# Patient Record
Sex: Male | Born: 1940 | Race: White | Hispanic: No | Marital: Married | Smoking: Former smoker
Health system: Southern US, Community
[De-identification: ages and names within clinical notes are randomized; demographics above are authoritative.]

## PROBLEM LIST (undated history)

## (undated) DIAGNOSIS — E7800 Pure hypercholesterolemia, unspecified: Secondary | ICD-10-CM

## (undated) DIAGNOSIS — E119 Type 2 diabetes mellitus without complications: Secondary | ICD-10-CM

## (undated) DIAGNOSIS — I10 Essential (primary) hypertension: Secondary | ICD-10-CM

## (undated) HISTORY — PX: EYE SURGERY: SHX253

## (undated) HISTORY — PX: CATARACT EXTRACTION: SUR2

---

## 2017-12-11 ENCOUNTER — Encounter: Payer: Self-pay | Admitting: *Deleted

## 2017-12-12 ENCOUNTER — Encounter: Payer: Self-pay | Admitting: *Deleted

## 2017-12-12 ENCOUNTER — Encounter
Admission: RE | Disposition: A | Discharge status: Home / Self Care | Payer: Self-pay | Source: Ambulatory Visit | Attending: Internal Medicine

## 2017-12-12 ENCOUNTER — Ambulatory Visit: Payer: Medicare Other | Admitting: Anesthesiology

## 2017-12-12 ENCOUNTER — Ambulatory Visit
Admission: RE | Admit: 2017-12-12 | Discharge: 2017-12-12 | Disposition: A | Discharge status: Home / Self Care | Payer: Medicare Other | Source: Ambulatory Visit | Attending: Internal Medicine | Admitting: Internal Medicine

## 2017-12-12 DIAGNOSIS — E119 Type 2 diabetes mellitus without complications: Secondary | ICD-10-CM | POA: Insufficient documentation

## 2017-12-12 DIAGNOSIS — K5790 Diverticulosis of intestine, part unspecified, without perforation or abscess without bleeding: Secondary | ICD-10-CM | POA: Insufficient documentation

## 2017-12-12 DIAGNOSIS — Z7982 Long term (current) use of aspirin: Secondary | ICD-10-CM | POA: Insufficient documentation

## 2017-12-12 DIAGNOSIS — D123 Benign neoplasm of transverse colon: Secondary | ICD-10-CM | POA: Insufficient documentation

## 2017-12-12 DIAGNOSIS — Z79899 Other long term (current) drug therapy: Secondary | ICD-10-CM | POA: Insufficient documentation

## 2017-12-12 DIAGNOSIS — Z86010 Personal history of colonic polyps: Secondary | ICD-10-CM | POA: Insufficient documentation

## 2017-12-12 DIAGNOSIS — I10 Essential (primary) hypertension: Secondary | ICD-10-CM | POA: Diagnosis not present

## 2017-12-12 DIAGNOSIS — K640 First degree hemorrhoids: Secondary | ICD-10-CM | POA: Diagnosis not present

## 2017-12-12 DIAGNOSIS — E7800 Pure hypercholesterolemia, unspecified: Secondary | ICD-10-CM | POA: Diagnosis not present

## 2017-12-12 DIAGNOSIS — Z1211 Encounter for screening for malignant neoplasm of colon: Secondary | ICD-10-CM | POA: Insufficient documentation

## 2017-12-12 DIAGNOSIS — Z7984 Long term (current) use of oral hypoglycemic drugs: Secondary | ICD-10-CM | POA: Insufficient documentation

## 2017-12-12 HISTORY — DX: Type 2 diabetes mellitus without complications: E11.9

## 2017-12-12 HISTORY — DX: Pure hypercholesterolemia, unspecified: E78.00

## 2017-12-12 HISTORY — PX: COLONOSCOPY WITH PROPOFOL: SHX5780

## 2017-12-12 HISTORY — DX: Essential (primary) hypertension: I10

## 2017-12-12 LAB — GLUCOSE, CAPILLARY: Glucose-Capillary: 160 mg/dL — ABNORMAL HIGH (ref 65–99)

## 2017-12-12 SURGERY — COLONOSCOPY WITH PROPOFOL
Anesthesia: General

## 2017-12-12 MED ORDER — PROPOFOL 500 MG/50ML IV EMUL
INTRAVENOUS | Status: DC | PRN
Start: 2017-12-12 — End: 2017-12-12
  Administered 2017-12-12: 200 ug/kg/min via INTRAVENOUS

## 2017-12-12 MED ORDER — SODIUM CHLORIDE 0.9 % IV SOLN
INTRAVENOUS | Status: DC
  Administered 2017-12-12: 09:00:00 via INTRAVENOUS

## 2017-12-12 MED ORDER — PHENYLEPHRINE HCL 10 MG/ML IJ SOLN
INTRAMUSCULAR | Status: DC | PRN
  Administered 2017-12-12: 200 ug via INTRAVENOUS

## 2017-12-12 MED ORDER — SODIUM CHLORIDE 0.9 % IV SOLN
INTRAVENOUS | Status: DC | PRN
  Administered 2017-12-12: 09:00:00 via INTRAVENOUS

## 2017-12-12 MED ORDER — PROPOFOL 10 MG/ML IV BOLUS
INTRAVENOUS | Status: DC | PRN
  Administered 2017-12-12: 100 mg via INTRAVENOUS

## 2017-12-12 MED ORDER — PROPOFOL 10 MG/ML IV BOLUS
INTRAVENOUS | Status: AC
  Filled 2017-12-12: qty 40

## 2017-12-12 MED ORDER — LIDOCAINE HCL (CARDIAC) PF 100 MG/5ML IV SOSY
PREFILLED_SYRINGE | INTRAVENOUS | Status: DC | PRN
  Administered 2017-12-12: 8 mg via INTRAVENOUS

## 2017-12-12 MED ORDER — EPHEDRINE SULFATE 50 MG/ML IJ SOLN
INTRAMUSCULAR | Status: DC | PRN
  Administered 2017-12-12: 10 mg via INTRAVENOUS

## 2017-12-12 NOTE — Interval H&P Note (Signed)
History and Physical Interval Note:  12/12/2017 9:01 AM  Michael Cohen  has presented today for surgery, with the diagnosis of [place]  The various methods of treatment have been discussed with the patient and family. After consideration of risks, benefits and other options for treatment, the patient has consented to  Procedure(s): COLONOSCOPY WITH PROPOFOL (N/A) as a surgical intervention .  The patient's history has been reviewed, patient examined, no change in status, stable for surgery.  I have reviewed the patient's chart and labs.  Questions were answered to the patient's satisfaction.     [place], [place]

## 2017-12-12 NOTE — H&P (Signed)
Outpatient short stay form Pre-procedure 12/12/2017 8:58 AM Teodoro K. Alice Reichert, M.D.  Primary Physician: Frazier Richards, M.D.  Reason for visit:  Personal hx of colon polyps.  History of present illness:  Patient presents for colon  polyp surveillance. The patient denies abdominal pain, abnormal weight loss or rectal bleeding.    No current facility-administered medications for this encounter.   Medications Prior to Admission  Medication Sig Dispense Refill Last Dose  . acetaminophen (TYLENOL) 325 MG tablet Take 650 mg by mouth every 6 (six) hours as needed.   Past Week at Unknown time  . aspirin EC 81 MG tablet Take 81 mg by mouth daily.   Past Week at Unknown time  . atorvastatin (LIPITOR) 40 MG tablet Take 40 mg by mouth daily.   Past Week at Unknown time  . cetirizine (ZYRTEC) 10 MG tablet Take 10 mg by mouth daily.   Past Week at Unknown time  . cholecalciferol (VITAMIN D) 400 units TABS tablet Take 2,000 Units by mouth daily.   Past Week at Unknown time  . lisinopril-hydrochlorothiazide (PRINZIDE,ZESTORETIC) 10-12.5 MG tablet Take 1 tablet by mouth daily.   Past Week at Unknown time  . metFORMIN (GLUCOPHAGE-XR) 500 MG 24 hr tablet Take 500 mg by mouth daily with breakfast.   Past Week at Unknown time  . Multiple Vitamin (MULTIVITAMIN) capsule Take 1 capsule by mouth daily.   Past Week at Unknown time  . multivitamin-lutein (OCUVITE-LUTEIN) CAPS capsule Take 6 capsules by mouth daily.   Past Week at Unknown time  . Omega-3 Fatty Acids (FISH OIL) 1000 MG CAPS Take 1,000 mg by mouth 1 day or 1 dose.   Past Week at Unknown time  . omeprazole (PRILOSEC) 20 MG capsule Take 20 mg by mouth daily.   Past Week at Unknown time  . sitaGLIPtin (JANUVIA) 100 MG tablet Take 50 mg by mouth daily.   12/12/2017 at 0630  . UNABLE TO FIND Take by mouth once.        No Known Allergies   Past Medical History:  Diagnosis Date  . Diabetes mellitus without complication ([place])   . Hypercholesteremia    . Hypertension     Review of systems:   Otherwise negative.   Physical Exam  Gen: Alert, oriented. Appears stated age.  HEENT: [place]/AT. PERRLA. Lungs: CTA, no wheezes. CV: RR nl S1, S2. Abd: soft, benign, no masses. BS+ Ext: No edema. Pulses 2+    Planned procedures: Proceed with colonoscopy. The patient understands the nature of the planned procedure, indications, risks, alternatives and potential complications including but not limited to bleeding, infection, perforation, damage to internal organs and possible oversedation/side effects from anesthesia. The patient agrees and gives consent to proceed.  Please refer to procedure notes for findings, recommendations and patient disposition/instructions.    Teodoro K. Alice Reichert, M.D. Gastroenterology 12/12/2017  8:58 AM

## 2017-12-12 NOTE — Anesthesia Post-op Follow-up Note (Signed)
Anesthesia QCDR form completed.        

## 2017-12-12 NOTE — Op Note (Signed)
[facility] Gastroenterology Patient Name: Michael Cohen Procedure Date: 12/12/2017 8:50 AM MRN: [id number] Account #: [id number] Date of Birth: 02/23/1941 Admit Type: Outpatient Age: 77 Room: [facility] ENDO ROOM 2 Gender: Male Note Status: Finalized Procedure:            Colonoscopy Indications:          Surveillance: Personal history of adenomatous polyps on                        last colonoscopy > 5 years ago Providers:            Lorie Apley K. [name] MD, MD Medicines:            Propofol per Anesthesia Complications:        No immediate complications. Procedure:            Pre-Anesthesia Assessment:                       - The risks and benefits of the procedure and the                        sedation options and risks were discussed with the                        patient. All questions were answered and informed                        consent was obtained.                       - Patient identification and proposed procedure were                        verified prior to the procedure by the nurse. The                        procedure was verified in the procedure room.                       - ASA Grade Assessment: III - A patient with severe                        systemic disease.                       - After reviewing the risks and benefits, the patient                        was deemed in satisfactory condition to undergo the                        procedure.                       After obtaining informed consent, the colonoscope was                        passed under direct vision. Throughout the procedure,                        the patient's blood pressure, pulse, and oxygen  saturations were monitored continuously. The                        Colonoscope was introduced through the anus and                        advanced to the the cecum, identified by appendiceal                        orifice and ileocecal valve. The colonoscopy was                         performed without difficulty. The patient tolerated the                        procedure well. The quality of the bowel preparation                        was good. The ileocecal valve, appendiceal orifice, and                        rectum were photographed. Findings:      The perianal and digital rectal examinations were normal. Pertinent       negatives include normal sphincter tone and no palpable rectal lesions.      Multiple small and large-mouthed diverticula were found in the entire       colon. There was no evidence of diverticular bleeding.      A 3 mm polyp was found in the transverse colon. The polyp was sessile.       The polyp was removed with a jumbo cold forceps. Resection and retrieval       were complete.      A 6 mm polyp was found in the transverse colon. The polyp was       semi-pedunculated. The polyp was removed with a hot snare. Resection and       retrieval were complete.      Non-bleeding internal hemorrhoids were found during retroflexion. The       hemorrhoids were Grade I (internal hemorrhoids that do not prolapse).      The exam was otherwise without abnormality. Impression:           - Moderate diverticulosis in the entire examined colon.                        There was no evidence of diverticular bleeding.                       - One 3 mm polyp in the transverse colon, removed with                        a jumbo cold forceps. Resected and retrieved.                       - One 6 mm polyp in the transverse colon, removed with                        a hot snare. Resected and retrieved.                       -  Non-bleeding internal hemorrhoids.                       - The examination was otherwise normal. Recommendation:       - Patient has a contact number available for                        emergencies. The signs and symptoms of potential                        delayed complications were discussed with the patient.                         Return to normal activities tomorrow. Written discharge                        instructions were provided to the patient.                       - Resume previous diet.                       - Continue present medications.                       - No repeat colonoscopy due to age.                       - The findings and recommendations were discussed with                        the patient and their spouse. Procedure Code(s):    --- Professional ---                       [phone], Colonoscopy, flexible; with removal of tumor(s),                        polyp(s), or other lesion(s) by snare technique                       45380, 58, Colonoscopy, flexible; with biopsy, single                        or multiple Diagnosis Code(s):    --- Professional ---                       K57.30, Diverticulosis of large intestine without                        perforation or abscess without bleeding                       D12.3, Benign neoplasm of transverse colon (hepatic                        flexure or splenic flexure)                       K64.0, First degree hemorrhoids                       Z86.010, Personal history of colonic polyps  CPT copyright 2017 American Medical Association. All rights reserved. The codes documented in this report are preliminary and upon coder review may  be revised to meet current compliance requirements. Efrain Sella MD, MD 12/12/2017 9:52:03 AM This report has been signed electronically. Number of Addenda: 0 Note Initiated On: 12/12/2017 8:50 AM Scope Withdrawal Time: 0 hours 6 minutes 26 seconds  Total Procedure Duration: 0 hours 12 minutes 49 seconds       [facility]

## 2017-12-12 NOTE — Transfer of Care (Signed)
Immediate Anesthesia Transfer of Care Note  Patient: Michael Cohen  Procedure(s) Performed: COLONOSCOPY WITH PROPOFOL (N/A )  Patient Location: PACU  Anesthesia Type:General  Level of Consciousness: awake, alert  and oriented  Airway & Oxygen Therapy: Patient Spontanous Breathing and Patient connected to nasal cannula oxygen  Post-op Assessment: Report given to RN and Post -op Vital signs reviewed and stable  Post vital signs: Reviewed and stable  Last Vitals:  Vitals Value Taken Time  BP 104/55 12/12/2017  9:51 AM  Temp    Pulse 65 12/12/2017  9:51 AM  Resp 19 12/12/2017  9:51 AM  SpO2 96 % 12/12/2017  9:51 AM  Vitals shown include unvalidated device data.  Last Pain:  Vitals:   12/12/17 0855  TempSrc: Tympanic  PainSc: 0-No pain         Complications: No apparent anesthesia complications

## 2017-12-12 NOTE — Anesthesia Preprocedure Evaluation (Signed)
  Anesthesia Plan  ASA: III  Anesthesia Plan: General   Post-op Pain Management:    Induction:   PONV Risk Score and Plan:   Airway Management Planned:   Additional Equipment:   Intra-op Plan:   Post-operative Plan:   Informed Consent: I have reviewed the patients History and Physical, chart, labs and discussed the procedure including the risks, benefits and alternatives for the proposed anesthesia with the patient or authorized representative who has indicated his/her understanding and acceptance.     Dental Advisory Given  Plan Discussed with: CRNA  Anesthesia Plan Comments:         Anesthesia Quick Evaluation  

## 2017-12-12 NOTE — Anesthesia Postprocedure Evaluation (Signed)
Anesthesia Post Note  Patient: Michael Cohen  Procedure(s) Performed: COLONOSCOPY WITH PROPOFOL (N/A )  Patient location during evaluation: PACU Anesthesia Type: General Level of consciousness: awake and alert Pain management: pain level controlled Vital Signs Assessment: post-procedure vital signs reviewed and stable Respiratory status: spontaneous breathing, nonlabored ventilation, respiratory function stable and patient connected to nasal cannula oxygen Cardiovascular status: blood pressure returned to baseline and stable Postop Assessment: no apparent nausea or vomiting Anesthetic complications: no     Last Vitals:  Vitals:   12/12/17 1010 12/12/17 1020  BP: 95/63 94/64  Pulse: 68 62  Resp: 15 18  Temp:    SpO2: 93% 96%    Last Pain:  Vitals:   12/12/17 0940  TempSrc: Tympanic  PainSc:                  Molli Barrows

## 2017-12-13 ENCOUNTER — Encounter: Payer: Self-pay | Admitting: Internal Medicine

## 2017-12-13 LAB — SURGICAL PATHOLOGY

## 2020-01-14 ENCOUNTER — Ambulatory Visit: Payer: Medicare Other | Attending: Internal Medicine

## 2020-01-14 ENCOUNTER — Encounter: Payer: Self-pay | Admitting: Physical Therapy

## 2020-01-14 ENCOUNTER — Other Ambulatory Visit: Payer: Self-pay

## 2020-01-14 DIAGNOSIS — M25551 Pain in right hip: Secondary | ICD-10-CM | POA: Insufficient documentation

## 2020-01-14 NOTE — Therapy (Signed)
[place] MAIN [facility] SERVICES [address] [place], [place], [address] Phone: [phone]   Fax:  [phone]  Physical Therapy Evaluation  Patient Details  Name: Michael Cohen MRN: [id number] Date of Birth: 1940-09-18 Referring Provider (PT): Dr. Ouida Sills   Encounter Date: 01/14/2020   PT End of Session - 01/14/20 1452    Visit Number 1    Number of Visits 1    Date for PT Re-Evaluation 01/21/20    Authorization Type One time visit, eval only on 01/14/20    PT Start Time 1350    PT Stop Time 1440    PT Time Calculation (min) 50 min    Activity Tolerance Patient tolerated treatment well    Behavior During Therapy [facility] for tasks assessed/performed           Past Medical History:  Diagnosis Date  . Diabetes mellitus without complication ([place])   . Hypercholesteremia   . Hypertension     Past Surgical History:  Procedure Laterality Date  . CATARACT EXTRACTION Bilateral   . COLONOSCOPY WITH PROPOFOL N/A 12/12/2017   Procedure: COLONOSCOPY WITH PROPOFOL;  Surgeon: [name], [name], MD;  Location: [facility] ENDOSCOPY;  Service: Gastroenterology;  Laterality: N/A;  . EYE SURGERY      There were no vitals filed for this visit.    Subjective Assessment - 01/14/20 1448    Subjective R hip pain    Pertinent History Pt reports that 2-3 months ago he started having posterolateral R hip pain. He saw his PCP who encouraged him to take 2 Tylenol and 2 Ibuprofen twice per day for a couple days. He had plain films of his R hip and pt states that they were normal however they are not available for therapist to review at this time. His hip pain improved significantly a few days after seeing his PCP and has continued to improve. However, he continues to have some mild pain in the morning. He also reports some R calf tightness/pain in the morning on the medial side down to his posterior medial malleolus. He has a history of what sounds like plantar fasica  injury over 20 years ago.    Diagnostic tests Plain films which pt reports were WNL however not available for therapist to review    Patient Stated Goals Decrease R hip pain    Currently in Pain? No/denies   Worst: 6/10, Best: 0/10   Pain Score 0-No pain    Pain Location Hip    Pain Orientation Right;Posterior;Lateral    Pain Descriptors / Indicators --   "intense stiffness"   Pain Type Chronic pain    Pain Radiating Towards None    Pain Onset More than a month ago    Pain Frequency Intermittent    Aggravating Factors  Hurts more in the morning after sleeping    Pain Relieving Factors Improved as day progresses and after walking. Has improved since pt returned to group exercise classes at [place] Chief complaint:  R hip pain Referring Dx: Pt reports that 2-3 months ago he started having posterolateral R hip pain. He saw his PCP who encouraged him to take 2 Tylenol and 2 Ibuprofen twice per day for a couple days. He had plain films of his R hip and pt states that they were normal however they are not available for therapist to review at this time. His hip pain improved  significantly a few days after seeing his PCP and has continued to improve. However, he continues to have some mild pain in the morning. He also reports some R calf tightness/pain in the morning on the medial side down to his posterior medial malleolus. He has a history of what sounds like plantar fasica injury over 20 years ago.  MD: Dr. Ouida Sills Pain: 0/10 Present, 0/10 Best, 6/10 Worst: Aggravating factors: Unknown Easing factors: Improves as day progresses following activity 24 hour pain behavior: Increased pain in the morning Prior history of hip injury or pain: No, he also denies history of back pain; Pain quality: pain quality: "Intense stiffness" Sleeping position: Stomach or L side Radiating pain: No  Numbness/Tingling: No Dominant hand: right Hobbies: Walking the dog, playing on  the computer Goals: Improve pain Red flags (bowel/bladder changes, saddle paresthesia, personal history of cancer, chills/fever, night sweats, unrelenting pain); Negative    OBJECTIVE  Mental Status Patient is oriented to person, place and time.  Recent memory is intact.  Remote memory is intact.  Attention span and concentration are intact.  Expressive speech is intact.  Patient's fund of knowledge is within normal limits for educational level.  SENSATION: Pt denies any numbness tingling in BLE   MUSCULOSKELETAL: Tremor: None Bulk: Normal Tone: Normal  Posture Lumbar lordosis: Flat low back Lumbar lateral shift: negative  Palpation No pain to palpation at lateral and posterior R hip. Pt localizes pain to just superior to greater trochanter of R hip but it has never been painful to palpation ;   Strength (out of 5) R/L 4+/4+ Hip flexion 4+/4+ Hip ER 4+ (stretch in posterior R hip)/4+ Hip IR 4-/4- Hip abduction 4/4 Hip adduction 4-/4- Hip extension 5/5 Knee extension 5/5 Knee flexion 5/5 Ankle dorsiflexion *Indicates pain   AROM (degrees) R/L (all movements include overpressure unless otherwise stated) Lumbar forward flexion (65): mild/mod limitation Lumbar extension (30): mod limitation Lumbar lateral flexion (25): R: mod limitation  L: mod limitation Thoracic and Lumbar rotation (30 degrees): Mild limitation bilaterally Hip IR (0-45): Mild limitation bil Hip ER (0-45): Mild limitation bil Hip Flexion (0-125): WNL and painfree bilaterally *Indicates pain  Repeated Movements No centralization or peripheralization of symptoms with repeated lumbar extension or flexion.    Muscle Length Hamstrings: Grossly 55-60 degrees bilaterally, no reproduction of pain;     SPECIAL TESTS Lumbar Radiculopathy and Discogenic: Centralization and Peripheralization (SN 92, -LR 0.12): Negative Slump (SN 83, -LR 0.32): R: Negative L: Negative SLR (SN 92, -LR 0.29): R:  Negative L:  Negative Crossed SLR (SP 90): R: Negative L: Negative  Facet Joint: Extension-Rotation (SN 100, -LR 0.0): R: Negative L: Negative  Lumbar Spinal Stenosis: Lumbar quadrant (SN 70): R: Negative L: Negative  Hip: FABER (SN 81): R: Positive L: Negative FADIR (SN 94): R: Positive L: Negative Hip scour (SN 50): R: Negative L: Negative  SIJ:  Thigh Thrust (SN 88, -LR 0.18) : R: Not done L: Not done  Piriformis Syndrome: FAIR Test (SN 88, SP 83): R: Negative L: Not examined  Forward Step-Down Test: R: Not examined L: Not examined Lateral Step-Down Test: R: Not examined L: Not examined Deep Squat: Not examined          Objective measurements completed on examination: See above findings.               PT Education - 01/14/20 1452    Education Details Plan of care and HEP    Person(s) Educated Patient  Methods Explanation;Demonstration;Handout    Comprehension Verbalized understanding               PT Long Term Goals - 01/14/20 1502      PT LONG TERM GOAL #1   Title Pt will be independent with HEP in order to improve strength and decrease R hip pain in order to improve pain-free function at home and with leisure activities    Time 1    Period Weeks    Status Achieved    Target Date 01/21/20                  Plan - 01/14/20 1443    Clinical Impression Statement Pt is a pleasant 79 year-old male referred for R hip pain. Pt reports significant resolution of pain following his PCP visit and after 2 days of Tylenol/Ibuprofen combination. He has also started back with his group exercise classes at [facility] which has helped significantly. Examination today is relatively benign with some mild reproduction of pain in R hip in both FABER and FADIR positions. Lumbar screen is clear and no radicular symptoms present. Pt is not painful to palpation of hip. He reports his pain occurs near the superior border of the greater trochanter and has some  weakness of bilateral hips in abduction, adduction, and extension. Pain is likely related to some form of gluteal tendinopathy/trochanteric bursitis. Pt provided with gentle stretches today for hip IR/ER as well as light isometric strengthening. Encouraged pillow between the knees during L sidelying position at night. Encouraged continued conditioning and flexibility training in group settings at [facility]. Pt does not feel it necessary to continue with formal PT and he will be discharged on this date to continue independently. If symptoms worsen or adequate resolution not achieved he will return for additional therapy.    Personal Factors and Comorbidities Time since onset of injury/illness/exacerbation;Comorbidity 1    Comorbidities DM    Examination-Activity Limitations Locomotion Level    Examination-Participation Restrictions Community Activity;Other   Exercise   Stability/Clinical Decision Making Stable/Uncomplicated    Clinical Decision Making Low    Rehab Potential Excellent    PT Frequency One time visit    PT Duration Other (comment)   1 week   PT Treatment/Interventions Aquatic Therapy;Canalith Repostioning;Cryotherapy;Electrical Stimulation;Iontophoresis 4mg /ml Dexamethasone;Moist Heat;Traction;Ultrasound;DME Instruction;Gait training;Stair training;Functional mobility training;Therapeutic activities;Therapeutic exercise;Balance training;Neuromuscular re-education;Patient/family education;Manual techniques;Passive range of motion;Spinal Manipulations;Joint Manipulations    PT Next Visit Plan Discharge    PT Home Exercise Plan Medbridge Access Code: [email or web address]    Consulted and Agree with Plan of Care Patient           Patient will benefit from skilled therapeutic intervention in order to improve the following deficits and impairments:  Pain  Visit Diagnosis: Pain in right hip - Plan: PT plan of care cert/re-cert     Problem List There are no problems to display for this  patient.  Phillips Grout PT, DPT, GCS  Jasneet Schobert 01/14/2020, 3:07 PM  [place] MAIN [facility] SERVICES [address] [place], [place], [address] Phone: [phone]   Fax:  [phone]  Name: Michael Cohen MRN: [id number] Date of Birth: December 21, 1940

## 2020-01-14 NOTE — Patient Instructions (Signed)
Access Code: [email or web address] URL: [email or web address] Date: 01/14/2020 Prepared by: Roxana Hires  Exercises Seated Figure 4 Piriformis Stretch - 2 x daily - 7 x weekly - 3 reps - 30-45s hold Seated Piriformis Stretch - 2 x daily - 7 x weekly - 3 reps - 30-45s hold Hooklying Bilateral Isometric Clamshell - 2 x daily - 7 x weekly - 3 reps - 45s hold

## 2020-07-06 ENCOUNTER — Ambulatory Visit: Payer: Medicare Other | Attending: Podiatry | Admitting: Physical Therapy

## 2020-07-06 ENCOUNTER — Other Ambulatory Visit: Payer: Self-pay

## 2020-07-06 VITALS — BP 121/61 | HR 88

## 2020-07-06 DIAGNOSIS — M25551 Pain in right hip: Secondary | ICD-10-CM | POA: Diagnosis present

## 2020-07-06 DIAGNOSIS — M6281 Muscle weakness (generalized): Secondary | ICD-10-CM

## 2020-07-06 DIAGNOSIS — R2689 Other abnormalities of gait and mobility: Secondary | ICD-10-CM

## 2020-07-06 NOTE — Therapy (Signed)
[place] MAIN [facility] SERVICES [address] [place], [place], [address] Phone: [phone]   Fax:  [phone]  Physical Therapy Evaluation  Patient Details  Name: Travers Goodley MRN: [id number] Date of Birth: [birth year] Referring Provider (PT): [name]   Encounter Date: 07/06/2020   PT End of Session - 07/06/20 1503    Visit Number 1    Number of Visits 17    Date for PT Re-Evaluation 08/31/20    Authorization Type eval: 07/06/20    PT Start Time 1430    PT Stop Time 1530    PT Time Calculation (min) 60 min    Activity Tolerance Patient tolerated treatment well    Behavior During Therapy [facility] for tasks assessed/performed           Past Medical History:  Diagnosis Date  . Diabetes mellitus without complication ([place])   . Hypercholesteremia   . Hypertension     Past Surgical History:  Procedure Laterality Date  . CATARACT EXTRACTION Bilateral   . COLONOSCOPY WITH PROPOFOL N/A 12/12/2017   Procedure: COLONOSCOPY WITH PROPOFOL;  Surgeon: [name], [name], MD;  Location: [facility] ENDOSCOPY;  Service: Gastroenterology;  Laterality: N/A;  . EYE SURGERY      Vitals:   07/06/20 1454  BP: 121/61  Pulse: 88  SpO2: 96%      Subjective Assessment - 07/06/20 1441    Subjective Patient reports of hip feeling worse in the morning. He always takes a hot shower to loosen up the hip before going with his day.    Pertinent History 79 y.o. 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 His pain began around a month ago and is located over the proximal lateral thigh and in the right buttocks. He describes his pain as achy more than sharp. He is not sure of any aggravating activities. He reports of having some pain at rest today. He reports associated pain or "tightness" felt along anterior and lateral portions of the lower leg. He was previously evaluated by [name] in podiatry and  diagnosed with tibialis tendinitis. He denies associated groin pain, denies radicular symptoms. He has tried Baclofen without any relief of his symptoms. Of note, the patient states that he was scheduled to go on a cruise this week but was canceled due to the pain he is experiencing. He states that he would be unable to walk around and participate fully in his vacation given his pain. An anteroposterior view of the pelvis and anteroposterior and lateral views of the right hip were obtained. Images reveal mild loss of femoral acetabular joint space with mild osteophyte formation noted. CAM deformity of the right hip noted. No fractures or dislocations. Anteroposterior, oblique, and lateral views of the lumbar spine were obtained. Images reveal no scoliosis. Mild loss of lumbar lordosis noted. Significant loss of disc space noted at L4-L5 and L5-S1. Intervertebral osteophyte formation of L4 and L5. No fractures or wedging noted. Calcification of the abdominal aorta noted. Degeneration of the lumbar facets noted. Patient was recently diagnosed with lumbar radiculoapthy due to symptoms being more consistent vs hip pathology given the absence of hip symptoms with provocative maneuvers. Prednisone tapers have been considered but is on hold for now due to patient's diabetes.    Limitations Walking;Standing;Lifting;Sitting    How long can you sit comfortably? Few hours    How long can you stand comfortably? 30 mins  How long can you walk comfortably? 1 hour    Currently in Pain? Yes    Pain Score 3     Pain Location Leg    Pain Orientation Right    Pain Descriptors / Indicators Aching    Pain Type Chronic pain    Pain Radiating Towards medial lower leg    Pain Onset More than a month ago    Pain Frequency Constant    Pain Relieving Factors Gabapentin, heat              [facility] PT Assessment - 07/06/20 1448      Assessment   Medical Diagnosis Lumbar radiculopathy    Referring Provider (PT) [name]     Hand Dominance Right      Precautions   Precautions None      Restrictions   Weight Bearing Restrictions No    Other Position/Activity Restrictions lower extremity function      Balance Screen   Has the patient fallen in the past 6 months No    Has the patient had a decrease in activity level because of a fear of falling?  Yes    Is the patient reluctant to leave their home because of a fear of falling?  Yes      [place] Private residence    Living Arrangements Spouse/significant other    Available Help at Discharge Family;Friend(s)    Type of [place] Level entry    [place];Able to live on main level with bedroom/bathroom    Home Equipment Grab bars - tub/shower      Prior Function   Level of Independence Independent      Observation/Other Assessments   Focus on Therapeutic Outcomes (FOTO)  57    Other Surveys  Lower Extremity Functional Scale    Lower Extremity Functional Scale  50/80      Transfers   Five time sit to stand comments  17.65      6 Minute Walk- Baseline   6 Minute Walk- Baseline yes    BP (mmHg) 121/61    HR (bpm) 88    02 Sat (%RA) 96 %      6 Minute walk- Post Test   6 Minute Walk Post Test yes    BP (mmHg) 127/62    HR (bpm) 88    02 Sat (%RA) 95 %    Perceived Rate of Exertion (Borg) 11- Fairly light      6 minute walk test results    Aerobic Endurance Distance Walked 500      Balance   Balance Assessed Yes      Standardized Balance Assessment   Standardized Balance Assessment Timed Up and Go Test;Five Times Sit to Stand      Timed Up and Go Test   Normal TUG (seconds) 16.78            SUBJECTIVE Chief complaint: Right hip stiffness and pain Referring Dx: Lumbar radiculopathy Referring Provider: [name] Pain location: R greater trochanter radiating to anterior medial tibia Pain quality: aching Radiating pain: Yes Numbness/Tingling:  No How long can you sit: 3-4 hours How long can you stand: 30-45 min How long can you walk: 1 hour History of back injury, pain, surgery, or therapy: No Follow-up appointment with MD: Not known yet Dominant hand: Right Imaging: Yes; (+) CAM deformity, signficant loss of disc space noted at L4-L5 and L5-S1  Falls in the last 6 months: No Occupational demands: Retired [employer] flags (bowel/bladder changes, saddle paresthesia, personal history of cancer, chills/fever, night sweats, unrelenting pain, first onset of insidious LBP <20 y/o) Negative    OBJECTIVE  Mental Status Patient is oriented to person, place and time.  Recent memory is intact.  Remote memory is intact.  Attention span and concentration are intact.  Expressive speech is intact.  Patient's fund of knowledge is within normal limits for educational level.  SENSATION: Grossly intact to light touch bilateral LEs as determined by testing dermatomes L2-S2 Proprioception and hot/cold testing deferred on this date   MUSCULOSKELETAL: Tremor: None Bulk: Normal Tone: Normal    Strength (out of 5) R/L 4/5 Hip flexion 4-/5 Hip ER 4-/5 Hip IR 4-/5 Hip abduction 4-/5 Hip adduction 3+/5 Hip extension 4+/5 Knee extension 4+/5 Knee flexion 4+/5 Ankle dorsiflexion 4+/5 Ankle plantarflexion    AROM (degrees) All AROM were WNL of BLE.    Repeated Movements No centralization or peripheralization of symptoms with repeated lumbar extension or flexion.     Outcome Measures LEFS: 50/80 TUG: 16.78 seconds 5STS: 17.65 seconds 6MWT: 500 ft  FOTO: 57/100   ASSESSMENT 79 y.o. male presents to skilled physical therapy referred for signs and symptoms consistent with lumbar radiculopathy with imaging to confirm diagnosis. PT examination reveals deficits in balance, strength, and mobility as evidenced by TUG score of 16.78, 4/5 on MMT scale when testing RLE strength, and ambulating 500 ft during the six minute walk test. Patient  presents with deficits in strength, mobility, range of motion, and pain. Patient will benefit from skilled PT services to address deficits and return to pain-free function at home.       PT Short Term Goals - 07/06/20 1736      PT SHORT TERM GOAL #1   Title Patient will be independent in home exercise program to improve strength/mobility for better functional independence with ADLs.    Time 4    Period Weeks    Status New    Target Date 08/03/20      PT SHORT TERM GOAL #2   Title Patient will increase RLE gross strength to 4+/5 as to improve functional strength for independent gait, increased standing tolerance and increased ADL ability.    Baseline 12/21: RLE gross strength 4/5    Time 4    Period Weeks    Status New    Target Date 08/03/20             PT Long Term Goals - 07/06/20 1738      PT LONG TERM GOAL #1   Title Patient will increase FOTO score to equal to or greater than 67 to demonstrate statistically significant improvement in mobility and quality of life.    Baseline 12/21: 57    Time 8    Period Weeks    Status New    Target Date 08/31/20      PT LONG TERM GOAL #2   Title Patient (> 63 years old) will complete five times sit to stand test in < 15 seconds indicating an increased LE strength and improved balance.    Baseline 12/21: 17.65s    Time 8    Period Weeks    Status New    Target Date 08/31/20      PT LONG TERM GOAL #3   Title Patient will increase six minute walk test distance to >1000 for progression to community ambulator and improve gait ability  Baseline 12/21: 500    Time 8    Period Weeks    Status New    Target Date 08/31/20      PT LONG TERM GOAL #4   Title Patient will reduce timed up and go to <11 seconds to reduce fall risk and demonstrate improved transfer/gait ability.    Baseline 12/21: 16.78s    Time 8    Period Weeks    Status New    Target Date 08/31/20      PT LONG TERM GOAL #5   Title Patient will increase lower  extremity functional scale to >60/80 to demonstrate improved functional mobility and increased tolerance with ADLs.    Baseline 12/21: 50/80    Time 8    Period Weeks    Status New    Target Date 08/31/20                  Plan - 07/06/20 1506    Clinical Impression Statement 79 y.o. male presents to skilled physical therapy referred for signs and symptoms consistent with lumbar radiculopathy with imaging to confirm diagnosis. PT examination reveals deficits in balance, strength, and mobility as evidenced by TUG score of 16.78, 4/5 on MMT scale when testing RLE strength, and ambulating 500 ft during the six minute walk test. Patient presents with deficits in strength, mobility, range of motion, and pain. Patient will benefit from skilled PT services to address deficits and return to pain-free function at home.    Personal Factors and Comorbidities Comorbidity 3+;Time since onset of injury/illness/exacerbation    Comorbidities diabetes, hypertension, hyperlipidemia    Examination-Activity Limitations Squat;Stairs;Transfers    Examination-Participation Restrictions Church    Stability/Clinical Decision Making Evolving/Moderate complexity    Clinical Decision Making Moderate    Rehab Potential Fair    PT Frequency 2x / week    PT Duration 8 weeks    PT Treatment/Interventions Moist Heat;Electrical Stimulation;Gait training;Stair training;Functional mobility training;Therapeutic activities;Therapeutic exercise;Balance training;Neuromuscular re-education;Patient/family education;Manual techniques;Passive range of motion;Energy conservation    PT Next Visit Plan Initiate BLE strengthening and balance exercises    PT Home Exercise Plan HEP    Consulted and Agree with Plan of Care Patient           Patient will benefit from skilled therapeutic intervention in order to improve the following deficits and impairments:  Abnormal gait,Decreased endurance,Impaired sensation,Decreased activity  tolerance,Decreased strength,Decreased balance,Decreased mobility,Difficulty walking,Decreased range of motion,Decreased safety awareness,Improper body mechanics  Visit Diagnosis: Muscle weakness (generalized)  Pain in right hip  Other abnormalities of gait and mobility     Problem List There are no problems to display for this patient.  Karl Luke PT, DPT Onalee Hua Suszanne Conners 07/06/2020, 6:05 PM  [place] MAIN [facility] SERVICES [address] [place], [place], [address] Phone: [phone]   Fax:  [phone]  Name: Briant Angelillo MRN: [id number] Date of Birth: 05-Jul-1941

## 2020-07-12 ENCOUNTER — Other Ambulatory Visit: Payer: Self-pay

## 2020-07-12 ENCOUNTER — Ambulatory Visit: Payer: Medicare Other | Admitting: Physical Therapy

## 2020-07-12 DIAGNOSIS — M6281 Muscle weakness (generalized): Secondary | ICD-10-CM

## 2020-07-12 DIAGNOSIS — R2689 Other abnormalities of gait and mobility: Secondary | ICD-10-CM

## 2020-07-12 DIAGNOSIS — M25551 Pain in right hip: Secondary | ICD-10-CM

## 2020-07-12 NOTE — Therapy (Signed)
[place] [facility] MAIN [facility] SERVICES [address] [place], [place], [address] Phone: [phone]   Fax:  [phone]  Physical Therapy Treatment  Patient Details  Name: Michael Cohen MRN: [id number] Date of Birth: [birth year] Referring Provider (PT): Dr. Ether Griffins   Encounter Date: 07/12/2020   PT End of Session - 07/12/20 1545    Visit Number 2    Number of Visits 17    Date for PT Re-Evaluation 08/31/20    Authorization Type eval: 07/06/20    PT Start Time 1600    PT Stop Time 1645    PT Time Calculation (min) 45 min    Activity Tolerance Patient tolerated treatment well    Behavior During Therapy [facility] for tasks assessed/performed           Past Medical History:  Diagnosis Date   Diabetes mellitus without complication ([facility])    Hypercholesteremia    Hypertension     Past Surgical History:  Procedure Laterality Date   CATARACT EXTRACTION Bilateral    COLONOSCOPY WITH PROPOFOL N/A 12/12/2017   Procedure: COLONOSCOPY WITH PROPOFOL;  Surgeon: [name], Boykin Nearing, MD;  Location: [facility] ENDOSCOPY;  Service: Gastroenterology;  Laterality: N/A;   EYE SURGERY      There were no vitals filed for this visit.     Subjective Assessment - 07/12/20 1602    Subjective Patient denies of any new symptoms or falls since last therapy session.    Pertinent History 79 y.o. 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 His pain began around a month ago and is located over the proximal lateral thigh and in the right buttocks. He describes his pain as achy more than sharp. He is not sure of any aggravating activities. He reports of having some pain at rest today. He reports associated pain or "tightness" felt along anterior and lateral portions of the lower leg. He was previously evaluated by Dr. Ether Griffins in podiatry and diagnosed with tibialis tendinitis. He denies associated groin pain, denies  radicular symptoms. He has tried Baclofen without any relief of his symptoms. Of note, the patient states that he was scheduled to go on a cruise this week but was canceled due to the pain he is experiencing. He states that he would be unable to walk around and participate fully in his vacation given his pain. An anteroposterior view of the pelvis and anteroposterior and lateral views of the right hip were obtained. Images reveal mild loss of femoral acetabular joint space with mild osteophyte formation noted. CAM deformity of the right hip noted. No fractures or dislocations. Anteroposterior, oblique, and lateral views of the lumbar spine were obtained. Images reveal no scoliosis. Mild loss of lumbar lordosis noted. Significant loss of disc space noted at L4-L5 and L5-S1. Intervertebral osteophyte formation of L4 and L5. No fractures or wedging noted. Calcification of the abdominal aorta noted. Degeneration of the lumbar facets noted. Patient was recently diagnosed with lumbar radiculoapthy due to symptoms being more consistent vs hip pathology given the absence of hip symptoms with provocative maneuvers. Prednisone tapers have been considered but is on hold for now due to patient's diabetes.    Limitations Walking;Standing;Lifting;Sitting    How long can you sit comfortably? Few hours    How long can you stand comfortably? 30 mins    How long can you walk comfortably? 1 hour    Currently in Pain? Yes    Pain Score  2     Pain Location Hip    Pain Orientation Right    Pain Descriptors / Indicators Aching    Pain Onset More than a month ago          NuStep x L1 x arms 9, chair 9 x 5 mins   All exercises were completed with 2#:    Supine SLR hip flexion 2 x 10 BLE; Supine straight leg hip abduction 2 x 10 BLE   Hooklying marching 2 x 10 BLE; Hooklying clams x 20 BLE,  Hooklying adductor squeeze 2 x 10 BLE, Hooklying bridges with arms across chest 2 x 10;   Seated LAQ x 20 BLE; Seated  marches x 20 BLE;  Precor BLE leg press 40# 2 x 15;  Standing hip strengthening with 2# ankle weights: Hip flexion marches x 20 BLE; HS curls x 20 BLE; Hip abduction x 20 BLE; Hip extension x 20 BLE;  Standing heel raises with BUE support x 20 BLE;  Sit to stand from regular height chair without UE support x 7, x 5         PT Short Term Goals - 07/06/20 1736      PT SHORT TERM GOAL #1   Title Patient will be independent in home exercise program to improve strength/mobility for better functional independence with ADLs.    Time 4    Period Weeks    Status New    Target Date 08/03/20      PT SHORT TERM GOAL #2   Title Patient will increase RLE gross strength to 4+/5 as to improve functional strength for independent gait, increased standing tolerance and increased ADL ability.    Baseline 12/21: RLE gross strength 4/5    Time 4    Period Weeks    Status New    Target Date 08/03/20             PT Long Term Goals - 07/06/20 1738      PT LONG TERM GOAL #1   Title Patient will increase FOTO score to equal to or greater than 67 to demonstrate statistically significant improvement in mobility and quality of life.    Baseline 12/21: 57    Time 8    Period Weeks    Status New    Target Date 08/31/20      PT LONG TERM GOAL #2   Title Patient (> 41 years old) will complete five times sit to stand test in < 15 seconds indicating an increased LE strength and improved balance.    Baseline 12/21: 17.65s    Time 8    Period Weeks    Status New    Target Date 08/31/20      PT LONG TERM GOAL #3   Title Patient will increase six minute walk test distance to >1000 for progression to community ambulator and improve gait ability    Baseline 12/21: 500    Time 8    Period Weeks    Status New    Target Date 08/31/20      PT LONG TERM GOAL #4   Title Patient will reduce timed up and go to <11 seconds to reduce fall risk and demonstrate improved transfer/gait ability.     Baseline 12/21: 16.78s    Time 8    Period Weeks    Status New    Target Date 08/31/20      PT LONG TERM GOAL #5   Title Patient will increase  lower extremity functional scale to >60/80 to demonstrate improved functional mobility and increased tolerance with ADLs.    Baseline 12/21: 50/80    Time 8    Period Weeks    Status New    Target Date 08/31/20                 Plan - 07/12/20 1738    Clinical Impression Statement Patient completed strengthening exercises with good tolerance to activity. He reports of his right knee pain radiating down to his medial arch in the foot after completing seated LAQ. Therapist provided verbal cues to maintain spine in netural when completing standing hip exercises. Patient will benefit from skilled PT services to increase BLE strength, balance skills, and mobility to improve patient's quality of life.    Personal Factors and Comorbidities Comorbidity 3+;Time since onset of injury/illness/exacerbation    Comorbidities diabetes, hypertension, hyperlipidemia    Examination-Activity Limitations Squat;Stairs;Transfers    Examination-Participation Restrictions Church    Stability/Clinical Decision Making Evolving/Moderate complexity    Rehab Potential Fair    PT Frequency 2x / week    PT Duration 8 weeks    PT Treatment/Interventions Moist Heat;Electrical Stimulation;Gait training;Stair training;Functional mobility training;Therapeutic activities;Therapeutic exercise;Balance training;Neuromuscular re-education;Patient/family education;Manual techniques;Passive range of motion;Energy conservation    PT Next Visit Plan Initiate balance exercises    PT Home Exercise Plan HEP    Consulted and Agree with Plan of Care Patient           Patient will benefit from skilled therapeutic intervention in order to improve the following deficits and impairments:  Abnormal gait,Decreased endurance,Impaired sensation,Decreased activity tolerance,Decreased  strength,Decreased balance,Decreased mobility,Difficulty walking,Decreased range of motion,Decreased safety awareness,Improper body mechanics  Visit Diagnosis: Muscle weakness (generalized)  Pain in right hip  Other abnormalities of gait and mobility     Problem List There are no problems to display for this patient.  Karl Luke PT, DPT Onalee Hua Suszanne Conners 07/12/2020, 5:44 PM  [place] MAIN [facility] SERVICES [address] [place], [place], [address] Phone: [phone]   Fax:  [phone]  Name: Michael Cohen MRN: [id number] Date of Birth: 1940/09/12

## 2020-07-14 ENCOUNTER — Other Ambulatory Visit: Payer: Self-pay

## 2020-07-14 ENCOUNTER — Ambulatory Visit: Payer: Medicare Other | Admitting: Physical Therapy

## 2020-07-14 DIAGNOSIS — M25551 Pain in right hip: Secondary | ICD-10-CM

## 2020-07-14 DIAGNOSIS — M6281 Muscle weakness (generalized): Secondary | ICD-10-CM

## 2020-07-14 DIAGNOSIS — R2689 Other abnormalities of gait and mobility: Secondary | ICD-10-CM

## 2020-07-14 NOTE — Therapy (Signed)
[place] MAIN [facility] SERVICES [address] [place], [place], [address] Phone: [phone]   Fax:  [phone]  Physical Therapy Treatment  Patient Details  Name: Michael Cohen MRN: [id number] Date of Birth: February 20, 1941 Referring Provider (PT): [name]   Encounter Date: 07/14/2020   PT End of Session - 07/14/20 1518    Visit Number 3    Number of Visits 17    Date for PT Re-Evaluation 08/31/20    Authorization Type eval: 07/06/20    PT Start Time 1515    PT Stop Time 1600    PT Time Calculation (min) 45 min    Activity Tolerance Patient tolerated treatment well    Behavior During Therapy [facility] for tasks assessed/performed           Past Medical History:  Diagnosis Date  . Diabetes mellitus without complication ([place])   . Hypercholesteremia   . Hypertension     Past Surgical History:  Procedure Laterality Date  . CATARACT EXTRACTION Bilateral   . COLONOSCOPY WITH PROPOFOL N/A 12/12/2017   Procedure: COLONOSCOPY WITH PROPOFOL;  Surgeon: [name], [name], MD;  Location: [facility] ENDOSCOPY;  Service: Gastroenterology;  Laterality: N/A;  . EYE SURGERY      There were no vitals filed for this visit.   Subjective Assessment - 07/14/20 1715    Subjective Patient reports of minor soreness after last therapy session which was resolved the next day. He denies of any falls or injuries.    Pertinent History 79 y.o. 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 His pain began around a month ago and is located over the proximal lateral thigh and in the right buttocks. He describes his pain as achy more than sharp. He is not sure of any aggravating activities. He reports of having some pain at rest today. He reports associated pain or "tightness" felt along anterior and lateral portions of the lower leg. He was previously evaluated by [name] in podiatry and diagnosed with tibialis  tendinitis. He denies associated groin pain, denies radicular symptoms. He has tried Baclofen without any relief of his symptoms. Of note, the patient states that he was scheduled to go on a cruise this week but was canceled due to the pain he is experiencing. He states that he would be unable to walk around and participate fully in his vacation given his pain. An anteroposterior view of the pelvis and anteroposterior and lateral views of the right hip were obtained. Images reveal mild loss of femoral acetabular joint space with mild osteophyte formation noted. CAM deformity of the right hip noted. No fractures or dislocations. Anteroposterior, oblique, and lateral views of the lumbar spine were obtained. Images reveal no scoliosis. Mild loss of lumbar lordosis noted. Significant loss of disc space noted at L4-L5 and L5-S1. Intervertebral osteophyte formation of L4 and L5. No fractures or wedging noted. Calcification of the abdominal aorta noted. Degeneration of the lumbar facets noted. Patient was recently diagnosed with lumbar radiculoapthy due to symptoms being more consistent vs hip pathology given the absence of hip symptoms with provocative maneuvers. Prednisone tapers have been considered but is on hold for now due to patient's diabetes.    Limitations Walking;Standing;Lifting;Sitting    How long can you sit comfortably? Few hours    How long can you stand comfortably? 30 mins    How long can you walk comfortably? 1 hour    Currently  in Pain? No/denies    Pain Onset More than a month ago           NuStep x L1 x arms 9, chair 9 x 5 mins   All exercises were completed with 2.5#:    Seated LAQ x 30 BLE; Seated marches x 30 BLE;  Standing hip strengthening with 2.5# ankle weights: Hip flexion marches x 20 BLE; HS curls x 20 BLE; Hip abduction x 20 BLE; Hip extension x 20 BLE;  Standing heel raises with BUE support x 20 BLE;  Sit to stand from regular height chair without UE support x10,  x 7, x 5  Precor BLE leg press 40# 2 x 15;    Airex x WBOS x EO/EC x 3 30s without BUE support Airex x NBOS x EO/EC x 3 30s without BUE support Airex step ups x 10 reps each leg        PT Short Term Goals - 07/06/20 1736      PT SHORT TERM GOAL #1   Title Patient will be independent in home exercise program to improve strength/mobility for better functional independence with ADLs.    Time 4    Period Weeks    Status New    Target Date 08/03/20      PT SHORT TERM GOAL #2   Title Patient will increase RLE gross strength to 4+/5 as to improve functional strength for independent gait, increased standing tolerance and increased ADL ability.    Baseline 12/21: RLE gross strength 4/5    Time 4    Period Weeks    Status New    Target Date 08/03/20             PT Long Term Goals - 07/06/20 1738      PT LONG TERM GOAL #1   Title Patient will increase FOTO score to equal to or greater than 67 to demonstrate statistically significant improvement in mobility and quality of life.    Baseline 12/21: 57    Time 8    Period Weeks    Status New    Target Date 08/31/20      PT LONG TERM GOAL #2   Title Patient (> 45 years old) will complete five times sit to stand test in < 15 seconds indicating an increased LE strength and improved balance.    Baseline 12/21: 17.65s    Time 8    Period Weeks    Status New    Target Date 08/31/20      PT LONG TERM GOAL #3   Title Patient will increase six minute walk test distance to >1000 for progression to community ambulator and improve gait ability    Baseline 12/21: 500    Time 8    Period Weeks    Status New    Target Date 08/31/20      PT LONG TERM GOAL #4   Title Patient will reduce timed up and go to <11 seconds to reduce fall risk and demonstrate improved transfer/gait ability.    Baseline 12/21: 16.78s    Time 8    Period Weeks    Status New    Target Date 08/31/20      PT LONG TERM GOAL #5   Title Patient will increase  lower extremity functional scale to >60/80 to demonstrate improved functional mobility and increased tolerance with ADLs.    Baseline 12/21: 50/80    Time 8    Period Weeks  Status New    Target Date 08/31/20                 Plan - 07/14/20 1716    Clinical Impression Statement Patient completed strengthening exercises with fair tolerance to activity. Patient tolerated progression of ankle weights with minimal increase in pain along RLE. Patient demonstrated increased postural swaying with static unsupported airex exercises. Therapist provided verbal cues to use ankle strategy to maintain COG within BOS with fair demonstration. 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 Patient would benefit from skilled PT to incresae strength, mobility, and balance skills to improve patient's quality of life.    Personal Factors and Comorbidities Comorbidity 3+;Time since onset of injury/illness/exacerbation    Comorbidities diabetes, hypertension, hyperlipidemia    Examination-Activity Limitations Squat;Stairs;Transfers    Examination-Participation Restrictions Church    Stability/Clinical Decision Making Evolving/Moderate complexity    Rehab Potential Fair    PT Frequency 2x / week    PT Duration 8 weeks    PT Treatment/Interventions Moist Heat;Electrical Stimulation;Gait training;Stair training;Functional mobility training;Therapeutic activities;Therapeutic exercise;Balance training;Neuromuscular re-education;Patient/family education;Manual techniques;Passive range of motion;Energy conservation    PT Next Visit Plan Initiate balance exercises    Consulted and Agree with Plan of Care Patient           Patient will benefit from skilled therapeutic intervention in order to improve the following deficits and impairments:  Abnormal gait,Decreased endurance,Impaired sensation,Decreased activity tolerance,Decreased  strength,Decreased balance,Decreased mobility,Difficulty walking,Decreased range of motion,Decreased safety awareness,Improper body mechanics  Visit Diagnosis: Muscle weakness (generalized)  Pain in right hip  Other abnormalities of gait and mobility     Problem List There are no problems to display for this patient.  Karl Luke PT, DPT On[place]e Hua Suszanne Conners 07/14/2020, 5:20 PM  [place] MAIN [facility] SERVICES [address] [place], [place], [address] Phone: [phone]   Fax:  [phone]  Name: Michael Cohen MRN: [id number] Date of Birth: September 28, 1940

## 2020-07-19 ENCOUNTER — Other Ambulatory Visit: Payer: Self-pay

## 2020-07-19 ENCOUNTER — Ambulatory Visit: Payer: Medicare Other | Attending: Podiatry

## 2020-07-19 DIAGNOSIS — R2689 Other abnormalities of gait and mobility: Secondary | ICD-10-CM | POA: Diagnosis present

## 2020-07-19 DIAGNOSIS — M6281 Muscle weakness (generalized): Secondary | ICD-10-CM | POA: Diagnosis not present

## 2020-07-19 DIAGNOSIS — M25551 Pain in right hip: Secondary | ICD-10-CM | POA: Diagnosis present

## 2020-07-19 NOTE — Therapy (Signed)
[place] MAIN [facility] SERVICES [address] [place], [place], [address] Phone: [phone]   Fax:  [phone]  Physical Therapy Treatment  Patient Details  Name: Michael Cohen MRN: [id number] Date of Birth: 1941/01/01 Referring Provider (PT): [name]   Encounter Date: 07/19/2020   PT End of Session - 07/19/20 1315    Visit Number 4    Number of Visits 17    Date for PT Re-Evaluation 08/31/20    Authorization Type eval: 07/06/20    PT Start Time 0110    PT Stop Time 0152    PT Time Calculation (min) 42 min    Equipment Utilized During Treatment Gait belt    Activity Tolerance Patient tolerated treatment well    Behavior During Therapy [facility] for tasks assessed/performed           Past Medical History:  Diagnosis Date  . Diabetes mellitus without complication ([place])   . Hypercholesteremia   . Hypertension     Past Surgical History:  Procedure Laterality Date  . CATARACT EXTRACTION Bilateral   . COLONOSCOPY WITH PROPOFOL N/A 12/12/2017   Procedure: COLONOSCOPY WITH PROPOFOL;  Surgeon: [name], [name], MD;  Location: [facility] ENDOSCOPY;  Service: Gastroenterology;  Laterality: N/A;  . EYE SURGERY      There were no vitals filed for this visit.   Subjective Assessment - 07/19/20 1316    Subjective The patient  reports that he does ok but has pain in his right thigh and this morning had cramping in the arch of his right foot.    Pertinent History 80 y.o. 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 His pain began around a month ago and is located over the proximal lateral thigh and in the right buttocks. He describes his pain as achy more than sharp. He is not sure of any aggravating activities. He reports of having some pain at rest today. He reports associated pain or "tightness" felt along anterior and lateral portions of the lower leg. He was previously evaluated by Dr.  [name] in podiatry and diagnosed with tibialis tendinitis. He denies associated groin pain, denies radicular symptoms. He has tried Baclofen without any relief of his symptoms. Of note, the patient states that he was scheduled to go on a cruise this week but was canceled due to the pain he is experiencing. He states that he would be unable to walk around and participate fully in his vacation given his pain. An anteroposterior view of the pelvis and anteroposterior and lateral views of the right hip were obtained. Images reveal mild loss of femoral acetabular joint space with mild osteophyte formation noted. CAM deformity of the right hip noted. No fractures or dislocations. Anteroposterior, oblique, and lateral views of the lumbar spine were obtained. Images reveal no scoliosis. Mild loss of lumbar lordosis noted. Significant loss of disc space noted at L4-L5 and L5-S1. Intervertebral osteophyte formation of L4 and L5. No fractures or wedging noted. Calcification of the abdominal aorta noted. Degeneration of the lumbar facets noted. Patient was recently diagnosed with lumbar radiculoapthy due to symptoms being more consistent vs hip pathology given the absence of hip symptoms with provocative maneuvers. Prednisone tapers have been considered but is on hold for now due to patient's diabetes.    Limitations Walking;Standing;Lifting;Sitting    How long can you sit comfortably? Few hours    How long can you stand comfortably? 30 mins  How long can you walk comfortably? 1 hour    Currently in Pain? Yes    Pain Location Leg    Pain Orientation Right;Upper    Pain Onset More than a month ago             NuStep x L1 x arms 9, chair 9 x 6 mins    All exercises were completed with 2.5#:     Seated LAQ x 30 BLE; Seated marches x 30 BLE;   Standing hip strengthening with 2.5# ankle weights: Hip flexion marches x 20 BLE; HS curls x 20 BLE; Hip abduction x 20 BLE; Hip extension x 20 BLE;   Standing  heel raises with BUE support x 20 BLE;   Sit to stand from regular height chair without UE support x10, x 7, x 5   Precor BLE leg press 40# 2 x 15;   Airex x WBOS x EO/EC x 2 30s without BUE support Airex x NBOS x EO/EC x 2 30s without BUE support Airex Tandem balance 30"x1 each without UE support [place] balance trail                            PT Education - 07/19/20 1331    Education Details exercise technique/balance    Person(s) Educated Patient    Methods Explanation    Comprehension Verbalized understanding            PT Short Term Goals - 07/06/20 1736      PT SHORT TERM GOAL #1   Title Patient will be independent in home exercise program to improve strength/mobility for better functional independence with ADLs.    Time 4    Period Weeks    Status New    Target Date 08/03/20      PT SHORT TERM GOAL #2   Title Patient will increase RLE gross strength to 4+/5 as to improve functional strength for independent gait, increased standing tolerance and increased ADL ability.    Baseline 12/21: RLE gross strength 4/5    Time 4    Period Weeks    Status New    Target Date 08/03/20             PT Long Term Goals - 07/06/20 1738      PT LONG TERM GOAL #1   Title Patient will increase FOTO score to equal to or greater than 67 to demonstrate statistically significant improvement in mobility and quality of life.    Baseline 12/21: 57    Time 8    Period Weeks    Status New    Target Date 08/31/20      PT LONG TERM GOAL #2   Title Patient (> 6 years old) will complete five times sit to stand test in < 15 seconds indicating an increased LE strength and improved balance.    Baseline 12/21: 17.65s    Time 8    Period Weeks    Status New    Target Date 08/31/20      PT LONG TERM GOAL #3   Title Patient will increase six minute walk test distance to >1000 for progression to community ambulator and improve gait ability    Baseline 12/21: 500     Time 8    Period Weeks    Status New    Target Date 08/31/20      PT LONG TERM GOAL #4   Title Patient will  reduce timed up and go to <11 seconds to reduce fall risk and demonstrate improved transfer/gait ability.    Baseline 12/21: 16.78s    Time 8    Period Weeks    Status New    Target Date 08/31/20      PT LONG TERM GOAL #5   Title Patient will increase lower extremity functional scale to >60/80 to demonstrate improved functional mobility and increased tolerance with ADLs.    Baseline 12/21: 50/80    Time 8    Period Weeks    Status New    Target Date 08/31/20                 Plan - 07/19/20 1402    Clinical Impression Statement Patient with good tolerance to exercises requiring verbal cueing for exercise technique.  The patient challenged with balance activities with mod sway on uneven surfaces.  The patient continues to benefit from additional skilled PT intervention to further improve LE strength, decrease symptoms and improve balance for improved quality of life.    Personal Factors and Comorbidities Comorbidity 3+;Time since onset of injury/illness/exacerbation    Comorbidities diabetes, hypertension, hyperlipidemia    Examination-Activity Limitations Squat;Stairs;Transfers    Examination-Participation Restrictions Church    Stability/Clinical Decision Making Evolving/Moderate complexity    Rehab Potential Fair    PT Frequency 2x / week    PT Duration 8 weeks    PT Treatment/Interventions Moist Heat;Electrical Stimulation;Gait training;Stair training;Functional mobility training;Therapeutic activities;Therapeutic exercise;Balance training;Neuromuscular re-education;Patient/family education;Manual techniques;Passive range of motion;Energy conservation    PT Next Visit Plan Initiate balance exercises    Consulted and Agree with Plan of Care Patient           Patient will benefit from skilled therapeutic intervention in order to improve the following deficits and  impairments:  Abnormal gait,Decreased endurance,Impaired sensation,Decreased activity tolerance,Decreased strength,Decreased balance,Decreased mobility,Difficulty walking,Decreased range of motion,Decreased safety awareness,Improper body mechanics  Visit Diagnosis: Muscle weakness (generalized)  Pain in right hip  Other abnormalities of gait and mobility     Problem List There are no problems to display for this patient.   Colin Mulders PT, DPT 07/19/2020, 2:08 PM  [place] [facility]IN [facility] SERVICES [address] [place], [place], [address] Phone: [phone]   Fax:  [phone]  Name: Duc Crocket MRN: [id number] Date of Birth: 27-Jan-1941

## 2020-07-21 ENCOUNTER — Ambulatory Visit: Payer: Medicare Other | Admitting: Physical Therapy

## 2020-07-21 ENCOUNTER — Other Ambulatory Visit: Payer: Self-pay

## 2020-07-21 DIAGNOSIS — M25551 Pain in right hip: Secondary | ICD-10-CM

## 2020-07-21 DIAGNOSIS — R2689 Other abnormalities of gait and mobility: Secondary | ICD-10-CM

## 2020-07-21 DIAGNOSIS — M6281 Muscle weakness (generalized): Secondary | ICD-10-CM | POA: Diagnosis not present

## 2020-07-21 NOTE — Therapy (Signed)
[place] [facility] MAIN [facility] SERVICES [address] [place], [place], [address] Phone: [phone]   Fax:  [phone]  Physical Therapy Treatment  Patient Details  Name: Michael Cohen MRN: [id number] Date of Birth: 11/01/1940 Referring Provider (PT): Dr. Ether Griffins   Encounter Date: 07/21/2020   PT End of Session - 07/21/20 1802    Visit Number 5    Number of Visits 17    Date for PT Re-Evaluation 08/31/20    Authorization Type eval: 07/06/20    PT Start Time 1515    PT Stop Time 1600    PT Time Calculation (min) 45 min    Equipment Utilized During Treatment Gait belt    Activity Tolerance Patient tolerated treatment well    Behavior During Therapy [facility] for tasks assessed/performed           Past Medical History:  Diagnosis Date  . Diabetes mellitus without complication (HCC)   . Hypercholesteremia   . Hypertension     Past Surgical History:  Procedure Laterality Date  . CATARACT EXTRACTION Bilateral   . COLONOSCOPY WITH PROPOFOL N/A 12/12/2017   Procedure: COLONOSCOPY WITH PROPOFOL;  Surgeon: [name], Boykin Nearing, MD;  Location: [facility] ENDOSCOPY;  Service: Gastroenterology;  Laterality: N/A;  . EYE SURGERY      There were no vitals filed for this visit.   Subjective Assessment - 07/21/20 1523    Subjective Patient states his symptoms on his RLE has not changed since last therapy session. He denies of any falls or injuries since last therapy session.    Pertinent History 80 y.o. 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 His pain began around a month ago and is located over the proximal lateral thigh and in the right buttocks. He describes his pain as achy more than sharp. He is not sure of any aggravating activities. He reports of having some pain at rest today. He reports associated pain or "tightness" felt along anterior and lateral portions of the lower leg. He was previously  evaluated by Dr. Ether Griffins in podiatry and diagnosed with tibialis tendinitis. He denies associated groin pain, denies radicular symptoms. He has tried Baclofen without any relief of his symptoms. Of note, the patient states that he was scheduled to go on a cruise this week but was canceled due to the pain he is experiencing. He states that he would be unable to walk around and participate fully in his vacation given his pain. An anteroposterior view of the pelvis and anteroposterior and lateral views of the right hip were obtained. Images reveal mild loss of femoral acetabular joint space with mild osteophyte formation noted. CAM deformity of the right hip noted. No fractures or dislocations. Anteroposterior, oblique, and lateral views of the lumbar spine were obtained. Images reveal no scoliosis. Mild loss of lumbar lordosis noted. Significant loss of disc space noted at L4-L5 and L5-S1. Intervertebral osteophyte formation of L4 and L5. No fractures or wedging noted. Calcification of the abdominal aorta noted. Degeneration of the lumbar facets noted. Patient was recently diagnosed with lumbar radiculoapthy due to symptoms being more consistent vs hip pathology given the absence of hip symptoms with provocative maneuvers. Prednisone tapers have been considered but is on hold for now due to patient's diabetes.    Limitations Walking;Standing;Lifting;Sitting    How long can you sit comfortably? Few hours    How long can you stand comfortably? 30 mins  How long can you walk comfortably? 1 hour    Currently in Pain? No/denies    Pain Onset More than a month ago            NuStep x L1 x arms 9, chair 9 x 6 mins    All exercises were completed with 3#:     Seated LAQ x 30 BLE; Seated marches x 30 BLE;   Precor BLE leg press 40# x 20 reps;   Kore Balance: Penguin race x 3 reps KoreMaze  Seated lower back stretch with big theraball forward/sideways x 10 10s holds         PT Short Term Goals  - 07/06/20 1736      PT SHORT TERM GOAL #1   Title Patient will be independent in home exercise program to improve strength/mobility for better functional independence with ADLs.    Time 4    Period Weeks    Status New    Target Date 08/03/20      PT SHORT TERM GOAL #2   Title Patient will increase RLE gross strength to 4+/5 as to improve functional strength for independent gait, increased standing tolerance and increased ADL ability.    Baseline 12/21: RLE gross strength 4/5    Time 4    Period Weeks    Status New    Target Date 08/03/20             PT Long Term Goals - 07/06/20 1738      PT LONG TERM GOAL #1   Title Patient will increase FOTO score to equal to or greater than 67 to demonstrate statistically significant improvement in mobility and quality of life.    Baseline 12/21: 57    Time 8    Period Weeks    Status New    Target Date 08/31/20      PT LONG TERM GOAL #2   Title Patient (> 23 years old) will complete five times sit to stand test in < 15 seconds indicating an increased LE strength and improved balance.    Baseline 12/21: 17.65s    Time 8    Period Weeks    Status New    Target Date 08/31/20      PT LONG TERM GOAL #3   Title Patient will increase six minute walk test distance to >1000 for progression to community ambulator and improve gait ability    Baseline 12/21: 500    Time 8    Period Weeks    Status New    Target Date 08/31/20      PT LONG TERM GOAL #4   Title Patient will reduce timed up and go to <11 seconds to reduce fall risk and demonstrate improved transfer/gait ability.    Baseline 12/21: 16.78s    Time 8    Period Weeks    Status New    Target Date 08/31/20      PT LONG TERM GOAL #5   Title Patient will increase lower extremity functional scale to >60/80 to demonstrate improved functional mobility and increased tolerance with ADLs.    Baseline 12/21: 50/80    Time 8    Period Weeks    Status New    Target Date 08/31/20                  Plan - 07/21/20 1802    Clinical Impression Statement Progressed patient's balance exercises with KoreBalance with fair- balance. Patient is heavily dependent  on BUE support and requires constant vebral cueing for improvement of motor control and weight shifting. Patient demonstrates slight improvement with repetition in games. Patient tolerated lower back stretches with fair tolerance and requires verbal cues for proper positioning of BLE to target Q/L. Patient would benefit from continued PT services to increase strength, mobility, and balance skills to improve patient's quality of life.    Personal Factors and Comorbidities Comorbidity 3+;Time since onset of injury/illness/exacerbation    Comorbidities diabetes, hypertension, hyperlipidemia    Examination-Activity Limitations Squat;Stairs;Transfers    Examination-Participation Restrictions Church    Stability/Clinical Decision Making Evolving/Moderate complexity    Rehab Potential Fair    PT Frequency 2x / week    PT Duration 8 weeks    PT Treatment/Interventions Moist Heat;Electrical Stimulation;Gait training;Stair training;Functional mobility training;Therapeutic activities;Therapeutic exercise;Balance training;Neuromuscular re-education;Patient/family education;Manual techniques;Passive range of motion;Energy conservation    PT Next Visit Plan Initiate balance exercises    Consulted and Agree with Plan of Care Patient           Patient will benefit from skilled therapeutic intervention in order to improve the following deficits and impairments:  Abnormal gait,Decreased endurance,Impaired sensation,Decreased activity tolerance,Decreased strength,Decreased balance,Decreased mobility,Difficulty walking,Decreased range of motion,Decreased safety awareness,Improper body mechanics  Visit Diagnosis: Muscle weakness (generalized)  Pain in right hip  Other abnormalities of gait and mobility     Problem List There  are no problems to display for this patient.  Karl Luke PT, DPT Onalee Hua Suszanne Conners 07/21/2020, 6:06 PM  [place] MAIN [facility] SERVICES [address] [place], [place], [address] Phone: [phone]   Fax:  [phone]  Name: Michael Cohen MRN: [id number] Date of Birth: 1940-12-20

## 2020-07-26 ENCOUNTER — Ambulatory Visit: Payer: Medicare Other | Admitting: Physical Therapy

## 2020-07-26 ENCOUNTER — Other Ambulatory Visit: Payer: Self-pay

## 2020-07-26 DIAGNOSIS — M6281 Muscle weakness (generalized): Secondary | ICD-10-CM

## 2020-07-26 DIAGNOSIS — M25551 Pain in right hip: Secondary | ICD-10-CM

## 2020-07-26 DIAGNOSIS — R2689 Other abnormalities of gait and mobility: Secondary | ICD-10-CM

## 2020-07-26 NOTE — Therapy (Signed)
[place] MAIN [facility] SERVICES [address] [place], [place], [address] Phone: [phone]   Fax:  [phone]  Physical Therapy Treatment  Patient Details  Name: Michael Cohen MRN: [id number] Date of Birth: [birth year] Referring Provider (PT): [name]   Encounter Date: 07/26/2020   PT End of Session - 07/26/20 1126    Visit Number 6    Number of Visits 17    Date for PT Re-Evaluation 08/31/20    Authorization Type eval: 07/06/20    PT Start Time 1100    PT Stop Time 1145    PT Time Calculation (min) 45 min    Equipment Utilized During Treatment Gait belt    Activity Tolerance Patient tolerated treatment well    Behavior During Therapy [facility] for tasks assessed/performed           Past Medical History:  Diagnosis Date  . Diabetes mellitus without complication ([place])   . Hypercholesteremia   . Hypertension     Past Surgical History:  Procedure Laterality Date  . CATARACT EXTRACTION Bilateral   . COLONOSCOPY WITH PROPOFOL N/A 12/12/2017   Procedure: COLONOSCOPY WITH PROPOFOL;  Surgeon: [name], [name], MD;  Location: [facility] ENDOSCOPY;  Service: Gastroenterology;  Laterality: N/A;  . EYE SURGERY      There were no vitals filed for this visit.   Subjective Assessment - 07/26/20 1106    Subjective Patient reports of increased stiffness in his R hip. He states when he tried to stand up from squatting to pick something up then his knees gave out and was able to stand back up with the support of a table.    Pertinent History 80 y.o. 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 His pain began around a month ago and is located over the proximal lateral thigh and in the right buttocks. He describes his pain as achy more than sharp. He is not sure of any aggravating activities. He reports of having some pain at rest today. He reports associated pain or "tightness" felt along  anterior and lateral portions of the lower leg. He was previously evaluated by [name] in podiatry and diagnosed with tibialis tendinitis. He denies associated groin pain, denies radicular symptoms. He has tried Baclofen without any relief of his symptoms. Of note, the patient states that he was scheduled to go on a cruise this week but was canceled due to the pain he is experiencing. He states that he would be unable to walk around and participate fully in his vacation given his pain. An anteroposterior view of the pelvis and anteroposterior and lateral views of the right hip were obtained. Images reveal mild loss of femoral acetabular joint space with mild osteophyte formation noted. CAM deformity of the right hip noted. No fractures or dislocations. Anteroposterior, oblique, and lateral views of the lumbar spine were obtained. Images reveal no scoliosis. Mild loss of lumbar lordosis noted. Significant loss of disc space noted at L4-L5 and L5-S1. Intervertebral osteophyte formation of L4 and L5. No fractures or wedging noted. Calcification of the abdominal aorta noted. Degeneration of the lumbar facets noted. Patient was recently diagnosed with lumbar radiculoapthy due to symptoms being more consistent vs hip pathology given the absence of hip symptoms with provocative maneuvers. Prednisone tapers have been considered but is on hold for now due to patient's diabetes.    Limitations Walking;Standing;Lifting;Sitting    How long can you sit comfortably?  Few hours    How long can you stand comfortably? 30 mins    How long can you walk comfortably? 1 hour    Currently in Pain? No/denies    Pain Onset More than a month ago           NuStep x L1 x arms 9, chair 9 x 6 mins   Supine:  HS stretch x PROM x 3 30s Figure 4 supine stretch x 3 30s  Lower trunk rotation x 10 10s holds x theraball  Double knee to chest with theraball x 10 10s holds  Bridges x 2 15 reps  Seated:  HS stretch x 3 30s    Balance:  Penguin race x 3         PT Short Term Goals - 07/06/20 1736      PT SHORT TERM GOAL #1   Title Patient will be independent in home exercise program to improve strength/mobility for better functional independence with ADLs.    Time 4    Period Weeks    Status New    Target Date 08/03/20      PT SHORT TERM GOAL #2   Title Patient will increase RLE gross strength to 4+/5 as to improve functional strength for independent gait, increased standing tolerance and increased ADL ability.    Baseline 12/21: RLE gross strength 4/5    Time 4    Period Weeks    Status New    Target Date 08/03/20             PT Long Term Goals - 07/06/20 1738      PT LONG TERM GOAL #1   Title Patient will increase FOTO score to equal to or greater than 67 to demonstrate statistically significant improvement in mobility and quality of life.    Baseline 12/21: 57    Time 8    Period Weeks    Status New    Target Date 08/31/20      PT LONG TERM GOAL #2   Title Patient (> 21 years old) will complete five times sit to stand test in < 15 seconds indicating an increased LE strength and improved balance.    Baseline 12/21: 17.65s    Time 8    Period Weeks    Status New    Target Date 08/31/20      PT LONG TERM GOAL #3   Title Patient will increase six minute walk test distance to >1000 for progression to community ambulator and improve gait ability    Baseline 12/21: 500    Time 8    Period Weeks    Status New    Target Date 08/31/20      PT LONG TERM GOAL #4   Title Patient will reduce timed up and go to <11 seconds to reduce fall risk and demonstrate improved transfer/gait ability.    Baseline 12/21: 16.78s    Time 8    Period Weeks    Status New    Target Date 08/31/20      PT LONG TERM GOAL #5   Title Patient will increase lower extremity functional scale to >60/80 to demonstrate improved functional mobility and increased tolerance with ADLs.    Baseline 12/21: 50/80     Time 8    Period Weeks    Status New    Target Date 08/31/20                 Plan - 07/26/20  1151    Clinical Impression Statement Focused on stretching to improve hip flexibility with fair tolerance to stretching. Patient demonstrates decreased hip IR/ER with poor tolerance to increased pressure to improve tissue flexibility. Patient tolerated balancing exercises with minimal posturing swaying.   Patient will continue to benefit from skilled physical therapy to improve generalized strength, ROM, and capacity for functional activity.    Personal Factors and Comorbidities Comorbidity 3+;Time since onset of injury/illness/exacerbation    Comorbidities diabetes, hypertension, hyperlipidemia    Examination-Activity Limitations Squat;Stairs;Transfers    Examination-Participation Restrictions Church    Stability/Clinical Decision Making Evolving/Moderate complexity    Rehab Potential Fair    PT Frequency 2x / week    PT Duration 8 weeks    PT Treatment/Interventions Moist Heat;Electrical Stimulation;Gait training;Stair training;Functional mobility training;Therapeutic activities;Therapeutic exercise;Balance training;Neuromuscular re-education;Patient/family education;Manual techniques;Passive range of motion;Energy conservation    PT Next Visit Plan Initiate balance exercises    PT Home Exercise Plan Hamstring stretch, piriformis stretch    Consulted and Agree with Plan of Care Patient           Patient will benefit from skilled therapeutic intervention in order to improve the following deficits and impairments:  Abnormal gait,Decreased endurance,Impaired sensation,Decreased activity tolerance,Decreased strength,Decreased balance,Decreased mobility,Difficulty walking,Decreased range of motion,Decreased safety awareness,Improper body mechanics  Visit Diagnosis: Pain in right hip  Other abnormalities of gait and mobility  Muscle weakness (generalized)     Problem List There are  no problems to display for this patient.  Karl Luke PT, DPT Onalee Hua Suszanne Conners 07/26/2020, 11:57 AM  [place] MAIN [facility] SERVICES [address] [place], [place], [address] Phone: [phone]   Fax:  [phone]  Name: Michael Cohen MRN: [id number] Date of Birth: 09-01-1940

## 2020-07-28 ENCOUNTER — Ambulatory Visit: Payer: Medicare Other

## 2020-07-28 ENCOUNTER — Other Ambulatory Visit: Payer: Self-pay

## 2020-07-28 DIAGNOSIS — R2689 Other abnormalities of gait and mobility: Secondary | ICD-10-CM

## 2020-07-28 DIAGNOSIS — M25551 Pain in right hip: Secondary | ICD-10-CM

## 2020-07-28 DIAGNOSIS — M6281 Muscle weakness (generalized): Secondary | ICD-10-CM

## 2020-07-28 NOTE — Therapy (Signed)
[place] MAIN [facility] SERVICES [address] [place], [place], [address] Phone: [phone]   Fax:  [phone]  Physical Therapy Treatment  Patient Details  Name: Michael Cohen MRN: [id number] Date of Birth: 1940-09-17 Referring Provider (PT): [name]   Encounter Date: 07/28/2020   PT End of Session - 07/28/20 1456    Visit Number 7    Number of Visits 17    Date for PT Re-Evaluation 08/31/20    Authorization Type eval: 07/06/20    PT Start Time 0145    PT Stop Time 0230    PT Time Calculation (min) 45 min    Equipment Utilized During Treatment Gait belt    Activity Tolerance Patient tolerated treatment well    Behavior During Therapy [facility] for tasks assessed/performed           Past Medical History:  Diagnosis Date  . Diabetes mellitus without complication ([place])   . Hypercholesteremia   . Hypertension     Past Surgical History:  Procedure Laterality Date  . CATARACT EXTRACTION Bilateral   . COLONOSCOPY WITH PROPOFOL N/A 12/12/2017   Procedure: COLONOSCOPY WITH PROPOFOL;  Surgeon: [name], [name], MD;  Location: [facility] ENDOSCOPY;  Service: Gastroenterology;  Laterality: N/A;  . EYE SURGERY      There were no vitals filed for this visit.   Subjective Assessment - 07/28/20 1455    Subjective The patient reports he is feeling pretty good today and has no pain.    Pertinent History 80 y.o. 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 His pain began around a month ago and is located over the proximal lateral thigh and in the right buttocks. He describes his pain as achy more than sharp. He is not sure of any aggravating activities. He reports of having some pain at rest today. He reports associated pain or "tightness" felt along anterior and lateral portions of the lower leg. He was previously evaluated by [name] in podiatry and diagnosed with tibialis tendinitis. He  denies associated groin pain, denies radicular symptoms. He has tried Baclofen without any relief of his symptoms. Of note, the patient states that he was scheduled to go on a cruise this week but was canceled due to the pain he is experiencing. He states that he would be unable to walk around and participate fully in his vacation given his pain. An anteroposterior view of the pelvis and anteroposterior and lateral views of the right hip were obtained. Images reveal mild loss of femoral acetabular joint space with mild osteophyte formation noted. CAM deformity of the right hip noted. No fractures or dislocations. Anteroposterior, oblique, and lateral views of the lumbar spine were obtained. Images reveal no scoliosis. Mild loss of lumbar lordosis noted. Significant loss of disc space noted at L4-L5 and L5-S1. Intervertebral osteophyte formation of L4 and L5. No fractures or wedging noted. Calcification of the abdominal aorta noted. Degeneration of the lumbar facets noted. Patient was recently diagnosed with lumbar radiculoapthy due to symptoms being more consistent vs hip pathology given the absence of hip symptoms with provocative maneuvers. Prednisone tapers have been considered but is on hold for now due to patient's diabetes.    Limitations Walking;Standing;Lifting;Sitting    How long can you sit comfortably? Few hours    How long can you stand comfortably? 30 mins    How long can you walk comfortably? 1 hour    Currently  in Pain? No/denies    Pain Score 0-No pain    Pain Onset More than a month ago              NuStep x L1 x arms 9, chair 9 x 6 mins    Supine:  HS stretch x PROM x 3 30s Figure 4 supine stretch x 3 30s  Lower trunk rotation x 10 10s holds x theraball  Double knee to chest with theraball x 10 10s holds  Bridges x 2 15 reps   Balance:  Penguin race- bronze race x5 mins                          PT Education - 07/28/20 1456    Education Details  exercise instruction.    Person(s) Educated Patient    Methods Explanation    Comprehension Verbalized understanding            PT Short Term Goals - 07/06/20 1736      PT SHORT TERM GOAL #1   Title Patient will be independent in home exercise program to improve strength/mobility for better functional independence with ADLs.    Time 4    Period Weeks    Status New    Target Date 08/03/20      PT SHORT TERM GOAL #2   Title Patient will increase RLE gross strength to 4+/5 as to improve functional strength for independent gait, increased standing tolerance and increased ADL ability.    Baseline 12/21: RLE gross strength 4/5    Time 4    Period Weeks    Status New    Target Date 08/03/20             PT Long Term Goals - 07/06/20 1738      PT LONG TERM GOAL #1   Title Patient will increase FOTO score to equal to or greater than 67 to demonstrate statistically significant improvement in mobility and quality of life.    Baseline 12/21: 57    Time 8    Period Weeks    Status New    Target Date 08/31/20      PT LONG TERM GOAL #2   Title Patient (> 86 years old) will complete five times sit to stand test in < 15 seconds indicating an increased LE strength and improved balance.    Baseline 12/21: 17.65s    Time 8    Period Weeks    Status New    Target Date 08/31/20      PT LONG TERM GOAL #3   Title Patient will increase six minute walk test distance to >1000 for progression to community ambulator and improve gait ability    Baseline 12/21: 500    Time 8    Period Weeks    Status New    Target Date 08/31/20      PT LONG TERM GOAL #4   Title Patient will reduce timed up and go to <11 seconds to reduce fall risk and demonstrate improved transfer/gait ability.    Baseline 12/21: 16.78s    Time 8    Period Weeks    Status New    Target Date 08/31/20      PT LONG TERM GOAL #5   Title Patient will increase lower extremity functional scale to >60/80 to demonstrate  improved functional mobility and increased tolerance with ADLs.    Baseline 12/21: 50/80    Time 8  Period Weeks    Status New    Target Date 08/31/20                 Plan - 07/28/20 1456    Clinical Impression Statement Patient with good tolerance to exercises in the clinic and was without increase in symptoms.  The patient does present with decreased flexibility of low back and hips impacting his overall symptoms.  The patient continues to benefit from additional skilled PT services to improve flexibility and LE strength for improved quality of life and return to prior level of function.    Personal Factors and Comorbidities Comorbidity 3+;Time since onset of injury/illness/exacerbation    Comorbidities diabetes, hypertension, hyperlipidemia    Examination-Activity Limitations Squat;Stairs;Transfers    Examination-Participation Restrictions Church    Stability/Clinical Decision Making Evolving/Moderate complexity    Rehab Potential Fair    PT Frequency 2x / week    PT Duration 8 weeks    PT Treatment/Interventions Moist Heat;Electrical Stimulation;Gait training;Stair training;Functional mobility training;Therapeutic activities;Therapeutic exercise;Balance training;Neuromuscular re-education;Patient/family education;Manual techniques;Passive range of motion;Energy conservation    PT Next Visit Plan Initiate balance exercises    PT Home Exercise Plan Hamstring stretch, piriformis stretch    Consulted and Agree with Plan of Care Patient           Patient will benefit from skilled therapeutic intervention in order to improve the following deficits and impairments:  Abnormal gait,Decreased endurance,Impaired sensation,Decreased activity tolerance,Decreased strength,Decreased balance,Decreased mobility,Difficulty walking,Decreased range of motion,Decreased safety awareness,Improper body mechanics  Visit Diagnosis: Pain in right hip  Other abnormalities of gait and  mobility  Muscle weakness (generalized)     Problem List There are no problems to display for this patient.   Hal Morales PT, DPT 07/28/2020, 3:00 PM  [place] MAIN [facility] SERVICES [address] [place], [place], [address] Phone: [phone]   Fax:  [phone]  Name: Michael Cohen MRN: [id number] Date of Birth: 05-01-1941

## 2020-08-04 ENCOUNTER — Ambulatory Visit: Payer: Medicare Other

## 2020-08-04 DIAGNOSIS — M25551 Pain in right hip: Secondary | ICD-10-CM

## 2020-08-04 DIAGNOSIS — M6281 Muscle weakness (generalized): Secondary | ICD-10-CM | POA: Diagnosis not present

## 2020-08-04 DIAGNOSIS — R2689 Other abnormalities of gait and mobility: Secondary | ICD-10-CM

## 2020-08-04 NOTE — Therapy (Signed)
[place] MAIN [facility] SERVICES [address] [place], [place], [address] Phone: [phone]   Fax:  [phone]  Physical Therapy Treatment  Patient Details  Name: Michael Cohen MRN: [id number] Date of Birth: 06/01/1941 Referring Provider (PT): [name]   Encounter Date: 08/04/2020   PT End of Session - 08/04/20 1543    Visit Number 8    Number of Visits 17    Date for PT Re-Evaluation 08/31/20    Authorization Type eval: 07/06/20    PT Start Time 1429    PT Stop Time 1516    PT Time Calculation (min) 47 min    Equipment Utilized During Treatment Gait belt    Activity Tolerance Patient tolerated treatment well    Behavior During Therapy [facility] for tasks assessed/performed           Past Medical History:  Diagnosis Date  . Diabetes mellitus without complication ([place])   . Hypercholesteremia   . Hypertension     Past Surgical History:  Procedure Laterality Date  . CATARACT EXTRACTION Bilateral   . COLONOSCOPY WITH PROPOFOL N/A 12/12/2017   Procedure: COLONOSCOPY WITH PROPOFOL;  Surgeon: [name], [name], MD;  Location: [facility] ENDOSCOPY;  Service: Gastroenterology;  Laterality: N/A;  . EYE SURGERY      There were no vitals filed for this visit.   Subjective Assessment - 08/04/20 1428    Subjective Pt reports no pain currently. Pt reports he did shovel snow earlier this week and that his muscles were sore afterward. Pt reports he performed heel raises in his shower with his hands on a grab bar.    Pertinent History 80 y.o. 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 His pain began around a month ago and is located over the proximal lateral thigh and in the right buttocks. He describes his pain as achy more than sharp. He is not sure of any aggravating activities. He reports of having some pain at rest today. He reports associated pain or "tightness" felt along anterior and  lateral portions of the lower leg. He was previously evaluated by [name] in podiatry and diagnosed with tibialis tendinitis. He denies associated groin pain, denies radicular symptoms. He has tried Baclofen without any relief of his symptoms. Of note, the patient states that he was scheduled to go on a cruise this week but was canceled due to the pain he is experiencing. He states that he would be unable to walk around and participate fully in his vacation given his pain. An anteroposterior view of the pelvis and anteroposterior and lateral views of the right hip were obtained. Images reveal mild loss of femoral acetabular joint space with mild osteophyte formation noted. CAM deformity of the right hip noted. No fractures or dislocations. Anteroposterior, oblique, and lateral views of the lumbar spine were obtained. Images reveal no scoliosis. Mild loss of lumbar lordosis noted. Significant loss of disc space noted at L4-L5 and L5-S1. Intervertebral osteophyte formation of L4 and L5. No fractures or wedging noted. Calcification of the abdominal aorta noted. Degeneration of the lumbar facets noted. Patient was recently diagnosed with lumbar radiculoapthy due to symptoms being more consistent vs hip pathology given the absence of hip symptoms with provocative maneuvers. Prednisone tapers have been considered but is on hold for now due to patient's diabetes.    Limitations Walking;Standing;Lifting;Sitting    How long can you sit comfortably? Few hours  How long can you stand comfortably? 30 mins    How long can you walk comfortably? 1 hour    Currently in Pain? No/denies    Pain Onset More than a month ago           Therex:  NuStep x L1x 6 mins  Seated HS stretch 1x30s B LEs, 1x60 sec B LEs Figure 4 supine stretch 2x60 sec B Les Bridges - 1x15 pt reports exercise is "easy" Staggered bridge - 2x10 each LE, VC provided  Trunk rotations - 1x20 cued to increase AROM to R  Knee to chest - 2x30  sec STS - 2x10 from mat table rates "easy," and 1x10 from standard chair height rates "medium"  Neuromuscular Re-ed: Korebalance Penguin race x 6 minutes. From B UE support to unilateral UE support with CGA       PT Education - 08/04/20 1542    Education Details Pt educated on technique with LE-staggered glute bridge.    Methods Explanation;Tactile cues;Verbal cues    Comprehension Verbalized understanding;Returned demonstration            PT Short Term Goals - 07/06/20 1736      PT SHORT TERM GOAL #1   Title Patient will be independent in home exercise program to improve strength/mobility for better functional independence with ADLs.    Time 4    Period Weeks    Status New    Target Date 08/03/20      PT SHORT TERM GOAL #2   Title Patient will increase RLE gross strength to 4+/5 as to improve functional strength for independent gait, increased standing tolerance and increased ADL ability.    Baseline 12/21: RLE gross strength 4/5    Time 4    Period Weeks    Status New    Target Date 08/03/20             PT Long Term Goals - 07/06/20 1738      PT LONG TERM GOAL #1   Title Patient will increase FOTO score to equal to or greater than 67 to demonstrate statistically significant improvement in mobility and quality of life.    Baseline 12/21: 57    Time 8    Period Weeks    Status New    Target Date 08/31/20      PT LONG TERM GOAL #2   Title Patient (> 68 years old) will complete five times sit to stand test in < 15 seconds indicating an increased LE strength and improved balance.    Baseline 12/21: 17.65s    Time 8    Period Weeks    Status New    Target Date 08/31/20      PT LONG TERM GOAL #3   Title Patient will increase six minute walk test distance to >1000 for progression to community ambulator and improve gait ability    Baseline 12/21: 500    Time 8    Period Weeks    Status New    Target Date 08/31/20      PT LONG TERM GOAL #4   Title Patient  will reduce timed up and go to <11 seconds to reduce fall risk and demonstrate improved transfer/gait ability.    Baseline 12/21: 16.78s    Time 8    Period Weeks    Status New    Target Date 08/31/20      PT LONG TERM GOAL #5   Title Patient will increase lower extremity functional  scale to >60/80 to demonstrate improved functional mobility and increased tolerance with ADLs.    Baseline 12/21: 50/80    Time 8    Period Weeks    Status New    Target Date 08/31/20                 Plan - 08/04/20 1543    Clinical Impression Statement Pt progressed on glute bridge exercise to staggered-LE glute bridge, indicating increased B LE strength. Pt with difficulty performing STS from standard chair height compared to mat table.  PT also educated pt on safety with exercises this session and instructed pt not to perform any exercises in his shower due to increased fall risk as pt had reported performing heel raises in his shower. Pt verbalized understanding. Pt will benefit from further skilled therapy to increase flexibility, B LE strength and balance to improve QOL and ease with all functional activities.    Personal Factors and Comorbidities Comorbidity 3+;Time since onset of injury/illness/exacerbation    Comorbidities diabetes, hypertension, hyperlipidemia    Examination-Activity Limitations Squat;Stairs;Transfers    Examination-Participation Restrictions Church    Stability/Clinical Decision Making Evolving/Moderate complexity    Rehab Potential Fair    PT Frequency 2x / week    PT Duration 8 weeks    PT Treatment/Interventions Moist Heat;Electrical Stimulation;Gait training;Stair training;Functional mobility training;Therapeutic activities;Therapeutic exercise;Balance training;Neuromuscular re-education;Patient/family education;Manual techniques;Passive range of motion;Energy conservation    PT Next Visit Plan Progress balance exercises and STS    PT Home Exercise Plan Hamstring  stretch, piriformis stretch    Consulted and Agree with Plan of Care Patient           Patient will benefit from skilled therapeutic intervention in order to improve the following deficits and impairments:  Abnormal gait,Decreased endurance,Impaired sensation,Decreased activity tolerance,Decreased strength,Decreased balance,Decreased mobility,Difficulty walking,Decreased range of motion,Decreased safety awareness,Improper body mechanics  Visit Diagnosis: Pain in right hip  Muscle weakness (generalized)  Other abnormalities of gait and mobility     Problem List There are no problems to display for this patient.  Ricard Dillon PT, DPT  08/04/2020, 3:54 PM  [place] MAIN [facility] SERVICES [address] [place], [place], [address] Phone: [phone]   Fax:  [phone]  Name: Michael Cohen MRN: [id number] Date of Birth: 1941/06/15

## 2020-08-09 ENCOUNTER — Ambulatory Visit: Payer: Medicare Other

## 2020-08-09 ENCOUNTER — Other Ambulatory Visit: Payer: Self-pay

## 2020-08-09 DIAGNOSIS — M25551 Pain in right hip: Secondary | ICD-10-CM

## 2020-08-09 DIAGNOSIS — R2689 Other abnormalities of gait and mobility: Secondary | ICD-10-CM

## 2020-08-09 DIAGNOSIS — M6281 Muscle weakness (generalized): Secondary | ICD-10-CM

## 2020-08-09 NOTE — Therapy (Signed)
[place] MAIN [facility] SERVICES [address] [place], [place], [address] Phone: [phone]   Fax:  [phone]  Physical Therapy Treatment  Patient Details  Name: Michael Cohen MRN: [id number] Date of Birth: 1941/02/19 Referring Provider (PT): [name]   Encounter Date: 08/09/2020   PT End of Session - 08/09/20 1442    Visit Number 9    Number of Visits 17    Date for PT Re-Evaluation 08/31/20    Authorization Type eval: 07/06/20    PT Start Time 7425    PT Stop Time 1514    PT Time Calculation (min) 40 min    Equipment Utilized During Treatment Gait belt    Activity Tolerance Patient tolerated treatment well    Behavior During Therapy [facility] for tasks assessed/performed           Past Medical History:  Diagnosis Date  . Diabetes mellitus without complication ([place])   . Hypercholesteremia   . Hypertension     Past Surgical History:  Procedure Laterality Date  . CATARACT EXTRACTION Bilateral   . COLONOSCOPY WITH PROPOFOL N/A 12/12/2017   Procedure: COLONOSCOPY WITH PROPOFOL;  Surgeon: [name], [name], MD;  Location: [facility] ENDOSCOPY;  Service: Gastroenterology;  Laterality: N/A;  . EYE SURGERY      There were no vitals filed for this visit.   Subjective Assessment - 08/09/20 1440    Subjective Pt reports hes doing generally well today, only stiff. He continues to walk the dog TID, then also doing water aerobics twice weekly, reports he has not added in HEP actiivty as well.    Pertinent History 80 y.o. 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 His pain began around a month ago and is located over the proximal lateral thigh and in the right buttocks. He describes his pain as achy more than sharp. He is not sure of any aggravating activities. He reports of having some pain at rest today. He reports associated pain or "tightness" felt along anterior and lateral portions  of the lower leg. He was previously evaluated by [name] in podiatry and diagnosed with tibialis tendinitis. He denies associated groin pain, denies radicular symptoms. He has tried Baclofen without any relief of his symptoms. Of note, the patient states that he was scheduled to go on a cruise this week but was canceled due to the pain he is experiencing. He states that he would be unable to walk around and participate fully in his vacation given his pain. An anteroposterior view of the pelvis and anteroposterior and lateral views of the right hip were obtained. Images reveal mild loss of femoral acetabular joint space with mild osteophyte formation noted. CAM deformity of the right hip noted. No fractures or dislocations. Anteroposterior, oblique, and lateral views of the lumbar spine were obtained. Images reveal no scoliosis. Mild loss of lumbar lordosis noted. Significant loss of disc space noted at L4-L5 and L5-S1. Intervertebral osteophyte formation of L4 and L5. No fractures or wedging noted. Calcification of the abdominal aorta noted. Degeneration of the lumbar facets noted. Patient was recently diagnosed with lumbar radiculoapthy due to symptoms being more consistent vs hip pathology given the absence of hip symptoms with provocative maneuvers. Prednisone tapers have been considered but is on hold for now due to patient's diabetes.    Currently in Pain? No/denies           INTERVENTION THIS DATE:  -Nustep  Seat 10, level 9, level 1, 6 minutes (AA/ROM of BLE trunk)  -RLE LAD 1x2 minutes  -STKC 2x30sec bilat  -FABER stretch 2x30sec bilat  -Supine Bridges 2x15 (only achieves 50% of full ROM or less)  -Sidelying Book openings AA/ROM 1x10 bilat  -STS 1x10 hands free  -Penguin racer to eat fish on korebalance(tm) computer module VR traininer *minguard assist with gait belt, BUE support on machine bars     PT Education - 08/09/20 1442    Education Details actiivty modification and fitting in  the HEP    Person(s) Educated Patient    Methods Explanation    Comprehension Verbalized understanding;Returned demonstration            PT Short Term Goals - 07/06/20 1736      PT SHORT TERM GOAL #1   Title Patient will be independent in home exercise program to improve strength/mobility for better functional independence with ADLs.    Time 4    Period Weeks    Status New    Target Date 08/03/20      PT SHORT TERM GOAL #2   Title Patient will increase RLE gross strength to 4+/5 as to improve functional strength for independent gait, increased standing tolerance and increased ADL ability.    Baseline 12/21: RLE gross strength 4/5    Time 4    Period Weeks    Status New    Target Date 08/03/20             PT Long Term Goals - 07/06/20 1738      PT LONG TERM GOAL #1   Title Patient will increase FOTO score to equal to or greater than 67 to demonstrate statistically significant improvement in mobility and quality of life.    Baseline 12/21: 57    Time 8    Period Weeks    Status New    Target Date 08/31/20      PT LONG TERM GOAL #2   Title Patient (> 66 years old) will complete five times sit to stand test in < 15 seconds indicating an increased LE strength and improved balance.    Baseline 12/21: 17.65s    Time 8    Period Weeks    Status New    Target Date 08/31/20      PT LONG TERM GOAL #3   Title Patient will increase six minute walk test distance to >1000 for progression to community ambulator and improve gait ability    Baseline 12/21: 500    Time 8    Period Weeks    Status New    Target Date 08/31/20      PT LONG TERM GOAL #4   Title Patient will reduce timed up and go to <11 seconds to reduce fall risk and demonstrate improved transfer/gait ability.    Baseline 12/21: 16.78s    Time 8    Period Weeks    Status New    Target Date 08/31/20      PT LONG TERM GOAL #5   Title Patient will increase lower extremity functional scale to >60/80 to  demonstrate improved functional mobility and increased tolerance with ADLs.    Baseline 12/21: 50/80    Time 8    Period Weeks    Status New    Target Date 08/31/20                 Plan - 08/09/20 1445    Clinical Impression Statement Continued with  current plan of care as laid out in evaluation and recent prior sessions. Pt remains motivated to advance progress toward goals. Rest breaks provided as needed, pt quick to ask when needed. Author maintains all interventions within appropriate level of intensity as not to purposefully exacerbate pain. Pt does require varying levels of assistance and cuing for completion of exercises for correct form and sometimes due to pain/weakness, particularly for book openings. Pt continues to demonstrate progress toward goals AEB progression of some interventions this date either in volume or intensity. No updates to HEP this date.    Personal Factors and Comorbidities Comorbidity 3+;Time since onset of injury/illness/exacerbation    Comorbidities diabetes, hypertension, hyperlipidemia    Examination-Activity Limitations Squat;Stairs;Transfers    Examination-Participation Restrictions Church    Stability/Clinical Decision Making Evolving/Moderate complexity    Clinical Decision Making Moderate    Rehab Potential Fair    PT Frequency 2x / week    PT Duration 8 weeks    PT Treatment/Interventions Moist Heat;Electrical Stimulation;Gait training;Stair training;Functional mobility training;Therapeutic activities;Therapeutic exercise;Balance training;Neuromuscular re-education;Patient/family education;Manual techniques;Passive range of motion;Energy conservation    PT Next Visit Plan Progress balance exercises and STS    PT Home Exercise Plan Hamstring stretch, piriformis stretch    Consulted and Agree with Plan of Care Patient           Patient will benefit from skilled therapeutic intervention in order to improve the following deficits and  impairments:  Abnormal gait,Decreased endurance,Impaired sensation,Decreased activity tolerance,Decreased strength,Decreased balance,Decreased mobility,Difficulty walking,Decreased range of motion,Decreased safety awareness,Improper body mechanics  Visit Diagnosis: Muscle weakness (generalized)  Pain in right hip  Other abnormalities of gait and mobility     Problem List There are no problems to display for this patient.  3:23 PM, 08/09/20 Etta Grandchild, PT, DPT Physical Therapist - [place] [phone]     Etta Grandchild 08/09/2020, 2:57 PM  [place] MAIN [facility] SERVICES [address] [place], [place], [address] Phone: [phone]   Fax:  [phone]  Name: Markey Deady MRN: [id number] Date of Birth: 25-Sep-1940

## 2020-08-11 ENCOUNTER — Ambulatory Visit: Payer: Medicare Other | Admitting: Physical Therapy

## 2020-08-11 ENCOUNTER — Other Ambulatory Visit: Payer: Self-pay

## 2020-08-11 DIAGNOSIS — R2689 Other abnormalities of gait and mobility: Secondary | ICD-10-CM

## 2020-08-11 DIAGNOSIS — M25551 Pain in right hip: Secondary | ICD-10-CM

## 2020-08-11 DIAGNOSIS — M6281 Muscle weakness (generalized): Secondary | ICD-10-CM | POA: Diagnosis not present

## 2020-08-11 NOTE — Therapy (Signed)
[place] MAIN [facility] SERVICES [address] [place], [place], [address] Phone: [phone]   Fax:  [phone]  Physical Therapy Treatment/Progress Note  Dates of reporting period  07/06/2020  to   08/11/2020   Patient Details  Name: Michael Cohen MRN: [id number] Date of Birth: Jun 22, 1941 Referring Provider (PT): [name]   Encounter Date: 08/11/2020   PT End of Session - 08/11/20 1429    Visit Number 10    Number of Visits 17    Date for PT Re-Evaluation 08/31/20    Authorization Type eval: 07/06/20    PT Start Time 1430    PT Stop Time 1515    PT Time Calculation (min) 45 min    Equipment Utilized During Treatment Gait belt    Activity Tolerance Patient tolerated treatment well    Behavior During Therapy [facility] for tasks assessed/performed           Past Medical History:  Diagnosis Date  . Diabetes mellitus without complication ([place])   . Hypercholesteremia   . Hypertension     Past Surgical History:  Procedure Laterality Date  . CATARACT EXTRACTION Bilateral   . COLONOSCOPY WITH PROPOFOL N/A 12/12/2017   Procedure: COLONOSCOPY WITH PROPOFOL;  Surgeon: [name], [name], MD;  Location: [facility] ENDOSCOPY;  Service: Gastroenterology;  Laterality: N/A;  . EYE SURGERY      There were no vitals filed for this visit.     Clinical Impression:   Patient demonstrates excellent motivation during session today. Outcome measures performed at today's therapy  session. His 5 times sit to stand decreasedsignificantly from 17.65 seconds at initial evaluation to 14.47 seconds during last visit. His 6-minute walk test also improved significantly from 500 feet with no rest breaks to 655 feet.Patient is still at a risk for falls as evidenced 12.08 seconds. Patient has yet to achieve maximal benefit from skilled PT services and will continue to benefit from additional therapy to see improvements in function. Patient will continue to benefit  from skilled physical therapy to improve generalized strength, ROM, and capacity for functional activity.       PT Short Term Goals - 08/11/20 1434      PT SHORT TERM GOAL #1   Title Patient will be independent in home exercise program to improve strength/mobility for better functional independence with ADLs.    Baseline patient is compliant with all his HEP; attending water aerobics on days he's not coming to therapy.    Time 4    Period Weeks    Status Achieved    Target Date 08/03/20      PT SHORT TERM GOAL #2   Title Patient will increase RLE gross strength to 4+/5 as to improve functional strength for independent gait, increased standing tolerance and increased ADL ability.    Baseline 12/21: RLE gross strength 4/5    Time 4    Period Weeks    Status On-going    Target Date 08/03/20             PT Long Term Goals - 08/11/20 1435      PT LONG TERM GOAL #1   Title Patient will increase FOTO score to equal to or greater than 67 to demonstrate statistically significant improvement in mobility and quality of life.    Baseline 12/21: 57    Time 8    Period Weeks    Status On-going      PT LONG TERM GOAL #2  Title Patient (> 32 years old) will complete five times sit to stand test in < 15 seconds indicating an increased LE strength and improved balance.    Baseline 12/21: 17.65s, 01/26: 14.47s    Time 8    Period Weeks    Status Achieved      PT LONG TERM GOAL #3   Title Patient will increase six minute walk test distance to >1000 for progression to community ambulator and improve gait ability    Baseline 12/21: 500, 01/26: 655 ft    Time 8    Period Weeks    Status On-going      PT LONG TERM GOAL #4   Title Patient will reduce timed up and go to <11 seconds to reduce fall risk and demonstrate improved transfer/gait ability.    Baseline 12/21: 16.78s, 01/26: 12.08s    Time 8    Period Weeks    Status On-going      PT LONG TERM GOAL #5   Title Patient will  increase lower extremity functional scale to >60/80 to demonstrate improved functional mobility and increased tolerance with ADLs.    Baseline 12/21: 50/80, 01/26: 62/80    Time 8    Period Weeks    Status Achieved                 Plan - 08/11/20 1655    Clinical Impression Statement Patient demonstrates excellent motivation during session today. Outcome measures performed at today's therapy  session. His 5 times sit to stand decreased significantly from 17.65 seconds at initial evaluation to 14.47 seconds during last visit. His 6-minute walk test also improved significantly from 500 feet with no rest breaks to 655 feet. Patient is still at a risk for falls as evidenced 12.08 seconds.  Patient has yet to achieve maximal benefit from skilled PT services and will continue to benefit from additional therapy to see improvements in function. Patient will continue to benefit from skilled physical therapy to improve generalized strength, ROM, and capacity for functional activity.    Personal Factors and Comorbidities Comorbidity 3+;Time since onset of injury/illness/exacerbation    Comorbidities diabetes, hypertension, hyperlipidemia    Examination-Activity Limitations Squat;Stairs;Transfers    Examination-Participation Restrictions Church    Stability/Clinical Decision Making Evolving/Moderate complexity    Rehab Potential Fair    PT Frequency 2x / week    PT Duration 8 weeks    PT Treatment/Interventions Moist Heat;Electrical Stimulation;Gait training;Stair training;Functional mobility training;Therapeutic activities;Therapeutic exercise;Balance training;Neuromuscular re-education;Patient/family education;Manual techniques;Passive range of motion;Energy conservation    PT Next Visit Plan Progress balance exercises and STS    PT Home Exercise Plan Hamstring stretch, piriformis stretch    Consulted and Agree with Plan of Care Patient           Patient will benefit from skilled therapeutic  intervention in order to improve the following deficits and impairments:  Abnormal gait,Decreased endurance,Impaired sensation,Decreased activity tolerance,Decreased strength,Decreased balance,Decreased mobility,Difficulty walking,Decreased range of motion,Decreased safety awareness,Improper body mechanics  Visit Diagnosis: Muscle weakness (generalized)  Pain in right hip  Other abnormalities of gait and mobility     Problem List There are no problems to display for this patient.  Karl Luke PT, DPT Onalee Hua Suszanne Conners 08/11/2020, 4:55 PM  [place] MAIN [facility] SERVICES [address] [place], [place], [address] Phone: [phone]   Fax:  [phone]  Name: Michael Cohen MRN: [id number] Date of Birth: 1940-10-17

## 2020-08-16 ENCOUNTER — Other Ambulatory Visit: Payer: Self-pay

## 2020-08-16 ENCOUNTER — Ambulatory Visit: Payer: Medicare Other | Admitting: Physical Therapy

## 2020-08-16 DIAGNOSIS — M25551 Pain in right hip: Secondary | ICD-10-CM

## 2020-08-16 DIAGNOSIS — M6281 Muscle weakness (generalized): Secondary | ICD-10-CM

## 2020-08-16 DIAGNOSIS — R2689 Other abnormalities of gait and mobility: Secondary | ICD-10-CM

## 2020-08-16 NOTE — Therapy (Signed)
[place] MAIN [facility] SERVICES [address] [place], [place], [address] Phone: [phone]   Fax:  [phone]  Physical Therapy Treatment  Patient Details  Name: Michael Cohen MRN: [id number] Date of Birth: 12/21/1940 Referring Provider (PT): [name]   Encounter Date: 08/16/2020   PT End of Session - 08/16/20 1445    Visit Number 11    Number of Visits 17    Date for PT Re-Evaluation 08/31/20    Authorization Type eval: 07/06/20    PT Start Time 1430    PT Stop Time 1515    PT Time Calculation (min) 45 min    Equipment Utilized During Treatment Gait belt    Activity Tolerance Patient tolerated treatment well    Behavior During Therapy [facility] for tasks assessed/performed           Past Medical History:  Diagnosis Date  . Diabetes mellitus without complication ([place])   . Hypercholesteremia   . Hypertension     Past Surgical History:  Procedure Laterality Date  . CATARACT EXTRACTION Bilateral   . COLONOSCOPY WITH PROPOFOL N/A 12/12/2017   Procedure: COLONOSCOPY WITH PROPOFOL;  Surgeon: [name], [name], MD;  Location: [facility] ENDOSCOPY;  Service: Gastroenterology;  Laterality: N/A;  . EYE SURGERY      There were no vitals filed for this visit.   Subjective Assessment - 08/16/20 1523    Subjective Patient reports that he is doing great today. He denies of any pain or stiffness today.    Pertinent History 80 y.o. 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 His pain began around a month ago and is located over the proximal lateral thigh and in the right buttocks. He describes his pain as achy more than sharp. He is not sure of any aggravating activities. He reports of having some pain at rest today. He reports associated pain or "tightness" felt along anterior and lateral portions of the lower leg. He was previously evaluated by [name] in podiatry and diagnosed with  tibialis tendinitis. He denies associated groin pain, denies radicular symptoms. He has tried Baclofen without any relief of his symptoms. Of note, the patient states that he was scheduled to go on a cruise this week but was canceled due to the pain he is experiencing. He states that he would be unable to walk around and participate fully in his vacation given his pain. An anteroposterior view of the pelvis and anteroposterior and lateral views of the right hip were obtained. Images reveal mild loss of femoral acetabular joint space with mild osteophyte formation noted. CAM deformity of the right hip noted. No fractures or dislocations. Anteroposterior, oblique, and lateral views of the lumbar spine were obtained. Images reveal no scoliosis. Mild loss of lumbar lordosis noted. Significant loss of disc space noted at L4-L5 and L5-S1. Intervertebral osteophyte formation of L4 and L5. No fractures or wedging noted. Calcification of the abdominal aorta noted. Degeneration of the lumbar facets noted. Patient was recently diagnosed with lumbar radiculoapthy due to symptoms being more consistent vs hip pathology given the absence of hip symptoms with provocative maneuvers. Prednisone tapers have been considered but is on hold for now due to patient's diabetes.    Currently in Pain? No/denies          Nu Step x L2 x 5 mins   There Ex:  All exercises were completed with 4# ankle weights Seated marches x  2 10 reps  Seated LAQ x 2 10 reps  Seated hip abd/add crossovers with semi foam x 2 10 reps each LE   Standing hamstring curls x 2 10 reps each LE Standing hip abduction x 2 10 reps each LE   Sit to stands with small yellow theraball (2000 gr) going up with standing x 7, x 5   Precor BLE leg press x 15 reps x 45#  Neuro Re-Ed: KoreBalance x Penguin Race x "downhill bunny"  With BUE support             PT Short Term Goals - 08/11/20 1434      PT SHORT TERM GOAL #1   Title Patient will be  independent in home exercise program to improve strength/mobility for better functional independence with ADLs.    Baseline patient is compliant with all his HEP; attending water aerobics on days he's not coming to therapy.    Time 4    Period Weeks    Status Achieved    Target Date 08/03/20      PT SHORT TERM GOAL #2   Title Patient will increase RLE gross strength to 4+/5 as to improve functional strength for independent gait, increased standing tolerance and increased ADL ability.    Baseline 12/21: RLE gross strength 4/5    Time 4    Period Weeks    Status On-going    Target Date 08/03/20             PT Long Term Goals - 08/11/20 1435      PT LONG TERM GOAL #1   Title Patient will increase FOTO score to equal to or greater than 67 to demonstrate statistically significant improvement in mobility and quality of life.    Baseline 12/21: 57    Time 8    Period Weeks    Status On-going      PT LONG TERM GOAL #2   Title Patient (> 66 years old) will complete five times sit to stand test in < 15 seconds indicating an increased LE strength and improved balance.    Baseline 12/21: 17.65s, 01/26: 14.47s    Time 8    Period Weeks    Status Achieved      PT LONG TERM GOAL #3   Title Patient will increase six minute walk test distance to >1000 for progression to community ambulator and improve gait ability    Baseline 12/21: 500, 01/26: 655 ft    Time 8    Period Weeks    Status On-going      PT LONG TERM GOAL #4   Title Patient will reduce timed up and go to <11 seconds to reduce fall risk and demonstrate improved transfer/gait ability.    Baseline 12/21: 16.78s, 01/26: 12.08s    Time 8    Period Weeks    Status On-going      PT LONG TERM GOAL #5   Title Patient will increase lower extremity functional scale to >60/80 to demonstrate improved functional mobility and increased tolerance with ADLs.    Baseline 12/21: 50/80, 01/26: 62/80    Time 8    Period Weeks    Status  Achieved                 Plan - 08/16/20 1524    Clinical Impression Statement Patient completed strengthening exercises with fair tolerance to activity. Progressed patient by increasing ankle weights today with increased muscle fatigue due to decreased functional  activity tolerance. Therapist provided verbal cues for proper positioning of hips when completing exercises to isolate hip flexors. He continues to demonstrates improved weight shifting with KoreBalance with BUE support. Patient would benefit from continued PT services to increase BLE strength and mobility to improve patient's quality of life.    Personal Factors and Comorbidities Comorbidity 3+;Time since onset of injury/illness/exacerbation    Comorbidities diabetes, hypertension, hyperlipidemia    Examination-Activity Limitations Squat;Stairs;Transfers    Examination-Participation Restrictions Church    Stability/Clinical Decision Making Evolving/Moderate complexity    Rehab Potential Fair    PT Frequency 2x / week    PT Duration 8 weeks    PT Treatment/Interventions Moist Heat;Electrical Stimulation;Gait training;Stair training;Functional mobility training;Therapeutic activities;Therapeutic exercise;Balance training;Neuromuscular re-education;Patient/family education;Manual techniques;Passive range of motion;Energy conservation    PT Next Visit Plan Progress balance exercises and STS    PT Home Exercise Plan Hamstring stretch, piriformis stretch    Consulted and Agree with Plan of Care Patient           Patient will benefit from skilled therapeutic intervention in order to improve the following deficits and impairments:  Abnormal gait,Decreased endurance,Impaired sensation,Decreased activity tolerance,Decreased strength,Decreased balance,Decreased mobility,Difficulty walking,Decreased range of motion,Decreased safety awareness,Improper body mechanics  Visit Diagnosis: Muscle weakness (generalized)  Pain in right  hip  Other abnormalities of gait and mobility     Problem List There are no problems to display for this patient.  Karl Luke PT, DPT Onalee Hua Suszanne Conners 08/16/2020, 3:39 PM  [place] MAIN [facility] SERVICES [address] [place], [place], [address] Phone: [phone]   Fax:  [phone]  Name: Michael Cohen MRN: [id number] Date of Birth: 03-Feb-1941

## 2020-08-18 ENCOUNTER — Ambulatory Visit: Payer: Medicare Other | Attending: Podiatry | Admitting: Physical Therapy

## 2020-08-18 ENCOUNTER — Other Ambulatory Visit: Payer: Self-pay

## 2020-08-18 DIAGNOSIS — M6281 Muscle weakness (generalized): Secondary | ICD-10-CM | POA: Diagnosis not present

## 2020-08-18 DIAGNOSIS — R269 Unspecified abnormalities of gait and mobility: Secondary | ICD-10-CM | POA: Insufficient documentation

## 2020-08-18 DIAGNOSIS — M25551 Pain in right hip: Secondary | ICD-10-CM | POA: Insufficient documentation

## 2020-08-18 DIAGNOSIS — R2689 Other abnormalities of gait and mobility: Secondary | ICD-10-CM | POA: Insufficient documentation

## 2020-08-18 NOTE — Therapy (Signed)
[place] MAIN [facility] SERVICES [address] [place], [place], [address] Phone: [phone]   Fax:  [phone]  Physical Therapy Treatment  Patient Details  Name: Michael Cohen MRN: [id number] Date of Birth: Apr 03, 1941 Referring Provider (PT): [name]   Encounter Date: 08/18/2020   PT End of Session - 08/18/20 1438    Visit Number 12    Number of Visits 17    Date for PT Re-Evaluation 08/31/20    Authorization Type eval: 07/06/20    PT Start Time 1430    PT Stop Time 1515    PT Time Calculation (min) 45 min    Equipment Utilized During Treatment Gait belt    Activity Tolerance Patient tolerated treatment well    Behavior During Therapy [facility] for tasks assessed/performed           Past Medical History:  Diagnosis Date  . Diabetes mellitus without complication ([place])   . Hypercholesteremia   . Hypertension     Past Surgical History:  Procedure Laterality Date  . CATARACT EXTRACTION Bilateral   . COLONOSCOPY WITH PROPOFOL N/A 12/12/2017   Procedure: COLONOSCOPY WITH PROPOFOL;  Surgeon: [name], [name], MD;  Location: [facility] ENDOSCOPY;  Service: Gastroenterology;  Laterality: N/A;  . EYE SURGERY      There were no vitals filed for this visit.   Subjective Assessment - 08/18/20 1438    Subjective Patient reports that his hips feels good today. He denies of any falls or injuries since last therapy session.    Pertinent History 80 y.o. 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 His pain began around a month ago and is located over the proximal lateral thigh and in the right buttocks. He describes his pain as achy more than sharp. He is not sure of any aggravating activities. He reports of having some pain at rest today. He reports associated pain or "tightness" felt along anterior and lateral portions of the lower leg. He was previously evaluated by [name] in podiatry  and diagnosed with tibialis tendinitis. He denies associated groin pain, denies radicular symptoms. He has tried Baclofen without any relief of his symptoms. Of note, the patient states that he was scheduled to go on a cruise this week but was canceled due to the pain he is experiencing. He states that he would be unable to walk around and participate fully in his vacation given his pain. An anteroposterior view of the pelvis and anteroposterior and lateral views of the right hip were obtained. Images reveal mild loss of femoral acetabular joint space with mild osteophyte formation noted. CAM deformity of the right hip noted. No fractures or dislocations. Anteroposterior, oblique, and lateral views of the lumbar spine were obtained. Images reveal no scoliosis. Mild loss of lumbar lordosis noted. Significant loss of disc space noted at L4-L5 and L5-S1. Intervertebral osteophyte formation of L4 and L5. No fractures or wedging noted. Calcification of the abdominal aorta noted. Degeneration of the lumbar facets noted. Patient was recently diagnosed with lumbar radiculoapthy due to symptoms being more consistent vs hip pathology given the absence of hip symptoms with provocative maneuvers. Prednisone tapers have been considered but is on hold for now due to patient's diabetes.    Limitations Walking;Standing;Lifting;Sitting    How long can you sit comfortably? Few hours    How long can you stand comfortably? 30 mins    How long can you walk comfortably?  1 hour    Currently in Pain? No/denies    Pain Onset More than a month ago           Nu Step x L2 x 5 mins  KoreBalance "downhill bunny" on TuxRace x unilateral UE support Koremaze x unilateral UE support Airex x normal stance x EO/EC x 3 30s without UE support Airex x WBOS x EO/EC x 3 30s without UE support    Step ups on stairs with BUE support x 10 reps each LE x forward/sideways  Sit to stands x 13, x 10, x 7 with small red weighted ball going up  with stands; seated rest breaks between each attempt to recover from muscle fatigue           PT Short Term Goals - 08/11/20 1434      PT SHORT TERM GOAL #1   Title Patient will be independent in home exercise program to improve strength/mobility for better functional independence with ADLs.    Baseline patient is compliant with all his HEP; attending water aerobics on days he's not coming to therapy.    Time 4    Period Weeks    Status Achieved    Target Date 08/03/20      PT SHORT TERM GOAL #2   Title Patient will increase RLE gross strength to 4+/5 as to improve functional strength for independent gait, increased standing tolerance and increased ADL ability.    Baseline 12/21: RLE gross strength 4/5    Time 4    Period Weeks    Status On-going    Target Date 08/03/20             PT Long Term Goals - 08/11/20 1435      PT LONG TERM GOAL #1   Title Patient will increase FOTO score to equal to or greater than 67 to demonstrate statistically significant improvement in mobility and quality of life.    Baseline 12/21: 57    Time 8    Period Weeks    Status On-going      PT LONG TERM GOAL #2   Title Patient (> 16 years old) will complete five times sit to stand test in < 15 seconds indicating an increased LE strength and improved balance.    Baseline 12/21: 17.65s, 01/26: 14.47s    Time 8    Period Weeks    Status Achieved      PT LONG TERM GOAL #3   Title Patient will increase six minute walk test distance to >1000 for progression to community ambulator and improve gait ability    Baseline 12/21: 500, 01/26: 655 ft    Time 8    Period Weeks    Status On-going      PT LONG TERM GOAL #4   Title Patient will reduce timed up and go to <11 seconds to reduce fall risk and demonstrate improved transfer/gait ability.    Baseline 12/21: 16.78s, 01/26: 12.08s    Time 8    Period Weeks    Status On-going      PT LONG TERM GOAL #5   Title Patient will increase lower  extremity functional scale to >60/80 to demonstrate improved functional mobility and increased tolerance with ADLs.    Baseline 12/21: 50/80, 01/26: 62/80    Time 8    Period Weeks    Status Achieved                 Plan - 08/18/20  1613    Clinical Impression Statement Patient completed strengthening and balance exercises with good tolerance to activity today. He demonstrates improvement with muscle endurance as evidenced by completing more exercises before having to sit down to recover from muscle fatigue. Patient demonstrates improvement in balance exercises with minimal to no postural swaying with Airex exercises. Patient would benefit from continued PT services to increase BLE strength and mobility to improve patient's quality of life.    Personal Factors and Comorbidities Comorbidity 3+;Time since onset of injury/illness/exacerbation    Comorbidities diabetes, hypertension, hyperlipidemia    Examination-Activity Limitations Squat;Stairs;Transfers    Examination-Participation Restrictions Church    Stability/Clinical Decision Making Evolving/Moderate complexity    Rehab Potential Fair    PT Frequency 2x / week    PT Duration 8 weeks    PT Treatment/Interventions Moist Heat;Electrical Stimulation;Gait training;Stair training;Functional mobility training;Therapeutic activities;Therapeutic exercise;Balance training;Neuromuscular re-education;Patient/family education;Manual techniques;Passive range of motion;Energy conservation    PT Next Visit Plan Progress balance exercises and STS    PT Home Exercise Plan Hamstring stretch, piriformis stretch    Consulted and Agree with Plan of Care Patient           Patient will benefit from skilled therapeutic intervention in order to improve the following deficits and impairments:  Abnormal gait,Decreased endurance,Impaired sensation,Decreased activity tolerance,Decreased strength,Decreased balance,Decreased mobility,Difficulty  walking,Decreased range of motion,Decreased safety awareness,Improper body mechanics  Visit Diagnosis: Muscle weakness (generalized)  Other abnormalities of gait and mobility  Pain in right hip     Problem List There are no problems to display for this patient.  Karl Luke PT, DPT Onalee Hua Suszanne Conners 08/18/2020, 4:20 PM  [place] MAIN [facility] SERVICES [address] [place], [place], [address] Phone: [phone]   Fax:  [phone]  Name: Kyshon Tolliver MRN: [id number] Date of Birth: 04-08-1941

## 2020-08-23 ENCOUNTER — Ambulatory Visit: Payer: Medicare Other

## 2020-08-23 ENCOUNTER — Other Ambulatory Visit: Payer: Self-pay

## 2020-08-23 DIAGNOSIS — M6281 Muscle weakness (generalized): Secondary | ICD-10-CM | POA: Diagnosis not present

## 2020-08-23 DIAGNOSIS — M25551 Pain in right hip: Secondary | ICD-10-CM

## 2020-08-23 DIAGNOSIS — R2689 Other abnormalities of gait and mobility: Secondary | ICD-10-CM

## 2020-08-23 NOTE — Therapy (Addendum)
[place] MAIN [facility] SERVICES [address] [place], [place], [address] Phone: [phone]   Fax:  [phone]  Physical Therapy Treatment  Patient Details  Name: Michael Cohen MRN: [id number] Date of Birth: May 03, 1941 Referring Provider (PT): [name]   Encounter Date: 08/23/2020    Past Medical History:  Diagnosis Date  . Diabetes mellitus without complication ([place])   . Hypercholesteremia   . Hypertension     Past Surgical History:  Procedure Laterality Date  . CATARACT EXTRACTION Bilateral   . COLONOSCOPY WITH PROPOFOL N/A 12/12/2017   Procedure: COLONOSCOPY WITH PROPOFOL;  Surgeon: [name], [name], MD;  Location: [facility] ENDOSCOPY;  Service: Gastroenterology;  Laterality: N/A;  . EYE SURGERY      There were no vitals filed for this visit.  Porfirio Mylar Step x L3 x 4 mins; for cardiovascular strength and musculoskeletal challenge   KoreBalance "downhill bunny" on TuxRace x unilateral UE support Koremaze x unilateral UE support  Matrix resisted ambulation : #7.5, 2x each side lateral stepping; close CGA   Next to support surface: airex pad: eyes closed 30 second hold airex pad: balloon taps reaching inside/outside BOS x 4 minutes  In hallway:  Horizontal head turns 86 ft with cues for direction change Sudden initiation/termination of ambulation 86 ft "red light green light"  seated: Sit to stand weighted ball (2000 gr) 10x RTB around toes, alternating march with cues for width of BOS 12x each LE          Pt educated throughout session about proper posture and technique with exercises. Improved exercise technique, movement at target joints, use of target muscles after min to mod verbal, visual, tactile cues  Patient remains highly motivated throughout physical therapy session. Patient does demonstrate lateral hip drop with prolonged ambulation and standing with fatigue. He tolerated weight shifts well with no reported  pain at this time. . Pt will benefit from further skilled therapy to increase flexibility, B LE strength and balance to improve QOL and ease with all functional activities.                        PT Short Term Goals - 08/11/20 1434      PT SHORT TERM GOAL #1   Title Patient will be independent in home exercise program to improve strength/mobility for better functional independence with ADLs.    Baseline patient is compliant with all his HEP; attending water aerobics on days he's not coming to therapy.    Time 4    Period Weeks    Status Achieved    Target Date 08/03/20      PT SHORT TERM GOAL #2   Title Patient will increase RLE gross strength to 4+/5 as to improve functional strength for independent gait, increased standing tolerance and increased ADL ability.    Baseline 12/21: RLE gross strength 4/5    Time 4    Period Weeks    Status On-going    Target Date 08/03/20             PT Long Term Goals - 08/11/20 1435      PT LONG TERM GOAL #1   Title Patient will increase FOTO score to equal to or greater than 67 to demonstrate statistically significant improvement in mobility and quality of life.    Baseline 12/21: 57    Time 8    Period Weeks    Status On-going  PT LONG TERM GOAL #2   Title Patient (> 1 years old) will complete five times sit to stand test in < 15 seconds indicating an increased LE strength and improved balance.    Baseline 12/21: 17.65s, 01/26: 14.47s    Time 8    Period Weeks    Status Achieved      PT LONG TERM GOAL #3   Title Patient will increase six minute walk test distance to >1000 for progression to community ambulator and improve gait ability    Baseline 12/21: 500, 01/26: 655 ft    Time 8    Period Weeks    Status On-going      PT LONG TERM GOAL #4   Title Patient will reduce timed up and go to <11 seconds to reduce fall risk and demonstrate improved transfer/gait ability.    Baseline 12/21: 16.78s, 01/26: 12.08s     Time 8    Period Weeks    Status On-going      PT LONG TERM GOAL #5   Title Patient will increase lower extremity functional scale to >60/80 to demonstrate improved functional mobility and increased tolerance with ADLs.    Baseline 12/21: 50/80, 01/26: 62/80    Time 8    Period Weeks    Status Achieved                  Patient will benefit from skilled therapeutic intervention in order to improve the following deficits and impairments:  Abnormal gait,Decreased endurance,Impaired sensation,Decreased activity tolerance,Decreased strength,Decreased balance,Decreased mobility,Difficulty walking,Decreased range of motion,Decreased safety awareness,Improper body mechanics  Visit Diagnosis: Muscle weakness (generalized)  Other abnormalities of gait and mobility  Pain in right hip     Problem List There are no problems to display for this patient.  Janna Arch, PT, DPT   08/25/2020, 12:15 PM  [place] MAIN [facility] SERVICES [address] [place], [place], [address] Phone: [phone]   Fax:  [phone]  Name: Haydn Hutsell MRN: [id number] Date of Birth: 09-26-1940

## 2020-08-25 ENCOUNTER — Ambulatory Visit: Payer: Medicare Other | Admitting: Physical Therapy

## 2020-08-25 ENCOUNTER — Other Ambulatory Visit: Payer: Self-pay

## 2020-08-25 DIAGNOSIS — M25551 Pain in right hip: Secondary | ICD-10-CM

## 2020-08-25 DIAGNOSIS — M6281 Muscle weakness (generalized): Secondary | ICD-10-CM

## 2020-08-25 DIAGNOSIS — R2689 Other abnormalities of gait and mobility: Secondary | ICD-10-CM

## 2020-08-25 NOTE — Therapy (Signed)
[place] MAIN [facility] SERVICES [address] [place], [place], [address] Phone: [phone]   Fax:  [phone]  Physical Therapy Treatment  Patient Details  Name: Michael Cohen MRN: [id number] Date of Birth: 1941-05-08 Referring Provider (PT): [name]   Encounter Date: 08/25/2020   PT End of Session - 08/25/20 1400    Visit Number 14    Number of Visits 17    Date for PT Re-Evaluation 08/31/20    Authorization Type eval: 07/06/20    PT Start Time 1350    PT Stop Time 1430    PT Time Calculation (min) 40 min    Equipment Utilized During Treatment Gait belt    Activity Tolerance Patient tolerated treatment well    Behavior During Therapy [facility] for tasks assessed/performed           Past Medical History:  Diagnosis Date  . Diabetes mellitus without complication ([place])   . Hypercholesteremia   . Hypertension     Past Surgical History:  Procedure Laterality Date  . CATARACT EXTRACTION Bilateral   . COLONOSCOPY WITH PROPOFOL N/A 12/12/2017   Procedure: COLONOSCOPY WITH PROPOFOL;  Surgeon: [name], [name], MD;  Location: [facility] ENDOSCOPY;  Service: Gastroenterology;  Laterality: N/A;  . EYE SURGERY      There were no vitals filed for this visit.   Subjective Assessment - 08/25/20 1359    Subjective Patient denies of any pain or falls or since last therapy session.    Pertinent History 80 y.o. 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 His pain began around a month ago and is located over the proximal lateral thigh and in the right buttocks. He describes his pain as achy more than sharp. He is not sure of any aggravating activities. He reports of having some pain at rest today. He reports associated pain or "tightness" felt along anterior and lateral portions of the lower leg. He was previously evaluated by [name] in podiatry and diagnosed with tibialis tendinitis. He  denies associated groin pain, denies radicular symptoms. He has tried Baclofen without any relief of his symptoms. Of note, the patient states that he was scheduled to go on a cruise this week but was canceled due to the pain he is experiencing. He states that he would be unable to walk around and participate fully in his vacation given his pain. An anteroposterior view of the pelvis and anteroposterior and lateral views of the right hip were obtained. Images reveal mild loss of femoral acetabular joint space with mild osteophyte formation noted. CAM deformity of the right hip noted. No fractures or dislocations. Anteroposterior, oblique, and lateral views of the lumbar spine were obtained. Images reveal no scoliosis. Mild loss of lumbar lordosis noted. Significant loss of disc space noted at L4-L5 and L5-S1. Intervertebral osteophyte formation of L4 and L5. No fractures or wedging noted. Calcification of the abdominal aorta noted. Degeneration of the lumbar facets noted. Patient was recently diagnosed with lumbar radiculoapthy due to symptoms being more consistent vs hip pathology given the absence of hip symptoms with provocative maneuvers. Prednisone tapers have been considered but is on hold for now due to patient's diabetes.    Limitations Walking;Standing;Lifting;Sitting    How long can you sit comfortably? Few hours    How long can you stand comfortably? 30 mins    How long can you walk comfortably? 1 hour    Currently in  Pain? No/denies           Nu Step x L1 x 6 mins   Step ups on stairs with BUE support x forward/sideways x 10 reps each LE   Sit to stands x 11, x 9 without BUE support  Precor BLE leg press x 55# x 15 reps   Neuro Re Ed:  Airex x normal stance x EO/EC x 3 30s without BUE support Airex x NBOS x EO/EC x 3 30s without BUE support  Airex x WBOS x EO/EC x 3 30s without BUE support   Clinical Impression: Patient completed strengthening and balance exercises with good  tolerance to activity. He demonstrates increased weight shifting to the left with balance exercises and required minA to maintain steady balance. Patient will continue to benefit from skilled physical therapy to improve generalized strength, ROM, and capacity for functional activity.      PT Short Term Goals - 08/11/20 1434      PT SHORT TERM GOAL #1   Title Patient will be independent in home exercise program to improve strength/mobility for better functional independence with ADLs.    Baseline patient is compliant with all his HEP; attending water aerobics on days he's not coming to therapy.    Time 4    Period Weeks    Status Achieved    Target Date 08/03/20      PT SHORT TERM GOAL #2   Title Patient will increase RLE gross strength to 4+/5 as to improve functional strength for independent gait, increased standing tolerance and increased ADL ability.    Baseline 12/21: RLE gross strength 4/5    Time 4    Period Weeks    Status On-going    Target Date 08/03/20             PT Long Term Goals - 08/11/20 1435      PT LONG TERM GOAL #1   Title Patient will increase FOTO score to equal to or greater than 67 to demonstrate statistically significant improvement in mobility and quality of life.    Baseline 12/21: 57    Time 8    Period Weeks    Status On-going      PT LONG TERM GOAL #2   Title Patient (> 20 years old) will complete five times sit to stand test in < 15 seconds indicating an increased LE strength and improved balance.    Baseline 12/21: 17.65s, 01/26: 14.47s    Time 8    Period Weeks    Status Achieved      PT LONG TERM GOAL #3   Title Patient will increase six minute walk test distance to >1000 for progression to community ambulator and improve gait ability    Baseline 12/21: 500, 01/26: 655 ft    Time 8    Period Weeks    Status On-going      PT LONG TERM GOAL #4   Title Patient will reduce timed up and go to <11 seconds to reduce fall risk and  demonstrate improved transfer/gait ability.    Baseline 12/21: 16.78s, 01/26: 12.08s    Time 8    Period Weeks    Status On-going      PT LONG TERM GOAL #5   Title Patient will increase lower extremity functional scale to >60/80 to demonstrate improved functional mobility and increased tolerance with ADLs.    Baseline 12/21: 50/80, 01/26: 62/80    Time 8    Period Weeks  Status Achieved                 Plan - 08/25/20 1626    Clinical Impression Statement Patient completed strengthening and balance exercises with good tolerance to activity. He demonstrates increased weight shifting to the left with balance exercises and required minA to maintain steady balance. Patient will continue to benefit from skilled physical therapy to improve generalized strength, ROM, and capacity for functional activity.    Personal Factors and Comorbidities Comorbidity 3+;Time since onset of injury/illness/exacerbation    Comorbidities diabetes, hypertension, hyperlipidemia    Examination-Activity Limitations Squat;Stairs;Transfers    Examination-Participation Restrictions [employer]    Stability/Clinical Decision Making Evolving/Moderate complexity    Rehab Potential Fair    PT Frequency 2x / week    PT Duration 8 weeks    PT Treatment/Interventions Moist Heat;Electrical Stimulation;Gait training;Stair training;Functional mobility training;Therapeutic activities;Therapeutic exercise;Balance training;Neuromuscular re-education;Patient/family education;Manual techniques;Passive range of motion;Energy conservation    PT Next Visit Plan Progress balance exercises and STS    PT Home Exercise Plan Hamstring stretch, piriformis stretch    Consulted and Agree with Plan of Care Patient           Patient will benefit from skilled therapeutic intervention in order to improve the following deficits and impairments:  Abnormal gait,Decreased endurance,Impaired sensation,Decreased activity tolerance,Decreased  strength,Decreased balance,Decreased mobility,Difficulty walking,Decreased range of motion,Decreased safety awareness,Improper body mechanics  Visit Diagnosis: Muscle weakness (generalized)  Other abnormalities of gait and mobility  Pain in right hip     Problem List There are no problems to display for this patient.  Karl Luke PT, DPT Onalee Hua Suszanne Conners 08/25/2020, 4:27 PM  [place] MAIN [facility] SERVICES [address] [place], [place], [address] Phone: [phone]   Fax:  [phone]  Name: Vernis Eid MRN: [id number] Date of Birth: 01-Dec-1940

## 2020-08-31 ENCOUNTER — Ambulatory Visit: Payer: Medicare Other

## 2020-09-02 ENCOUNTER — Other Ambulatory Visit: Payer: Self-pay

## 2020-09-02 ENCOUNTER — Ambulatory Visit: Payer: Medicare Other

## 2020-09-02 DIAGNOSIS — M6281 Muscle weakness (generalized): Secondary | ICD-10-CM | POA: Diagnosis not present

## 2020-09-02 DIAGNOSIS — R2689 Other abnormalities of gait and mobility: Secondary | ICD-10-CM

## 2020-09-02 DIAGNOSIS — M25551 Pain in right hip: Secondary | ICD-10-CM

## 2020-09-02 NOTE — Therapy (Addendum)
[place] MAIN [facility] SERVICES [address] [place], [place], [address] Phone: [phone]   Fax:  [phone]  Physical Therapy Treatment/Re-certification [id number]/[id number]  Patient Details  Name: Michael Cohen MRN: [id number] Date of Birth: [birth year] Referring Provider (PT): [name]   Encounter Date: 09/02/2020  PT End of Session - 09/02/20 1445   Visit Number 15   Number of Visits 17   Date for PT Re-Evaluation 08/31/20   Authorization Type eval: 07/06/20   PT Start Time 0230   PT Stop Time 0310   PT Time Calculation (min) 40 min   Equipment Utilized During Treatment Gait belt   Activity Tolerance Patient tolerated treatment well   Behavior During Therapy [facility] for tasks assessed/performed       Past Medical History:  Diagnosis Date  . Diabetes mellitus without complication ([place])   . Hypercholesteremia   . Hypertension     Past Surgical History:  Procedure Laterality Date  . CATARACT EXTRACTION Bilateral   . COLONOSCOPY WITH PROPOFOL N/A 12/12/2017   Procedure: COLONOSCOPY WITH PROPOFOL;  Surgeon: [name], [name], MD;  Location: [facility] ENDOSCOPY;  Service: Gastroenterology;  Laterality: N/A;  . EYE SURGERY        Subjective Assessment - 09/02/20 1631    Subjective The patient reports he is doing well has intermittent low back pain.    Pertinent History 80 y.o. 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 His pain began around a month ago and is located over the proximal lateral thigh and in the right buttocks. He describes his pain as achy more than sharp. He is not sure of any aggravating activities. He reports of having some pain at rest today. He reports associated pain or "tightness" felt along anterior and lateral portions of the lower leg. He was previously evaluated by [name] in podiatry and diagnosed with tibialis tendinitis. He denies associated groin  pain, denies radicular symptoms. He has tried Baclofen without any relief of his symptoms. Of note, the patient states that he was scheduled to go on a cruise this week but was canceled due to the pain he is experiencing. He states that he would be unable to walk around and participate fully in his vacation given his pain. An anteroposterior view of the pelvis and anteroposterior and lateral views of the right hip were obtained. Images reveal mild loss of femoral acetabular joint space with mild osteophyte formation noted. CAM deformity of the right hip noted. No fractures or dislocations. Anteroposterior, oblique, and lateral views of the lumbar spine were obtained. Images reveal no scoliosis. Mild loss of lumbar lordosis noted. Significant loss of disc space noted at L4-L5 and L5-S1. Intervertebral osteophyte formation of L4 and L5. No fractures or wedging noted. Calcification of the abdominal aorta noted. Degeneration of the lumbar facets noted. Patient was recently diagnosed with lumbar radiculoapthy due to symptoms being more consistent vs hip pathology given the absence of hip symptoms with provocative maneuvers. Prednisone tapers have been considered but is on hold for now due to patient's diabetes.    Limitations Walking;Standing;Lifting;Sitting    How long can you sit comfortably? Few hours    How long can you stand comfortably? 30 mins    How long can you walk comfortably? 1 hour    Currently in Pain? No/denies    Pain Score 0-No pain          There were no vitals  filed for this visit.    Nu Step x L1 x 6 mins    Sit to stands x20 hand support on thighs   Precor BLE leg press x 55# several reps/to fatigue   Neuro Re Ed:   Airex x NBOS x EO/EC 30s each without BUE support  Airex x WBOS x EO/EC 30s each without BUE support  Airex tandem balance EO 30"x2 each with intermittent UE support   Tux Racer Bronze Race- bilateral UE support                      PT  Short Term Goals - 09/08/20 1541      PT SHORT TERM GOAL #1   Title Patient will be independent in home exercise program to improve strength/mobility for better functional independence with ADLs.    Baseline patient is compliant with all his HEP; attending water aerobics on days he's not coming to therapy.    Time 4    Period Weeks    Status Achieved    Target Date 08/03/20      PT SHORT TERM GOAL #2   Title Patient will increase RLE gross strength to 4+/5 as to improve functional strength for independent gait, increased standing tolerance and increased ADL ability.    Baseline 12/21: RLE gross strength 4/5 2/23: see note    Time 4    Period Weeks    Status On-going    Target Date 08/03/20             PT Long Term Goals - 09/08/20 1541      PT LONG TERM GOAL #1   Title Patient will increase FOTO score to equal to or greater than 67 to demonstrate statistically significant improvement in mobility and quality of life.    Baseline 12/21: 57 2/23: 74%    Time 8    Period Weeks    Status Achieved    Target Date 10/26/20      PT LONG TERM GOAL #2   Title Patient (> 12 years old) will complete five times sit to stand test in < 15 seconds indicating an increased LE strength and improved balance.    Baseline 12/21: 17.65s, 01/26: 14.47s    Time 8    Period Weeks    Status Achieved      PT LONG TERM GOAL #3   Title Patient will increase six minute walk test distance to >1000 for progression to community ambulator and improve gait ability    Baseline 12/21: 500, 01/26: 655 ft 2/23: 735 ft    Time 8    Period Weeks    Status Partially Met    Target Date 10/26/20      PT LONG TERM GOAL #4   Title Patient will reduce timed up and go to <11 seconds to reduce fall risk and demonstrate improved transfer/gait ability.    Baseline 12/21: 16.78s, 01/26: 12.08s 2/23: 10 sec    Time 8    Period Weeks    Status Achieved      PT LONG TERM GOAL #5   Title Patient will increase lower  extremity functional scale to >60/80 to demonstrate improved functional mobility and increased tolerance with ADLs.    Baseline 12/21: 50/80, 01/26: 62/80    Time 8    Period Weeks    Status Achieved                  Patient will benefit from  skilled therapeutic intervention in order to improve the following deficits and impairments:  Abnormal gait,Decreased endurance,Impaired sensation,Decreased activity tolerance,Decreased strength,Decreased balance,Decreased mobility,Difficulty walking,Decreased range of motion,Decreased safety awareness,Improper body mechanics  Visit Diagnosis: Muscle weakness (generalized)  Other abnormalities of gait and mobility  Pain in right hip     Problem List There are no problems to display for this patient.   Hal Morales PT, DPT 09/13/2020, 1:21 PM  [place] MAIN [facility] SERVICES [address] [place], [place], [address] Phone: [phone]   Fax:  [phone]  Name: Jessejames Steelman MRN: [id number] Date of Birth: September 22, 1940

## 2020-09-07 ENCOUNTER — Ambulatory Visit: Payer: Medicare Other

## 2020-09-08 ENCOUNTER — Other Ambulatory Visit: Payer: Self-pay

## 2020-09-08 ENCOUNTER — Ambulatory Visit: Payer: Medicare Other

## 2020-09-08 DIAGNOSIS — M25551 Pain in right hip: Secondary | ICD-10-CM

## 2020-09-08 DIAGNOSIS — R2689 Other abnormalities of gait and mobility: Secondary | ICD-10-CM

## 2020-09-08 DIAGNOSIS — M6281 Muscle weakness (generalized): Secondary | ICD-10-CM | POA: Diagnosis not present

## 2020-09-08 NOTE — Therapy (Signed)
[place] MAIN [facility] SERVICES [address] [place], [place], [address] Phone: [phone]   Fax:  [phone]  Physical Therapy Treatment  Patient Details  Name: Michael Cohen MRN: [id number] Date of Birth: 1941/06/10 Referring Provider (PT): [name]   Encounter Date: 09/08/2020   PT End of Session - 09/08/20 1540    Visit Number 16    Number of Visits 17    Date for PT Re-Evaluation 08/31/20    Authorization Type eval: 07/06/20    PT Start Time 1345    PT Stop Time 1425    PT Time Calculation (min) 40 min    Equipment Utilized During Treatment Gait belt    Activity Tolerance Patient tolerated treatment well    Behavior During Therapy [facility] for tasks assessed/performed           Past Medical History:  Diagnosis Date  . Diabetes mellitus without complication ([place])   . Hypercholesteremia   . Hypertension     Past Surgical History:  Procedure Laterality Date  . CATARACT EXTRACTION Bilateral   . COLONOSCOPY WITH PROPOFOL N/A 12/12/2017   Procedure: COLONOSCOPY WITH PROPOFOL;  Surgeon: [name], [name], MD;  Location: [facility] ENDOSCOPY;  Service: Gastroenterology;  Laterality: N/A;  . EYE SURGERY      There were no vitals filed for this visit.   Subjective Assessment - 09/08/20 1539    Subjective Patient reports his hip is improving. Has been compliant with some of his HEP. No falls or LOB since last session.    Pertinent History 80 y.o. 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 His pain began around a month ago and is located over the proximal lateral thigh and in the right buttocks. He describes his pain as achy more than sharp. He is not sure of any aggravating activities. He reports of having some pain at rest today. He reports associated pain or "tightness" felt along anterior and lateral portions of the lower leg. He was previously evaluated by [name] in  podiatry and diagnosed with tibialis tendinitis. He denies associated groin pain, denies radicular symptoms. He has tried Baclofen without any relief of his symptoms. Of note, the patient states that he was scheduled to go on a cruise this week but was canceled due to the pain he is experiencing. He states that he would be unable to walk around and participate fully in his vacation given his pain. An anteroposterior view of the pelvis and anteroposterior and lateral views of the right hip were obtained. Images reveal mild loss of femoral acetabular joint space with mild osteophyte formation noted. CAM deformity of the right hip noted. No fractures or dislocations. Anteroposterior, oblique, and lateral views of the lumbar spine were obtained. Images reveal no scoliosis. Mild loss of lumbar lordosis noted. Significant loss of disc space noted at L4-L5 and L5-S1. Intervertebral osteophyte formation of L4 and L5. No fractures or wedging noted. Calcification of the abdominal aorta noted. Degeneration of the lumbar facets noted. Patient was recently diagnosed with lumbar radiculoapthy due to symptoms being more consistent vs hip pathology given the absence of hip symptoms with provocative maneuvers. Prednisone tapers have been considered but is on hold for now due to patient's diabetes.    Limitations Walking;Standing;Lifting;Sitting    How long can you sit comfortably? Few hours    How long can you stand comfortably? 30 mins    How long can you  walk comfortably? 1 hour    Currently in Pain? No/denies               Goals:  BLE strength  Right Left  Hip flexion 4+ 4+  Hip Abduction 4 4+  Hip Adduction 4 4+  Knee Extension  4+ 4+  Knee Flexion 4 4+     FOTO: 74%  6 min walk test: 735 ft TUG: 10.06 seconds    Treatment:  Sit to stand reach into basket grab a ball and toss into hoop x15 Seated GTB hamstring curls 15x each LE Seated GTB adduction 15x each LE      Pt educated throughout  session about proper posture and technique with exercises. Improved exercise technique, movement at target joints, use of target muscles after min to mod verbal, visual, tactile cues   Patient is progressing towards functional goals; meeting his FOTO goal and TUG goal as well asmaking significant progress towards 6 minute walk test.  Patient will continue to benefit from skilled therapy to focus on capacity of ambulation, decreased pain levels with ambulation, and improved strength.               PT Education - 09/08/20 1539    Education Details goals    Person(s) Educated Patient    Methods Explanation;Demonstration;Tactile cues;Verbal cues    Comprehension Verbalized understanding;Returned demonstration;Verbal cues required;Tactile cues required            PT Short Term Goals - 09/08/20 1541      PT SHORT TERM GOAL #1   Title Patient will be independent in home exercise program to improve strength/mobility for better functional independence with ADLs.    Baseline patient is compliant with all his HEP; attending water aerobics on days he's not coming to therapy.    Time 4    Period Weeks    Status Achieved    Target Date 08/03/20      PT SHORT TERM GOAL #2   Title Patient will increase RLE gross strength to 4+/5 as to improve functional strength for independent gait, increased standing tolerance and increased ADL ability.    Baseline 12/21: RLE gross strength 4/5 2/23: see note    Time 4    Period Weeks    Status On-going    Target Date 08/03/20             PT Long Term Goals - 09/08/20 1541      PT LONG TERM GOAL #1   Title Patient will increase FOTO score to equal to or greater than 67 to demonstrate statistically significant improvement in mobility and quality of life.    Baseline 12/21: 57 2/23: 74%    Time 8    Period Weeks    Status Achieved    Target Date 10/26/20      PT LONG TERM GOAL #2   Title Patient (> 11 years old) will complete five times  sit to stand test in < 15 seconds indicating an increased LE strength and improved balance.    Baseline 12/21: 17.65s, 01/26: 14.47s    Time 8    Period Weeks    Status Achieved      PT LONG TERM GOAL #3   Title Patient will increase six minute walk test distance to >1000 for progression to community ambulator and improve gait ability    Baseline 12/21: 500, 01/26: 655 ft 2/23: 735 ft    Time 8    Period Weeks  Status Partially Met    Target Date 10/26/20      PT LONG TERM GOAL #4   Title Patient will reduce timed up and go to <11 seconds to reduce fall risk and demonstrate improved transfer/gait ability.    Baseline 12/21: 16.78s, 01/26: 12.08s 2/23: 10 sec    Time 8    Period Weeks    Status Achieved      PT LONG TERM GOAL #5   Title Patient will increase lower extremity functional scale to >60/80 to demonstrate improved functional mobility and increased tolerance with ADLs.    Baseline 12/21: 50/80, 01/26: 62/80    Time 8    Period Weeks    Status Achieved                  Patient will benefit from skilled therapeutic intervention in order to improve the following deficits and impairments:     Visit Diagnosis: Muscle weakness (generalized)  Other abnormalities of gait and mobility  Pain in right hip     Problem List There are no problems to display for this patient.  Janna Arch, PT, DPT   09/08/2020, 3:45 PM  [place] MAIN [facility] SERVICES [address] [place], [place], [address] Phone: [phone]   Fax:  [phone]  Name: Elian Gloster MRN: [id number] Date of Birth: 05-05-1941

## 2020-09-09 ENCOUNTER — Ambulatory Visit: Payer: Medicare Other

## 2020-09-13 ENCOUNTER — Ambulatory Visit: Payer: Medicare Other

## 2020-09-13 ENCOUNTER — Other Ambulatory Visit: Payer: Self-pay

## 2020-09-13 DIAGNOSIS — M25551 Pain in right hip: Secondary | ICD-10-CM

## 2020-09-13 DIAGNOSIS — M6281 Muscle weakness (generalized): Secondary | ICD-10-CM

## 2020-09-13 DIAGNOSIS — R269 Unspecified abnormalities of gait and mobility: Secondary | ICD-10-CM

## 2020-09-13 NOTE — Therapy (Signed)
[place] MAIN [facility] SERVICES [address] [place], [place], [address] Phone: [phone]   Fax:  [phone]  Physical Therapy Treatment  Patient Details  Name: Michael Cohen MRN: [id number] Date of Birth: 10-Oct-1940 Referring Provider (PT): [name]   Encounter Date: 09/13/2020   PT End of Session - 09/13/20 1352    Visit Number 17    Number of Visits 25    Date for PT Re-Evaluation 10/26/20    Authorization Type eval: 07/06/20    PT Start Time 1346    PT Stop Time 1429    PT Time Calculation (min) 43 min    Equipment Utilized During Treatment Gait belt    Activity Tolerance Patient tolerated treatment well    Behavior During Therapy [facility] for tasks assessed/performed           Past Medical History:  Diagnosis Date  . Diabetes mellitus without complication ([place])   . Hypercholesteremia   . Hypertension     Past Surgical History:  Procedure Laterality Date  . CATARACT EXTRACTION Bilateral   . COLONOSCOPY WITH PROPOFOL N/A 12/12/2017   Procedure: COLONOSCOPY WITH PROPOFOL;  Surgeon: [name], [name], MD;  Location: [facility] ENDOSCOPY;  Service: Gastroenterology;  Laterality: N/A;  . EYE SURGERY      There were no vitals filed for this visit.   Subjective Assessment - 09/13/20 1351    Subjective Pt reports he has no R hip pain and has been on 2 walks this morning with his dog.    Pertinent History 80 y.o. 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 His pain began around a month ago and is located over the proximal lateral thigh and in the right buttocks. He describes his pain as achy more than sharp. He is not sure of any aggravating activities. He reports of having some pain at rest today. He reports associated pain or "tightness" felt along anterior and lateral portions of the lower leg. He was previously evaluated by [name] in podiatry and diagnosed with tibialis  tendinitis. He denies associated groin pain, denies radicular symptoms. He has tried Baclofen without any relief of his symptoms. Of note, the patient states that he was scheduled to go on a cruise this week but was canceled due to the pain he is experiencing. He states that he would be unable to walk around and participate fully in his vacation given his pain. An anteroposterior view of the pelvis and anteroposterior and lateral views of the right hip were obtained. Images reveal mild loss of femoral acetabular joint space with mild osteophyte formation noted. CAM deformity of the right hip noted. No fractures or dislocations. Anteroposterior, oblique, and lateral views of the lumbar spine were obtained. Images reveal no scoliosis. Mild loss of lumbar lordosis noted. Significant loss of disc space noted at L4-L5 and L5-S1. Intervertebral osteophyte formation of L4 and L5. No fractures or wedging noted. Calcification of the abdominal aorta noted. Degeneration of the lumbar facets noted. Patient was recently diagnosed with lumbar radiculoapthy due to symptoms being more consistent vs hip pathology given the absence of hip symptoms with provocative maneuvers. Prednisone tapers have been considered but is on hold for now due to patient's diabetes.    Limitations Walking;Standing;Lifting;Sitting    How long can you sit comfortably? Few hours    How long can you stand comfortably? 30 mins    How long can you walk comfortably?  1 hour    Currently in Pain? No/denies    Pain Score 0-No pain           Therex:   Nu-Step L3 with UE/LE use for 5 min  STS from standard height chair: 2x10, hands on ant thighs  STS with ball toss to challenge balance: 2x10, 1 kg ball  Alternating orange hurdle steps to L/R with BUE support for balance on // bar. 5 lbs AW's: 2x10. VC/TC for correct form.   Neuro Re-Ed:   On airex pad   Standing with narrow BOS: x30 sec, SBA+1   Alternating SLS: unable to hold without 2 finger  support. Regressed to level surface. Pt displayed ability to maintain balance for 2-3 sec/LE before needing use of single UE to correct LOB. Significant sway and noted difficulty. CGA+1   Balloon taps while on airex pad to challenge dynamic balance outside BOS: 2x30 sec CGA+1       PT Education - 09/13/20 1352    Education Details exercise technique    Person(s) Educated Patient    Methods Explanation;Demonstration;Tactile cues;Verbal cues    Comprehension Verbalized understanding;Returned demonstration            PT Short Term Goals - 09/08/20 1541      PT SHORT TERM GOAL #1   Title Patient will be independent in home exercise program to improve strength/mobility for better functional independence with ADLs.    Baseline patient is compliant with all his HEP; attending water aerobics on days he's not coming to therapy.    Time 4    Period Weeks    Status Achieved    Target Date 08/03/20      PT SHORT TERM GOAL #2   Title Patient will increase RLE gross strength to 4+/5 as to improve functional strength for independent gait, increased standing tolerance and increased ADL ability.    Baseline 12/21: RLE gross strength 4/5 2/23: see note    Time 4    Period Weeks    Status On-going    Target Date 08/03/20             PT Long Term Goals - 09/08/20 1541      PT LONG TERM GOAL #1   Title Patient will increase FOTO score to equal to or greater than 67 to demonstrate statistically significant improvement in mobility and quality of life.    Baseline 12/21: 57 2/23: 74%    Time 8    Period Weeks    Status Achieved    Target Date 10/26/20      PT LONG TERM GOAL #2   Title Patient (> 66 years old) will complete five times sit to stand test in < 15 seconds indicating an increased LE strength and improved balance.    Baseline 12/21: 17.65s, 01/26: 14.47s    Time 8    Period Weeks    Status Achieved      PT LONG TERM GOAL #3   Title Patient will increase six minute walk test  distance to >1000 for progression to community ambulator and improve gait ability    Baseline 12/21: 500, 01/26: 655 ft 2/23: 735 ft    Time 8    Period Weeks    Status Partially Met    Target Date 10/26/20      PT LONG TERM GOAL #4   Title Patient will reduce timed up and go to <11 seconds to reduce fall risk and demonstrate improved transfer/gait ability.  Baseline 12/21: 16.78s, 01/26: 12.08s 2/23: 10 sec    Time 8    Period Weeks    Status Achieved      PT LONG TERM GOAL #5   Title Patient will increase lower extremity functional scale to >60/80 to demonstrate improved functional mobility and increased tolerance with ADLs.    Baseline 12/21: 50/80, 01/26: 62/80    Time 8    Period Weeks    Status Achieved                 Plan - 09/13/20 1434    Clinical Impression Statement Today's session with focus on LE strength and balance. Pt displaying good form/technique with STS and STS ball tosses with maintaining neutral knee alignment. Pt requires 2 finger support with SLS on level surface and only able to hold 2-3 sec on each LE with lat sway and need of single UE to correct LOB. Pt progressing with POC and can continue to benefit from skilled PT treatment to improve functional mobility.    Personal Factors and Comorbidities Comorbidity 3+;Time since onset of injury/illness/exacerbation    Comorbidities diabetes, hypertension, hyperlipidemia    Examination-Activity Limitations Squat;Stairs;Transfers    Examination-Participation Restrictions Church    Stability/Clinical Decision Making Evolving/Moderate complexity    Rehab Potential Fair    PT Frequency 2x / week    PT Duration 8 weeks    PT Treatment/Interventions Moist Heat;Electrical Stimulation;Gait training;Stair training;Functional mobility training;Therapeutic activities;Therapeutic exercise;Balance training;Neuromuscular re-education;Patient/family education;Manual techniques;Passive range of motion;Energy conservation     PT Next Visit Plan Dynamic balance and proximal hip strengthening.    PT Home Exercise Plan Hamstring stretch, piriformis stretch    Consulted and Agree with Plan of Care Patient           Patient will benefit from skilled therapeutic intervention in order to improve the following deficits and impairments:  Abnormal gait,Decreased endurance,Impaired sensation,Decreased activity tolerance,Decreased strength,Decreased balance,Decreased mobility,Difficulty walking,Decreased range of motion,Decreased safety awareness,Improper body mechanics  Visit Diagnosis: Muscle weakness (generalized)  Gait abnormality  Pain in right hip     Problem List There are no problems to display for this patient.   Sidney, IV, PT,DPT 09/13/2020, 2:45 PM  [place] MAIN [facility] SERVICES [address] [place], [place], [address] Phone: [phone]   Fax:  [phone]  Name: Michael Cohen MRN: [id number] Date of Birth: September 04, 1940

## 2020-09-13 NOTE — Addendum Note (Signed)
Addended by: Hal Morales on: 09/13/2020 01:24 PM   Modules accepted: Orders

## 2020-09-15 ENCOUNTER — Other Ambulatory Visit: Payer: Self-pay

## 2020-09-15 ENCOUNTER — Ambulatory Visit: Payer: Medicare Other | Attending: Podiatry

## 2020-09-15 DIAGNOSIS — M6281 Muscle weakness (generalized): Secondary | ICD-10-CM | POA: Diagnosis present

## 2020-09-15 DIAGNOSIS — R269 Unspecified abnormalities of gait and mobility: Secondary | ICD-10-CM | POA: Insufficient documentation

## 2020-09-15 DIAGNOSIS — M25551 Pain in right hip: Secondary | ICD-10-CM | POA: Diagnosis present

## 2020-09-15 DIAGNOSIS — R2689 Other abnormalities of gait and mobility: Secondary | ICD-10-CM | POA: Insufficient documentation

## 2020-09-15 DIAGNOSIS — R2681 Unsteadiness on feet: Secondary | ICD-10-CM | POA: Diagnosis present

## 2020-09-15 NOTE — Therapy (Signed)
[place] MAIN [facility] SERVICES [address] [place], [place], [address] Phone: [phone]   Fax:  [phone]  Physical Therapy Treatment  Patient Details  Name: Michael Cohen MRN: [id number] Date of Birth: May 22, 1941 Referring Provider (PT): [name]   Encounter Date: 09/15/2020   PT End of Session - 09/15/20 1400    Visit Number 18    Number of Visits 25    Date for PT Re-Evaluation 10/26/20    Authorization Type eval: 07/06/20    PT Start Time 4492    PT Stop Time 1431    PT Time Calculation (min) 39 min    Equipment Utilized During Treatment Gait belt    Activity Tolerance Patient tolerated treatment well    Behavior During Therapy [facility] for tasks assessed/performed           Past Medical History:  Diagnosis Date  . Diabetes mellitus without complication ([place])   . Hypercholesteremia   . Hypertension     Past Surgical History:  Procedure Laterality Date  . CATARACT EXTRACTION Bilateral   . COLONOSCOPY WITH PROPOFOL N/A 12/12/2017   Procedure: COLONOSCOPY WITH PROPOFOL;  Surgeon: [name], [name], MD;  Location: [facility] ENDOSCOPY;  Service: Gastroenterology;  Laterality: N/A;  . EYE SURGERY      There were no vitals filed for this visit.  There.ex:    Nu-Step L4 for 5 min. UE/LE use for SPM > 60 for LE strength/endurance.   Alternating step ups on 6" step for hip strength on stairs. BUE support for balance: 2x12/LE. Min verbal and tactile cues with good carryover after exercise.   Alternating hip abduction with 5# Aw's: 2x12. Verbal cues for upright posture and form. Good carryover after cuing  Seated Resisted GTB hip ER: 1x20, 5 sec holds during concentric portion. VC's for control during eccentric portion with good carryover.   Alternating side steps with airex beam with 1000 gm med ball tosses: x4 for hip stabilization with added perturbations on unstable surface. CGA+1 for safety. Intermittent SUE support.       Subjective Assessment - 09/15/20 1358    Subjective Pt reports R hip pain yesterday after taking his dog to visit SNF for therapy. Reports he may have "over done it". Currently no R hip pain. Stiffnes in low back.    Pertinent History 80 y.o. 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 His pain began around a month ago and is located over the proximal lateral thigh and in the right buttocks. He describes his pain as achy more than sharp. He is not sure of any aggravating activities. He reports of having some pain at rest today. He reports associated pain or "tightness" felt along anterior and lateral portions of the lower leg. He was previously evaluated by [name] in podiatry and diagnosed with tibialis tendinitis. He denies associated groin pain, denies radicular symptoms. He has tried Baclofen without any relief of his symptoms. Of note, the patient states that he was scheduled to go on a cruise this week but was canceled due to the pain he is experiencing. He states that he would be unable to walk around and participate fully in his vacation given his pain. An anteroposterior view of the pelvis and anteroposterior and lateral views of the right hip were obtained. Images reveal mild loss of femoral acetabular joint space with mild osteophyte formation noted. CAM deformity of the right hip noted. No  fractures or dislocations. Anteroposterior, oblique, and lateral views of the lumbar spine were obtained. Images reveal no scoliosis. Mild loss of lumbar lordosis noted. Significant loss of disc space noted at L4-L5 and L5-S1. Intervertebral osteophyte formation of L4 and L5. No fractures or wedging noted. Calcification of the abdominal aorta noted. Degeneration of the lumbar facets noted. Patient was recently diagnosed with lumbar radiculoapthy due to symptoms being more consistent vs hip pathology given the absence of hip symptoms with provocative  maneuvers. Prednisone tapers have been considered but is on hold for now due to patient's diabetes.    Limitations Walking;Standing;Lifting;Sitting    How long can you sit comfortably? Few hours    How long can you stand comfortably? 30 mins    How long can you walk comfortably? 1 hour    Currently in Pain? No/denies    Pain Score 0-No pain                                     PT Education - 09/15/20 1400    Education Details form/technique with exercise.    Person(s) Educated Patient    Methods Explanation;Demonstration;Tactile cues;Verbal cues    Comprehension Verbalized understanding;Returned demonstration            PT Short Term Goals - 09/08/20 1541      PT SHORT TERM GOAL #1   Title Patient will be independent in home exercise program to improve strength/mobility for better functional independence with ADLs.    Baseline patient is compliant with all his HEP; attending water aerobics on days he's not coming to therapy.    Time 4    Period Weeks    Status Achieved    Target Date 08/03/20      PT SHORT TERM GOAL #2   Title Patient will increase RLE gross strength to 4+/5 as to improve functional strength for independent gait, increased standing tolerance and increased ADL ability.    Baseline 12/21: RLE gross strength 4/5 2/23: see note    Time 4    Period Weeks    Status On-going    Target Date 08/03/20             PT Long Term Goals - 09/08/20 1541      PT LONG TERM GOAL #1   Title Patient will increase FOTO score to equal to or greater than 67 to demonstrate statistically significant improvement in mobility and quality of life.    Baseline 12/21: 57 2/23: 74%    Time 8    Period Weeks    Status Achieved    Target Date 10/26/20      PT LONG TERM GOAL #2   Title Patient (> 35 years old) will complete five times sit to stand test in < 15 seconds indicating an increased LE strength and improved balance.    Baseline 12/21: 17.65s,  01/26: 14.47s    Time 8    Period Weeks    Status Achieved      PT LONG TERM GOAL #3   Title Patient will increase six minute walk test distance to >1000 for progression to community ambulator and improve gait ability    Baseline 12/21: 500, 01/26: 655 ft 2/23: 735 ft    Time 8    Period Weeks    Status Partially Met    Target Date 10/26/20      PT LONG TERM GOAL #  4   Title Patient will reduce timed up and go to <11 seconds to reduce fall risk and demonstrate improved transfer/gait ability.    Baseline 12/21: 16.78s, 01/26: 12.08s 2/23: 10 sec    Time 8    Period Weeks    Status Achieved      PT LONG TERM GOAL #5   Title Patient will increase lower extremity functional scale to >60/80 to demonstrate improved functional mobility and increased tolerance with ADLs.    Baseline 12/21: 50/80, 01/26: 62/80    Time 8    Period Weeks    Status Achieved                 Plan - 09/15/20 1437    Clinical Impression Statement Pt displaying good tolerance to LE strengthening and hip stabilization exercises with no R hip pain. Pt requiring min VC's for quality of movement for targeted muscles with good to excellent carryover after cuing. Pt continues to amb with slight forward head posture and limited hip extension on RLE but is pain free. Pt can continue to benefit from further skilled PT treatment to improve functional mobiltiy and target goals.    Personal Factors and Comorbidities Comorbidity 3+;Time since onset of injury/illness/exacerbation    Comorbidities diabetes, hypertension, hyperlipidemia    Examination-Activity Limitations Squat;Stairs;Transfers    Examination-Participation Restrictions Church    Stability/Clinical Decision Making Evolving/Moderate complexity    Rehab Potential Fair    PT Frequency 2x / week    PT Duration 8 weeks    PT Treatment/Interventions Moist Heat;Electrical Stimulation;Gait training;Stair training;Functional mobility training;Therapeutic  activities;Therapeutic exercise;Balance training;Neuromuscular re-education;Patient/family education;Manual techniques;Passive range of motion;Energy conservation    PT Next Visit Plan Hip extension    PT Home Exercise Plan Hamstring stretch, piriformis stretch    Consulted and Agree with Plan of Care Patient           Patient will benefit from skilled therapeutic intervention in order to improve the following deficits and impairments:  Abnormal gait,Decreased endurance,Impaired sensation,Decreased activity tolerance,Decreased strength,Decreased balance,Decreased mobility,Difficulty walking,Decreased range of motion,Decreased safety awareness,Improper body mechanics  Visit Diagnosis: Muscle weakness (generalized)  Gait abnormality  Pain in right hip  Other abnormalities of gait and mobility     Problem List There are no problems to display for this patient.   Salem Caster. Fairly IV, PT, DPT Physical Therapist- [facility]  09/15/2020, 2:43 PM  [place] MAIN [facility] SERVICES [address] [place], [place], [address] Phone: [phone]   Fax:  [phone]  Name: Michael Cohen MRN: [id number] Date of Birth: 04/27/1941

## 2020-09-20 ENCOUNTER — Ambulatory Visit: Payer: Medicare Other

## 2020-09-20 ENCOUNTER — Other Ambulatory Visit: Payer: Self-pay

## 2020-09-20 DIAGNOSIS — M6281 Muscle weakness (generalized): Secondary | ICD-10-CM

## 2020-09-20 DIAGNOSIS — M25551 Pain in right hip: Secondary | ICD-10-CM

## 2020-09-20 DIAGNOSIS — R269 Unspecified abnormalities of gait and mobility: Secondary | ICD-10-CM

## 2020-09-20 DIAGNOSIS — R2689 Other abnormalities of gait and mobility: Secondary | ICD-10-CM

## 2020-09-20 NOTE — Therapy (Signed)
[place] MAIN [facility] SERVICES [address] [place], [place], [address] Phone: [phone]   Fax:  [phone]  Physical Therapy Treatment  Patient Details  Name: Michael Cohen MRN: [id number] Date of Birth: Jan 16, 1941 Referring Provider (PT): [name]   Encounter Date: 09/20/2020   PT End of Session - 09/20/20 1355    Visit Number 19    Number of Visits 25    Date for PT Re-Evaluation 10/26/20    Authorization Type eval: 07/06/20    PT Start Time 1345    PT Stop Time 1429    PT Time Calculation (min) 44 min    Equipment Utilized During Treatment Gait belt    Activity Tolerance Patient tolerated treatment well    Behavior During Therapy [facility] for tasks assessed/performed           Past Medical History:  Diagnosis Date  . Diabetes mellitus without complication ([place])   . Hypercholesteremia   . Hypertension     Past Surgical History:  Procedure Laterality Date  . CATARACT EXTRACTION Bilateral   . COLONOSCOPY WITH PROPOFOL N/A 12/12/2017   Procedure: COLONOSCOPY WITH PROPOFOL;  Surgeon: [name], [name], MD;  Location: [facility] ENDOSCOPY;  Service: Gastroenterology;  Laterality: N/A;  . EYE SURGERY      There were no vitals filed for this visit.   Subjective Assessment - 09/20/20 1354    Subjective Patient reports having a good weekend, went to the theater this weekend. No pain at this time but occasionally gets a deep pain in L hip.    Pertinent History 80 y.o. 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 His pain began around a month ago and is located over the proximal lateral thigh and in the right buttocks. He describes his pain as achy more than sharp. He is not sure of any aggravating activities. He reports of having some pain at rest today. He reports associated pain or "tightness" felt along anterior and lateral portions of the lower leg. He was previously evaluated  by [name] in podiatry and diagnosed with tibialis tendinitis. He denies associated groin pain, denies radicular symptoms. He has tried Baclofen without any relief of his symptoms. Of note, the patient states that he was scheduled to go on a cruise this week but was canceled due to the pain he is experiencing. He states that he would be unable to walk around and participate fully in his vacation given his pain. An anteroposterior view of the pelvis and anteroposterior and lateral views of the right hip were obtained. Images reveal mild loss of femoral acetabular joint space with mild osteophyte formation noted. CAM deformity of the right hip noted. No fractures or dislocations. Anteroposterior, oblique, and lateral views of the lumbar spine were obtained. Images reveal no scoliosis. Mild loss of lumbar lordosis noted. Significant loss of disc space noted at L4-L5 and L5-S1. Intervertebral osteophyte formation of L4 and L5. No fractures or wedging noted. Calcification of the abdominal aorta noted. Degeneration of the lumbar facets noted. Patient was recently diagnosed with lumbar radiculoapthy due to symptoms being more consistent vs hip pathology given the absence of hip symptoms with provocative maneuvers. Prednisone tapers have been considered but is on hold for now due to patient's diabetes.    Limitations Walking;Standing;Lifting;Sitting    How long can you sit comfortably? Few hours    How long can you stand comfortably? 30 mins  How long can you walk comfortably? 1 hour    Currently in Pain? No/denies               Therex:              Nu-Step L3 with UE/LE use for 4 minutes for cardiovascular and musculoskeletal challenge Rpm> 50              STS from standard height chair: 2x10, hands on ant thighs             standing hip flexor stretch 2x 30 second hold.              Alternating orange hurdle steps to L/R with BUE support for balance on // bar.   alternating orange hurdle  forward/backwards UE support, 10x each LE     Neuro Re-Ed:  airex balance beam:  -lateral stepping 4x  Length of // bars -tandem walking 6x length of // bars          airex pad -Standing with narrow BOS: x30 sec, SBA+1                           Assessment of L Hip pain:  FABER : tight but non painful FAIR: painful  SLR painful Distraction pain relieving  Pt educated throughout session about proper posture and technique with exercises. Improved exercise technique, movement at target joints, use of target muscles after min to mod verbal, visual, tactile cues  Patient presents with new onset of intermittent L hip pain, reports his wife wants him to get it "checked out". Distraction is pain relieving and special tests are indicative of joint narrowing and muscle guarding. Patient educated on stretching and if persists to follow up with physician. Pt can continue to benefit from further skilled PT treatment to improve functional mobiltiy and target goals.                      PT Education - 09/20/20 1355    Education Details exercise technique, body mechanics    Person(s) Educated Patient    Methods Explanation;Tactile cues;Demonstration;Verbal cues    Comprehension Verbalized understanding;Returned demonstration;Verbal cues required;Tactile cues required            PT Short Term Goals - 09/08/20 1541      PT SHORT TERM GOAL #1   Title Patient will be independent in home exercise program to improve strength/mobility for better functional independence with ADLs.    Baseline patient is compliant with all his HEP; attending water aerobics on days he's not coming to therapy.    Time 4    Period Weeks    Status Achieved    Target Date 08/03/20      PT SHORT TERM GOAL #2   Title Patient will increase RLE gross strength to 4+/5 as to improve functional strength for independent gait, increased standing tolerance and increased ADL ability.    Baseline 12/21: RLE gross  strength 4/5 2/23: see note    Time 4    Period Weeks    Status On-going    Target Date 08/03/20             PT Long Term Goals - 09/08/20 1541      PT LONG TERM GOAL #1   Title Patient will increase FOTO score to equal to or greater than 67 to demonstrate statistically significant improvement in mobility and quality of life.  Baseline 12/21: 57 2/23: 74%    Time 8    Period Weeks    Status Achieved    Target Date 10/26/20      PT LONG TERM GOAL #2   Title Patient (> 92 years old) will complete five times sit to stand test in < 15 seconds indicating an increased LE strength and improved balance.    Baseline 12/21: 17.65s, 01/26: 14.47s    Time 8    Period Weeks    Status Achieved      PT LONG TERM GOAL #3   Title Patient will increase six minute walk test distance to >1000 for progression to community ambulator and improve gait ability    Baseline 12/21: 500, 01/26: 655 ft 2/23: 735 ft    Time 8    Period Weeks    Status Partially Met    Target Date 10/26/20      PT LONG TERM GOAL #4   Title Patient will reduce timed up and go to <11 seconds to reduce fall risk and demonstrate improved transfer/gait ability.    Baseline 12/21: 16.78s, 01/26: 12.08s 2/23: 10 sec    Time 8    Period Weeks    Status Achieved      PT LONG TERM GOAL #5   Title Patient will increase lower extremity functional scale to >60/80 to demonstrate improved functional mobility and increased tolerance with ADLs.    Baseline 12/21: 50/80, 01/26: 62/80    Time 8    Period Weeks    Status Achieved                 Plan - 09/20/20 1446    Clinical Impression Statement Patient presents with new onset of intermittent L hip pain, reports his wife wants him to get it "checked out". Distraction is pain relieving and special tests are indicative of joint narrowing and muscle guarding. Patient educated on stretching and if persists to follow up with physician. Pt can continue to benefit from further  skilled PT treatment to improve functional mobiltiy and target goals.    Personal Factors and Comorbidities Comorbidity 3+;Time since onset of injury/illness/exacerbation    Comorbidities diabetes, hypertension, hyperlipidemia    Examination-Activity Limitations Squat;Stairs;Transfers    Examination-Participation Restrictions Church    Stability/Clinical Decision Making Evolving/Moderate complexity    Rehab Potential Fair    PT Frequency 2x / week    PT Duration 8 weeks    PT Treatment/Interventions Moist Heat;Electrical Stimulation;Gait training;Stair training;Functional mobility training;Therapeutic activities;Therapeutic exercise;Balance training;Neuromuscular re-education;Patient/family education;Manual techniques;Passive range of motion;Energy conservation    PT Next Visit Plan Hip extension    PT Home Exercise Plan Hamstring stretch, piriformis stretch    Consulted and Agree with Plan of Care Patient           Patient will benefit from skilled therapeutic intervention in order to improve the following deficits and impairments:  Abnormal gait,Decreased endurance,Impaired sensation,Decreased activity tolerance,Decreased strength,Decreased balance,Decreased mobility,Difficulty walking,Decreased range of motion,Decreased safety awareness,Improper body mechanics  Visit Diagnosis: Muscle weakness (generalized)  Gait abnormality  Pain in right hip  Other abnormalities of gait and mobility     Problem List There are no problems to display for this patient.  Janna Arch, PT, DPT   09/20/2020, 2:48 PM  [place] MAIN [facility] SERVICES [address] [place], [place], [address] Phone: [phone]   Fax:  [phone]  Name: Koua Deeg MRN: [id number] Date of Birth: 1941-05-21

## 2020-09-22 ENCOUNTER — Ambulatory Visit: Payer: Medicare Other

## 2020-09-22 ENCOUNTER — Other Ambulatory Visit: Payer: Self-pay

## 2020-09-22 DIAGNOSIS — M25551 Pain in right hip: Secondary | ICD-10-CM

## 2020-09-22 DIAGNOSIS — M6281 Muscle weakness (generalized): Secondary | ICD-10-CM

## 2020-09-22 DIAGNOSIS — R269 Unspecified abnormalities of gait and mobility: Secondary | ICD-10-CM

## 2020-09-22 DIAGNOSIS — R2689 Other abnormalities of gait and mobility: Secondary | ICD-10-CM

## 2020-09-22 NOTE — Therapy (Signed)
[place] MAIN [facility] SERVICES [address] [place], [place], [address] Phone: [phone]   Fax:  [phone]  Physical Therapy Treatment Physical Therapy Progress Note   Dates of reporting period  08/11/20   to   09/22/20   Patient Details  Name: Michael Cohen MRN: [id number] Date of Birth: Dec 19, 1940 Referring Provider (PT): [name]   Encounter Date: 09/22/2020   PT End of Session - 09/22/20 1306    Visit Number 20    Number of Visits 25    Date for PT Re-Evaluation 10/26/20    Authorization Type eval: 07/06/20; next session 1/10 PN 3/9    PT Start Time 1300    PT Stop Time 1344    PT Time Calculation (min) 44 min    Equipment Utilized During Treatment Gait belt    Activity Tolerance Patient tolerated treatment well    Behavior During Therapy [facility] for tasks assessed/performed           Past Medical History:  Diagnosis Date  . Diabetes mellitus without complication ([place])   . Hypercholesteremia   . Hypertension     Past Surgical History:  Procedure Laterality Date  . CATARACT EXTRACTION Bilateral   . COLONOSCOPY WITH PROPOFOL N/A 12/12/2017   Procedure: COLONOSCOPY WITH PROPOFOL;  Surgeon: [name], [name], MD;  Location: [facility] ENDOSCOPY;  Service: Gastroenterology;  Laterality: N/A;  . EYE SURGERY      There were no vitals filed for this visit.   Subjective Assessment - 09/22/20 1305    Subjective Patient reports he has been feeling good after last session. No falls or LOB since last session.    Pertinent History 80 y.o. 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 His pain began around a month ago and is located over the proximal lateral thigh and in the right buttocks. He describes his pain as achy more than sharp. He is not sure of any aggravating activities. He reports of having some pain at rest today. He reports associated pain or "tightness" felt along  anterior and lateral portions of the lower leg. He was previously evaluated by [name] in podiatry and diagnosed with tibialis tendinitis. He denies associated groin pain, denies radicular symptoms. He has tried Baclofen without any relief of his symptoms. Of note, the patient states that he was scheduled to go on a cruise this week but was canceled due to the pain he is experiencing. He states that he would be unable to walk around and participate fully in his vacation given his pain. An anteroposterior view of the pelvis and anteroposterior and lateral views of the right hip were obtained. Images reveal mild loss of femoral acetabular joint space with mild osteophyte formation noted. CAM deformity of the right hip noted. No fractures or dislocations. Anteroposterior, oblique, and lateral views of the lumbar spine were obtained. Images reveal no scoliosis. Mild loss of lumbar lordosis noted. Significant loss of disc space noted at L4-L5 and L5-S1. Intervertebral osteophyte formation of L4 and L5. No fractures or wedging noted. Calcification of the abdominal aorta noted. Degeneration of the lumbar facets noted. Patient was recently diagnosed with lumbar radiculoapthy due to symptoms being more consistent vs hip pathology given the absence of hip symptoms with provocative maneuvers. Prednisone tapers have been considered but is on hold for now due to patient's diabetes.    Limitations Walking;Standing;Lifting;Sitting    How long can you sit comfortably?  Few hours    How long can you stand comfortably? 30 mins    How long can you walk comfortably? 1 hour    Currently in Pain? No/denies                 Progress note Goals performed four sessions prior on 09/08/20, please refer to this note for further details.    Porfirio Mylar Step x L3 x 4 mins; for cardiovascular strength and musculoskeletal challenge ; seat position 10, arms at 9, RPM> 60     Next to support surface: airex pad: hurdle airex  sandwhich: lateral stepping 10x each LE, BUE support airex pad: hurdle airex sandwhich:; forward stepping, SUE support 10x each LE.  airex pad: 3 cone taps SUE support, one cone knocked over,     Standing with # 3lb ankle weight: CGA for stability  -Hip extension with bilateral upper extremity support, cueing for neutral hip alignment, upright posture for optimal muscle recruitment, and sequencing, 10x each LE,  -Hip abduction with bilateral upper extremity support, cueing for neutral foot alignment for correct muscle activation, 10x each LE -Hip flexion with bilateral upper extremity support, cueing for body mechanics, speed of muscle recruitment for optimal strengthening and stabilization 10x each LE -Hamstring curl with bilateral upper extremity support, cueing for knee alignment for recruitment of hamstring musculature, 10x each LE   Seated with # 3lb ankle weights  -Seated marches with upright posture, back away from back of chair for abdominal/trunk activation/stabilization, 10x each LE -Seated LAQ with 3 second holds, 10x each LE, cueing for muscle activation and sequencing for neutral alignment -Seated IR/ER with cueing for stabilizing knee placement with lateral foot movement for optimal muscle recruitment, 10x each LE  Precor leg press: Bilateral #55; 10x , cues for velocity of muscle recruitment x 2 sets               Pt educated throughout session about proper posture and technique with exercises. Improved exercise technique, movement at target joints, use of target muscles after min to mod verbal, visual, tactile cues  Patient's condition has the potential to improve in response to therapy. Maximum improvement is yet to be obtained. The anticipated improvement is attainable and reasonable in a generally predictable time.  Patient reports he's at 70% to where he would like to be. Noticed he is getting improved walking endurance but does want to continue working on stability.      Patient tolerated standing and seated interventions with no episodes of pain or exacerbation. He remains highly motivated and eager to participate in strengthening and stability interventions. Patient's condition has the potential to improve in response to therapy. Maximum improvement is yet to be obtained. The anticipated improvement is attainable and reasonable in a generally predictable time.  Pt will benefit from further skilled therapy to increase flexibility, B LE strength and balance to improve QOL and ease with all functional activities.                       PT Education - 09/22/20 1305    Education Details exercise technique, body mechanics    Person(s) Educated Patient    Methods Explanation;Demonstration;Tactile cues;Verbal cues    Comprehension Verbalized understanding;Returned demonstration;Verbal cues required;Tactile cues required            PT Short Term Goals - 09/08/20 1541      PT SHORT TERM GOAL #1   Title Patient will be independent in home  exercise program to improve strength/mobility for better functional independence with ADLs.    Baseline patient is compliant with all his HEP; attending water aerobics on days he's not coming to therapy.    Time 4    Period Weeks    Status Achieved    Target Date 08/03/20      PT SHORT TERM GOAL #2   Title Patient will increase RLE gross strength to 4+/5 as to improve functional strength for independent gait, increased standing tolerance and increased ADL ability.    Baseline 12/21: RLE gross strength 4/5 2/23: see note    Time 4    Period Weeks    Status On-going    Target Date 08/03/20             PT Long Term Goals - 09/08/20 1541      PT LONG TERM GOAL #1   Title Patient will increase FOTO score to equal to or greater than 67 to demonstrate statistically significant improvement in mobility and quality of life.    Baseline 12/21: 57 2/23: 74%    Time 8    Period Weeks    Status Achieved     Target Date 10/26/20      PT LONG TERM GOAL #2   Title Patient (> 44 years old) will complete five times sit to stand test in < 15 seconds indicating an increased LE strength and improved balance.    Baseline 12/21: 17.65s, 01/26: 14.47s    Time 8    Period Weeks    Status Achieved      PT LONG TERM GOAL #3   Title Patient will increase six minute walk test distance to >1000 for progression to community ambulator and improve gait ability    Baseline 12/21: 500, 01/26: 655 ft 2/23: 735 ft    Time 8    Period Weeks    Status Partially Met    Target Date 10/26/20      PT LONG TERM GOAL #4   Title Patient will reduce timed up and go to <11 seconds to reduce fall risk and demonstrate improved transfer/gait ability.    Baseline 12/21: 16.78s, 01/26: 12.08s 2/23: 10 sec    Time 8    Period Weeks    Status Achieved      PT LONG TERM GOAL #5   Title Patient will increase lower extremity functional scale to >60/80 to demonstrate improved functional mobility and increased tolerance with ADLs.    Baseline 12/21: 50/80, 01/26: 62/80    Time 8    Period Weeks    Status Achieved                 Plan - 09/22/20 1330    Clinical Impression Statement Patient tolerated standing and seated interventions with no episodes of pain or exacerbation. He remains highly motivated and eager to participate in strengthening and stability interventions. Patient's condition has the potential to improve in response to therapy. Maximum improvement is yet to be obtained. The anticipated improvement is attainable and reasonable in a generally predictable time.  Pt will benefit from further skilled therapy to increase flexibility, B LE strength and balance to improve QOL and ease with all functional activities.    Personal Factors and Comorbidities Comorbidity 3+;Time since onset of injury/illness/exacerbation    Comorbidities diabetes, hypertension, hyperlipidemia    Examination-Activity Limitations  Squat;Stairs;Transfers    Examination-Participation Restrictions [employer]    Stability/Clinical Decision Making Evolving/Moderate complexity    Rehab Potential  Fair    PT Frequency 2x / week    PT Duration 8 weeks    PT Treatment/Interventions Moist Heat;Electrical Stimulation;Gait training;Stair training;Functional mobility training;Therapeutic activities;Therapeutic exercise;Balance training;Neuromuscular re-education;Patient/family education;Manual techniques;Passive range of motion;Energy conservation    PT Next Visit Plan Hip extension    PT Home Exercise Plan Hamstring stretch, piriformis stretch    Consulted and Agree with Plan of Care Patient           Patient will benefit from skilled therapeutic intervention in order to improve the following deficits and impairments:  Abnormal gait,Decreased endurance,Impaired sensation,Decreased activity tolerance,Decreased strength,Decreased balance,Decreased mobility,Difficulty walking,Decreased range of motion,Decreased safety awareness,Improper body mechanics  Visit Diagnosis: Muscle weakness (generalized)  Gait abnormality  Pain in right hip  Other abnormalities of gait and mobility     Problem List There are no problems to display for this patient.  Janna Arch, PT, DPT   09/22/2020, 1:44 PM  [place] MAIN [facility] SERVICES [address] [place], [place], [address] Phone: [phone]   Fax:  [phone]  Name: Michael Cohen MRN: [id number] Date of Birth: Dec 01, 1940

## 2020-09-27 ENCOUNTER — Other Ambulatory Visit: Payer: Self-pay

## 2020-09-27 ENCOUNTER — Ambulatory Visit: Payer: Medicare Other

## 2020-09-27 DIAGNOSIS — M6281 Muscle weakness (generalized): Secondary | ICD-10-CM | POA: Diagnosis not present

## 2020-09-27 DIAGNOSIS — M25551 Pain in right hip: Secondary | ICD-10-CM

## 2020-09-27 DIAGNOSIS — R269 Unspecified abnormalities of gait and mobility: Secondary | ICD-10-CM

## 2020-09-27 NOTE — Therapy (Signed)
[place] MAIN [facility] SERVICES [address] [place], [place], [address] Phone: [phone]   Fax:  [phone]  Physical Therapy Treatment  Patient Details  Name: Michael Cohen MRN: [id number] Date of Birth: 01-20-1941 Referring Provider (PT): [name]   Encounter Date: 09/27/2020   PT End of Session - 09/27/20 1436    Visit Number 21    Number of Visits 25    Date for PT Re-Evaluation 10/26/20    Authorization Type 1/10 PN 3/9    PT Start Time 1430    PT Stop Time 1514    PT Time Calculation (min) 44 min    Equipment Utilized During Treatment Gait belt    Activity Tolerance Patient tolerated treatment well    Behavior During Therapy [facility] for tasks assessed/performed           Past Medical History:  Diagnosis Date  . Diabetes mellitus without complication ([place])   . Hypercholesteremia   . Hypertension     Past Surgical History:  Procedure Laterality Date  . CATARACT EXTRACTION Bilateral   . COLONOSCOPY WITH PROPOFOL N/A 12/12/2017   Procedure: COLONOSCOPY WITH PROPOFOL;  Surgeon: [name], [name], MD;  Location: [facility] ENDOSCOPY;  Service: Gastroenterology;  Laterality: N/A;  . EYE SURGERY      There were no vitals filed for this visit.   Subjective Assessment - 09/27/20 1435    Subjective Patient reports no falls or LOB since last session. Has been busy over the weekend.    Pertinent History 80 y.o. 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 His pain began around a month ago and is located over the proximal lateral thigh and in the right buttocks. He describes his pain as achy more than sharp. He is not sure of any aggravating activities. He reports of having some pain at rest today. He reports associated pain or "tightness" felt along anterior and lateral portions of the lower leg. He was previously evaluated by [name] in podiatry and diagnosed with tibialis  tendinitis. He denies associated groin pain, denies radicular symptoms. He has tried Baclofen without any relief of his symptoms. Of note, the patient states that he was scheduled to go on a cruise this week but was canceled due to the pain he is experiencing. He states that he would be unable to walk around and participate fully in his vacation given his pain. An anteroposterior view of the pelvis and anteroposterior and lateral views of the right hip were obtained. Images reveal mild loss of femoral acetabular joint space with mild osteophyte formation noted. CAM deformity of the right hip noted. No fractures or dislocations. Anteroposterior, oblique, and lateral views of the lumbar spine were obtained. Images reveal no scoliosis. Mild loss of lumbar lordosis noted. Significant loss of disc space noted at L4-L5 and L5-S1. Intervertebral osteophyte formation of L4 and L5. No fractures or wedging noted. Calcification of the abdominal aorta noted. Degeneration of the lumbar facets noted. Patient was recently diagnosed with lumbar radiculoapthy due to symptoms being more consistent vs hip pathology given the absence of hip symptoms with provocative maneuvers. Prednisone tapers have been considered but is on hold for now due to patient's diabetes.    Limitations Walking;Standing;Lifting;Sitting    How long can you sit comfortably? Few hours    How long can you stand comfortably? 30 mins    How long can you walk comfortably? 1 hour  Currently in Pain? No/denies                 Nu Step x L3 x 4 mins; for cardiovascular strength and musculoskeletal challenge ; seat position 10, arms at 9, RPM> 60     Next to support surface: airex pad: hurdle airex sandwhich: lateral stepping 10x each LE, BUE support airex pad: hurdle airex sandwhich:; forward stepping, SUE support 10x each LE.  airex pad: 3 cone taps SUE support, 8x each LE airex balance beam: lateral stepping 6x length airex balance beam:  tandem stance 6x length of // bars.   Standing heel toe raise 15x  Standing with # 3lb ankle weight: CGA for stability   -Hip extension with bilateral upper extremity support, cueing for neutral hip alignment, upright posture for optimal muscle recruitment, and sequencing, 10x each LE,  -Hip abduction with bilateral upper extremity support, cueing for neutral foot alignment for correct muscle activation, 4x length of // bars -marches with bilateral upper extremity support, cueing for body mechanics, speed of muscle recruitment for optimal strengthening and stabilization 4x length of // bars -Hamstring curl with bilateral upper extremity support, cueing for knee alignment for recruitment of hamstring musculature, 10x each LE       Pt educated throughout session about proper posture and technique with exercises. Improved exercise technique, movement at target joints, use of target muscles after min to mod verbal, visual, tactile cues                   PT Education - 09/27/20 1435    Education Details exercise technique, body mechanics    Person(s) Educated Patient    Methods Explanation;Demonstration;Tactile cues;Verbal cues    Comprehension Verbalized understanding;Returned demonstration;Verbal cues required;Tactile cues required            PT Short Term Goals - 09/08/20 1541      PT SHORT TERM GOAL #1   Title Patient will be independent in home exercise program to improve strength/mobility for better functional independence with ADLs.    Baseline patient is compliant with all his HEP; attending water aerobics on days he's not coming to therapy.    Time 4    Period Weeks    Status Achieved    Target Date 08/03/20      PT SHORT TERM GOAL #2   Title Patient will increase RLE gross strength to 4+/5 as to improve functional strength for independent gait, increased standing tolerance and increased ADL ability.    Baseline 12/21: RLE gross strength 4/5 2/23: see note     Time 4    Period Weeks    Status On-going    Target Date 08/03/20             PT Long Term Goals - 09/08/20 1541      PT LONG TERM GOAL #1   Title Patient will increase FOTO score to equal to or greater than 67 to demonstrate statistically significant improvement in mobility and quality of life.    Baseline 12/21: 57 2/23: 74%    Time 8    Period Weeks    Status Achieved    Target Date 10/26/20      PT LONG TERM GOAL #2   Title Patient (> 27 years old) will complete five times sit to stand test in < 15 seconds indicating an increased LE strength and improved balance.    Baseline 12/21: 17.65s, 01/26: 14.47s    Time 8  Period Weeks    Status Achieved      PT LONG TERM GOAL #3   Title Patient will increase six minute walk test distance to >1000 for progression to community ambulator and improve gait ability    Baseline 12/21: 500, 01/26: 655 ft 2/23: 735 ft    Time 8    Period Weeks    Status Partially Met    Target Date 10/26/20      PT LONG TERM GOAL #4   Title Patient will reduce timed up and go to <11 seconds to reduce fall risk and demonstrate improved transfer/gait ability.    Baseline 12/21: 16.78s, 01/26: 12.08s 2/23: 10 sec    Time 8    Period Weeks    Status Achieved      PT LONG TERM GOAL #5   Title Patient will increase lower extremity functional scale to >60/80 to demonstrate improved functional mobility and increased tolerance with ADLs.    Baseline 12/21: 50/80, 01/26: 62/80    Time 8    Period Weeks    Status Achieved                 Plan - 09/27/20 1651    Clinical Impression Statement Patient tolerates progressions of stability and strengthening interventions without LOB. He remains highly motivated throughout physical therapy session. Single limb interventions are challenging for patient to perform but is improving compared to previous sessions. Pt requiring min VC's for quality of movement for targeted muscles with good to excellent  carryover after cuing. Pt can continue to benefit from further skilled PT treatment to improve functional mobiltiy and target goals    Personal Factors and Comorbidities Comorbidity 3+;Time since onset of injury/illness/exacerbation    Comorbidities diabetes, hypertension, hyperlipidemia    Examination-Activity Limitations Squat;Stairs;Transfers    Examination-Participation Restrictions [place]    Stability/Clinical Decision Making Evolving/Moderate complexity    Rehab Potential Fair    PT Frequency 2x / week    PT Duration 8 weeks    PT Treatment/Interventions Moist Heat;Electrical Stimulation;Gait training;Stair training;Functional mobility training;Therapeutic activities;Therapeutic exercise;Balance training;Neuromuscular re-education;Patient/family education;Manual techniques;Passive range of motion;Energy conservation    PT Next Visit Plan Hip extension    PT Home Exercise Plan Hamstring stretch, piriformis stretch    Consulted and Agree with Plan of Care Patient           Patient will benefit from skilled therapeutic intervention in order to improve the following deficits and impairments:  Abnormal gait,Decreased endurance,Impaired sensation,Decreased activity tolerance,Decreased strength,Decreased balance,Decreased mobility,Difficulty walking,Decreased range of motion,Decreased safety awareness,Improper body mechanics  Visit Diagnosis: Muscle weakness (generalized)  Gait abnormality  Pain in right hip     Problem List There are no problems to display for this patient.  Janna Arch, PT, DPT   09/27/2020, 4:52 PM  [place] MAIN [facility] SERVICES [address] [place], [place], [address] Phone: [phone]   Fax:  [phone]  Name: Michael Cohen MRN: [id number] Date of Birth: 1940/11/03

## 2020-09-29 ENCOUNTER — Other Ambulatory Visit: Payer: Self-pay

## 2020-09-29 ENCOUNTER — Ambulatory Visit: Payer: Medicare Other

## 2020-09-29 DIAGNOSIS — R2689 Other abnormalities of gait and mobility: Secondary | ICD-10-CM

## 2020-09-29 DIAGNOSIS — R269 Unspecified abnormalities of gait and mobility: Secondary | ICD-10-CM

## 2020-09-29 DIAGNOSIS — M25551 Pain in right hip: Secondary | ICD-10-CM

## 2020-09-29 DIAGNOSIS — M6281 Muscle weakness (generalized): Secondary | ICD-10-CM | POA: Diagnosis not present

## 2020-09-29 NOTE — Therapy (Signed)
[place] MAIN [facility] SERVICES [address] [place], [place], [address] Phone: [phone]   Fax:  [phone]  Physical Therapy Treatment  Patient Details  Name: Michael Cohen MRN: [id number] Date of Birth: 1940/09/24 Referring Provider (PT): [name]   Encounter Date: 09/29/2020   PT End of Session - 09/29/20 1505    Visit Number 22    Number of Visits 25    Date for PT Re-Evaluation 10/26/20    Authorization Time Period Cert 2/77/82-11/07/51    PT Start Time 1431    PT Stop Time 1510    PT Time Calculation (min) 39 min    Equipment Utilized During Treatment Gait belt    Activity Tolerance Patient tolerated treatment well;No increased pain    Behavior During Therapy WFL for tasks assessed/performed           Past Medical History:  Diagnosis Date  . Diabetes mellitus without complication ([place])   . Hypercholesteremia   . Hypertension     Past Surgical History:  Procedure Laterality Date  . CATARACT EXTRACTION Bilateral   . COLONOSCOPY WITH PROPOFOL N/A 12/12/2017   Procedure: COLONOSCOPY WITH PROPOFOL;  Surgeon: [name], [name], MD;  Location: [facility] ENDOSCOPY;  Service: Gastroenterology;  Laterality: N/A;  . EYE SURGERY      There were no vitals filed for this visit.   Subjective Assessment - 09/29/20 1445    Subjective Pt doing well in general. Reports still taking tylenol prophylacticly 2 each morning, but has not had pain in a while. He still waks the dog regularly which helps him be active, lose weight. He reports stiffness in back after rpeeated lifting of Daisy the dog yesterday at [facility].    Pertinent History 80 y.o. 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 His pain began around a month ago and is located over the proximal lateral thigh and in the right buttocks. He describes his pain as achy more than sharp. He is not sure of any aggravating  activities. He reports of having some pain at rest today. He reports associated pain or "tightness" felt along anterior and lateral portions of the lower leg. He was previously evaluated by [name] in podiatry and diagnosed with tibialis tendinitis. He denies associated groin pain, denies radicular symptoms. He has tried Baclofen without any relief of his symptoms. Of note, the patient states that he was scheduled to go on a cruise this week but was canceled due to the pain he is experiencing. He states that he would be unable to walk around and participate fully in his vacation given his pain. An anteroposterior view of the pelvis and anteroposterior and lateral views of the right hip were obtained. Images reveal mild loss of femoral acetabular joint space with mild osteophyte formation noted. CAM deformity of the right hip noted. No fractures or dislocations. Anteroposterior, oblique, and lateral views of the lumbar spine were obtained. Images reveal no scoliosis. Mild loss of lumbar lordosis noted. Significant loss of disc space noted at L4-L5 and L5-S1. Intervertebral osteophyte formation of L4 and L5. No fractures or wedging noted. Calcification of the abdominal aorta noted. Degeneration of the lumbar facets noted. Patient was recently diagnosed with lumbar radiculoapthy due to symptoms being more consistent vs hip pathology given the absence of hip symptoms with provocative maneuvers. Prednisone tapers have been considered but is on hold for now due to patient's diabetes.  Currently in Pain? No/denies   Back stiffness only           INTERVENTION THIS DATE:  *MMT -Hip flexion 5/5 -seated hip extension 5/5 bilat -seated horizontal hip ABDCT: 5/5 bilat (slight weakness on Right)  -Seated hip rotation: all 5/5 except Rt ER: 4+/5 -Knee extension: 5/5 bilat -Knee flexion: Left: 5/5; Rt 4+/5  -Ankles: deferred to next session  5xSTS: 13.1sec 6MWT: 672ft, no device asymmetrical trunk sway,  increased Right toe-out ~35 degrees.   -Airex standing cone taps: 2x20 -6" fwd stepups (airex surface to airex surface), single to B UE support; appears to have delayed force generation due to weakness.     PT Short Term Goals - 09/08/20 1541      PT SHORT TERM GOAL #1   Title Patient will be independent in home exercise program to improve strength/mobility for better functional independence with ADLs.    Baseline patient is compliant with all his HEP; attending water aerobics on days he's not coming to therapy.    Time 4    Period Weeks    Status Achieved    Target Date 08/03/20      PT SHORT TERM GOAL #2   Title Patient will increase RLE gross strength to 4+/5 as to improve functional strength for independent gait, increased standing tolerance and increased ADL ability.    Baseline 12/21: RLE gross strength 4/5 2/23: see note    Time 4    Period Weeks    Status On-going    Target Date 08/03/20             PT Long Term Goals - 09/29/20 1449      PT LONG TERM GOAL #1   Title Patient will increase FOTO score to equal to or greater than 67 to demonstrate statistically significant improvement in mobility and quality of life.    Baseline 12/21: 57 2/23: 74%    Time 8    Period Weeks    Status Achieved      PT LONG TERM GOAL #2   Title Patient (> 67 years old) will complete five times sit to stand test in <12 seconds indicating an increased LE strength and improved balance.    Baseline 12/21: 17.65s, 01/26: 14.47s; 3/16: 13.17sec    Time 8    Period Weeks    Status Revised    Target Date 10/26/20      PT LONG TERM GOAL #3   Title Patient will increase six minute walk test distance to >1000 for progression to community ambulator and improve gait ability    Baseline 12/21: 500, 01/26: 655 ft 2/23: 735 ft; 3/16: 63ft    Time 8    Period Weeks    Status On-going    Target Date 10/26/20      PT LONG TERM GOAL #4   Title Patient will reduce timed up and go to <11 seconds  to reduce fall risk and demonstrate improved transfer/gait ability.    Baseline 12/21: 16.78s, 01/26: 12.08s 2/23: 10 sec    Time 8    Period Weeks    Status Achieved    Target Date 08/31/20      PT LONG TERM GOAL #5   Title Patient will increase lower extremity functional scale to >60/80 to demonstrate improved functional mobility and increased tolerance with ADLs.    Baseline 12/21: 50/80, 01/26: 62/80    Time 8    Period Weeks    Status Achieved  Target Date 08/31/20                 Plan - 09/29/20 1508    Clinical Impression Statement Reviewed performance measures, continues to make progress in 5xSTS, but 6MWT is in short plateau since prior assessment; weakness remains in fwd step ups and STS. Pt has not more pain in his Rt hip. Discussed goals and remaining deficits for next few weeks of therapy. Pt continues to show good capacity for development.    Personal Factors and Comorbidities Comorbidity 3+;Time since onset of injury/illness/exacerbation    Comorbidities diabetes, hypertension, hyperlipidemia    Examination-Activity Limitations Squat;Stairs;Transfers    Examination-Participation Restrictions [place]    Stability/Clinical Decision Making Evolving/Moderate complexity    Clinical Decision Making Moderate    Rehab Potential Fair    PT Frequency 1x / week    PT Duration 8 weeks    PT Treatment/Interventions Moist Heat;Electrical Stimulation;Gait training;Stair training;Functional mobility training;Therapeutic activities;Therapeutic exercise;Balance training;Neuromuscular re-education;Patient/family education;Manual techniques;Passive range of motion;Energy conservation    PT Next Visit Plan Progress functional hip/knee extension strength (for forward stepping/ STS strength)    PT Home Exercise Plan no updates    Consulted and Agree with Plan of Care Patient           Patient will benefit from skilled therapeutic intervention in order to improve the following  deficits and impairments:  Abnormal gait,Decreased endurance,Impaired sensation,Decreased activity tolerance,Decreased strength,Decreased balance,Decreased mobility,Difficulty walking,Decreased range of motion,Decreased safety awareness,Improper body mechanics  Visit Diagnosis: Muscle weakness (generalized)  Gait abnormality  Pain in right hip  Other abnormalities of gait and mobility     Problem List There are no problems to display for this patient.  3:24 PM, 09/29/20 Etta Grandchild, PT, DPT Physical Therapist - [facility]  [phone] ([address])    Marlin C 09/29/2020, 3:17 PM  [place] MAIN [facility] SERVICES [address] [place], [place], [address] Phone: [phone]   Fax:  [phone]  Name: Michael Cohen MRN: [id number] Date of Birth: July 04, 1941

## 2020-10-04 ENCOUNTER — Ambulatory Visit: Payer: Medicare Other

## 2020-10-06 ENCOUNTER — Ambulatory Visit: Payer: Medicare Other

## 2020-10-06 ENCOUNTER — Other Ambulatory Visit: Payer: Self-pay

## 2020-10-06 DIAGNOSIS — R269 Unspecified abnormalities of gait and mobility: Secondary | ICD-10-CM

## 2020-10-06 DIAGNOSIS — M6281 Muscle weakness (generalized): Secondary | ICD-10-CM

## 2020-10-06 DIAGNOSIS — M25551 Pain in right hip: Secondary | ICD-10-CM

## 2020-10-06 DIAGNOSIS — R2689 Other abnormalities of gait and mobility: Secondary | ICD-10-CM

## 2020-10-06 NOTE — Therapy (Signed)
[place] MAIN [facility] SERVICES [address] [place], [place], [address] Phone: [phone]   Fax:  [phone]  Physical Therapy Treatment  Patient Details  Name: Michael Cohen MRN: [id number] Date of Birth: Nov 22, 1940 Referring Provider (PT): [name]   Encounter Date: 10/06/2020   PT End of Session - 10/06/20 1541    Visit Number 23    Number of Visits 25    Date for PT Re-Evaluation 10/26/20    Authorization Type Medicare Parts A & B    Authorization Time Period Cert 6/83/41-9/62/22    PT Start Time 1302    PT Stop Time 1342    PT Time Calculation (min) 40 min    Equipment Utilized During Treatment Gait belt    Activity Tolerance Patient tolerated treatment well;No increased pain    Behavior During Therapy WFL for tasks assessed/performed           Past Medical History:  Diagnosis Date  . Diabetes mellitus without complication ([place])   . Hypercholesteremia   . Hypertension     Past Surgical History:  Procedure Laterality Date  . CATARACT EXTRACTION Bilateral   . COLONOSCOPY WITH PROPOFOL N/A 12/12/2017   Procedure: COLONOSCOPY WITH PROPOFOL;  Surgeon: [name], [name], MD;  Location: [facility] ENDOSCOPY;  Service: Gastroenterology;  Laterality: N/A;  . EYE SURGERY      There were no vitals filed for this visit.   Subjective Assessment - 10/06/20 1539    Subjective Doing well today. Reports another Tuesday aggravation of Right hip pain s/p taking his dog to the therapy session at [facility]. The repeated bending and lift of dog has resulted in some symptoms.    Pertinent History 80 y.o. 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 His pain began around a month ago and is located over the proximal lateral thigh and in the right buttocks. He describes his pain as achy more than sharp. He is not sure of any aggravating activities. He reports of having some pain at rest  today. He reports associated pain or "tightness" felt along anterior and lateral portions of the lower leg. He was previously evaluated by [name] in podiatry and diagnosed with tibialis tendinitis. He denies associated groin pain, denies radicular symptoms. He has tried Baclofen without any relief of his symptoms. Of note, the patient states that he was scheduled to go on a cruise this week but was canceled due to the pain he is experiencing. He states that he would be unable to walk around and participate fully in his vacation given his pain. An anteroposterior view of the pelvis and anteroposterior and lateral views of the right hip were obtained. Images reveal mild loss of femoral acetabular joint space with mild osteophyte formation noted. CAM deformity of the right hip noted. No fractures or dislocations. Anteroposterior, oblique, and lateral views of the lumbar spine were obtained. Images reveal no scoliosis. Mild loss of lumbar lordosis noted. Significant loss of disc space noted at L4-L5 and L5-S1. Intervertebral osteophyte formation of L4 and L5. No fractures or wedging noted. Calcification of the abdominal aorta noted. Degeneration of the lumbar facets noted. Patient was recently diagnosed with lumbar radiculoapthy due to symptoms being more consistent vs hip pathology given the absence of hip symptoms with provocative maneuvers. Prednisone tapers have been considered but is on hold for now due to patient's diabetes.    Currently in Pain? Yes  Pain Score 2     Pain Location --   right posterolateral pelvis         INTERVENTION THIS DATE: -Myofascial release to Right posterolateral gluteus medius x5 minutes; subsequent reduction in pain, symptoms in right pelvis for rest of session -four square stepping perimeter of red swampy mat, 6 laps, minguard assist (labored, but without LOB)  -372ft AMB (2 laps) c 2lb AW bilat and dragging 10lb satchel from gaitbelt (minGuard asssit)  clockwise -Lateral side stepping 1x52ft bilat c greenTB at knees  (struggles with pure frontal plane movement, heavy compensatory patterns to use other muscles (mostly hip flexors/TFL)) -160ft AMB (1 lap) CCW c 2lb AW and 20lb satchel drag from gait belt -Lateral side stepping 1x56ft bilat with 2lb AW (struggles with pure frontal plane movement, heavy compensatory patterns to use other muscles (mostly hip flexors/TFL))   PT Short Term Goals - 09/08/20 1541      PT SHORT TERM GOAL #1   Title Patient will be independent in home exercise program to improve strength/mobility for better functional independence with ADLs.    Baseline patient is compliant with all his HEP; attending water aerobics on days he's not coming to therapy.    Time 4    Period Weeks    Status Achieved    Target Date 08/03/20      PT SHORT TERM GOAL #2   Title Patient will increase RLE gross strength to 4+/5 as to improve functional strength for independent gait, increased standing tolerance and increased ADL ability.    Baseline 12/21: RLE gross strength 4/5 2/23: see note    Time 4    Period Weeks    Status On-going    Target Date 08/03/20             PT Long Term Goals - 09/29/20 1449      PT LONG TERM GOAL #1   Title Patient will increase FOTO score to equal to or greater than 67 to demonstrate statistically significant improvement in mobility and quality of life.    Baseline 12/21: 57 2/23: 74%    Time 8    Period Weeks    Status Achieved      PT LONG TERM GOAL #2   Title Patient (> 25 years old) will complete five times sit to stand test in <12 seconds indicating an increased LE strength and improved balance.    Baseline 12/21: 17.65s, 01/26: 14.47s; 3/16: 13.17sec    Time 8    Period Weeks    Status Revised    Target Date 10/26/20      PT LONG TERM GOAL #3   Title Patient will increase six minute walk test distance to >1000 for progression to community ambulator and improve gait ability     Baseline 12/21: 500, 01/26: 655 ft 2/23: 735 ft; 3/16: 668ft    Time 8    Period Weeks    Status On-going    Target Date 10/26/20      PT LONG TERM GOAL #4   Title Patient will reduce timed up and go to <11 seconds to reduce fall risk and demonstrate improved transfer/gait ability.    Baseline 12/21: 16.78s, 01/26: 12.08s 2/23: 10 sec    Time 8    Period Weeks    Status Achieved    Target Date 08/31/20      PT LONG TERM GOAL #5   Title Patient will increase lower extremity functional scale to >60/80 to demonstrate improved  functional mobility and increased tolerance with ADLs.    Baseline 12/21: 50/80, 01/26: 62/80    Time 8    Period Weeks    Status Achieved    Target Date 08/31/20                 Plan - 10/06/20 1542    Clinical Impression Statement Pt arrives with increased mild Right gluteal conniption 2/2 frequent dog lifting, good resolution with manual therapies, but gives good testament for remaining strength deficits. Advanced gait-based balance training, and began some resisted walking to improve overall power output with AMB. Focal gluteal strength at EOS. Did not have time to progress incline AMB intervals on treadmill for time. Pt continues to progress well in general toward final goals. Would benefit from trial of dumbbell RDL next session (depth limited to tolerance) to work on resisted hip extension and gluteal engagement.    Personal Factors and Comorbidities Comorbidity 3+;Time since onset of injury/illness/exacerbation    Comorbidities diabetes, hypertension, hyperlipidemia    Examination-Activity Limitations Squat;Stairs;Transfers    Examination-Participation Restrictions Church    Stability/Clinical Decision Making Evolving/Moderate complexity    Clinical Decision Making Moderate    Rehab Potential Fair    PT Frequency 1x / week    PT Duration 2 weeks    PT Treatment/Interventions Moist Heat;Electrical Stimulation;Gait training;Stair training;Functional  mobility training;Therapeutic activities;Therapeutic exercise;Balance training;Neuromuscular re-education;Patient/family education;Manual techniques;Passive range of motion;Energy conservation    PT Next Visit Plan Advance multiplanar, multi surface balance training; Right hip abduction, ER strength;    PT Home Exercise Plan no updates    Consulted and Agree with Plan of Care Patient           Patient will benefit from skilled therapeutic intervention in order to improve the following deficits and impairments:  Abnormal gait,Decreased endurance,Impaired sensation,Decreased activity tolerance,Decreased strength,Decreased balance,Decreased mobility,Difficulty walking,Decreased range of motion,Decreased safety awareness,Improper body mechanics  Visit Diagnosis: Muscle weakness (generalized)  Gait abnormality  Pain in right hip  Other abnormalities of gait and mobility     Problem List There are no problems to display for this patient.  3:53 PM, 10/06/20 Etta Grandchild, PT, DPT Physical Therapist - [place] [phone]     Etta Grandchild 10/06/2020, 3:44 PM  [place] MAIN [facility] SERVICES [address] [place], [place], [address] Phone: [phone]   Fax:  [phone]  Name: Michael Cohen MRN: [id number] Date of Birth: 11-04-1940

## 2020-10-11 ENCOUNTER — Ambulatory Visit: Payer: Medicare Other

## 2020-10-11 ENCOUNTER — Other Ambulatory Visit: Payer: Self-pay

## 2020-10-11 DIAGNOSIS — R2689 Other abnormalities of gait and mobility: Secondary | ICD-10-CM

## 2020-10-11 DIAGNOSIS — M6281 Muscle weakness (generalized): Secondary | ICD-10-CM

## 2020-10-11 DIAGNOSIS — R2681 Unsteadiness on feet: Secondary | ICD-10-CM

## 2020-10-11 NOTE — Therapy (Signed)
[place] MAIN [facility] SERVICES [address] [place], [place], [address] Phone: [phone]   Fax:  [phone]  Physical Therapy Treatment  Patient Details  Name: Michael Cohen MRN: [id number] Date of Birth: 03-31-1941 Referring Provider (PT): [name]   Encounter Date: 10/11/2020   PT End of Session - 10/11/20 1311    Visit Number 24    Number of Visits 25    Date for PT Re-Evaluation 10/26/20    Authorization Type Medicare Parts A & B    Authorization Time Period Cert 2/70/62-3/76/28    PT Start Time 1301    PT Stop Time 1344    PT Time Calculation (min) 43 min    Equipment Utilized During Treatment Gait belt    Activity Tolerance Patient tolerated treatment well;No increased pain    Behavior During Therapy WFL for tasks assessed/performed           Past Medical History:  Diagnosis Date  . Diabetes mellitus without complication ([place])   . Hypercholesteremia   . Hypertension     Past Surgical History:  Procedure Laterality Date  . CATARACT EXTRACTION Bilateral   . COLONOSCOPY WITH PROPOFOL N/A 12/12/2017   Procedure: COLONOSCOPY WITH PROPOFOL;  Surgeon: [name], [name], MD;  Location: [facility] ENDOSCOPY;  Service: Gastroenterology;  Laterality: N/A;  . EYE SURGERY      There were no vitals filed for this visit.   Subjective Assessment - 10/11/20 1307    Subjective Pt reports no pain today. No other changes reported.    Pertinent History 80 y.o. 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 His pain began around a month ago and is located over the proximal lateral thigh and in the right buttocks. He describes his pain as achy more than sharp. He is not sure of any aggravating activities. He reports of having some pain at rest today. He reports associated pain or "tightness" felt along anterior and lateral portions of the lower leg. He was previously evaluated by Dr.  [name] in podiatry and diagnosed with tibialis tendinitis. He denies associated groin pain, denies radicular symptoms. He has tried Baclofen without any relief of his symptoms. Of note, the patient states that he was scheduled to go on a cruise this week but was canceled due to the pain he is experiencing. He states that he would be unable to walk around and participate fully in his vacation given his pain. An anteroposterior view of the pelvis and anteroposterior and lateral views of the right hip were obtained. Images reveal mild loss of femoral acetabular joint space with mild osteophyte formation noted. CAM deformity of the right hip noted. No fractures or dislocations. Anteroposterior, oblique, and lateral views of the lumbar spine were obtained. Images reveal no scoliosis. Mild loss of lumbar lordosis noted. Significant loss of disc space noted at L4-L5 and L5-S1. Intervertebral osteophyte formation of L4 and L5. No fractures or wedging noted. Calcification of the abdominal aorta noted. Degeneration of the lumbar facets noted. Patient was recently diagnosed with lumbar radiculoapthy due to symptoms being more consistent vs hip pathology given the absence of hip symptoms with provocative maneuvers. Prednisone tapers have been considered but is on hold for now due to patient's diabetes.    Currently in Pain? No/denies             TREATMENT   Next to support surface:  Neuro:  Airex pad forward/backward stepping,  one arm support on bar, CGA - x multiple reps, VC for technique  Airex pad side stepping, one arm support on bar, CGA - x multiple reps, VC for technique   airex pad: 3 cone taps SUE support, 8x each LE, CGA  Standing on airex pad ball toss to front and side to side - x several minutes, CGA  Star excursion 3x each LE, CGA; VC for technique; pt with multiple instances of postural intsabliity  Obstacle course with stepping over hurdles, CGA - 6x two instance of LOB with R LE trying  to clear hurdle, pt takes step to regain balance  Therex:  Nu Step x L3 x 4 mins; for cardiovascular strength and musculoskeletal challenge ; seat position 10, arms at 9, VC for RPM> 60  STS with arms out in front,  2x6   Pt educated throughout session about proper posture and technique with exercises. Improved exercise technique, movement at target joints, use of target muscles after min to mod verbal, visual, tactile cues   Assessment: Pt has difficulty with maintaining balance and sustaining SLS on R LE compared to L LE with star excursion exercise. Pt also had two instances of LOB with orange hurdle exercise but was able to regain balance with step and CGA from therapist. Moreover, pt requires UUE support for majority of balance exercises this session. The pt will benefit from further skilled PT to improve BLE strength and static and dynamic balance to increase safety with all ADLs.     PT Education - 10/11/20 1309    Education Details exercise technique, body mechanics    Person(s) Educated Patient    Methods Explanation            PT Short Term Goals - 09/08/20 1541      PT SHORT TERM GOAL #1   Title Patient will be independent in home exercise program to improve strength/mobility for better functional independence with ADLs.    Baseline patient is compliant with all his HEP; attending water aerobics on days he's not coming to therapy.    Time 4    Period Weeks    Status Achieved    Target Date 08/03/20      PT SHORT TERM GOAL #2   Title Patient will increase RLE gross strength to 4+/5 as to improve functional strength for independent gait, increased standing tolerance and increased ADL ability.    Baseline 12/21: RLE gross strength 4/5 2/23: see note    Time 4    Period Weeks    Status On-going    Target Date 08/03/20             PT Long Term Goals - 09/29/20 1449      PT LONG TERM GOAL #1   Title Patient will increase FOTO score to equal to or greater than 67  to demonstrate statistically significant improvement in mobility and quality of life.    Baseline 12/21: 57 2/23: 74%    Time 8    Period Weeks    Status Achieved      PT LONG TERM GOAL #2   Title Patient (> 49 years old) will complete five times sit to stand test in <12 seconds indicating an increased LE strength and improved balance.    Baseline 12/21: 17.65s, 01/26: 14.47s; 3/16: 13.17sec    Time 8    Period Weeks    Status Revised    Target Date 10/26/20      PT LONG TERM GOAL #  3   Title Patient will increase six minute walk test distance to >1000 for progression to community ambulator and improve gait ability    Baseline 12/21: 500, 01/26: 655 ft 2/23: 735 ft; 3/16: 649ft    Time 8    Period Weeks    Status On-going    Target Date 10/26/20      PT LONG TERM GOAL #4   Title Patient will reduce timed up and go to <11 seconds to reduce fall risk and demonstrate improved transfer/gait ability.    Baseline 12/21: 16.78s, 01/26: 12.08s 2/23: 10 sec    Time 8    Period Weeks    Status Achieved    Target Date 08/31/20      PT LONG TERM GOAL #5   Title Patient will increase lower extremity functional scale to >60/80 to demonstrate improved functional mobility and increased tolerance with ADLs.    Baseline 12/21: 50/80, 01/26: 62/80    Time 8    Period Weeks    Status Achieved    Target Date 08/31/20                 Plan - 10/11/20 1658    Clinical Impression Statement Pt has difficulty with maintaining balance and sustaining SLS on R LE compared to L LE with star excursion exercise. Pt also had two instances of LOB with orange hurdle exercise but was able to regain balance with step and CGA from therapist. Moreover, pt requires UUE support for majority of balance exercises this session. The pt will benefit from further skilled PT to improve BLE strength and static and dynamic balance to increase safety with all ADLs.    Personal Factors and Comorbidities Comorbidity  3+;Time since onset of injury/illness/exacerbation    Comorbidities diabetes, hypertension, hyperlipidemia    Examination-Activity Limitations Squat;Stairs;Transfers    Examination-Participation Restrictions Church    Stability/Clinical Decision Making Evolving/Moderate complexity    Rehab Potential Fair    PT Frequency 1x / week    PT Duration 2 weeks    PT Treatment/Interventions Moist Heat;Electrical Stimulation;Gait training;Stair training;Functional mobility training;Therapeutic activities;Therapeutic exercise;Balance training;Neuromuscular re-education;Patient/family education;Manual techniques;Passive range of motion;Energy conservation    PT Next Visit Plan Advance multiplanar, multi surface balance training; Right hip abduction, ER strength;    PT Home Exercise Plan no updates    Consulted and Agree with Plan of Care Patient           Patient will benefit from skilled therapeutic intervention in order to improve the following deficits and impairments:  Abnormal gait,Decreased endurance,Impaired sensation,Decreased activity tolerance,Decreased strength,Decreased balance,Decreased mobility,Difficulty walking,Decreased range of motion,Decreased safety awareness,Improper body mechanics  Visit Diagnosis: Unsteadiness on feet  Muscle weakness (generalized)  Other abnormalities of gait and mobility     Problem List There are no problems to display for this patient.  Ricard Dillon PT, DPT 10/11/2020, 5:00 PM  [place] MAIN [facility] SERVICES [address] [place], [place], [address] Phone: [phone]   Fax:  [phone]  Name: Ashton Belote MRN: [id number] Date of Birth: Nov 23, 1940

## 2020-10-11 NOTE — Therapy (Deleted)
[place] MAIN [facility] SERVICES [address] [place], [place], [address] Phone: [phone]   Fax:  [phone]  Physical Therapy Treatment  Patient Details  Name: Michael Cohen MRN: [id number] Date of Birth: May 21, 1941 Referring Provider (PT): [name]   Encounter Date: 10/11/2020    Past Medical History:  Diagnosis Date  . Diabetes mellitus without complication ([place])   . Hypercholesteremia   . Hypertension     Past Surgical History:  Procedure Laterality Date  . CATARACT EXTRACTION Bilateral   . COLONOSCOPY WITH PROPOFOL N/A 12/12/2017   Procedure: COLONOSCOPY WITH PROPOFOL;  Surgeon: [name], [name], MD;  Location: [facility] ENDOSCOPY;  Service: Gastroenterology;  Laterality: N/A;  . EYE SURGERY      There were no vitals filed for this visit.   TREATMENT    Next to support surface:  Neuro:  Airex pad forward/backward stepping, one arm support on bar, CGA - x multiple reps, VC for technique  Airex pad side stepping, one arm support on bar, CGA - x multiple reps, VC for technique   airex pad: 3 cone taps SUE support, 8x each LE  Standing on airex pad ball toss in front and side to side - x several minutes  Star excursion 3x each LE  Obstacle course with stepping over hurdles, CGA - 6x two instance of LOB with R LE trying to clear hurdle.  Therex:  Nu Step x L3 x 4 mins; for cardiovascular strength and musculoskeletal challenge ; seat position 10, arms at 9, RPM> 60  STS with arms out in front  2x6       Pt educated throughout session about proper posture and technique with exercises. Improved exercise technique, movement at target joints, use of target muscles after min to mod verbal, visual, tactile cues   Assessment: Pt has difficulty with maintaining balance and sustaining SLS on R LE compared to L LE with star excursion exercise. Pt had two instances of LOB with orange hurdle exercise but was able to regain  balance with step and CGA from therapist. Pt requires UUE support for majority of balance exercises. The pt will benefit from further skilled PT to improve BLE strength and static and dynamic balance.     PT Short Term Goals - 09/08/20 1541      PT SHORT TERM GOAL #1   Title Patient will be independent in home exercise program to improve strength/mobility for better functional independence with ADLs.    Baseline patient is compliant with all his HEP; attending water aerobics on days he's not coming to therapy.    Time 4    Period Weeks    Status Achieved    Target Date 08/03/20      PT SHORT TERM GOAL #2   Title Patient will increase RLE gross strength to 4+/5 as to improve functional strength for independent gait, increased standing tolerance and increased ADL ability.    Baseline 12/21: RLE gross strength 4/5 2/23: see note    Time 4    Period Weeks    Status On-going    Target Date 08/03/20             PT Long Term Goals - 09/29/20 1449      PT LONG TERM GOAL #1   Title Patient will increase FOTO score to equal to or greater than 67 to demonstrate statistically significant improvement in mobility and quality of life.    Baseline 12/21: 57 2/23: 74%  Time 8    Period Weeks    Status Achieved      PT LONG TERM GOAL #2   Title Patient (> 26 years old) will complete five times sit to stand test in <12 seconds indicating an increased LE strength and improved balance.    Baseline 12/21: 17.65s, 01/26: 14.47s; 3/16: 13.17sec    Time 8    Period Weeks    Status Revised    Target Date 10/26/20      PT LONG TERM GOAL #3   Title Patient will increase six minute walk test distance to >1000 for progression to community ambulator and improve gait ability    Baseline 12/21: 500, 01/26: 655 ft 2/23: 735 ft; 3/16: 646ft    Time 8    Period Weeks    Status On-going    Target Date 10/26/20      PT LONG TERM GOAL #4   Title Patient will reduce timed up and go to <11 seconds to  reduce fall risk and demonstrate improved transfer/gait ability.    Baseline 12/21: 16.78s, 01/26: 12.08s 2/23: 10 sec    Time 8    Period Weeks    Status Achieved    Target Date 08/31/20      PT LONG TERM GOAL #5   Title Patient will increase lower extremity functional scale to >60/80 to demonstrate improved functional mobility and increased tolerance with ADLs.    Baseline 12/21: 50/80, 01/26: 62/80    Time 8    Period Weeks    Status Achieved    Target Date 08/31/20                  Patient will benefit from skilled therapeutic intervention in order to improve the following deficits and impairments:     Visit Diagnosis: No diagnosis found.     Problem List There are no problems to display for this patient.   Zollie Pee 10/11/2020, 12:59 PM  [place] MAIN [facility] SERVICES [address] [place], [place], [address] Phone: [phone]   Fax:  [phone]  Name: Michael Cohen MRN: [id number] Date of Birth: October 18, 1940

## 2020-10-13 ENCOUNTER — Ambulatory Visit: Payer: Medicare Other

## 2020-10-13 ENCOUNTER — Other Ambulatory Visit: Payer: Self-pay

## 2020-10-13 DIAGNOSIS — M6281 Muscle weakness (generalized): Secondary | ICD-10-CM

## 2020-10-13 DIAGNOSIS — R2681 Unsteadiness on feet: Secondary | ICD-10-CM

## 2020-10-13 DIAGNOSIS — R269 Unspecified abnormalities of gait and mobility: Secondary | ICD-10-CM

## 2020-10-13 DIAGNOSIS — M25551 Pain in right hip: Secondary | ICD-10-CM

## 2020-10-13 DIAGNOSIS — R2689 Other abnormalities of gait and mobility: Secondary | ICD-10-CM

## 2020-10-13 NOTE — Therapy (Signed)
[place] MAIN [facility] SERVICES [address] [place], [place], [address] Phone: [phone]   Fax:  [phone]  Physical Therapy Treatment  Patient Details  Name: Michael Cohen MRN: [id number] Date of Birth: 1941/03/27 Referring Provider (PT): [name]   Encounter Date: 10/13/2020   PT End of Session - 10/13/20 1433    Visit Number 25    Number of Visits 33    Date for PT Re-Evaluation 10/26/20    Authorization Type Medicare Parts A & B    Authorization Time Period Cert 8/93/81-0/17/51    PT Start Time 1430    PT Stop Time 1515    PT Time Calculation (min) 45 min    Equipment Utilized During Treatment Gait belt    Activity Tolerance Patient tolerated treatment well;No increased pain    Behavior During Therapy WFL for tasks assessed/performed           Past Medical History:  Diagnosis Date  . Diabetes mellitus without complication ([place])   . Hypercholesteremia   . Hypertension     Past Surgical History:  Procedure Laterality Date  . CATARACT EXTRACTION Bilateral   . COLONOSCOPY WITH PROPOFOL N/A 12/12/2017   Procedure: COLONOSCOPY WITH PROPOFOL;  Surgeon: [name], [name], MD;  Location: [facility] ENDOSCOPY;  Service: Gastroenterology;  Laterality: N/A;  . EYE SURGERY      There were no vitals filed for this visit.   Subjective Assessment - 10/13/20 1431    Subjective Pt reports feeling okay today. Notes minimal LBP that does not radiate into LE. No changes or falls since last session.    Pertinent History 80 y.o. 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 His pain began around a month ago and is located over the proximal lateral thigh and in the right buttocks. He describes his pain as achy more than sharp. He is not sure of any aggravating activities. He reports of having some pain at rest today. He reports associated pain or "tightness" felt along anterior and  lateral portions of the lower leg. He was previously evaluated by [name] in podiatry and diagnosed with tibialis tendinitis. He denies associated groin pain, denies radicular symptoms. He has tried Baclofen without any relief of his symptoms. Of note, the patient states that he was scheduled to go on a cruise this week but was canceled due to the pain he is experiencing. He states that he would be unable to walk around and participate fully in his vacation given his pain. An anteroposterior view of the pelvis and anteroposterior and lateral views of the right hip were obtained. Images reveal mild loss of femoral acetabular joint space with mild osteophyte formation noted. CAM deformity of the right hip noted. No fractures or dislocations. Anteroposterior, oblique, and lateral views of the lumbar spine were obtained. Images reveal no scoliosis. Mild loss of lumbar lordosis noted. Significant loss of disc space noted at L4-L5 and L5-S1. Intervertebral osteophyte formation of L4 and L5. No fractures or wedging noted. Calcification of the abdominal aorta noted. Degeneration of the lumbar facets noted. Patient was recently diagnosed with lumbar radiculoapthy due to symptoms being more consistent vs hip pathology given the absence of hip symptoms with provocative maneuvers. Prednisone tapers have been considered but is on hold for now due to patient's diabetes.    Limitations Walking;Standing;Lifting;Sitting    How long can you sit comfortably? Few hours    How long  can you stand comfortably? 30 mins    How long can you walk comfortably? 1 hour    Currently in Pain? Yes    Pain Score 1     Pain Location Back    Pain Descriptors / Indicators Aching;Dull    Pain Type Chronic pain           Therex: -Nu Step x L3 x 4 mins; for cardiovascular strength and musculoskeletal challenge ; seat position 10, arms at 9, VC for RPM> 60 -STS with arms out in front,  2x8    Neuro: - Paloff press bil, NBOS 7.5#,  F02OV - Airex pad forward/backward stepping, one arm support on bar, CGA - 4 reps, VC for technique - Airex pad side stepping, 2 finger support on bar, CGA - 4 reps, VC for technique  - Airex pad NBOS: 2x30s each LE, no support, CGA - Airex pad: static marching, 3x30s, no UE support - Airex pad: NBOS ball toss 2x2' without UE support, reaching across body - Star excursion 5x each LE, CGA; VC for technique, with mirror feedback; pt with multiple instances of postural instability     Pt educated throughout session about proper posture and technique with exercises. Improved exercise technique, movement at target joints, use of target muscles after min to mod verbal, visual, tactile cues.      Assessment: Pt tolerated progression of treatment well today. Treatment focused on LE strengthening and proximal stability for improved balance, pain, and safety with mobility. Pt able to decrease UE support with balance exercise, but continues to require assistance via UE support / PT assist with balance on compliant surface and with NBOS. Increased difficulty with star excursion in mirror due to poor proprioception and poor SLS stability. Pt will continue to benefit from skilled OPPT services to address deficits for improved safety with functional mobility. Will continue per POC.     PT Education - 10/13/20 1433    Education Details exercise technique, body mechanics    Person(s) Educated Patient    Methods Explanation    Comprehension Verbalized understanding;Verbal cues required;Returned demonstration;Tactile cues required            PT Short Term Goals - 09/08/20 1541      PT SHORT TERM GOAL #1   Title Patient will be independent in home exercise program to improve strength/mobility for better functional independence with ADLs.    Baseline patient is compliant with all his HEP; attending water aerobics on days he's not coming to therapy.    Time 4    Period Weeks    Status Achieved     Target Date 08/03/20      PT SHORT TERM GOAL #2   Title Patient will increase RLE gross strength to 4+/5 as to improve functional strength for independent gait, increased standing tolerance and increased ADL ability.    Baseline 12/21: RLE gross strength 4/5 2/23: see note    Time 4    Period Weeks    Status On-going    Target Date 08/03/20             PT Long Term Goals - 09/29/20 1449      PT LONG TERM GOAL #1   Title Patient will increase FOTO score to equal to or greater than 67 to demonstrate statistically significant improvement in mobility and quality of life.    Baseline 12/21: 57 2/23: 74%    Time 8    Period Weeks    Status Achieved  PT LONG TERM GOAL #2   Title Patient (> 59 years old) will complete five times sit to stand test in <12 seconds indicating an increased LE strength and improved balance.    Baseline 12/21: 17.65s, 01/26: 14.47s; 3/16: 13.17sec    Time 8    Period Weeks    Status Revised    Target Date 10/26/20      PT LONG TERM GOAL #3   Title Patient will increase six minute walk test distance to >1000 for progression to community ambulator and improve gait ability    Baseline 12/21: 500, 01/26: 655 ft 2/23: 735 ft; 3/16: 671ft    Time 8    Period Weeks    Status On-going    Target Date 10/26/20      PT LONG TERM GOAL #4   Title Patient will reduce timed up and go to <11 seconds to reduce fall risk and demonstrate improved transfer/gait ability.    Baseline 12/21: 16.78s, 01/26: 12.08s 2/23: 10 sec    Time 8    Period Weeks    Status Achieved    Target Date 08/31/20      PT LONG TERM GOAL #5   Title Patient will increase lower extremity functional scale to >60/80 to demonstrate improved functional mobility and increased tolerance with ADLs.    Baseline 12/21: 50/80, 01/26: 62/80    Time 8    Period Weeks    Status Achieved    Target Date 08/31/20                 Plan - 10/13/20 1519    Clinical Impression Statement Pt  tolerated progression of treatment well today. Treatment focused on LE strengthening and proximal stability for improved balance, pain, and safety with mobility. Pt able to decrease UE support with balance exercise, but continues to require assistance via UE support / PT assist with balance on compliant surface and with NBOS. Increased difficulty with star excursion in mirror due to poor proprioception and poor SLS stability. Pt will continue to benefit from skilled OPPT services to address deficits for improved safety with functional mobility. Will continue per POC.    Personal Factors and Comorbidities Comorbidity 3+;Time since onset of injury/illness/exacerbation    Comorbidities diabetes, hypertension, hyperlipidemia    Examination-Activity Limitations Squat;Stairs;Transfers    Examination-Participation Restrictions [place]    Stability/Clinical Decision Making Evolving/Moderate complexity    Rehab Potential Fair    PT Frequency 1x / week    PT Duration 2 weeks    PT Treatment/Interventions Moist Heat;Electrical Stimulation;Gait training;Stair training;Functional mobility training;Therapeutic activities;Therapeutic exercise;Balance training;Neuromuscular re-education;Patient/family education;Manual techniques;Passive range of motion;Energy conservation    PT Next Visit Plan Advance multiplanar, multi surface balance training; Right hip abduction, ER strength;    PT Home Exercise Plan no updates    Consulted and Agree with Plan of Care Patient           Patient will benefit from skilled therapeutic intervention in order to improve the following deficits and impairments:  Abnormal gait,Decreased endurance,Impaired sensation,Decreased activity tolerance,Decreased strength,Decreased balance,Decreased mobility,Difficulty walking,Decreased range of motion,Decreased safety awareness,Improper body mechanics  Visit Diagnosis: Unsteadiness on feet  Muscle weakness (generalized)  Other abnormalities  of gait and mobility  Gait abnormality  Pain in right hip     Problem List There are no problems to display for this patient.  Herminio Commons, PT, DPT 3:32 PM,10/13/20  [place] MAIN [facility] SERVICES [place], [place],  [place] Phone: [phone]   Fax:  [phone]  Name: Michael Cohen MRN: [id number] Date of Birth: 08-17-1940

## 2020-10-18 ENCOUNTER — Other Ambulatory Visit: Payer: Self-pay

## 2020-10-18 ENCOUNTER — Ambulatory Visit: Payer: Medicare Other | Attending: Podiatry

## 2020-10-18 DIAGNOSIS — R269 Unspecified abnormalities of gait and mobility: Secondary | ICD-10-CM | POA: Insufficient documentation

## 2020-10-18 DIAGNOSIS — M25551 Pain in right hip: Secondary | ICD-10-CM | POA: Diagnosis present

## 2020-10-18 DIAGNOSIS — R2681 Unsteadiness on feet: Secondary | ICD-10-CM | POA: Insufficient documentation

## 2020-10-18 DIAGNOSIS — M6281 Muscle weakness (generalized): Secondary | ICD-10-CM | POA: Insufficient documentation

## 2020-10-18 DIAGNOSIS — R2689 Other abnormalities of gait and mobility: Secondary | ICD-10-CM | POA: Insufficient documentation

## 2020-10-18 NOTE — Therapy (Signed)
[place] MAIN [facility] SERVICES [address] [place], [place], [address] Phone: [phone]   Fax:  [phone]  Physical Therapy Treatment  Patient Details  Name: Michael Cohen MRN: [id number] Date of Birth: May 16, 1941 Referring Provider (PT): [name]   Encounter Date: 10/18/2020   PT End of Session - 10/18/20 1304    Visit Number 26    Number of Visits 33    Date for PT Re-Evaluation 10/26/20    Authorization Type Medicare Parts A & B    Authorization Time Period Cert 11/18/37-7/67/34    PT Start Time 1302    PT Stop Time 1345    PT Time Calculation (min) 43 min    Equipment Utilized During Treatment Gait belt    Activity Tolerance Patient tolerated treatment well;No increased pain    Behavior During Therapy WFL for tasks assessed/performed           Past Medical History:  Diagnosis Date  . Diabetes mellitus without complication ([name])   . Hypercholesteremia   . Hypertension     Past Surgical History:  Procedure Laterality Date  . CATARACT EXTRACTION Bilateral   . COLONOSCOPY WITH PROPOFOL N/A 12/12/2017   Procedure: COLONOSCOPY WITH PROPOFOL;  Surgeon: [name], [name], MD;  Location: [facility] ENDOSCOPY;  Service: Gastroenterology;  Laterality: N/A;  . EYE SURGERY      There were no vitals filed for this visit.   Subjective Assessment - 10/18/20 1307    Subjective Patient reports no falls or LOB since last session. Went to the [employer] over the weekend without issue.    Pertinent History 80 y.o. 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 His pain began around a month ago and is located over the proximal lateral thigh and in the right buttocks. He describes his pain as achy more than sharp. He is not sure of any aggravating activities. He reports of having some pain at rest today. He reports associated pain or "tightness" felt along anterior and lateral  portions of the lower leg. He was previously evaluated by [name] in podiatry and diagnosed with tibialis tendinitis. He denies associated groin pain, denies radicular symptoms. He has tried Baclofen without any relief of his symptoms. Of note, the patient states that he was scheduled to go on a cruise this week but was canceled due to the pain he is experiencing. He states that he would be unable to walk around and participate fully in his vacation given his pain. An anteroposterior view of the pelvis and anteroposterior and lateral views of the right hip were obtained. Images reveal mild loss of femoral acetabular joint space with mild osteophyte formation noted. CAM deformity of the right hip noted. No fractures or dislocations. Anteroposterior, oblique, and lateral views of the lumbar spine were obtained. Images reveal no scoliosis. Mild loss of lumbar lordosis noted. Significant loss of disc space noted at L4-L5 and L5-S1. Intervertebral osteophyte formation of L4 and L5. No fractures or wedging noted. Calcification of the abdominal aorta noted. Degeneration of the lumbar facets noted. Patient was recently diagnosed with lumbar radiculoapthy due to symptoms being more consistent vs hip pathology given the absence of hip symptoms with provocative maneuvers. Prednisone tapers have been considered but is on hold for now due to patient's diabetes.    Limitations Walking;Standing;Lifting;Sitting    How long can you sit comfortably? Few hours    How long can you  stand comfortably? 30 mins    How long can you walk comfortably? 1 hour    Currently in Pain? Yes    Pain Score 1     Pain Location Hip    Pain Orientation Right    Pain Descriptors / Indicators Aching    Pain Type Chronic pain               Therex: -Nu Step x L3 x 4 mins; for cardiovascular strength and musculoskeletal challenge ; seat position 9, arms at 9, VC for RPM> 60  6" step ups; no UE support 5x each LE     Neuro: - airex  pad orange hurdle sandwich: lateral step over 10x each direction with finger tip support -airex pad orange hurdle sandwhich: single leg step over/back 10x each LE; cue for forward weight shift and return to upright posture.  -airex JME:QASTMH stand with narrow BOS 30 seconds no support  -airex pad: modified rhomberg stance 30 seconds each LE placement.  -airex pad: ball toss to target for coordination and pertubation's x 16 balls  KoreBalance machine: tux racer for coordination, stabilization against pertubation's, and weight shift . Three sets of 1 minute.  Standing cone reach forward, rotate and turn to hand to PT. X 7 cones in varying arc of motion per PT guidance ; cross body coordination 7 cones   Pt educated throughout session about proper posture and technique with exercises. Improved exercise technique, movement at target joints, use of target muscles after min to mod verbal, visual, tactile cues.                       PT Education - 10/18/20 1304    Education Details exercise technique, body mechanics    Person(s) Educated Patient    Methods Explanation;Tactile cues;Demonstration;Verbal cues    Comprehension Verbalized understanding;Returned demonstration;Verbal cues required;Tactile cues required            PT Short Term Goals - 09/08/20 1541      PT SHORT TERM GOAL #1   Title Patient will be independent in home exercise program to improve strength/mobility for better functional independence with ADLs.    Baseline patient is compliant with all his HEP; attending water aerobics on days he's not coming to therapy.    Time 4    Period Weeks    Status Achieved    Target Date 08/03/20      PT SHORT TERM GOAL #2   Title Patient will increase RLE gross strength to 4+/5 as to improve functional strength for independent gait, increased standing tolerance and increased ADL ability.    Baseline 12/21: RLE gross strength 4/5 2/23: see note    Time 4    Period Weeks     Status On-going    Target Date 08/03/20             PT Long Term Goals - 09/29/20 1449      PT LONG TERM GOAL #1   Title Patient will increase FOTO score to equal to or greater than 67 to demonstrate statistically significant improvement in mobility and quality of life.    Baseline 12/21: 57 2/23: 74%    Time 8    Period Weeks    Status Achieved      PT LONG TERM GOAL #2   Title Patient (> 78 years old) will complete five times sit to stand test in <12 seconds indicating an increased LE strength and improved balance.  Baseline 12/21: 17.65s, 01/26: 14.47s; 3/16: 13.17sec    Time 8    Period Weeks    Status Revised    Target Date 10/26/20      PT LONG TERM GOAL #3   Title Patient will increase six minute walk test distance to >1000 for progression to community ambulator and improve gait ability    Baseline 12/21: 500, 01/26: 655 ft 2/23: 735 ft; 3/16: 627ft    Time 8    Period Weeks    Status On-going    Target Date 10/26/20      PT LONG TERM GOAL #4   Title Patient will reduce timed up and go to <11 seconds to reduce fall risk and demonstrate improved transfer/gait ability.    Baseline 12/21: 16.78s, 01/26: 12.08s 2/23: 10 sec    Time 8    Period Weeks    Status Achieved    Target Date 08/31/20      PT LONG TERM GOAL #5   Title Patient will increase lower extremity functional scale to >60/80 to demonstrate improved functional mobility and increased tolerance with ADLs.    Baseline 12/21: 50/80, 01/26: 62/80    Time 8    Period Weeks    Status Achieved    Target Date 08/31/20                 Plan - 10/18/20 1317    Clinical Impression Statement Patient tolerates progressive exercises well without compliant. He is able to perform stabilization interventions on airex pad. Treatment focused on LE strengthening and stabilization interventions however he requires frequent use of UE's to support himself.  He requires occasional cues for task orientation. The  pt will benefit from further skilled PT to improve BLE strength and static and dynamic balance to increase safety with all ADLs    Personal Factors and Comorbidities Comorbidity 3+;Time since onset of injury/illness/exacerbation    Comorbidities diabetes, hypertension, hyperlipidemia    Examination-Activity Limitations Squat;Stairs;Transfers    Examination-Participation Restrictions [place]    Stability/Clinical Decision Making Evolving/Moderate complexity    Rehab Potential Fair    PT Frequency 1x / week    PT Duration 2 weeks    PT Treatment/Interventions Moist Heat;Electrical Stimulation;Gait training;Stair training;Functional mobility training;Therapeutic activities;Therapeutic exercise;Balance training;Neuromuscular re-education;Patient/family education;Manual techniques;Passive range of motion;Energy conservation    PT Next Visit Plan Advance multiplanar, multi surface balance training; Right hip abduction, ER strength;    PT Home Exercise Plan no updates    Consulted and Agree with Plan of Care Patient           Patient will benefit from skilled therapeutic intervention in order to improve the following deficits and impairments:  Abnormal gait,Decreased endurance,Impaired sensation,Decreased activity tolerance,Decreased strength,Decreased balance,Decreased mobility,Difficulty walking,Decreased range of motion,Decreased safety awareness,Improper body mechanics  Visit Diagnosis: Unsteadiness on feet  Muscle weakness (generalized)  Other abnormalities of gait and mobility  Gait abnormality     Problem List There are no problems to display for this patient.  Janna Arch, PT, DPT    10/18/2020, 1:47 PM  [place] MAIN [facility] SERVICES [address] [place], [place], [address] Phone: [phone]   Fax:  [phone]  Name: Keysean Savino MRN: [id number] Date of Birth: 1941/02/19

## 2020-10-20 ENCOUNTER — Other Ambulatory Visit: Payer: Self-pay

## 2020-10-20 ENCOUNTER — Ambulatory Visit: Payer: Medicare Other

## 2020-10-20 DIAGNOSIS — M6281 Muscle weakness (generalized): Secondary | ICD-10-CM

## 2020-10-20 DIAGNOSIS — R2681 Unsteadiness on feet: Secondary | ICD-10-CM

## 2020-10-20 DIAGNOSIS — R2689 Other abnormalities of gait and mobility: Secondary | ICD-10-CM

## 2020-10-20 NOTE — Therapy (Signed)
[place] MAIN [facility] SERVICES [address] [place], [place], [address] Phone: [phone]   Fax:  [phone]  Physical Therapy Treatment  Patient Details  Name: Michael Cohen MRN: [id number] Date of Birth: 1940-07-21 Referring Provider (PT): [name]   Encounter Date: 10/20/2020   PT End of Session - 10/20/20 1435    Visit Number 27    Number of Visits 33    Date for PT Re-Evaluation 10/26/20    Authorization Type Medicare Parts A & B    Authorization Time Period Cert 3/66/44-0/34/74    PT Start Time 1430    PT Stop Time 1514    PT Time Calculation (min) 44 min    Equipment Utilized During Treatment Gait belt    Activity Tolerance Patient tolerated treatment well;No increased pain    Behavior During Therapy WFL for tasks assessed/performed           Past Medical History:  Diagnosis Date  . Diabetes mellitus without complication ([place])   . Hypercholesteremia   . Hypertension     Past Surgical History:  Procedure Laterality Date  . CATARACT EXTRACTION Bilateral   . COLONOSCOPY WITH PROPOFOL N/A 12/12/2017   Procedure: COLONOSCOPY WITH PROPOFOL;  Surgeon: [name], [name], MD;  Location: [facility] ENDOSCOPY;  Service: Gastroenterology;  Laterality: N/A;  . EYE SURGERY      There were no vitals filed for this visit.   Subjective Assessment - 10/20/20 1436    Subjective Patient reports compliance with HEP. No falls or LOB since last session.    Pertinent History 80 y.o. 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 His pain began around a month ago and is located over the proximal lateral thigh and in the right buttocks. He describes his pain as achy more than sharp. He is not sure of any aggravating activities. He reports of having some pain at rest today. He reports associated pain or "tightness" felt along anterior and lateral portions of the lower leg. He was previously  evaluated by [name] in podiatry and diagnosed with tibialis tendinitis. He denies associated groin pain, denies radicular symptoms. He has tried Baclofen without any relief of his symptoms. Of note, the patient states that he was scheduled to go on a cruise this week but was canceled due to the pain he is experiencing. He states that he would be unable to walk around and participate fully in his vacation given his pain. An anteroposterior view of the pelvis and anteroposterior and lateral views of the right hip were obtained. Images reveal mild loss of femoral acetabular joint space with mild osteophyte formation noted. CAM deformity of the right hip noted. No fractures or dislocations. Anteroposterior, oblique, and lateral views of the lumbar spine were obtained. Images reveal no scoliosis. Mild loss of lumbar lordosis noted. Significant loss of disc space noted at L4-L5 and L5-S1. Intervertebral osteophyte formation of L4 and L5. No fractures or wedging noted. Calcification of the abdominal aorta noted. Degeneration of the lumbar facets noted. Patient was recently diagnosed with lumbar radiculoapthy due to symptoms being more consistent vs hip pathology given the absence of hip symptoms with provocative maneuvers. Prednisone tapers have been considered but is on hold for now due to patient's diabetes.    Limitations Walking;Standing;Lifting;Sitting    How long can you sit comfortably? Few hours    How long can you stand comfortably? 30 mins  How long can you walk comfortably? 1 hour    Currently in Pain? Yes    Pain Score 1     Pain Location Hip    Pain Orientation Right    Pain Descriptors / Indicators Aching    Pain Type Chronic pain    Pain Onset More than a month ago    Pain Frequency Intermittent                Nu Step x L3 x 4 mins; for cardiovascular strength and musculoskeletal challenge ; seat position 10, arms at 9, RPM> 60     Next to support surface: airex pad: 6" step  airex sandwhich: lateral stepping 10x each LE, BUE support airex pad: 6" step airex sandwhich:; forward step up/down , BUE support 10x each LE.  airex pad: 6" step static stand 30 second holds each LE placement  airex pad: 3 cone taps SUE support, 8x each LE    Standing with # 3lb ankle weight: CGA for stability   -Hip extension with bilateral upper extremity support, cueing for neutral hip alignment, upright posture for optimal muscle recruitment, and sequencing, 10x each LE,  -Hip abduction with bilateral upper extremity support, cueing for neutral foot alignment for correct muscle activation, 10x each LE. BUE support  -marches with bilateral upper extremity support, cueing for body mechanics, speed of muscle recruitment for optimal strengthening and stabilization 4x length of // bars -Hamstring curl with bilateral upper extremity support, cueing for knee alignment for recruitment of hamstring musculature, 10x each LE    seated 3# ankle weight: -LAQ 10x     Pt educated throughout session about proper posture and technique with exercises. Improved exercise technique, movement at target joints, use of target muscles after min to mod verbal, visual, tactile cues   Patient is aware next week will address plan of care and potential recert and/or discharge pending patient progress. He continues to be challenged by unstable surfaces with no UE support but is progressing with tandem and single limb stabilization with pertubation's and ankle righting reactions. The pt will benefit from further skilled PT to improve BLE strength and static and dynamic balance to increase safety with all ADLs                      PT Education - 10/20/20 1435    Education Details exercise technique, body mechanics    Person(s) Educated Patient    Methods Explanation;Demonstration;Tactile cues;Verbal cues    Comprehension Verbalized understanding;Returned demonstration;Verbal cues required;Tactile  cues required            PT Short Term Goals - 09/08/20 1541      PT SHORT TERM GOAL #1   Title Patient will be independent in home exercise program to improve strength/mobility for better functional independence with ADLs.    Baseline patient is compliant with all his HEP; attending water aerobics on days he's not coming to therapy.    Time 4    Period Weeks    Status Achieved    Target Date 08/03/20      PT SHORT TERM GOAL #2   Title Patient will increase RLE gross strength to 4+/5 as to improve functional strength for independent gait, increased standing tolerance and increased ADL ability.    Baseline 12/21: RLE gross strength 4/5 2/23: see note    Time 4    Period Weeks    Status On-going    Target Date 08/03/20  PT Long Term Goals - 09/29/20 1449      PT LONG TERM GOAL #1   Title Patient will increase FOTO score to equal to or greater than 67 to demonstrate statistically significant improvement in mobility and quality of life.    Baseline 12/21: 57 2/23: 74%    Time 8    Period Weeks    Status Achieved      PT LONG TERM GOAL #2   Title Patient (> 71 years old) will complete five times sit to stand test in <12 seconds indicating an increased LE strength and improved balance.    Baseline 12/21: 17.65s, 01/26: 14.47s; 3/16: 13.17sec    Time 8    Period Weeks    Status Revised    Target Date 10/26/20      PT LONG TERM GOAL #3   Title Patient will increase six minute walk test distance to >1000 for progression to community ambulator and improve gait ability    Baseline 12/21: 500, 01/26: 655 ft 2/23: 735 ft; 3/16: 663ft    Time 8    Period Weeks    Status On-going    Target Date 10/26/20      PT LONG TERM GOAL #4   Title Patient will reduce timed up and go to <11 seconds to reduce fall risk and demonstrate improved transfer/gait ability.    Baseline 12/21: 16.78s, 01/26: 12.08s 2/23: 10 sec    Time 8    Period Weeks    Status Achieved    Target  Date 08/31/20      PT LONG TERM GOAL #5   Title Patient will increase lower extremity functional scale to >60/80 to demonstrate improved functional mobility and increased tolerance with ADLs.    Baseline 12/21: 50/80, 01/26: 62/80    Time 8    Period Weeks    Status Achieved    Target Date 08/31/20                 Plan - 10/20/20 1519    Clinical Impression Statement Patient is aware next week will address plan of care and potential recert and/or discharge pending patient progress. He continues to be challenged by unstable surfaces with no UE support but is progressing with tandem and single limb stabilization with pertubation's and ankle righting reactions. The pt will benefit from further skilled PT to improve BLE strength and static and dynamic balance to increase safety with all ADLs    Personal Factors and Comorbidities Comorbidity 3+;Time since onset of injury/illness/exacerbation    Comorbidities diabetes, hypertension, hyperlipidemia    Examination-Activity Limitations Squat;Stairs;Transfers    Examination-Participation Restrictions [employer]    Stability/Clinical Decision Making Evolving/Moderate complexity    Rehab Potential Fair    PT Frequency 1x / week    PT Duration 2 weeks    PT Treatment/Interventions Moist Heat;Electrical Stimulation;Gait training;Stair training;Functional mobility training;Therapeutic activities;Therapeutic exercise;Balance training;Neuromuscular re-education;Patient/family education;Manual techniques;Passive range of motion;Energy conservation    PT Next Visit Plan Advance multiplanar, multi surface balance training; Right hip abduction, ER strength;    PT Home Exercise Plan no updates    Consulted and Agree with Plan of Care Patient           Patient will benefit from skilled therapeutic intervention in order to improve the following deficits and impairments:  Abnormal gait,Decreased endurance,Impaired sensation,Decreased activity  tolerance,Decreased strength,Decreased balance,Decreased mobility,Difficulty walking,Decreased range of motion,Decreased safety awareness,Improper body mechanics  Visit Diagnosis: Unsteadiness on feet  Muscle weakness (generalized)  Other abnormalities  of gait and mobility     Problem List There are no problems to display for this patient.  Janna Arch, PT, DPT   10/20/2020, 3:20 PM  [place] MAIN [facility] SERVICES [address] [place], [place], [address] Phone: [phone]   Fax:  [phone]  Name: Jyair Kiraly MRN: [id number] Date of Birth: 1941/04/27

## 2020-10-25 ENCOUNTER — Other Ambulatory Visit: Payer: Self-pay

## 2020-10-25 ENCOUNTER — Ambulatory Visit: Payer: Medicare Other

## 2020-10-25 DIAGNOSIS — M25551 Pain in right hip: Secondary | ICD-10-CM

## 2020-10-25 DIAGNOSIS — M6281 Muscle weakness (generalized): Secondary | ICD-10-CM

## 2020-10-25 DIAGNOSIS — R2689 Other abnormalities of gait and mobility: Secondary | ICD-10-CM

## 2020-10-25 DIAGNOSIS — R2681 Unsteadiness on feet: Secondary | ICD-10-CM | POA: Diagnosis not present

## 2020-10-25 NOTE — Therapy (Signed)
[place] MAIN [facility] SERVICES [address] [place], [place], [address] Phone: [phone]   Fax:  [phone]  Physical Therapy Treatment/RECERTIFICATION  Patient Details  Name: Michael Cohen MRN: [id number] Date of Birth: [birth year] Referring Provider (PT): [name]   Encounter Date: 10/25/2020   PT End of Session - 10/25/20 1736    Visit Number 28    Number of Visits 45    Date for PT Re-Evaluation 10/26/20    Authorization Type Medicare Parts A & B    Authorization Time Period Cert 11/09/81-11/02/60    PT Start Time 2297    PT Stop Time 1430    PT Time Calculation (min) 41 min    Equipment Utilized During Treatment Gait belt    Activity Tolerance Patient tolerated treatment well;No increased pain    Behavior During Therapy WFL for tasks assessed/performed           Past Medical History:  Diagnosis Date  . Diabetes mellitus without complication ([place])   . Hypercholesteremia   . Hypertension     Past Surgical History:  Procedure Laterality Date  . CATARACT EXTRACTION Bilateral   . COLONOSCOPY WITH PROPOFOL N/A 12/12/2017   Procedure: COLONOSCOPY WITH PROPOFOL;  Surgeon: [name], [name], MD;  Location: [facility] ENDOSCOPY;  Service: Gastroenterology;  Laterality: N/A;  . EYE SURGERY      There were no vitals filed for this visit.   TREATMENT: THEREX: MMT: grossly 5/5 BLEs except R hip flexion 4+/5   5xSTS 9.5 sec  6MWT 737 ft; pt wearing sandals today with R hip pain possibly impacting performance on test.  Standing hip abduction 3x10 BLEs (support at bar); VC/demo for technique  Seated hamstring stretches 2x30 sec BLEs; VC/demo for technique  Seated FABER stretch 2x30 sec BLEs; VC/demo for technique  Pt educated throughout session in the form of VC/TC and demo for correct movement at target joints and muscle activation with exercises. Pt with good carryover with cuing. Pt also educated on indications of reassessment  on progress and POC.  Assessment: Goals retested for recert this session. Pt has met MMT goal and 5xSTS goal (see goals), indicating improvements in BLE strength and power and decreased fall risk. Pt did show improvement on 6MWT goal, by ambulating 737 ft, but is still below community ambulator status. Moreover, pt with increased R hip pain over the past seven or more days that is affecting patient's gait mechanics, where pt gait is noticeably antalgic. Patient's condition has the potential to improve in response to therapy. Maximum improvement is yet to be obtained. The anticipated improvement is attainable and reasonable in a generally predictable time. The pt will benefit from further skilled physical therapy to improve pain, gait mechanics, and gait endurance and ability to increase QOL and ease with ADLs.    PT Education - 10/25/20 1736    Education Details Pt edcuated on reassessment findings, indications for therapy/POC    Person(s) Educated Patient    Methods Explanation    Comprehension Verbalized understanding            PT Short Term Goals - 10/25/20 1737      PT SHORT TERM GOAL #1   Title Patient will be independent in home exercise program to improve strength/mobility for better functional independence with ADLs.    Baseline patient is compliant with all his HEP; attending water aerobics on days he's not coming to therapy.    Time 4    Period Weeks  Status Achieved    Target Date 08/03/20      PT SHORT TERM GOAL #2   Title Patient will increase RLE gross strength to 4+/5 as to improve functional strength for independent gait, increased standing tolerance and increased ADL ability.    Baseline 12/21: RLE gross strength 4/5 2/23: see note; 4/11: grossly 5/5 BLEs    Time 4    Period Weeks    Status Achieved    Target Date 08/03/20             PT Long Term Goals - 10/25/20 1737      PT LONG TERM GOAL #1   Title Patient will increase FOTO score to equal to or greater  than 67 to demonstrate statistically significant improvement in mobility and quality of life.    Baseline 12/21: 57 2/23: 74%    Time 8    Period Weeks    Status Achieved      PT LONG TERM GOAL #2   Title Patient (> 53 years old) will complete five times sit to stand test in <12 seconds indicating an increased LE strength and improved balance.    Baseline 12/21: 17.65s, 01/26: 14.47s; 3/16: 13.17sec; 4/11: 8.5 sec    Time 8    Period Weeks    Status Achieved      PT LONG TERM GOAL #3   Title Patient will increase six minute walk test distance to >1000 for progression to community ambulator and improve gait ability    Baseline 12/21: 500, 01/26: 655 ft 2/23: 735 ft; 3/16: 684f; 4/11: 737 ft    Time 6    Period Weeks    Status On-going    Target Date 12/06/20      PT LONG TERM GOAL #4   Title Patient will reduce timed up and go to <11 seconds to reduce fall risk and demonstrate improved transfer/gait ability.    Baseline 12/21: 16.78s, 01/26: 12.08s 2/23: 10 sec    Time 8    Period Weeks    Status Achieved      PT LONG TERM GOAL #5   Title Patient will increase lower extremity functional scale to >60/80 to demonstrate improved functional mobility and increased tolerance with ADLs.    Baseline 12/21: 50/80, 01/26: 62/80    Time 8    Period Weeks    Status Achieved      Additional Long Term Goals   Additional Long Term Goals Yes                 Plan - 10/25/20 1745    Clinical Impression Statement Goals retested for recert this session. Pt has met MMT goal and 5xSTS goal (see goals), indicating improvements in BLE strength and power and decreased fall risk. Pt did show improvement on 6MWT goal, by ambulating 737 ft, but is still below community ambulator status. Moreover, pt with increased R hip pain over the past seven or more days that is affecting patient's gait mechanics, where pt gait is noticeably antalgic. Patient's condition has the potential to improve in  response to therapy. Maximum improvement is yet to be obtained. The anticipated improvement is attainable and reasonable in a generally predictable time. The pt will benefit from further skilled physical therapy to improve pain, gait mechanics, and gait endurance and ability to increase QOL and ease with ADLs.    Personal Factors and Comorbidities Comorbidity 3+;Time since onset of injury/illness/exacerbation    Comorbidities diabetes, hypertension, hyperlipidemia  Examination-Activity Limitations Squat;Stairs;Transfers    Examination-Participation Restrictions [employer]    Stability/Clinical Decision Making Evolving/Moderate complexity    Rehab Potential Fair    PT Frequency 2x / week    PT Duration 6 weeks    PT Treatment/Interventions Moist Heat;Electrical Stimulation;Gait training;Stair training;Functional mobility training;Therapeutic activities;Therapeutic exercise;Balance training;Neuromuscular re-education;Patient/family education;Manual techniques;Passive range of motion;Energy conservation    PT Next Visit Plan Advance multiplanar, multi surface balance training; Right hip abduction, ER strength; assess mm length LEs    PT Home Exercise Plan no updates    Consulted and Agree with Plan of Care Patient           Patient will benefit from skilled therapeutic intervention in order to improve the following deficits and impairments:  Abnormal gait,Decreased endurance,Impaired sensation,Decreased activity tolerance,Decreased strength,Decreased balance,Decreased mobility,Difficulty walking,Decreased range of motion,Decreased safety awareness,Improper body mechanics  Visit Diagnosis: Other abnormalities of gait and mobility  Muscle weakness (generalized)  Pain in right hip     Problem List There are no problems to display for this patient.  Ricard Dillon PT, DPT 10/25/2020, 5:48 PM  [place] MAIN [facility] SERVICES [address] [place],  [place], [address] Phone: [phone]   Fax:  [phone]  Name: Michael Cohen MRN: [id number] Date of Birth: 1940/09/17

## 2020-10-27 ENCOUNTER — Ambulatory Visit: Payer: Medicare Other

## 2020-10-27 DIAGNOSIS — M25551 Pain in right hip: Secondary | ICD-10-CM

## 2020-10-27 DIAGNOSIS — R2689 Other abnormalities of gait and mobility: Secondary | ICD-10-CM

## 2020-10-27 DIAGNOSIS — R2681 Unsteadiness on feet: Secondary | ICD-10-CM | POA: Diagnosis not present

## 2020-10-27 DIAGNOSIS — M6281 Muscle weakness (generalized): Secondary | ICD-10-CM

## 2020-10-27 NOTE — Therapy (Signed)
[place] MAIN [facility] SERVICES [address] [place], [place], [address] Phone: [phone]   Fax:  [phone]  Physical Therapy Treatment  Patient Details  Name: Michael Cohen MRN: [id number] Date of Birth: 15-Aug-1940 Referring Provider (PT): [name]   Encounter Date: 10/27/2020   PT End of Session - 10/27/20 1439    Visit Number 29    Number of Visits 45    Date for PT Re-Evaluation 10/26/20    Authorization Type Medicare Parts A & B    Authorization Time Period Cert 07/17/73-07/18/56    PT Start Time 1346    PT Stop Time 1429    PT Time Calculation (min) 43 min    Equipment Utilized During Treatment Gait belt    Activity Tolerance Patient tolerated treatment well;No increased pain    Behavior During Therapy WFL for tasks assessed/performed           Past Medical History:  Diagnosis Date  . Diabetes mellitus without complication ([place])   . Hypercholesteremia   . Hypertension     Past Surgical History:  Procedure Laterality Date  . CATARACT EXTRACTION Bilateral   . COLONOSCOPY WITH PROPOFOL N/A 12/12/2017   Procedure: COLONOSCOPY WITH PROPOFOL;  Surgeon: [name], [name], MD;  Location: [facility] ENDOSCOPY;  Service: Gastroenterology;  Laterality: N/A;  . EYE SURGERY      There were no vitals filed for this visit.   Subjective Assessment - 10/27/20 1351    Subjective Pt went to water aerobics at [facility] this morning. He reports taking Tylenol prior to therapy d/t R hip pain. Pt pain was 8/10 this morning. With tylenol pt reports pain 3/10.    Pertinent History 80 y.o. 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 His pain began around a month ago and is located over the proximal lateral thigh and in the right buttocks. He describes his pain as achy more than sharp. He is not sure of any aggravating activities. He reports of having some pain at rest today. He reports  associated pain or "tightness" felt along anterior and lateral portions of the lower leg. He was previously evaluated by [name] in podiatry and diagnosed with tibialis tendinitis. He denies associated groin pain, denies radicular symptoms. He has tried Baclofen without any relief of his symptoms. Of note, the patient states that he was scheduled to go on a cruise this week but was canceled due to the pain he is experiencing. He states that he would be unable to walk around and participate fully in his vacation given his pain. An anteroposterior view of the pelvis and anteroposterior and lateral views of the right hip were obtained. Images reveal mild loss of femoral acetabular joint space with mild osteophyte formation noted. CAM deformity of the right hip noted. No fractures or dislocations. Anteroposterior, oblique, and lateral views of the lumbar spine were obtained. Images reveal no scoliosis. Mild loss of lumbar lordosis noted. Significant loss of disc space noted at L4-L5 and L5-S1. Intervertebral osteophyte formation of L4 and L5. No fractures or wedging noted. Calcification of the abdominal aorta noted. Degeneration of the lumbar facets noted. Patient was recently diagnosed with lumbar radiculoapthy due to symptoms being more consistent vs hip pathology given the absence of hip symptoms with provocative maneuvers. Prednisone tapers have been considered but is on hold for now due to patient's diabetes.    Limitations Walking;Standing;Lifting;Sitting    How  long can you sit comfortably? Few hours    How long can you stand comfortably? 30 mins    How long can you walk comfortably? 1 hour    Currently in Pain? Yes    Pain Score 3     Pain Location Hip    Pain Orientation Right    Pain Onset More than a month ago          TREATMENT:  Therex:  Standing hip abduction (no AW) 3x10 BLEs; pt cuing decrease speed of movement to increase time under tension. Demo also provided for technique. No pain  with exercise. Standing hip hike/"sassy" exercise 3x10 BLEs; frequent VC/demo for technique. Pt with difficulty with coordination of movement. Pt reports some R hip pain with exercise. Step ups 10x with each LE leading step, CGA. Demo/VC for technique, in addition to cuing to decrease speed of exercise. Seated hamstring stretch with gastroc stretch 2x60 sec BLEs; VC/demo for technique Seated FABER stretch 2x60 sec BLES; VC/demo for technique Attempted standing hip flexor stretch multiple reps, pt reports feeling stretch in calf but not hip. Standing supported quad stretch 2x60 sec BLEs, VC/Demo for technique  Standing hip extension with 2# AW 2x10 BLEs; Demo and VC to increase ROM and decrease speed of reps.     Education provided throughout session in the form of demonstration, VC/TC in order to facilitate movement at target joints and correct muscle activation with exercises.    PT Education - 10/27/20 1439    Education Details Pt educated on exercise technique, body mechanics with standing hip hike exercise    Person(s) Educated Patient    Methods Explanation;Demonstration;Verbal cues    Comprehension Verbalized understanding;Returned demonstration            PT Short Term Goals - 10/25/20 1737      PT SHORT TERM GOAL #1   Title Patient will be independent in home exercise program to improve strength/mobility for better functional independence with ADLs.    Baseline patient is compliant with all his HEP; attending water aerobics on days he's not coming to therapy.    Time 4    Period Weeks    Status Achieved    Target Date 08/03/20      PT SHORT TERM GOAL #2   Title Patient will increase RLE gross strength to 4+/5 as to improve functional strength for independent gait, increased standing tolerance and increased ADL ability.    Baseline 12/21: RLE gross strength 4/5 2/23: see note; 4/11: grossly 5/5 BLEs    Time 4    Period Weeks    Status Achieved    Target Date 08/03/20              PT Long Term Goals - 10/25/20 1737      PT LONG TERM GOAL #1   Title Patient will increase FOTO score to equal to or greater than 67 to demonstrate statistically significant improvement in mobility and quality of life.    Baseline 12/21: 57 2/23: 74%    Time 8    Period Weeks    Status Achieved      PT LONG TERM GOAL #2   Title Patient (> 16 years old) will complete five times sit to stand test in <12 seconds indicating an increased LE strength and improved balance.    Baseline 12/21: 17.65s, 01/26: 14.47s; 3/16: 13.17sec; 4/11: 8.5 sec    Time 8    Period Weeks    Status Achieved  PT LONG TERM GOAL #3   Title Patient will increase six minute walk test distance to >1000 for progression to community ambulator and improve gait ability    Baseline 12/21: 500, 01/26: 655 ft 2/23: 735 ft; 3/16: 67ft; 4/11: 737 ft    Time 6    Period Weeks    Status On-going    Target Date 12/06/20      PT LONG TERM GOAL #4   Title Patient will reduce timed up and go to <11 seconds to reduce fall risk and demonstrate improved transfer/gait ability.    Baseline 12/21: 16.78s, 01/26: 12.08s 2/23: 10 sec    Time 8    Period Weeks    Status Achieved      PT LONG TERM GOAL #5   Title Patient will increase lower extremity functional scale to >60/80 to demonstrate improved functional mobility and increased tolerance with ADLs.    Baseline 12/21: 50/80, 01/26: 62/80    Time 8    Period Weeks    Status Achieved      Additional Long Term Goals   Additional Long Term Goals Yes                 Plan - 10/27/20 1447    Clinical Impression Statement Pt continues to have high intensity R hip pain in the morning at 8/10 on NPRS. Session focused on LE stretching and strengthening of hip musculature. Pt tolerated majority of therex well, but did report some pain with standing hip hikes in R hip. Pt also with difficulty performing the exercise with correct technique, indicating need to  further improve hip mobility and to have back mobility further assessed/addressed next session. The pt will benefit from further skilled therapy to improve pain, BLE strength and mobility to increase ease with gait and QOL.    Personal Factors and Comorbidities Comorbidity 3+;Time since onset of injury/illness/exacerbation    Comorbidities diabetes, hypertension, hyperlipidemia    Examination-Activity Limitations Squat;Stairs;Transfers    Examination-Participation Restrictions Church    Stability/Clinical Decision Making Evolving/Moderate complexity    Rehab Potential Fair    PT Frequency 2x / week    PT Duration 6 weeks    PT Treatment/Interventions Moist Heat;Electrical Stimulation;Gait training;Stair training;Functional mobility training;Therapeutic activities;Therapeutic exercise;Balance training;Neuromuscular re-education;Patient/family education;Manual techniques;Passive range of motion;Energy conservation    PT Next Visit Plan Advance multiplanar, multi surface balance training; Right hip abduction, ER strength; assess mm length LEs, continue LE stretching and address back mobility    PT Home Exercise Plan no updates    Consulted and Agree with Plan of Care Patient           Patient will benefit from skilled therapeutic intervention in order to improve the following deficits and impairments:  Abnormal gait,Decreased endurance,Impaired sensation,Decreased activity tolerance,Decreased strength,Decreased balance,Decreased mobility,Difficulty walking,Decreased range of motion,Decreased safety awareness,Improper body mechanics  Visit Diagnosis: Other abnormalities of gait and mobility  Muscle weakness (generalized)  Pain in right hip     Problem List There are no problems to display for this patient.  Ricard Dillon PT, DPT 10/27/2020, 2:52 PM  [place] MAIN [facility] SERVICES [address] [place], [place], [address] Phone: [phone]   Fax:   [phone]  Name: Dois Juarbe MRN: [id number] Date of Birth: [birth year]

## 2020-11-01 ENCOUNTER — Encounter: Payer: Self-pay | Admitting: Physical Therapy

## 2020-11-01 ENCOUNTER — Other Ambulatory Visit: Payer: Self-pay

## 2020-11-01 ENCOUNTER — Ambulatory Visit: Payer: Medicare Other | Admitting: Physical Therapy

## 2020-11-01 DIAGNOSIS — R2681 Unsteadiness on feet: Secondary | ICD-10-CM | POA: Diagnosis not present

## 2020-11-01 DIAGNOSIS — M25551 Pain in right hip: Secondary | ICD-10-CM

## 2020-11-01 DIAGNOSIS — R2689 Other abnormalities of gait and mobility: Secondary | ICD-10-CM

## 2020-11-01 DIAGNOSIS — M6281 Muscle weakness (generalized): Secondary | ICD-10-CM

## 2020-11-01 NOTE — Therapy (Signed)
[place] MAIN [facility] SERVICES [address] [place], [place], [address] Phone: [phone]   Fax:  [phone]  Physical Therapy Treatment Physical Therapy Progress Note   Dates of reporting period  09/22/20   to   11/02/20   Patient Details  Name: Michael Cohen MRN: [id number] Date of Birth: [birth year] Referring Provider (PT): [name]   Encounter Date: 11/01/2020   PT End of Session - 11/01/20 1440    Visit Number 30    Number of Visits 45    Date for PT Re-Evaluation 10/26/20    Authorization Type Medicare Parts A & B    Authorization Time Period Cert 0/96/28-3/66/29    PT Start Time 1430    PT Stop Time 1515    PT Time Calculation (min) 45 min    Equipment Utilized During Treatment Gait belt    Activity Tolerance Patient tolerated treatment well;No increased pain    Behavior During Therapy WFL for tasks assessed/performed           Past Medical History:  Diagnosis Date  . Diabetes mellitus without complication ([place])   . Hypercholesteremia   . Hypertension     Past Surgical History:  Procedure Laterality Date  . CATARACT EXTRACTION Bilateral   . COLONOSCOPY WITH PROPOFOL N/A 12/12/2017   Procedure: COLONOSCOPY WITH PROPOFOL;  Surgeon: [name], [name], MD;  Location: [facility] ENDOSCOPY;  Service: Gastroenterology;  Laterality: N/A;  . EYE SURGERY      There were no vitals filed for this visit.   Subjective Assessment - 11/01/20 1434    Subjective Pt reports having increased RLE hip pain first thing in the morning about 8/10 and then after he moves around it will diminish to 2/10. He reports having trouble walking initially but then it gets better.    Pertinent History 80 y.o. 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 His pain began around a month ago and is located over the proximal lateral thigh and in the right buttocks. He describes his pain as achy more  than sharp. He is not sure of any aggravating activities. He reports of having some pain at rest today. He reports associated pain or "tightness" felt along anterior and lateral portions of the lower leg. He was previously evaluated by [name] in podiatry and diagnosed with tibialis tendinitis. He denies associated groin pain, denies radicular symptoms. He has tried Baclofen without any relief of his symptoms. Of note, the patient states that he was scheduled to go on a cruise this week but was canceled due to the pain he is experiencing. He states that he would be unable to walk around and participate fully in his vacation given his pain. An anteroposterior view of the pelvis and anteroposterior and lateral views of the right hip were obtained. Images reveal mild loss of femoral acetabular joint space with mild osteophyte formation noted. CAM deformity of the right hip noted. No fractures or dislocations. Anteroposterior, oblique, and lateral views of the lumbar spine were obtained. Images reveal no scoliosis. Mild loss of lumbar lordosis noted. Significant loss of disc space noted at L4-L5 and L5-S1. Intervertebral osteophyte formation of L4 and L5. No fractures or wedging noted. Calcification of the abdominal aorta noted. Degeneration of the lumbar facets noted. Patient was recently diagnosed with lumbar radiculoapthy due to symptoms being more consistent vs hip pathology given the absence of hip symptoms with provocative maneuvers. Prednisone tapers  have been considered but is on hold for now due to patient's diabetes.    Limitations Walking;Standing;Lifting;Sitting    How long can you sit comfortably? Few hours    How long can you stand comfortably? 30 mins    How long can you walk comfortably? 1 hour    Currently in Pain? Yes    Pain Score 2     Pain Location Hip    Pain Orientation Right    Pain Descriptors / Indicators Aching    Pain Type Chronic pain    Pain Onset More than a month ago     Pain Frequency Intermittent    Aggravating Factors  worse in the morning    Pain Relieving Factors rest/heat    Effect of Pain on Daily Activities decreased mobility;    Multiple Pain Sites No               TREATMENT: Warm up on Nustep BUE/BLE level 3 x4 min (unbilled), seat #10, arm 9;  Therex:  Stepping up on step +airex x15 reps each LE with 1 rail assist to challenge LE strength with increased stair height;   NMR:  Forward reciprocal stepping over orange hurdle #2 x10 reps each with 1-0 rail assist; required min VCS for increased step length without rail assist;   Side stepping over orange hurdle x10 reps each direction without rail assist, min A for safety;  There Ex:  Standing with left foot on dyna disc balancing: -ball toss and catch x10 reps to challenge strength in RLE with stance -LLE hip hike (sassy lift) x10 reps with mod VCS and visual cues for sequencing;    Seated: Piriformis (figure 4) stretch 60 sec hold x2 reps each LE;  Hamstring stretch 30 sec hold x2 reps each LE;      Education provided throughout session in the form of demonstration, VC/TC in order to facilitate movement at target joints and correct muscle activation with exercises.Patient does exhibit weakness in RLE gluteal muscle having difficulty with RLE stance activities. He denies any increase in pain with advanced exercise.   Patient's condition has the potential to improve in response to therapy. Maximum improvement is yet to be obtained. The anticipated improvement is attainable and reasonable in a generally predictable time.  Patient reports                          PT Education - 11/01/20 1439    Education Details exercise technique    Person(s) Educated Patient    Methods Explanation;Verbal cues    Comprehension Verbalized understanding;Returned demonstration;Verbal cues required;Need further instruction            PT Short Term Goals - 10/25/20 1737       PT SHORT TERM GOAL #1   Title Patient will be independent in home exercise program to improve strength/mobility for better functional independence with ADLs.    Baseline patient is compliant with all his HEP; attending water aerobics on days he's not coming to therapy.    Time 4    Period Weeks    Status Achieved    Target Date 08/03/20      PT SHORT TERM GOAL #2   Title Patient will increase RLE gross strength to 4+/5 as to improve functional strength for independent gait, increased standing tolerance and increased ADL ability.    Baseline 12/21: RLE gross strength 4/5 2/23: see note; 4/11: grossly 5/5 BLEs  Time 4    Period Weeks    Status Achieved    Target Date 08/03/20             PT Long Term Goals - 10/25/20 1737      PT LONG TERM GOAL #1   Title Patient will increase FOTO score to equal to or greater than 67 to demonstrate statistically significant improvement in mobility and quality of life.    Baseline 12/21: 57 2/23: 74%    Time 8    Period Weeks    Status Achieved      PT LONG TERM GOAL #2   Title Patient (> 43 years old) will complete five times sit to stand test in <12 seconds indicating an increased LE strength and improved balance.    Baseline 12/21: 17.65s, 01/26: 14.47s; 3/16: 13.17sec; 4/11: 8.5 sec    Time 8    Period Weeks    Status Achieved      PT LONG TERM GOAL #3   Title Patient will increase six minute walk test distance to >1000 for progression to community ambulator and improve gait ability    Baseline 12/21: 500, 01/26: 655 ft 2/23: 735 ft; 3/16: 679ft; 4/11: 737 ft    Time 6    Period Weeks    Status On-going    Target Date 12/06/20      PT LONG TERM GOAL #4   Title Patient will reduce timed up and go to <11 seconds to reduce fall risk and demonstrate improved transfer/gait ability.    Baseline 12/21: 16.78s, 01/26: 12.08s 2/23: 10 sec    Time 8    Period Weeks    Status Achieved      PT LONG TERM GOAL #5   Title Patient will  increase lower extremity functional scale to >60/80 to demonstrate improved functional mobility and increased tolerance with ADLs.    Baseline 12/21: 50/80, 01/26: 62/80    Time 8    Period Weeks    Status Achieved      Additional Long Term Goals   Additional Long Term Goals Yes                 Plan - 11/02/20 0848    Clinical Impression Statement Patient reports increased RLE hip pain in the morning that decreases throughout the day following exercise and movement. He exhibits midl antalgic gait pattern especially with initial standing. Patient instructed in advanced LE strengthening and balance exercise. He continues to be hesitant to reduce rail assist especially when stepping over obstacles. Patient required CGA to close supervision with most activities. he would benefit from additional skilled PT Intervention to improve strength, balance and mobility;    Personal Factors and Comorbidities Comorbidity 3+;Time since onset of injury/illness/exacerbation    Comorbidities diabetes, hypertension, hyperlipidemia    Examination-Activity Limitations Squat;Stairs;Transfers    Examination-Participation Restrictions [employer]    Stability/Clinical Decision Making Evolving/Moderate complexity    Rehab Potential Fair    PT Frequency 2x / week    PT Duration 6 weeks    PT Treatment/Interventions Moist Heat;Electrical Stimulation;Gait training;Stair training;Functional mobility training;Therapeutic activities;Therapeutic exercise;Balance training;Neuromuscular re-education;Patient/family education;Manual techniques;Passive range of motion;Energy conservation    PT Next Visit Plan Advance multiplanar, multi surface balance training; Right hip abduction, ER strength; assess mm length LEs, continue LE stretching and address back mobility    PT Home Exercise Plan no updates    Consulted and Agree with Plan of Care Patient  Patient will benefit from skilled therapeutic intervention in  order to improve the following deficits and impairments:  Abnormal gait,Decreased endurance,Impaired sensation,Decreased activity tolerance,Decreased strength,Decreased balance,Decreased mobility,Difficulty walking,Decreased range of motion,Decreased safety awareness,Improper body mechanics  Visit Diagnosis: Other abnormalities of gait and mobility  Muscle weakness (generalized)  Pain in right hip  Unsteadiness on feet     Problem List There are no problems to display for this patient.   Chanika Byland PT, DPT 11/02/2020, 8:54 AM  [place] MAIN [facility] SERVICES [address] [place], [place], [address] Phone: [phone]   Fax:  [phone]  Name: Michael Cohen MRN: [id number] Date of Birth: 08-26-1940

## 2020-11-03 ENCOUNTER — Ambulatory Visit: Payer: Medicare Other | Admitting: Physical Therapy

## 2020-11-03 ENCOUNTER — Encounter: Payer: Self-pay | Admitting: Physical Therapy

## 2020-11-03 ENCOUNTER — Other Ambulatory Visit: Payer: Self-pay

## 2020-11-03 DIAGNOSIS — M6281 Muscle weakness (generalized): Secondary | ICD-10-CM

## 2020-11-03 DIAGNOSIS — R2681 Unsteadiness on feet: Secondary | ICD-10-CM

## 2020-11-03 DIAGNOSIS — M25551 Pain in right hip: Secondary | ICD-10-CM

## 2020-11-03 DIAGNOSIS — R2689 Other abnormalities of gait and mobility: Secondary | ICD-10-CM

## 2020-11-03 NOTE — Therapy (Signed)
[place] MAIN [facility] SERVICES [address] [place], [place], [address] Phone: [phone]   Fax:  [phone]  Physical Therapy Treatment  Patient Details  Name: Michael Cohen MRN: [id number] Date of Birth: 1940/07/30 Referring Provider (PT): [name]   Encounter Date: 11/03/2020   PT End of Session - 11/03/20 1434    Visit Number 31    Number of Visits 45    Date for PT Re-Evaluation 10/26/20    Authorization Type Medicare Parts A & B    Authorization Time Period Cert 2/83/66-2/94/76    PT Start Time 1430    PT Stop Time 1515    PT Time Calculation (min) 45 min    Equipment Utilized During Treatment Gait belt    Activity Tolerance Patient tolerated treatment well;No increased pain    Behavior During Therapy WFL for tasks assessed/performed           Past Medical History:  Diagnosis Date  . Diabetes mellitus without complication ([place])   . Hypercholesteremia   . Hypertension     Past Surgical History:  Procedure Laterality Date  . CATARACT EXTRACTION Bilateral   . COLONOSCOPY WITH PROPOFOL N/A 12/12/2017   Procedure: COLONOSCOPY WITH PROPOFOL;  Surgeon: [name], [name], MD;  Location: [facility] ENDOSCOPY;  Service: Gastroenterology;  Laterality: N/A;  . EYE SURGERY      There were no vitals filed for this visit.   Subjective Assessment - 11/03/20 1433    Subjective Pt reports having increased RLE hip pain first thing in the morning about 8/10 and then after he moves around it will diminish to 2/10. He reports having trouble walking initially but then it gets better. He reports no pain currently; He did water aerobics this morning and states that helped alleviate his discomfort;    Pertinent History 80 y.o. 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 His pain began around a month ago and is located over the proximal lateral thigh and in the right buttocks. He  describes his pain as achy more than sharp. He is not sure of any aggravating activities. He reports of having some pain at rest today. He reports associated pain or "tightness" felt along anterior and lateral portions of the lower leg. He was previously evaluated by [name] in podiatry and diagnosed with tibialis tendinitis. He denies associated groin pain, denies radicular symptoms. He has tried Baclofen without any relief of his symptoms. Of note, the patient states that he was scheduled to go on a cruise this week but was canceled due to the pain he is experiencing. He states that he would be unable to walk around and participate fully in his vacation given his pain. An anteroposterior view of the pelvis and anteroposterior and lateral views of the right hip were obtained. Images reveal mild loss of femoral acetabular joint space with mild osteophyte formation noted. CAM deformity of the right hip noted. No fractures or dislocations. Anteroposterior, oblique, and lateral views of the lumbar spine were obtained. Images reveal no scoliosis. Mild loss of lumbar lordosis noted. Significant loss of disc space noted at L4-L5 and L5-S1. Intervertebral osteophyte formation of L4 and L5. No fractures or wedging noted. Calcification of the abdominal aorta noted. Degeneration of the lumbar facets noted. Patient was recently diagnosed with lumbar radiculoapthy due to symptoms being more consistent vs hip pathology given the absence of hip symptoms with provocative maneuvers. Prednisone tapers have  been considered but is on hold for now due to patient's diabetes.    Limitations Walking;Standing;Lifting;Sitting    How long can you sit comfortably? Few hours    How long can you stand comfortably? 30 mins    How long can you walk comfortably? 1 hour    Currently in Pain? No/denies    Pain Onset More than a month ago              TREATMENT: Warm up on Nustep BUE/BLE level 3 x4 min (unbilled), seat #10, arm 9;     NMR:  Forward reciprocal stepping over orange hurdle #2 x10 reps each with 1-0 rail assist; required min VCS for increased step length without rail assist;    Side stepping over orange hurdle x5 reps each direction without rail assist, min A for safety;   Outside parallel bars: -stepping over stick:  Single leg step clap and then step back x5 reps each LE  Single leg step over raising ball overhead looking up and then stepping back  Standing in parallel bars: -forward lunge on BOSU with 1-0 rail assist x10 reps each LE; -standing on BOSU, one foot on 8 inch step (staggered stance) unsupported 10 sec hold x2 reps each LE; patient does exhibit less difficulty when standing on RLE but reports some fatigue in right gluteal region;   Diagonal stepping over 2x4 beam x2 laps with multiple steps required for balance recovery. Required min A for safety; Instructed patient in stepping over with step to pattern but patient had difficulty maintaining balance often requiring extra steps for balance;   There Ex:  -Forward lunge with LLE, RLE hip flexor stretch 20 sec hold x2 reps, minimal pull reported;    Seated: Piriformis (figure 4) stretch 60 sec hold x2 reps each LE;        Education provided throughout session in the form of demonstration, VC/TC in order to facilitate movement at target joints and correct muscle activation with exercises. Patient does exhibit weakness in RLE gluteal muscle having difficulty with RLE stance activities. He reports mild discomfort in right posterior hip following exercise. Patient declined cryotheray; Reinforced figure 4 stretch to help reduce delayed onset muscle soreness;                            PT Education - 11/03/20 1434    Education Details exercise technique, HEP    Person(s) Educated Patient    Methods Explanation;Verbal cues    Comprehension Verbalized understanding;Returned demonstration;Verbal cues required;Need further  instruction            PT Short Term Goals - 10/25/20 1737      PT SHORT TERM GOAL #1   Title Patient will be independent in home exercise program to improve strength/mobility for better functional independence with ADLs.    Baseline patient is compliant with all his HEP; attending water aerobics on days he's not coming to therapy.    Time 4    Period Weeks    Status Achieved    Target Date 08/03/20      PT SHORT TERM GOAL #2   Title Patient will increase RLE gross strength to 4+/5 as to improve functional strength for independent gait, increased standing tolerance and increased ADL ability.    Baseline 12/21: RLE gross strength 4/5 2/23: see note; 4/11: grossly 5/5 BLEs    Time 4    Period Weeks    Status Achieved  Target Date 08/03/20             PT Long Term Goals - 10/25/20 1737      PT LONG TERM GOAL #1   Title Patient will increase FOTO score to equal to or greater than 67 to demonstrate statistically significant improvement in mobility and quality of life.    Baseline 12/21: 57 2/23: 74%    Time 8    Period Weeks    Status Achieved      PT LONG TERM GOAL #2   Title Patient (> 17 years old) will complete five times sit to stand test in <12 seconds indicating an increased LE strength and improved balance.    Baseline 12/21: 17.65s, 01/26: 14.47s; 3/16: 13.17sec; 4/11: 8.5 sec    Time 8    Period Weeks    Status Achieved      PT LONG TERM GOAL #3   Title Patient will increase six minute walk test distance to >1000 for progression to community ambulator and improve gait ability    Baseline 12/21: 500, 01/26: 655 ft 2/23: 735 ft; 3/16: 670ft; 4/11: 737 ft    Time 6    Period Weeks    Status On-going    Target Date 12/06/20      PT LONG TERM GOAL #4   Title Patient will reduce timed up and go to <11 seconds to reduce fall risk and demonstrate improved transfer/gait ability.    Baseline 12/21: 16.78s, 01/26: 12.08s 2/23: 10 sec    Time 8    Period Weeks     Status Achieved      PT LONG TERM GOAL #5   Title Patient will increase lower extremity functional scale to >60/80 to demonstrate improved functional mobility and increased tolerance with ADLs.    Baseline 12/21: 50/80, 01/26: 62/80    Time 8    Period Weeks    Status Achieved      Additional Long Term Goals   Additional Long Term Goals Yes                 Plan - 11/04/20 1011    Clinical Impression Statement Patient motivated and participated well within session. He was instructed in advanced LE strengthening/balance tasks. Patient continues to have weakness in RLE gluteal muscle which limits stance control. He was instructed in advanced balance tasks to facilitate improved obstacle negotiation and reduce fall risk with community activities. Patient does have difficulty shifting weight posteriorly requiring intermittent rail assist. He does report slight increased discomfort in RLE posterior hip at end of session. However he declined cryotherapy. Reinforced importance of figure 4 stretches to help alleviate tightness and reduce delayed onset muscle soreness. Patient verbalized understanding. He would benefit from additional skilled PT Intervention to improve strength, balance and mobility;    Personal Factors and Comorbidities Comorbidity 3+;Time since onset of injury/illness/exacerbation    Comorbidities diabetes, hypertension, hyperlipidemia    Examination-Activity Limitations Squat;Stairs;Transfers    Examination-Participation Restrictions [employer]    Stability/Clinical Decision Making Evolving/Moderate complexity    Rehab Potential Fair    PT Frequency 2x / week    PT Duration 6 weeks    PT Treatment/Interventions Moist Heat;Electrical Stimulation;Gait training;Stair training;Functional mobility training;Therapeutic activities;Therapeutic exercise;Balance training;Neuromuscular re-education;Patient/family education;Manual techniques;Passive range of motion;Energy conservation    PT  Next Visit Plan Advance multiplanar, multi surface balance training; Right hip abduction, ER strength; assess mm length LEs, continue LE stretching and address back mobility    PT Home Exercise  Plan no updates    Consulted and Agree with Plan of Care Patient           Patient will benefit from skilled therapeutic intervention in order to improve the following deficits and impairments:  Abnormal gait,Decreased endurance,Impaired sensation,Decreased activity tolerance,Decreased strength,Decreased balance,Decreased mobility,Difficulty walking,Decreased range of motion,Decreased safety awareness,Improper body mechanics  Visit Diagnosis: Other abnormalities of gait and mobility  Muscle weakness (generalized)  Pain in right hip  Unsteadiness on feet     Problem List There are no problems to display for this patient.   Orlando Thalmann PT, DPT 11/04/2020, 10:13 AM  [place] MAIN [facility] SERVICES [address] [place], [place], [address] Phone: [phone]   Fax:  [phone]  Name: Michael Cohen MRN: [id number] Date of Birth: 1941-03-11

## 2020-11-08 ENCOUNTER — Ambulatory Visit: Payer: Medicare Other | Admitting: Physical Therapy

## 2020-11-10 ENCOUNTER — Other Ambulatory Visit: Payer: Self-pay

## 2020-11-10 ENCOUNTER — Encounter: Payer: Self-pay | Admitting: Physical Therapy

## 2020-11-10 ENCOUNTER — Ambulatory Visit: Payer: Medicare Other | Admitting: Physical Therapy

## 2020-11-10 DIAGNOSIS — R2681 Unsteadiness on feet: Secondary | ICD-10-CM | POA: Diagnosis not present

## 2020-11-10 DIAGNOSIS — M6281 Muscle weakness (generalized): Secondary | ICD-10-CM

## 2020-11-10 DIAGNOSIS — R2689 Other abnormalities of gait and mobility: Secondary | ICD-10-CM

## 2020-11-10 DIAGNOSIS — M25551 Pain in right hip: Secondary | ICD-10-CM

## 2020-11-11 NOTE — Therapy (Signed)
[place] MAIN [facility] SERVICES [address] [place], [place], [address] Phone: [phone]   Fax:  [phone]  Physical Therapy Treatment  Patient Details  Name: Michael Cohen MRN: [id number] Date of Birth: 09-13-1940 Referring Provider (PT): [name]   Encounter Date: 11/10/2020   PT End of Session - 11/11/20 0829    Visit Number 32    Number of Visits 45    Date for PT Re-Evaluation 12/06/20    Authorization Type Medicare Parts A & B    Authorization Time Period Cert 2/84-1/32/44    PT Start Time 1432    PT Stop Time 1515    PT Time Calculation (min) 43 min    Equipment Utilized During Treatment --    Activity Tolerance Patient tolerated treatment well;No increased pain    Behavior During Therapy WFL for tasks assessed/performed           Past Medical History:  Diagnosis Date  . Diabetes mellitus without complication ([place])   . Hypercholesteremia   . Hypertension     Past Surgical History:  Procedure Laterality Date  . CATARACT EXTRACTION Bilateral   . COLONOSCOPY WITH PROPOFOL N/A 12/12/2017   Procedure: COLONOSCOPY WITH PROPOFOL;  Surgeon: [name], [name], MD;  Location: [facility] ENDOSCOPY;  Service: Gastroenterology;  Laterality: N/A;  . EYE SURGERY      There were no vitals filed for this visit.   Subjective Assessment - 11/10/20 1437    Subjective Pt reports increased tightness in right lower leg along ankle and medial calf; He reports this has been going on the last week; He also reports continued right posterior hip discomfort;    Pertinent History 80 y.o. 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 His pain began around a month ago and is located over the proximal lateral thigh and in the right buttocks. He describes his pain as achy more than sharp. He is not sure of any aggravating activities. He reports of having some pain at rest today. He reports  associated pain or "tightness" felt along anterior and lateral portions of the lower leg. He was previously evaluated by [name] in podiatry and diagnosed with tibialis tendinitis. He denies associated groin pain, denies radicular symptoms. He has tried Baclofen without any relief of his symptoms. Of note, the patient states that he was scheduled to go on a cruise this week but was canceled due to the pain he is experiencing. He states that he would be unable to walk around and participate fully in his vacation given his pain. An anteroposterior view of the pelvis and anteroposterior and lateral views of the right hip were obtained. Images reveal mild loss of femoral acetabular joint space with mild osteophyte formation noted. CAM deformity of the right hip noted. No fractures or dislocations. Anteroposterior, oblique, and lateral views of the lumbar spine were obtained. Images reveal no scoliosis. Mild loss of lumbar lordosis noted. Significant loss of disc space noted at L4-L5 and L5-S1. Intervertebral osteophyte formation of L4 and L5. No fractures or wedging noted. Calcification of the abdominal aorta noted. Degeneration of the lumbar facets noted. Patient was recently diagnosed with lumbar radiculoapthy due to symptoms being more consistent vs hip pathology given the absence of hip symptoms with provocative maneuvers. Prednisone tapers have been considered but is on hold for now due to patient's diabetes.    Limitations Walking;Standing;Lifting;Sitting    How long can you  sit comfortably? Few hours    How long can you stand comfortably? 30 mins    How long can you walk comfortably? 1 hour    Currently in Pain? Yes    Pain Score 5     Pain Location Hip    Pain Orientation Right    Pain Descriptors / Indicators Aching;Sore    Pain Onset More than a month ago    Multiple Pain Sites Yes    Pain Score 2    Pain Location Calf    Pain Orientation Right;Medial    Pain Descriptors / Indicators  Aching;Tightness    Pain Type Acute pain    Pain Onset In the past 7 days    Pain Frequency Intermittent    Aggravating Factors  worse with weight bearing/standing; worse in AM    Pain Relieving Factors stretch/heat    Effect of Pain on Daily Activities decreased walking tolerance;             TREATMENT:  Patient instructed in supine position with moist heat to posterior hip for comfort  PT assessed RLE lower leg tightness/discomfort. Identified tightness along posterior tibialis tendon;  PT performed soft tissue massage utilizing edge tool for IASTM with cross friction technique x6 min;   Patient reports slight reduction in tightness but reports increased RLE posterior/lateral hip discomfort with palpation to lower leg;  PT assessed RLE: SLR (+) for radicular symptoms; Patient also reports tightness with passive hamstring neural stretch with hip flexion and knee flexion/extension movement;   PT performed passive piriformis stretch with stretch reported in posterior hip but no discomfort reported along lateral hip;  PT performed long axis distraction 20 sec hold, 20 sec rest x3 min with no resolution of hip discomfort or change in symptoms;   Patient instructed in left sidelying position: PT assessed RLE lateral hip with significant tenderness reported along greater trochanter consistent with possible bursitis or gluteal med tendinopathy  PT utilized rolling stick for soft tissue massage to right posterior/lateral hip x10 min with mild discomfort reported and moderate tenderness along greater trochanter  Instructed patient in RLE isometric clamshell 5 sec hold x5 reps; Patient able to provide moderate muscle activation reporting minimal difficulty or discomfort;  Patient transitioned to hooklying;  Instructed patient in single knee to chest stretch 15 sec hold x1 rep bilaterally;  Patient was supervision transitioning to sitting/standing;  Instructed patient in standing  heel off step calf stretch 20 sec hold x2 reps each LE Pt reports moderate tightness along right calf;  Patient tolerated session fair. He reports continued RLE discomfort at end of session. He reports slight reduction in tightness but states he can still feel discomfort in RLE;  Would benefit from additional skilled intervention for LE flexibility and strengthening;                          PT Education - 11/11/20 0829    Education Details exercise technique/HEP; positioning;    Person(s) Educated Patient    Methods Explanation;Verbal cues    Comprehension Verbalized understanding;Returned demonstration;Verbal cues required;Need further instruction            PT Short Term Goals - 10/25/20 1737      PT SHORT TERM GOAL #1   Title Patient will be independent in home exercise program to improve strength/mobility for better functional independence with ADLs.    Baseline patient is compliant with all his HEP; attending water aerobics on days  he's not coming to therapy.    Time 4    Period Weeks    Status Achieved    Target Date 08/03/20      PT SHORT TERM GOAL #2   Title Patient will increase RLE gross strength to 4+/5 as to improve functional strength for independent gait, increased standing tolerance and increased ADL ability.    Baseline 12/21: RLE gross strength 4/5 2/23: see note; 4/11: grossly 5/5 BLEs    Time 4    Period Weeks    Status Achieved    Target Date 08/03/20             PT Long Term Goals - 10/25/20 1737      PT LONG TERM GOAL #1   Title Patient will increase FOTO score to equal to or greater than 67 to demonstrate statistically significant improvement in mobility and quality of life.    Baseline 12/21: 57 2/23: 74%    Time 8    Period Weeks    Status Achieved      PT LONG TERM GOAL #2   Title Patient (> 58 years old) will complete five times sit to stand test in <12 seconds indicating an increased LE strength and improved balance.     Baseline 12/21: 17.65s, 01/26: 14.47s; 3/16: 13.17sec; 4/11: 8.5 sec    Time 8    Period Weeks    Status Achieved      PT LONG TERM GOAL #3   Title Patient will increase six minute walk test distance to >1000 for progression to community ambulator and improve gait ability    Baseline 12/21: 500, 01/26: 655 ft 2/23: 735 ft; 3/16: 680ft; 4/11: 737 ft    Time 6    Period Weeks    Status On-going    Target Date 12/06/20      PT LONG TERM GOAL #4   Title Patient will reduce timed up and go to <11 seconds to reduce fall risk and demonstrate improved transfer/gait ability.    Baseline 12/21: 16.78s, 01/26: 12.08s 2/23: 10 sec    Time 8    Period Weeks    Status Achieved      PT LONG TERM GOAL #5   Title Patient will increase lower extremity functional scale to >60/80 to demonstrate improved functional mobility and increased tolerance with ADLs.    Baseline 12/21: 50/80, 01/26: 62/80    Time 8    Period Weeks    Status Achieved      Additional Long Term Goals   Additional Long Term Goals Yes                 Plan - 11/11/20 0830    Clinical Impression Statement Patient motivated and participated well within session. He reports acute RLE lower leg discomfort along posterior tib tendon. PT performed soft tissue massage and identified increased tightness along soft tissue. Patient also reports increased RLE posterior/lateral hip discomfort with palpation to lower leg indicating possible nerve impingement. Patient instructed in LE stretches; In addition PT performed soft tissue massage for increased flexibility. Patient tolerated session fair. He reports continued discomfort upon standing but states that the tightness is mildly better. Instructed patient in standing calf stretch for HEP to improve ankle ROM and reduce tightness. Patient verbalized understanding. He would benefit from additional skilled PT Intervention to improve strength, balance and mobility while reducing RLE  discomfort;    Personal Factors and Comorbidities Comorbidity 3+;Time since onset of injury/illness/exacerbation  Comorbidities diabetes, hypertension, hyperlipidemia    Examination-Activity Limitations Squat;Stairs;Transfers    Examination-Participation Restrictions [employer]    Stability/Clinical Decision Making Evolving/Moderate complexity    Rehab Potential Fair    PT Frequency 2x / week    PT Duration 6 weeks    PT Treatment/Interventions Moist Heat;Electrical Stimulation;Gait training;Stair training;Functional mobility training;Therapeutic activities;Therapeutic exercise;Balance training;Neuromuscular re-education;Patient/family education;Manual techniques;Passive range of motion;Energy conservation    PT Next Visit Plan Advance multiplanar, multi surface balance training; Right hip abduction, ER strength; assess mm length LEs, continue LE stretching and address back mobility    PT Home Exercise Plan no updates    Consulted and Agree with Plan of Care Patient           Patient will benefit from skilled therapeutic intervention in order to improve the following deficits and impairments:  Abnormal gait,Decreased endurance,Impaired sensation,Decreased activity tolerance,Decreased strength,Decreased balance,Decreased mobility,Difficulty walking,Decreased range of motion,Decreased safety awareness,Improper body mechanics  Visit Diagnosis: Other abnormalities of gait and mobility  Muscle weakness (generalized)  Pain in right hip  Unsteadiness on feet     Problem List There are no problems to display for this patient.   Baneen Wieseler PT, DPT 11/11/2020, 8:32 AM  [place] MAIN [facility] SERVICES [address] [place], [place], [address] Phone: [phone]   Fax:  [phone]  Name: Michael Cohen MRN: [id number] Date of Birth: 1941-04-13

## 2020-11-15 ENCOUNTER — Other Ambulatory Visit: Payer: Self-pay

## 2020-11-15 ENCOUNTER — Ambulatory Visit: Payer: Medicare Other | Attending: Podiatry

## 2020-11-15 DIAGNOSIS — R2689 Other abnormalities of gait and mobility: Secondary | ICD-10-CM

## 2020-11-15 DIAGNOSIS — R2681 Unsteadiness on feet: Secondary | ICD-10-CM | POA: Diagnosis present

## 2020-11-15 DIAGNOSIS — R269 Unspecified abnormalities of gait and mobility: Secondary | ICD-10-CM | POA: Diagnosis present

## 2020-11-15 DIAGNOSIS — M6281 Muscle weakness (generalized): Secondary | ICD-10-CM

## 2020-11-15 DIAGNOSIS — M25551 Pain in right hip: Secondary | ICD-10-CM | POA: Diagnosis present

## 2020-11-15 NOTE — Therapy (Signed)
[place] MAIN [facility] SERVICES [address] [place], [place], [address] Phone: [phone]   Fax:  [phone]  Physical Therapy Treatment  Patient Details  Name: Michael Cohen MRN: [id number] Date of Birth: 01-04-1941 Referring Provider (PT): [name]   Encounter Date: 11/15/2020   PT End of Session - 11/15/20 1435    Visit Number 33    Number of Visits 45    Date for PT Re-Evaluation 12/06/20    Authorization Type Medicare Parts A & B    Authorization Time Period Cert 4/97-0/26/37    PT Start Time 1421    PT Stop Time 1501    PT Time Calculation (min) 40 min    Equipment Utilized During Treatment Gait belt    Activity Tolerance Patient tolerated treatment well;No increased pain    Behavior During Therapy WFL for tasks assessed/performed           Past Medical History:  Diagnosis Date  . Diabetes mellitus without complication ([place])   . Hypercholesteremia   . Hypertension     Past Surgical History:  Procedure Laterality Date  . CATARACT EXTRACTION Bilateral   . COLONOSCOPY WITH PROPOFOL N/A 12/12/2017   Procedure: COLONOSCOPY WITH PROPOFOL;  Surgeon: [name], [name], MD;  Location: [facility] ENDOSCOPY;  Service: Gastroenterology;  Laterality: N/A;  . EYE SURGERY      There were no vitals filed for this visit.   Subjective Assessment - 11/15/20 1421    Subjective Pt reports a pop on his Right hip last thursday, has not had any pain since then, but now has a limp when he walks that he cannot explain.    Pertinent History 80 y.o. 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 His pain began around a month ago and is located over the proximal lateral thigh and in the right buttocks. He describes his pain as achy more than sharp. He is not sure of any aggravating activities. He reports of having some pain at rest today. He reports associated pain or "tightness" felt along  anterior and lateral portions of the lower leg. He was previously evaluated by [name] in podiatry and diagnosed with tibialis tendinitis. He denies associated groin pain, denies radicular symptoms. He has tried Baclofen without any relief of his symptoms. Of note, the patient states that he was scheduled to go on a cruise this week but was canceled due to the pain he is experiencing. He states that he would be unable to walk around and participate fully in his vacation given his pain. An anteroposterior view of the pelvis and anteroposterior and lateral views of the right hip were obtained. Images reveal mild loss of femoral acetabular joint space with mild osteophyte formation noted. CAM deformity of the right hip noted. No fractures or dislocations. Anteroposterior, oblique, and lateral views of the lumbar spine were obtained. Images reveal no scoliosis. Mild loss of lumbar lordosis noted. Significant loss of disc space noted at L4-L5 and L5-S1. Intervertebral osteophyte formation of L4 and L5. No fractures or wedging noted. Calcification of the abdominal aorta noted. Degeneration of the lumbar facets noted. Patient was recently diagnosed with lumbar radiculoapthy due to symptoms being more consistent vs hip pathology given the absence of hip symptoms with provocative maneuvers. Prednisone tapers have been considered but is on hold for now due to patient's diabetes.    Currently in Pain? No/denies  INTERVENTION THIS DATE: -palpation of right hip: no tenderness along the Rt greater trochanter; abnormal firmness halfway between the greater trochanter and iliac crest almost directly lateral with ~2cm anterior displacement, feel firm and well formed like a muscle knot, less diffuse than a focal effusion (extraarticular), does release like muscle tissue, but then pt has complaint of soreness thereafter.   -Gait assessment, overground, unsupported, heavy end-range trendelenburg maintained ~20  degrees of Right pelvis elevation, pt reports his Right leg feels shorter since the 'pop' on Thursday.  -MMT supine bilat hip ABDCT 4/5 Left, 4-/5 Right, no pain -SLR: mild weakness Left, moderate weakness Right, no pain; excessive adduction c SLR on right   -Hooklying bridge 2x10, cues for max available height (limited by weakness)  -Hooklying GreenTB clam 2x20 bilat -Hooklying SAQ 2x15 c 5lb AW bilat -hooklying marching 2x15 bilat   -standing hip extension 1x15 bilat, BUE support on treadmill rail (poor control of hip extension, unable to isometrically hold at 0 degrees during exercises, uses momentum similar to a pendulum to achieve movement.  -standing hip ABDCT, is able to control isometrically at end range with discomfort on right, unable to fix pelvic lateral drop in stance, feels Right leg is shorter (2x15 bilat)  -unable to perform RLE hip hikes      PT Short Term Goals - 10/25/20 1737      PT SHORT TERM GOAL #1   Title Patient will be independent in home exercise program to improve strength/mobility for better functional independence with ADLs.    Baseline patient is compliant with all his HEP; attending water aerobics on days he's not coming to therapy.    Time 4    Period Weeks    Status Achieved    Target Date 08/03/20      PT SHORT TERM GOAL #2   Title Patient will increase RLE gross strength to 4+/5 as to improve functional strength for independent gait, increased standing tolerance and increased ADL ability.    Baseline 12/21: RLE gross strength 4/5 2/23: see note; 4/11: grossly 5/5 BLEs    Time 4    Period Weeks    Status Achieved    Target Date 08/03/20             PT Long Term Goals - 10/25/20 1737      PT LONG TERM GOAL #1   Title Patient will increase FOTO score to equal to or greater than 67 to demonstrate statistically significant improvement in mobility and quality of life.    Baseline 12/21: 57 2/23: 74%    Time 8    Period Weeks    Status Achieved       PT LONG TERM GOAL #2   Title Patient (> 28 years old) will complete five times sit to stand test in <12 seconds indicating an increased LE strength and improved balance.    Baseline 12/21: 17.65s, 01/26: 14.47s; 3/16: 13.17sec; 4/11: 8.5 sec    Time 8    Period Weeks    Status Achieved      PT LONG TERM GOAL #3   Title Patient will increase six minute walk test distance to >1000 for progression to community ambulator and improve gait ability    Baseline 12/21: 500, 01/26: 655 ft 2/23: 735 ft; 3/16: 667ft; 4/11: 737 ft    Time 6    Period Weeks    Status On-going    Target Date 12/06/20      PT LONG TERM GOAL #4  Title Patient will reduce timed up and go to <11 seconds to reduce fall risk and demonstrate improved transfer/gait ability.    Baseline 12/21: 16.78s, 01/26: 12.08s 2/23: 10 sec    Time 8    Period Weeks    Status Achieved      PT LONG TERM GOAL #5   Title Patient will increase lower extremity functional scale to >60/80 to demonstrate improved functional mobility and increased tolerance with ADLs.    Baseline 12/21: 50/80, 01/26: 62/80    Time 8    Period Weeks    Status Achieved      Additional Long Term Goals   Additional Long Term Goals Yes                 Plan - 11/15/20 1442    Clinical Impression Statement Continued with current plan of care as laid out in evaluation and recent prior sessions. Pt remains motivated to advance progress toward goals. Rest breaks provided as needed, pt quick to ask when needed. Took special time to screen Right hip this date after complaint of recent 'pop' with immediate relief of pain and immediate onset of limp heretofore. Pt does require varying levels of assistance and cuing for completion of exercises for correct form and sometimes due to pain/weakness. Pt has new severe onset trendelenburg gait since last Thursday without explanation, may consider referral to orthopedics if persists. No updates to HEP this date.     Personal Factors and Comorbidities Comorbidity 3+;Time since onset of injury/illness/exacerbation    Comorbidities diabetes, hypertension, hyperlipidemia    Examination-Activity Limitations Squat;Stairs;Transfers    Examination-Participation Restrictions [employer]    Stability/Clinical Decision Making Evolving/Moderate complexity    Clinical Decision Making Moderate    Rehab Potential Fair    PT Frequency 2x / week    PT Duration 6 weeks    PT Treatment/Interventions Moist Heat;Electrical Stimulation;Gait training;Stair training;Functional mobility training;Therapeutic activities;Therapeutic exercise;Balance training;Neuromuscular re-education;Patient/family education;Manual techniques;Passive range of motion;Energy conservation    PT Next Visit Plan Advance multiplanar, multi surface balance training; Right hip abduction, ER strength; assess mm length LEs, continue LE stretching and address back mobility    PT Home Exercise Plan no updates    Consulted and Agree with Plan of Care Patient           Patient will benefit from skilled therapeutic intervention in order to improve the following deficits and impairments:  Abnormal gait,Decreased endurance,Impaired sensation,Decreased activity tolerance,Decreased strength,Decreased balance,Decreased mobility,Difficulty walking,Decreased range of motion,Decreased safety awareness,Improper body mechanics  Visit Diagnosis: Other abnormalities of gait and mobility  Muscle weakness (generalized)  Pain in right hip  Unsteadiness on feet  Gait abnormality     Problem List There are no problems to display for this patient.  4:13 PM, 11/15/20 Etta Grandchild, PT, DPT Physical Therapist - [facility]  Outpatient Physical Therapy- [place] [phone]     Etta Grandchild 11/15/2020, 4:01 PM  [place] MAIN [facility] SERVICES [address] [place], [place],  [address] Phone: [phone]   Fax:  [phone]  Name: Michael Cohen MRN: [id number] Date of Birth: [birth year]

## 2020-11-17 ENCOUNTER — Encounter: Payer: Self-pay | Admitting: Physical Therapy

## 2020-11-17 ENCOUNTER — Other Ambulatory Visit: Payer: Self-pay

## 2020-11-17 ENCOUNTER — Ambulatory Visit: Payer: Medicare Other | Admitting: Physical Therapy

## 2020-11-17 DIAGNOSIS — M25551 Pain in right hip: Secondary | ICD-10-CM

## 2020-11-17 DIAGNOSIS — R2689 Other abnormalities of gait and mobility: Secondary | ICD-10-CM

## 2020-11-17 DIAGNOSIS — R2681 Unsteadiness on feet: Secondary | ICD-10-CM

## 2020-11-17 DIAGNOSIS — M6281 Muscle weakness (generalized): Secondary | ICD-10-CM

## 2020-11-17 NOTE — Therapy (Addendum)
[place] MAIN [facility] SERVICES [address] [place], [place], [address] Phone: [phone]   Fax:  [phone]  Physical Therapy Treatment  Patient Details  Name: Michael Cohen MRN: [id number] Date of Birth: 04-02-1941 Referring Provider (PT): [name]   Encounter Date: 11/17/2020   PT End of Session - 11/17/20 1410    Visit Number 34    Number of Visits 45    Date for PT Re-Evaluation 12/06/20    Authorization Type Medicare Parts A & B    Authorization Time Period Cert 4/08-1/44/81    PT Start Time 1345    PT Stop Time 1415    PT Time Calculation (min) 30 min    Equipment Utilized During Treatment Gait belt    Activity Tolerance Patient tolerated treatment well;No increased pain    Behavior During Therapy WFL for tasks assessed/performed           Past Medical History:  Diagnosis Date  . Diabetes mellitus without complication ([place])   . Hypercholesteremia   . Hypertension     Past Surgical History:  Procedure Laterality Date  . CATARACT EXTRACTION Bilateral   . COLONOSCOPY WITH PROPOFOL N/A 12/12/2017   Procedure: COLONOSCOPY WITH PROPOFOL;  Surgeon: [name], [name], MD;  Location: [facility] ENDOSCOPY;  Service: Gastroenterology;  Laterality: N/A;  . EYE SURGERY      There were no vitals filed for this visit.   Subjective Assessment - 11/17/20 1410    Subjective Pt reports a pop on his Right hip last thursday, has not had any pain since then, but now has a limp when he walks that he cannot explain.    Pertinent History 80 y.o. 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 His pain began around a month ago and is located over the proximal lateral thigh and in the right buttocks. He describes his pain as achy more than sharp. He is not sure of any aggravating activities. He reports of having some pain at rest today. He reports associated pain or "tightness" felt along  anterior and lateral portions of the lower leg. He was previously evaluated by [name] in podiatry and diagnosed with tibialis tendinitis. He denies associated groin pain, denies radicular symptoms. He has tried Baclofen without any relief of his symptoms. Of note, the patient states that he was scheduled to go on a cruise this week but was canceled due to the pain he is experiencing. He states that he would be unable to walk around and participate fully in his vacation given his pain. An anteroposterior view of the pelvis and anteroposterior and lateral views of the right hip were obtained. Images reveal mild loss of femoral acetabular joint space with mild osteophyte formation noted. CAM deformity of the right hip noted. No fractures or dislocations. Anteroposterior, oblique, and lateral views of the lumbar spine were obtained. Images reveal no scoliosis. Mild loss of lumbar lordosis noted. Significant loss of disc space noted at L4-L5 and L5-S1. Intervertebral osteophyte formation of L4 and L5. No fractures or wedging noted. Calcification of the abdominal aorta noted. Degeneration of the lumbar facets noted. Patient was recently diagnosed with lumbar radiculoapthy due to symptoms being more consistent vs hip pathology given the absence of hip symptoms with provocative maneuvers. Prednisone tapers have been considered but is on hold for now due to patient's diabetes.    Currently in Pain? No/denies  PT assessed RLE: Patient exhibits significant trendlenburg gait on RLE; He denies any pain with RLE Single leg stance but is unable to hold weight on RLE with poor gluteal stabilization;  When walking patient exhibits significant trendelenburg with significantly slower gait speed. Pt reports with prolonged walking he walks even slower and feels very unsteady;   PT assessed RLE gluteal strength: 3-/5  Patient denies any pain with resisted gluteal activation however minimal muscle activation  noted with heavy accessory muscle activation to achieve hip abduction.   Patient does exhibit some extra tissue/swelling along right greater trochanter which is minimal tender. He does report increased tenderness with increased palpation but states, "its just sore."   Pt has a history of gluteal tendinopathy.  Due to symptoms of new onset pop, trendelenburg gait and resolution of hip pain but new onset of swelling/muscle bulge around right greater trochanter a referral to orthopedic physician is appropriate for possible gluteal rupture.  Will reach out to PCP for referral. Will hold PT until patient gets assessed for safety;                           PT Short Term Goals - 10/25/20 1737      PT SHORT TERM GOAL #1   Title Patient will be independent in home exercise program to improve strength/mobility for better functional independence with ADLs.    Baseline patient is compliant with all his HEP; attending water aerobics on days he's not coming to therapy.    Time 4    Period Weeks    Status Achieved    Target Date 08/03/20      PT SHORT TERM GOAL #2   Title Patient will increase RLE gross strength to 4+/5 as to improve functional strength for independent gait, increased standing tolerance and increased ADL ability.    Baseline 12/21: RLE gross strength 4/5 2/23: see note; 4/11: grossly 5/5 BLEs    Time 4    Period Weeks    Status Achieved    Target Date 08/03/20             PT Long Term Goals - 10/25/20 1737      PT LONG TERM GOAL #1   Title Patient will increase FOTO score to equal to or greater than 67 to demonstrate statistically significant improvement in mobility and quality of life.    Baseline 12/21: 57 2/23: 74%    Time 8    Period Weeks    Status Achieved      PT LONG TERM GOAL #2   Title Patient (> 58 years old) will complete five times sit to stand test in <12 seconds indicating an increased LE strength and improved balance.    Baseline  12/21: 17.65s, 01/26: 14.47s; 3/16: 13.17sec; 4/11: 8.5 sec    Time 8    Period Weeks    Status Achieved      PT LONG TERM GOAL #3   Title Patient will increase six minute walk test distance to >1000 for progression to community ambulator and improve gait ability    Baseline 12/21: 500, 01/26: 655 ft 2/23: 735 ft; 3/16: 617ft; 4/11: 737 ft    Time 6    Period Weeks    Status On-going    Target Date 12/06/20      PT LONG TERM GOAL #4   Title Patient will reduce timed up and go to <11 seconds to reduce fall risk and demonstrate  improved transfer/gait ability.    Baseline 12/21: 16.78s, 01/26: 12.08s 2/23: 10 sec    Time 8    Period Weeks    Status Achieved      PT LONG TERM GOAL #5   Title Patient will increase lower extremity functional scale to >60/80 to demonstrate improved functional mobility and increased tolerance with ADLs.    Baseline 12/21: 50/80, 01/26: 62/80    Time 8    Period Weeks    Status Achieved      Additional Long Term Goals   Additional Long Term Goals Yes                 Plan - 11/17/20 1410    Clinical Impression Statement Patient presents to therapy with new onset trendelenburg gait and no pain in right hip. He exhibits minimal gluteal activation with hip abduction and tenderness with repeated palpation along right greater trochanter. Concerned about possible gluteal tendon tear due to new onset trendelenburg and change in symptoms. Patient would benefit from referral to orthopedic for further assessment. Will contact MD for referral.    Personal Factors and Comorbidities Comorbidity 3+;Time since onset of injury/illness/exacerbation    Comorbidities diabetes, hypertension, hyperlipidemia    Examination-Activity Limitations Squat;Stairs;Transfers    Examination-Participation Restrictions [employer]    Stability/Clinical Decision Making Evolving/Moderate complexity    Rehab Potential Fair    PT Frequency 2x / week    PT Duration 6 weeks    PT  Treatment/Interventions Moist Heat;Electrical Stimulation;Gait training;Stair training;Functional mobility training;Therapeutic activities;Therapeutic exercise;Balance training;Neuromuscular re-education;Patient/family education;Manual techniques;Passive range of motion;Energy conservation    PT Next Visit Plan Advance multiplanar, multi surface balance training; Right hip abduction, ER strength; assess mm length LEs, continue LE stretching and address back mobility    PT Home Exercise Plan no updates    Consulted and Agree with Plan of Care Patient           Patient will benefit from skilled therapeutic intervention in order to improve the following deficits and impairments:  Abnormal gait,Decreased endurance,Impaired sensation,Decreased activity tolerance,Decreased strength,Decreased balance,Decreased mobility,Difficulty walking,Decreased range of motion,Decreased safety awareness,Improper body mechanics  Visit Diagnosis: Other abnormalities of gait and mobility  Muscle weakness (generalized)  Pain in right hip  Unsteadiness on feet     Problem List There are no problems to display for this patient.   Anntonette Madewell PT, DPT 11/17/2020, 2:13 PM  [place] MAIN [facility] SERVICES [address] [place], [place], [address] Phone: [phone]   Fax:  [phone]  Name: Michael Cohen MRN: [id number] Date of Birth: February 13, 1941

## 2020-11-22 ENCOUNTER — Ambulatory Visit: Payer: Medicare Other | Admitting: Physical Therapy

## 2020-11-22 ENCOUNTER — Other Ambulatory Visit: Payer: Self-pay

## 2020-11-24 ENCOUNTER — Ambulatory Visit: Payer: Medicare Other | Admitting: Physical Therapy

## 2020-11-26 ENCOUNTER — Other Ambulatory Visit: Payer: Self-pay | Admitting: Orthopedic Surgery

## 2020-11-26 ENCOUNTER — Other Ambulatory Visit (HOSPITAL_COMMUNITY): Payer: Self-pay | Admitting: Orthopedic Surgery

## 2020-11-26 DIAGNOSIS — S76011A Strain of muscle, fascia and tendon of right hip, initial encounter: Secondary | ICD-10-CM

## 2020-11-29 ENCOUNTER — Ambulatory Visit: Payer: Medicare Other | Admitting: Physical Therapy

## 2020-12-01 ENCOUNTER — Ambulatory Visit
Admission: RE | Admit: 2020-12-01 | Discharge: 2020-12-01 | Disposition: A | Discharge status: Home / Self Care | Payer: Medicare Other | Source: Ambulatory Visit | Attending: Orthopedic Surgery | Admitting: Orthopedic Surgery

## 2020-12-01 ENCOUNTER — Other Ambulatory Visit: Payer: Self-pay

## 2020-12-01 ENCOUNTER — Ambulatory Visit: Payer: Medicare Other | Admitting: Physical Therapy

## 2020-12-01 DIAGNOSIS — S76011A Strain of muscle, fascia and tendon of right hip, initial encounter: Secondary | ICD-10-CM | POA: Insufficient documentation

## 2020-12-01 IMAGING — MR MR HIP*R* W/O CM
4 of 5 series · 26 of 40 positions shown · non-contrast
Comparison: Report from prior radiographs of [DATE]

CLINICAL DATA: Chronic right hip pain. Popping injury of the right
hip 2.5 weeks ago, limp.

EXAM:
MR OF THE RIGHT HIP WITHOUT CONTRAST
TECHNIQUE: Multiplanar, multisequence MR imaging was performed. No intravenous
contrast was administered.

[Series 2: T1 · coronal · right · 4.0mm · 0.59mm/px · 5 of 38 slices shown]
[im 1/38]
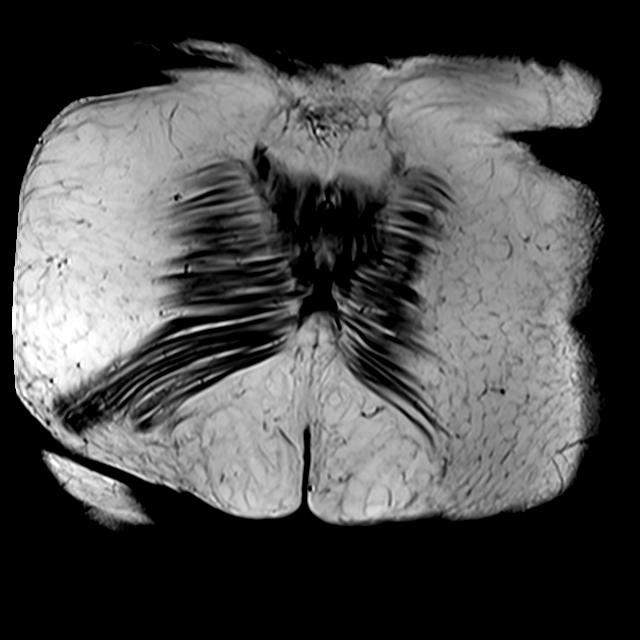
[im 5/38]
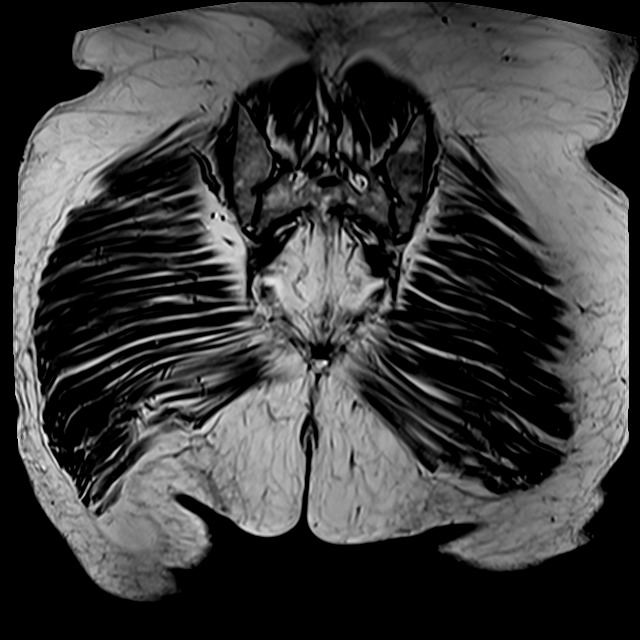
[im 13/38]
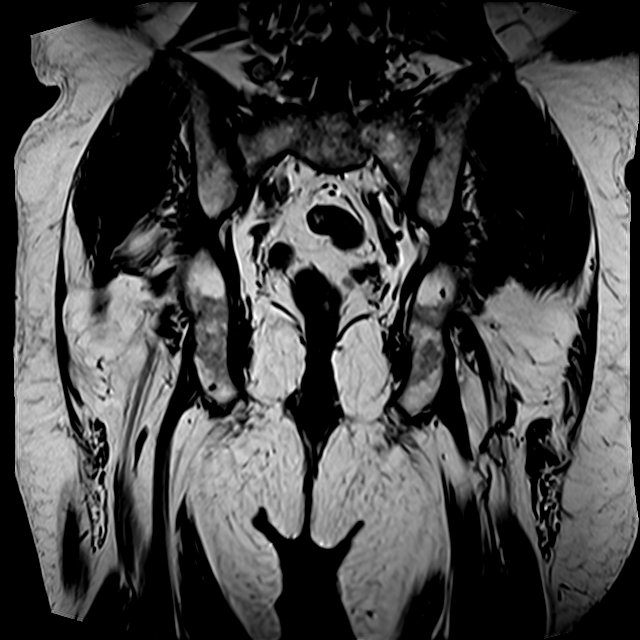
[im 21/38]
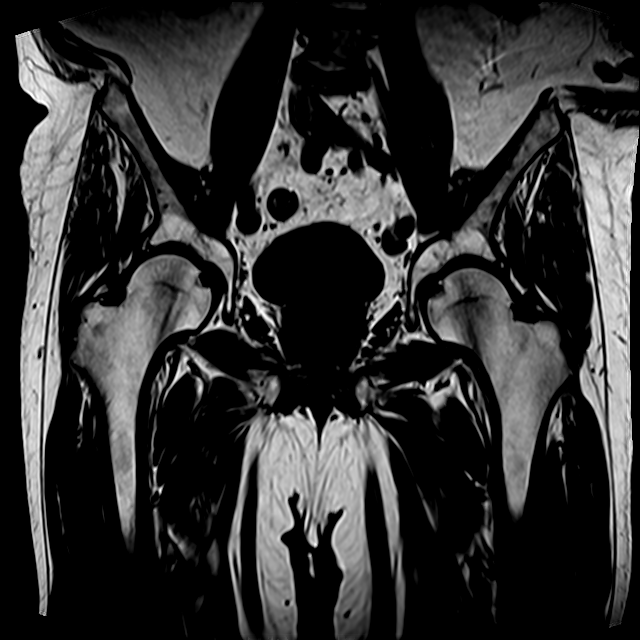
[im 33/38]
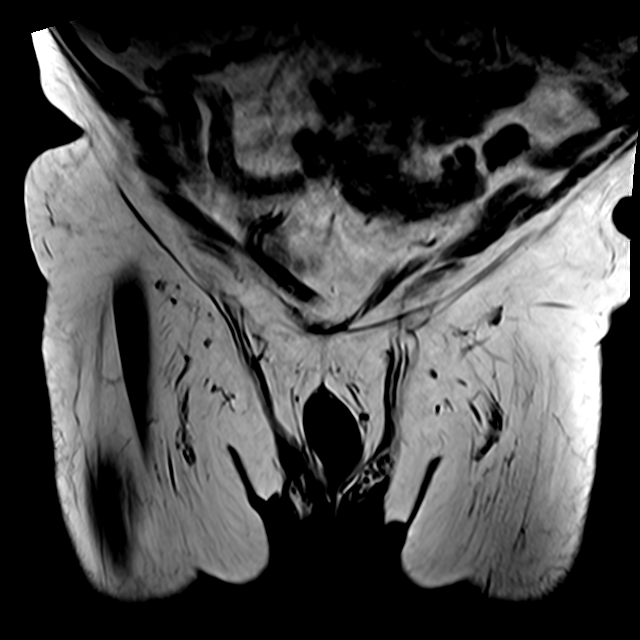

[Series 3: T2 fat-sat · axial · right · 4.0mm · 0.70mm/px · z∈[-73,+72]mm · 7 of 30 slices shown]
[im 1/30]
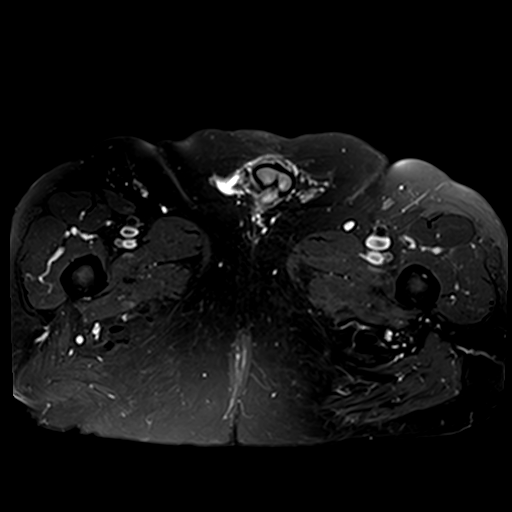
[im 5/30]
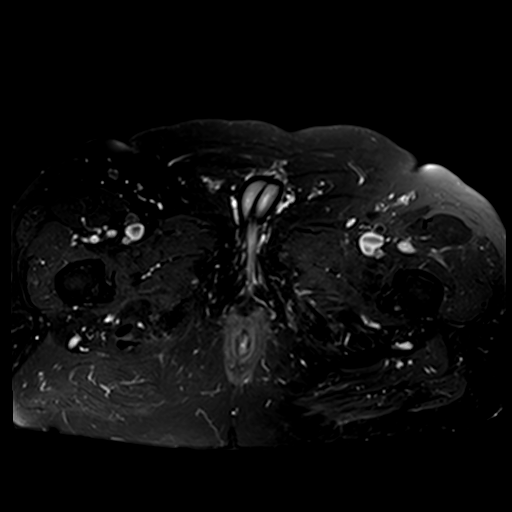
[im 10/30]
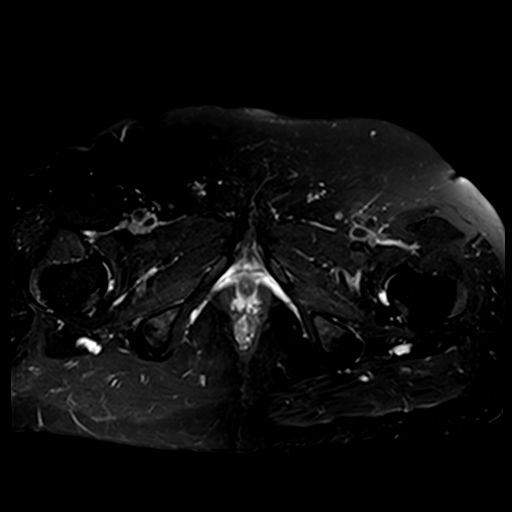
[im 15/30]
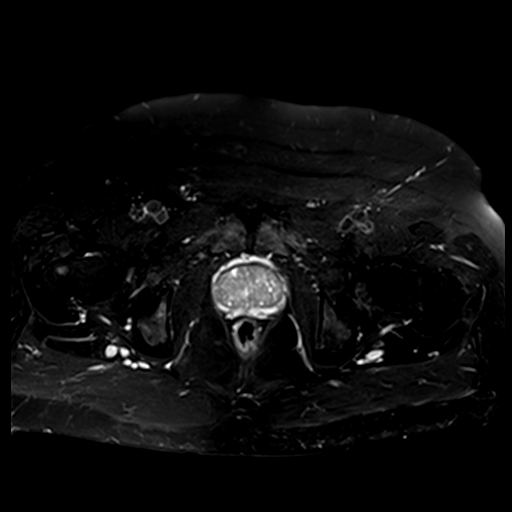
[im 20/30]
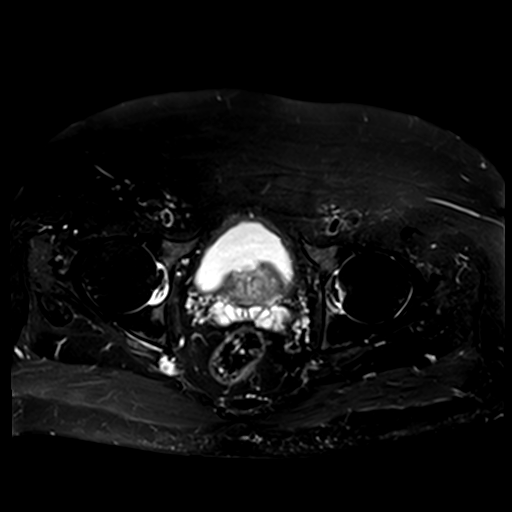
[im 25/30]
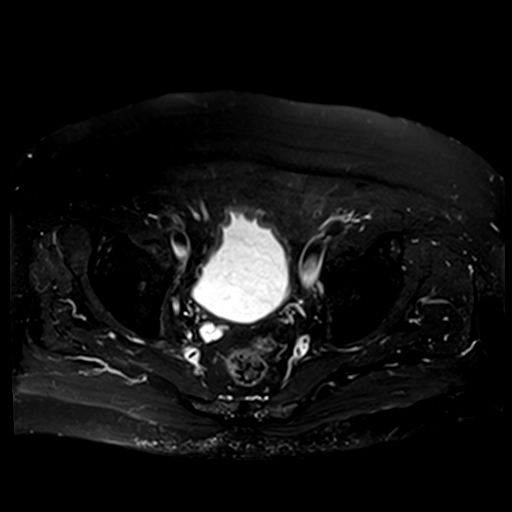
[im 30/30]
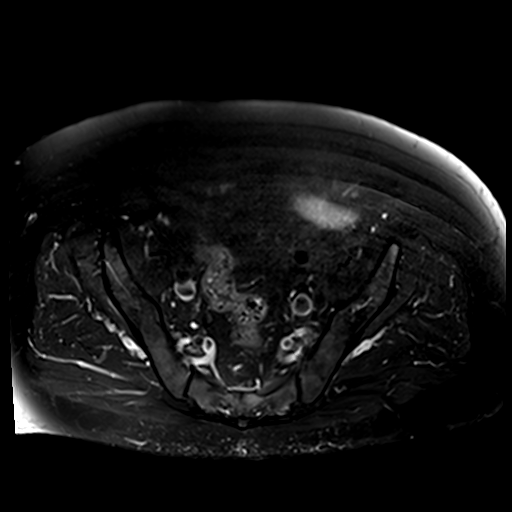

[Series 5: PD fat-sat · sagittal · right · 4.0mm · 0.70mm/px · 7 of 29 slices shown (1 of 2)]
[im 1/29]
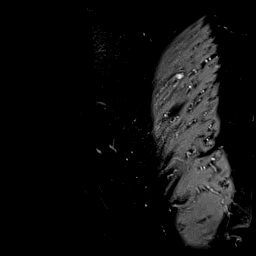
[im 5/29]
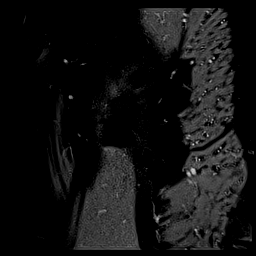
[im 10/29]
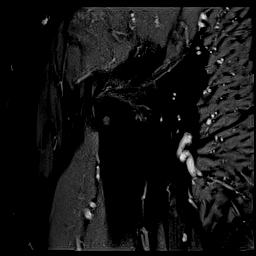
[im 15/29]
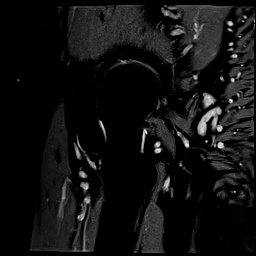
[im 19/29]
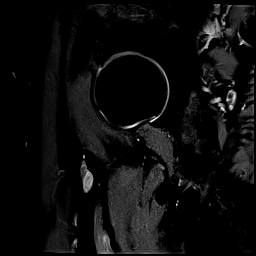
[im 24/29]
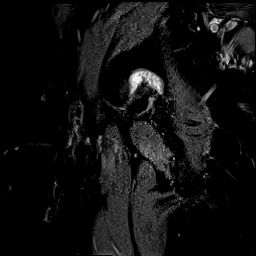
[im 29/29]
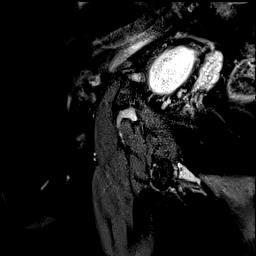

[Series 6: PD fat-sat · coronal · right · 4.0mm · 0.70mm/px · 7 of 29 slices shown (2 of 2)]
[im 1/29]
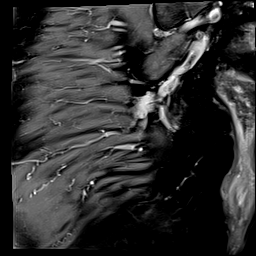
[im 5/29]
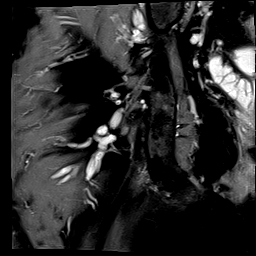
[im 10/29]
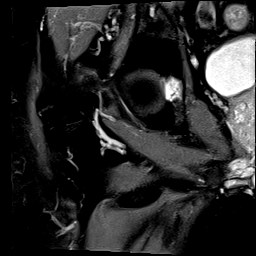
[im 15/29]
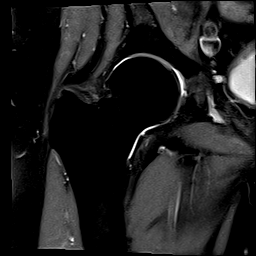
[im 19/29]
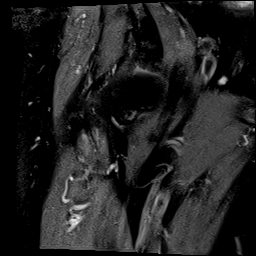
[im 24/29]
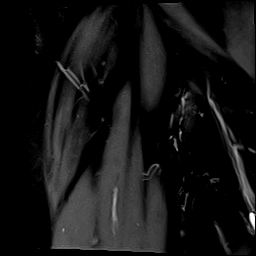
[im 29/29]
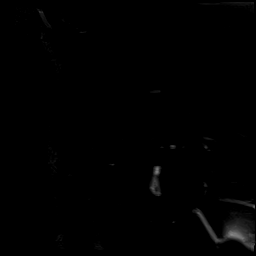

[26 of 40 positions shown; findings below may reference images not displayed]

FINDINGS: Bones: Lower lumbar spondylosis and degenerative disc disease with
disc desiccation, loss of disc height, and spurring. Mild spurring
of the acetabula and femoral heads bilaterally.

Articular cartilage and labrum

Articular cartilage: No focal defect identified. No loose body in
the joint identified.

Labrum:  Grossly intact

Joint or bursal effusion

Joint effusion:  Absent

Bursae: No regional bursitis

Muscles and tendons

Muscles and tendons:  Unremarkable

Other findings

Miscellaneous:   Sigmoid colon diverticulosis.
IMPRESSION: 1. Mild symmetric degenerative spurring of both femoral heads and
acetabula. A specific cause for the patient's recent hip snap and
hip pain is not otherwise identified.
2. Sigmoid colon diverticulosis.

## 2020-12-06 ENCOUNTER — Other Ambulatory Visit: Payer: Self-pay

## 2020-12-06 ENCOUNTER — Encounter: Payer: Self-pay | Admitting: Physical Therapy

## 2020-12-06 ENCOUNTER — Ambulatory Visit: Payer: Medicare Other | Admitting: Physical Therapy

## 2020-12-06 DIAGNOSIS — R2689 Other abnormalities of gait and mobility: Secondary | ICD-10-CM | POA: Diagnosis not present

## 2020-12-06 DIAGNOSIS — M25551 Pain in right hip: Secondary | ICD-10-CM

## 2020-12-06 DIAGNOSIS — M6281 Muscle weakness (generalized): Secondary | ICD-10-CM

## 2020-12-06 DIAGNOSIS — R2681 Unsteadiness on feet: Secondary | ICD-10-CM

## 2020-12-06 NOTE — Therapy (Signed)
[place] MAIN [facility] SERVICES [address] [place], [place], [address] Phone: [phone]   Fax:  [phone]  Physical Therapy Treatment  Patient Details  Name: Michael Cohen MRN: [id number] Date of Birth: 1940/11/05 Referring Provider (PT): [name]   Encounter Date: 12/06/2020   PT End of Session - 12/06/20 1439    Visit Number 35    Number of Visits 61    Date for PT Re-Evaluation 02/01/21    Authorization Type Medicare Parts A & B    Authorization Time Period Cert 2/70-6/23    PT Start Time 1430    PT Stop Time 1515    PT Time Calculation (min) 45 min    Equipment Utilized During Treatment Gait belt    Activity Tolerance Patient tolerated treatment well;No increased pain    Behavior During Therapy WFL for tasks assessed/performed           Past Medical History:  Diagnosis Date  . Diabetes mellitus without complication ([place])   . Hypercholesteremia   . Hypertension     Past Surgical History:  Procedure Laterality Date  . CATARACT EXTRACTION Bilateral   . COLONOSCOPY WITH PROPOFOL N/A 12/12/2017   Procedure: COLONOSCOPY WITH PROPOFOL;  Surgeon: [name], [name], MD;  Location: [facility] ENDOSCOPY;  Service: Gastroenterology;  Laterality: N/A;  . EYE SURGERY      There were no vitals filed for this visit.   Subjective Assessment - 12/06/20 1437    Subjective Pt had MRI last week and results were unremarkable. PA is going to follow up with radiologist as patient is still having trendelenburg gait but no pain in hip;    Pertinent History 80 y.o. 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 His pain began around a month ago and is located over the proximal lateral thigh and in the right buttocks. He describes his pain as achy more than sharp. He is not sure of any aggravating activities. He reports of having some pain at rest today. He reports associated pain or  "tightness" felt along anterior and lateral portions of the lower leg. He was previously evaluated by [name] in podiatry and diagnosed with tibialis tendinitis. He denies associated groin pain, denies radicular symptoms. He has tried Baclofen without any relief of his symptoms. Of note, the patient states that he was scheduled to go on a cruise this week but was canceled due to the pain he is experiencing. He states that he would be unable to walk around and participate fully in his vacation given his pain. An anteroposterior view of the pelvis and anteroposterior and lateral views of the right hip were obtained. Images reveal mild loss of femoral acetabular joint space with mild osteophyte formation noted. CAM deformity of the right hip noted. No fractures or dislocations. Anteroposterior, oblique, and lateral views of the lumbar spine were obtained. Images reveal no scoliosis. Mild loss of lumbar lordosis noted. Significant loss of disc space noted at L4-L5 and L5-S1. Intervertebral osteophyte formation of L4 and L5. No fractures or wedging noted. Calcification of the abdominal aorta noted. Degeneration of the lumbar facets noted. Patient was recently diagnosed with lumbar radiculoapthy due to symptoms being more consistent vs hip pathology given the absence of hip symptoms with provocative maneuvers. Prednisone tapers have been considered but is on hold for now due to patient's diabetes.    Currently in Pain? No/denies    Multiple Pain Sites  No               TREATMENT: Patient had MRI of hip which was unremarkable for any injury. MRI showed femoral head spurring but otherwise unremarkable. Patient continues to have Trendelenburg gait with unsteady walking;  Instructed patient in LE strengthening:  Patient hooklying: Lumbar trunk rotation x1 min with cues to stay in pain free ROM Heel slides x15 reps each LE SLR hip flexion x15 reps each LE with min VCs for proper positioning to avoid painful  ROM  Sidelying: Hip abduction clamshells isometric hold with therapist resistance 5 sec hold x10 reps x2 sets;  Following exercise, had patient walk with increased trendelenburg noted, likely from fatigue from exercise.    Instructed patient in outcome measures to address progress towards goals: Timed up and go, 5 times sit<>Stand, FOTO, etc, see below in goals  Patient has shown a decline in mobility likely from new onset trendelenburg and weakness in right hip. Will continue to work on LE strengthening to improve mobility and increase independence in ADLs.                   PT Education - 12/06/20 1439    Education Details exercise technique/HEP/positioning;    Person(s) Educated Patient    Methods Explanation;Verbal cues    Comprehension Verbalized understanding;Returned demonstration;Verbal cues required;Need further instruction            PT Short Term Goals - 12/06/20 1501      PT SHORT TERM GOAL #1   Title Patient will be independent in home exercise program to improve strength/mobility for better functional independence with ADLs.    Baseline patient is compliant with all his HEP; attending water aerobics on days he's not coming to therapy.    Time 4    Period Weeks    Status Achieved    Target Date 08/03/20      PT SHORT TERM GOAL #2   Title Patient will increase RLE gross strength to 4+/5 as to improve functional strength for independent gait, increased standing tolerance and increased ADL ability.    Baseline 12/21: RLE gross strength 4/5 2/23: see note; 4/11: grossly 5/5 BLEs    Time 4    Period Weeks    Status On-going    Target Date 08/03/20             PT Long Term Goals - 12/06/20 1502      PT LONG TERM GOAL #1   Title Patient will increase FOTO score to equal to or greater than 67 to demonstrate statistically significant improvement in mobility and quality of life.    Baseline 12/21: 57 2/23: 74%, 5/23: 55%    Time 8    Period Weeks     Status Not Met    Target Date 02/01/21      PT LONG TERM GOAL #2   Title Patient (> 47 years old) will complete five times sit to stand test in <12 seconds indicating an increased LE strength and improved balance.    Baseline 12/21: 17.65s, 01/26: 14.47s; 3/16: 13.17sec; 4/11: 8.5 sec, 5/23: 15.25 sec    Time 8    Period Weeks    Status Not Met    Target Date 02/01/21      PT LONG TERM GOAL #3   Title Patient will increase six minute walk test distance to >1000 for progression to community ambulator and improve gait ability    Baseline 12/21: 500, 01/26: 655  ft 2/23: 735 ft; 3/16: 612f; 4/11: 737 ft    Time 8    Period Weeks    Status Deferred    Target Date 02/01/21      PT LONG TERM GOAL #4   Title Patient will reduce timed up and go to <11 seconds to reduce fall risk and demonstrate improved transfer/gait ability.    Baseline 12/21: 16.78s, 01/26: 12.08s 2/23: 10 sec, 5/23: 10 sec    Time 8    Period Weeks    Status Achieved    Target Date 02/01/21      PT LONG TERM GOAL #5   Title Patient will increase lower extremity functional scale to >60/80 to demonstrate improved functional mobility and increased tolerance with ADLs.    Baseline 12/21: 50/80, 01/26: 62/80    Time 8    Period Weeks    Status Achieved    Target Date 02/01/21                 Plan - 12/07/20 1054    Clinical Impression Statement Patient presents to therapy with continued trendelenburg gait, minimal to no right hip pain. He did get MRI which was unremarkable except for femoral head spurring. PA is going to follow up with radiologist for confirmation. He was instructed in LE strengthening. Patient does exhibit increased trendelenburg gait with increased fatigue after exercise. He continues to have minimal to no pain. Patient instructed in outcome measures to address progress towards goals. Due to recent change in status with trendelenburg gait and new onset weakness he does exhibit a decline. He  would benefit from additional skilled PT intervention to improve strength, balance and mobility;    Personal Factors and Comorbidities Comorbidity 3+;Time since onset of injury/illness/exacerbation    Comorbidities diabetes, hypertension, hyperlipidemia    Examination-Activity Limitations Squat;Stairs;Transfers    Examination-Participation Restrictions [place]    Stability/Clinical Decision Making Evolving/Moderate complexity    Rehab Potential Fair    PT Frequency 2x / week    PT Duration 8 weeks    PT Treatment/Interventions Moist Heat;Electrical Stimulation;Gait training;Stair training;Functional mobility training;Therapeutic activities;Therapeutic exercise;Balance training;Neuromuscular re-education;Patient/family education;Manual techniques;Passive range of motion;Energy conservation    PT Next Visit Plan Advance multiplanar, multi surface balance training; Right hip abduction, ER strength; assess mm length LEs, continue LE stretching and address back mobility    PT Home Exercise Plan no updates    Consulted and Agree with Plan of Care Patient           Patient will benefit from skilled therapeutic intervention in order to improve the following deficits and impairments:  Abnormal gait,Decreased endurance,Impaired sensation,Decreased activity tolerance,Decreased strength,Decreased balance,Decreased mobility,Difficulty walking,Decreased range of motion,Decreased safety awareness,Improper body mechanics  Visit Diagnosis: Other abnormalities of gait and mobility  Muscle weakness (generalized)  Pain in right hip  Unsteadiness on feet     Problem List There are no problems to display for this patient.   Meenakshi Sazama PT, DPT 12/07/2020, 11:13 AM  [place] [facility] [address] [address]Phone: [phone]  Fax:  [phone] Name: Michael BonanoMRN: [id number] of Birth: [birth year]

## 2020-12-08 ENCOUNTER — Encounter: Payer: Self-pay | Admitting: Physical Therapy

## 2020-12-08 ENCOUNTER — Ambulatory Visit: Payer: Medicare Other | Admitting: Physical Therapy

## 2020-12-08 ENCOUNTER — Other Ambulatory Visit: Payer: Self-pay

## 2020-12-08 DIAGNOSIS — M25551 Pain in right hip: Secondary | ICD-10-CM

## 2020-12-08 DIAGNOSIS — R2689 Other abnormalities of gait and mobility: Secondary | ICD-10-CM | POA: Diagnosis not present

## 2020-12-08 DIAGNOSIS — M6281 Muscle weakness (generalized): Secondary | ICD-10-CM

## 2020-12-08 DIAGNOSIS — R2681 Unsteadiness on feet: Secondary | ICD-10-CM

## 2020-12-08 NOTE — Therapy (Signed)
[place] MAIN [facility] SERVICES [address] [place], [place], [address] Phone: [phone]   Fax:  [phone]  Physical Therapy Treatment  Patient Details  Name: Michael Cohen MRN: [id number] Date of Birth: 10-Jul-1941 Referring Provider (PT): [name]   Encounter Date: 12/08/2020   PT End of Session - 12/08/20 1449    Visit Number 36    Number of Visits 61    Date for PT Re-Evaluation 02/01/21    Authorization Type Medicare Parts A & B    Authorization Time Period Cert 7/62-2/63    PT Start Time 1432    PT Stop Time 1515    PT Time Calculation (min) 43 min    Equipment Utilized During Treatment Gait belt    Activity Tolerance Patient tolerated treatment well;No increased pain    Behavior During Therapy WFL for tasks assessed/performed           Past Medical History:  Diagnosis Date  . Diabetes mellitus without complication ([place])   . Hypercholesteremia   . Hypertension     Past Surgical History:  Procedure Laterality Date  . CATARACT EXTRACTION Bilateral   . COLONOSCOPY WITH PROPOFOL N/A 12/12/2017   Procedure: COLONOSCOPY WITH PROPOFOL;  Surgeon: [name], [name], MD;  Location: [facility] ENDOSCOPY;  Service: Gastroenterology;  Laterality: N/A;  . EYE SURGERY      There were no vitals filed for this visit.   Subjective Assessment - 12/08/20 1440    Subjective Pt reports doing well. No new complaints.Denies any hip pain;    Pertinent History 80 y.o. 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 His pain began around a month ago and is located over the proximal lateral thigh and in the right buttocks. He describes his pain as achy more than sharp. He is not sure of any aggravating activities. He reports of having some pain at rest today. He reports associated pain or "tightness" felt along anterior and lateral portions of the lower leg. He was previously evaluated by Dr.  [name] in podiatry and diagnosed with tibialis tendinitis. He denies associated groin pain, denies radicular symptoms. He has tried Baclofen without any relief of his symptoms. Of note, the patient states that he was scheduled to go on a cruise this week but was canceled due to the pain he is experiencing. He states that he would be unable to walk around and participate fully in his vacation given his pain. An anteroposterior view of the pelvis and anteroposterior and lateral views of the right hip were obtained. Images reveal mild loss of femoral acetabular joint space with mild osteophyte formation noted. CAM deformity of the right hip noted. No fractures or dislocations. Anteroposterior, oblique, and lateral views of the lumbar spine were obtained. Images reveal no scoliosis. Mild loss of lumbar lordosis noted. Significant loss of disc space noted at L4-L5 and L5-S1. Intervertebral osteophyte formation of L4 and L5. No fractures or wedging noted. Calcification of the abdominal aorta noted. Degeneration of the lumbar facets noted. Patient was recently diagnosed with lumbar radiculoapthy due to symptoms being more consistent vs hip pathology given the absence of hip symptoms with provocative maneuvers. Prednisone tapers have been considered but is on hold for now due to patient's diabetes.                   TREATMENT: Patient hooklying: Lumbar trunk rotation x1 min with cues to stay in pain  free ROM SLR hip flexion x15 reps each LE with min VCs for proper positioning to avoid painful ROM Bridges with arms by side 2x15 with min VCs to increase gluteal squeeze. Denies any back pain;  Hip abduction/ER green tband x20 reps with fatigue reported in posterior right hip;   Sidelying: Hip abduction clamshells isometric hold with therapist resistance 5 sec hold x10 reps x2 sets;  Seated piriformis figure 4 stretch 30 sec hold x2 reps bilaterally, with increased tightness reported on right  side  Standing hip flexion march green tband x15  Reps, LLE with weight bearing on RLE to facilitate better gluteal strengthening in RLE. Patient reports increased fatigue when standing with less rail assist and lifting against tband;   Patient tolerated session fair. He does report fatigue at end of session. Although does not exhibit any increase in trendelenburg gait as compared to start of session. He denies any increase in pain;                    PT Education - 12/08/20 1449    Education Details LE strengthening, exercise technique/HEP    Person(s) Educated Patient    Methods Explanation;Verbal cues    Comprehension Verbalized understanding;Returned demonstration;Verbal cues required;Need further instruction            PT Short Term Goals - 12/06/20 1501      PT [place] #1   Title Patient will be independent in home exercise program to improve strength/mobility for better functional independence with ADLs.    Baseline patient is compliant with all his HEP; attending water aerobics on days he's not coming to therapy.    Time 4    Period Weeks    Status Achieved    Target Date 08/03/20      PT SHORT TERM GOAL #2   Title Patient will increase RLE gross strength to 4+/5 as to improve functional strength for independent gait, increased standing tolerance and increased ADL ability.    Baseline 12/21: RLE gross strength 4/5 2/23: see note; 4/11: grossly 5/5 BLEs    Time 4    Period Weeks    Status On-going    Target Date 08/03/20             PT Long Term Goals - 12/06/20 1502      PT LONG TERM GOAL #1   Title Patient will increase FOTO score to equal to or greater than 67 to demonstrate statistically significant improvement in mobility and quality of life.    Baseline 12/21: 57 2/23: 74%, 5/23: 55%    Time 8    Period Weeks    Status Not Met    Target Date 02/01/21      PT LONG TERM GOAL #2   Title Patient (> 39 years old) will complete five  times sit to stand test in <12 seconds indicating an increased LE strength and improved balance.    Baseline 12/21: 17.65s, 01/26: 14.47s; 3/16: 13.17sec; 4/11: 8.5 sec, 5/23: 15.25 sec    Time 8    Period Weeks    Status Not Met    Target Date 02/01/21      PT LONG TERM GOAL #3   Title Patient will increase six minute walk test distance to >1000 for progression to community ambulator and improve gait ability    Baseline 12/21: 500, 01/26: 655 ft 2/23: 735 ft; 3/16: 622f; 4/11: 737 ft    Time 8    Period  Weeks    Status Deferred    Target Date 02/01/21      PT LONG TERM GOAL #4   Title Patient will reduce timed up and go to <11 seconds to reduce fall risk and demonstrate improved transfer/gait ability.    Baseline 12/21: 16.78s, 01/26: 12.08s 2/23: 10 sec, 5/23: 10 sec    Time 8    Period Weeks    Status Achieved    Target Date 02/01/21      PT LONG TERM GOAL #5   Title Patient will increase lower extremity functional scale to >60/80 to demonstrate improved functional mobility and increased tolerance with ADLs.    Baseline 12/21: 50/80, 01/26: 62/80    Time 8    Period Weeks    Status Achieved    Target Date 02/01/21                 Plan - 12/08/20 1526    Clinical Impression Statement Patient motivated and participated well within session. Instructed patient in LE strengthening. He does require min VCS for proper positioning/exercise technique. He does have difficulty wtih gluteal strengthening exercise. Patient also reports increased stiffness with piriformis stretching. He reports fatigue at end of session. He would benefit from additional skilled PT Intervention to improve strength and mobility while reducing hip discomfort. Patient is going on vacation next week, will resume therapy in early June 2022.    Personal Factors and Comorbidities Comorbidity 3+;Time since onset of injury/illness/exacerbation    Comorbidities diabetes, hypertension, hyperlipidemia     Examination-Activity Limitations Squat;Stairs;Transfers    Examination-Participation Restrictions [place]    Stability/Clinical Decision Making Evolving/Moderate complexity    Rehab Potential Fair    PT Frequency 2x / week    PT Duration 8 weeks    PT Treatment/Interventions Moist Heat;Electrical Stimulation;Gait training;Stair training;Functional mobility training;Therapeutic activities;Therapeutic exercise;Balance training;Neuromuscular re-education;Patient/family education;Manual techniques;Passive range of motion;Energy conservation    PT Next Visit Plan Advance multiplanar, multi surface balance training; Right hip abduction, ER strength; assess mm length LEs, continue LE stretching and address back mobility    PT Home Exercise Plan no updates    Consulted and Agree with Plan of Care Patient           Patient will benefit from skilled therapeutic intervention in order to improve the following deficits and impairments:  Abnormal gait,Decreased endurance,Impaired sensation,Decreased activity tolerance,Decreased strength,Decreased balance,Decreased mobility,Difficulty walking,Decreased range of motion,Decreased safety awareness,Improper body mechanics  Visit Diagnosis: Other abnormalities of gait and mobility  Muscle weakness (generalized)  Pain in right hip  Unsteadiness on feet     Problem List There are no problems to display for this patient.   Rakia Frayne PT, DPT 12/09/2020, 8:11 AM  [place] MAIN [facility] SERVICES [address] [place], [place], [address] Phone: [phone]   Fax:  [phone]  Name: Michael Cohen MRN: [id number] Date of Birth: [birth year]

## 2020-12-15 ENCOUNTER — Ambulatory Visit: Payer: Medicare Other | Admitting: Physical Therapy

## 2020-12-20 ENCOUNTER — Encounter: Payer: Self-pay | Admitting: Physical Therapy

## 2020-12-20 ENCOUNTER — Other Ambulatory Visit: Payer: Self-pay

## 2020-12-20 ENCOUNTER — Ambulatory Visit: Payer: Medicare Other | Attending: Podiatry | Admitting: Physical Therapy

## 2020-12-20 DIAGNOSIS — R269 Unspecified abnormalities of gait and mobility: Secondary | ICD-10-CM | POA: Diagnosis present

## 2020-12-20 DIAGNOSIS — R2681 Unsteadiness on feet: Secondary | ICD-10-CM | POA: Diagnosis present

## 2020-12-20 DIAGNOSIS — R2689 Other abnormalities of gait and mobility: Secondary | ICD-10-CM

## 2020-12-20 DIAGNOSIS — M25551 Pain in right hip: Secondary | ICD-10-CM | POA: Diagnosis present

## 2020-12-20 DIAGNOSIS — M6281 Muscle weakness (generalized): Secondary | ICD-10-CM | POA: Diagnosis present

## 2020-12-20 NOTE — Therapy (Signed)
[place] MAIN [facility] SERVICES [address] [place], [place], [address] Phone: [phone]   Fax:  [phone]  Physical Therapy Treatment  Patient Details  Name: Michael Cohen MRN: [id number] Date of Birth: Jun 21, 1941 Referring Provider (PT): [name]   Encounter Date: 12/20/2020   PT End of Session - 12/20/20 1436    Visit Number [id number]    Number of Visits 61    Date for PT Re-Evaluation 02/01/21    Authorization Type Medicare Parts A & B    Authorization Time Period Cert 4/16-3/84    PT Start Time 1432    PT Stop Time 1515    PT Time Calculation (min) 43 min    Equipment Utilized During Treatment Gait belt    Activity Tolerance Patient tolerated treatment well;No increased pain    Behavior During Therapy WFL for tasks assessed/performed           Past Medical History:  Diagnosis Date  . Diabetes mellitus without complication ([place])   . Hypercholesteremia   . Hypertension     Past Surgical History:  Procedure Laterality Date  . CATARACT EXTRACTION Bilateral   . COLONOSCOPY WITH PROPOFOL N/A 12/12/2017   Procedure: COLONOSCOPY WITH PROPOFOL;  Surgeon: [name], [name], MD;  Location: [facility] ENDOSCOPY;  Service: Gastroenterology;  Laterality: N/A;  . EYE SURGERY      There were no vitals filed for this visit.   Subjective Assessment - 12/20/20 1433    Subjective Pt reports continued limp when walking. Denies any hip pain; Denies any numbness. Still has tightness in right lower ankle. Reports stiffness when he first wakes up.    Pertinent History 80 y.o. 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 His pain began around a month ago and is located over the proximal lateral thigh and in the right buttocks. He describes his pain as achy more than sharp. He is not sure of any aggravating activities. He reports of having some pain at rest today. He reports associated pain or  "tightness" felt along anterior and lateral portions of the lower leg. He was previously evaluated by [name] in podiatry and diagnosed with tibialis tendinitis. He denies associated groin pain, denies radicular symptoms. He has tried Baclofen without any relief of his symptoms. Of note, the patient states that he was scheduled to go on a cruise this week but was canceled due to the pain he is experiencing. He states that he would be unable to walk around and participate fully in his vacation given his pain. An anteroposterior view of the pelvis and anteroposterior and lateral views of the right hip were obtained. Images reveal mild loss of femoral acetabular joint space with mild osteophyte formation noted. CAM deformity of the right hip noted. No fractures or dislocations. Anteroposterior, oblique, and lateral views of the lumbar spine were obtained. Images reveal no scoliosis. Mild loss of lumbar lordosis noted. Significant loss of disc space noted at L4-L5 and L5-S1. Intervertebral osteophyte formation of L4 and L5. No fractures or wedging noted. Calcification of the abdominal aorta noted. Degeneration of the lumbar facets noted. Patient was recently diagnosed with lumbar radiculoapthy due to symptoms being more consistent vs hip pathology given the absence of hip symptoms with provocative maneuvers. Prednisone tapers have been considered but is on hold for now due to patient's diabetes.    Currently in Pain? No/denies    Multiple Pain Sites No  TREATMENT: Patient hooklying: -measured leg length, BLE 95 cm each  Lumbar trunk rotation x1 min x2 sets with cues to increase ROM to tolerance;  Bridges with arms by side x15 reps; Pt reports mild ache   Single knee to chest stretch 15 sec hold x2 reps bilaterally;  Sidelying: -hip abduction SLR x10 reps with min VCS for proper positioning -clamshells with green tband x10 reps;   Patient reports slight discomfort in hip during hip  abduction and has difficulty achieving full ROM due to weakness.   Patient instructed in hooklying: Single knee to chest stretch 20 sec hold x2 reps bilaterally;  PT instructed patient in standing to assess hip height. In standing, right hip is higher than left hip however this seems functional as he has shifted most weight to RLE.   Educated patient in lateral weight shifts to reorient to midline with good tolerance; Patient instructed to increase step length and increase weight shift with gait for better mechanics. He ambulated 50 feet with less trendelenburg and better even cadence with cues for increased step length.  Patient tolerated well. He does report fatigue and slight soreness at end of session. Would benefit from additional skilled intervention to increase strength, balance and mobility;                        PT Education - 12/20/20 1435    Education Details LE strengthening, exercise/HEP    Person(s) Educated Patient    Methods Explanation;Verbal cues    Comprehension Verbalized understanding;Returned demonstration;Verbal cues required;Need further instruction            PT Short Term Goals - 12/06/20 1501      PT SHORT TERM GOAL #1   Title Patient will be independent in home exercise program to improve strength/mobility for better functional independence with ADLs.    Baseline patient is compliant with all his HEP; attending water aerobics on days he's not coming to therapy.    Time 4    Period Weeks    Status Achieved    Target Date 08/03/20      PT SHORT TERM GOAL #2   Title Patient will increase RLE gross strength to 4+/5 as to improve functional strength for independent gait, increased standing tolerance and increased ADL ability.    Baseline 12/21: RLE gross strength 4/5 2/23: see note; 4/11: grossly 5/5 BLEs    Time 4    Period Weeks    Status On-going    Target Date 08/03/20             PT Long Term Goals - 12/06/20 1502      PT  LONG TERM GOAL #1   Title Patient will increase FOTO score to equal to or greater than 67 to demonstrate statistically significant improvement in mobility and quality of life.    Baseline 12/21: 57 2/23: 74%, 5/23: 55%    Time 8    Period Weeks    Status Not Met    Target Date 02/01/21      PT LONG TERM GOAL #2   Title Patient (> 55 years old) will complete five times sit to stand test in <12 seconds indicating an increased LE strength and improved balance.    Baseline 12/21: 17.65s, 01/26: 14.47s; 3/16: 13.17sec; 4/11: 8.5 sec, 5/23: 15.25 sec    Time 8    Period Weeks    Status Not Met    Target Date 02/01/21  PT LONG TERM GOAL #3   Title Patient will increase six minute walk test distance to >1000 for progression to community ambulator and improve gait ability    Baseline 12/21: 500, 01/26: 655 ft 2/23: 735 ft; 3/16: 618f; 4/11: 737 ft    Time 8    Period Weeks    Status Deferred    Target Date 02/01/21      PT LONG TERM GOAL #4   Title Patient will reduce timed up and go to <11 seconds to reduce fall risk and demonstrate improved transfer/gait ability.    Baseline 12/21: 16.78s, 01/26: 12.08s 2/23: 10 sec, 5/23: 10 sec    Time 8    Period Weeks    Status Achieved    Target Date 02/01/21      PT LONG TERM GOAL #5   Title Patient will increase lower extremity functional scale to >60/80 to demonstrate improved functional mobility and increased tolerance with ADLs.    Baseline 12/21: 50/80, 01/26: 62/80    Time 8    Period Weeks    Status Achieved    Target Date 02/01/21                 Plan - 12/21/20 1449    Clinical Impression Statement Patient motivated and participated well within session. He continues to have weakness in hip abductors which limits stance control. PT measured LE leg length which was equal bilaterally; In standing, patient often shifts to RLE which gives appearance of trendelenburg. PT instructed patient in neutral weight shift and also  instructed patient to increase step length which did improve gait mechanics with less trendelenburg gait and less uneven cadence. He would benefit from additional skilled PT Intervention to improve strength, balance and mobility;    Personal Factors and Comorbidities Comorbidity 3+;Time since onset of injury/illness/exacerbation    Comorbidities diabetes, hypertension, hyperlipidemia    Examination-Activity Limitations Squat;Stairs;Transfers    Examination-Participation Restrictions [place]    Stability/Clinical Decision Making Evolving/Moderate complexity    Rehab Potential Fair    PT Frequency 2x / week    PT Duration 8 weeks    PT Treatment/Interventions Moist Heat;Electrical Stimulation;Gait training;Stair training;Functional mobility training;Therapeutic activities;Therapeutic exercise;Balance training;Neuromuscular re-education;Patient/family education;Manual techniques;Passive range of motion;Energy conservation    PT Next Visit Plan Advance multiplanar, multi surface balance training; Right hip abduction, ER strength; assess mm length LEs, continue LE stretching and address back mobility    PT Home Exercise Plan no updates    Consulted and Agree with Plan of Care Patient           Patient will benefit from skilled therapeutic intervention in order to improve the following deficits and impairments:  Abnormal gait,Decreased endurance,Impaired sensation,Decreased activity tolerance,Decreased strength,Decreased balance,Decreased mobility,Difficulty walking,Decreased range of motion,Decreased safety awareness,Improper body mechanics  Visit Diagnosis: Other abnormalities of gait and mobility  Muscle weakness (generalized)  Pain in right hip  Unsteadiness on feet  Gait abnormality     Problem List There are no problems to display for this patient.   Felicha Frayne PT, DPT 12/21/2020, 2:54 PM  [place] [facility] [address] [place] [address]Phone: [phone]  Fax:  [phone] Name: Michael LowdermilkMRN: [id number] of Birth: 8Oct 30, 1942

## 2020-12-22 ENCOUNTER — Ambulatory Visit: Payer: Medicare Other | Admitting: Physical Therapy

## 2020-12-22 ENCOUNTER — Other Ambulatory Visit: Payer: Self-pay

## 2020-12-22 ENCOUNTER — Ambulatory Visit: Payer: Medicare Other

## 2020-12-22 DIAGNOSIS — M6281 Muscle weakness (generalized): Secondary | ICD-10-CM

## 2020-12-22 DIAGNOSIS — M25551 Pain in right hip: Secondary | ICD-10-CM

## 2020-12-22 DIAGNOSIS — R2689 Other abnormalities of gait and mobility: Secondary | ICD-10-CM | POA: Diagnosis not present

## 2020-12-22 NOTE — Therapy (Signed)
[place] MAIN [facility] SERVICES [address] [place], [place], [address] Phone: [phone]   Fax:  [phone]  Physical Therapy Treatment  Patient Details  Name: Michael Cohen MRN: [id number] Date of Birth: 11-13-1940 Referring Provider (PT): [name]   Encounter Date: 12/22/2020   PT End of Session - 12/22/20 1657    Visit Number 38    Number of Visits 61    Date for PT Re-Evaluation 02/01/21    Authorization Type Medicare Parts A & B    Authorization Time Period Cert 4/65-0/35    PT Start Time 1430    PT Stop Time 1514    PT Time Calculation (min) 44 min    Equipment Utilized During Treatment Gait belt    Activity Tolerance Patient tolerated treatment well;No increased pain    Behavior During Therapy WFL for tasks assessed/performed           Past Medical History:  Diagnosis Date  . Diabetes mellitus without complication ([place])   . Hypercholesteremia   . Hypertension     Past Surgical History:  Procedure Laterality Date  . CATARACT EXTRACTION Bilateral   . COLONOSCOPY WITH PROPOFOL N/A 12/12/2017   Procedure: COLONOSCOPY WITH PROPOFOL;  Surgeon: [name], [name], MD;  Location: [facility] ENDOSCOPY;  Service: Gastroenterology;  Laterality: N/A;  . EYE SURGERY      There were no vitals filed for this visit.   Subjective Assessment - 12/22/20 1655    Subjective Patient reports he still has his limp but is improving. Has been practicing walking with equal step length with wife's instructions.    Pertinent History 80 y.o. 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 His pain began around a month ago and is located over the proximal lateral thigh and in the right buttocks. He describes his pain as achy more than sharp. He is not sure of any aggravating activities. He reports of having some pain at rest today. He reports associated pain or "tightness" felt along anterior and  lateral portions of the lower leg. He was previously evaluated by [name] in podiatry and diagnosed with tibialis tendinitis. He denies associated groin pain, denies radicular symptoms. He has tried Baclofen without any relief of his symptoms. Of note, the patient states that he was scheduled to go on a cruise this week but was canceled due to the pain he is experiencing. He states that he would be unable to walk around and participate fully in his vacation given his pain. An anteroposterior view of the pelvis and anteroposterior and lateral views of the right hip were obtained. Images reveal mild loss of femoral acetabular joint space with mild osteophyte formation noted. CAM deformity of the right hip noted. No fractures or dislocations. Anteroposterior, oblique, and lateral views of the lumbar spine were obtained. Images reveal no scoliosis. Mild loss of lumbar lordosis noted. Significant loss of disc space noted at L4-L5 and L5-S1. Intervertebral osteophyte formation of L4 and L5. No fractures or wedging noted. Calcification of the abdominal aorta noted. Degeneration of the lumbar facets noted. Patient was recently diagnosed with lumbar radiculoapthy due to symptoms being more consistent vs hip pathology given the absence of hip symptoms with provocative maneuvers. Prednisone tapers have been considered but is on hold for now due to patient's diabetes.    Currently in Pain? No/denies  TREATMENT: Patient hooklying Lumbar trunk rotation x1 min x2 sets with cues to increase ROM to tolerance;   Bridges with arms by side x15 reps; second set abducting into GTB around knees for 15x   Single knee to chest stretch 15 sec hold x2 reps bilaterally;  Modified figure four stretch with PT assistance 60 seconds x 2 trials    Sidelying: -clamshells with green tband x15 reps;  -reverse clamshells 10x;very challenging for patient  Standing:  -step over theraband with clap 10x each LE; no  UE support; challenging with LLE -speed ladder: one foot each square for equal step length and weight shift x 6 lengths of ladder -speed ladder: lateral stepping one foot each square; very challenging for patient to perform; especially with stepping to the right. x4 lengths  -backwards stepping in // bars ; x 4 trials of // bars cues for glute contraction with backwards stepping -hedgehog taps: forward, lateral, backward with UE support x8 trials each LE -modified single limb stance for weight shift onto RLE 30 seconds no UE support; patient reports fatigue.  Seated: GTB abduction 10x with cues for gluteal activation     Pt educated throughout session about proper posture and technique with exercises. Improved exercise technique, movement at target joints, use of target muscles after min to mod verbal, visual, tactile cues.                         PT Education - 12/22/20 1656    Education Details LE strengthening/ body mechanics/ step length    Person(s) Educated Patient    Methods Explanation;Demonstration;Tactile cues;Verbal cues    Comprehension Verbalized understanding;Returned demonstration;Verbal cues required;Tactile cues required            PT Short Term Goals - 12/06/20 1501      PT SHORT TERM GOAL #1   Title Patient will be independent in home exercise program to improve strength/mobility for better functional independence with ADLs.    Baseline patient is compliant with all his HEP; attending water aerobics on days he's not coming to therapy.    Time 4    Period Weeks    Status Achieved    Target Date 08/03/20      PT SHORT TERM GOAL #2   Title Patient will increase RLE gross strength to 4+/5 as to improve functional strength for independent gait, increased standing tolerance and increased ADL ability.    Baseline 12/21: RLE gross strength 4/5 2/23: see note; 4/11: grossly 5/5 BLEs    Time 4    Period Weeks    Status On-going    Target Date  08/03/20             PT Long Term Goals - 12/06/20 1502      PT LONG TERM GOAL #1   Title Patient will increase FOTO score to equal to or greater than 67 to demonstrate statistically significant improvement in mobility and quality of life.    Baseline 12/21: 57 2/23: 74%, 5/23: 55%    Time 8    Period Weeks    Status Not Met    Target Date 02/01/21      PT LONG TERM GOAL #2   Title Patient (> 56 years old) will complete five times sit to stand test in <12 seconds indicating an increased LE strength and improved balance.    Baseline 12/21: 17.65s, 01/26: 14.47s; 3/16: 13.17sec; 4/11: 8.5 sec, 5/23: 15.25 sec  Time 8    Period Weeks    Status Not Met    Target Date 02/01/21      PT LONG TERM GOAL #3   Title Patient will increase six minute walk test distance to >1000 for progression to community ambulator and improve gait ability    Baseline 12/21: 500, 01/26: 655 ft 2/23: 735 ft; 3/16: 648f; 4/11: 737 ft    Time 8    Period Weeks    Status Deferred    Target Date 02/01/21      PT LONG TERM GOAL #4   Title Patient will reduce timed up and go to <11 seconds to reduce fall risk and demonstrate improved transfer/gait ability.    Baseline 12/21: 16.78s, 01/26: 12.08s 2/23: 10 sec, 5/23: 10 sec    Time 8    Period Weeks    Status Achieved    Target Date 02/01/21      PT LONG TERM GOAL #5   Title Patient will increase lower extremity functional scale to >60/80 to demonstrate improved functional mobility and increased tolerance with ADLs.    Baseline 12/21: 50/80, 01/26: 62/80    Time 8    Period Weeks    Status Achieved    Target Date 02/01/21                 Plan - 12/22/20 1712    Clinical Impression Statement Patient is highly motivated throughout physical therapy. Posterior chain activation is improving however patient does have noticeable deficits in lateral stepping and single limb stance. He does require increased visual and verbal cueing for step length  to increase equal step length. He would benefit from additional skilled PT Intervention to improve strength, balance and mobility    Personal Factors and Comorbidities Comorbidity 3+;Time since onset of injury/illness/exacerbation    Comorbidities diabetes, hypertension, hyperlipidemia    Examination-Activity Limitations Squat;Stairs;Transfers    Examination-Participation Restrictions [place]    Stability/Clinical Decision Making Evolving/Moderate complexity    Rehab Potential Fair    PT Frequency 2x / week    PT Duration 8 weeks    PT Treatment/Interventions Moist Heat;Electrical Stimulation;Gait training;Stair training;Functional mobility training;Therapeutic activities;Therapeutic exercise;Balance training;Neuromuscular re-education;Patient/family education;Manual techniques;Passive range of motion;Energy conservation    PT Next Visit Plan Advance multiplanar, multi surface balance training; Right hip abduction, ER strength; assess mm length LEs, continue LE stretching and address back mobility    PT Home Exercise Plan no updates    Consulted and Agree with Plan of Care Patient           Patient will benefit from skilled therapeutic intervention in order to improve the following deficits and impairments:  Abnormal gait,Decreased endurance,Impaired sensation,Decreased activity tolerance,Decreased strength,Decreased balance,Decreased mobility,Difficulty walking,Decreased range of motion,Decreased safety awareness,Improper body mechanics  Visit Diagnosis: Other abnormalities of gait and mobility  Muscle weakness (generalized)  Pain in right hip     Problem List There are no problems to display for this patient.  MJanna Arch PT, DPT   12/22/2020, 5:13 PM  [place] [facility]ERV[facility] [address]orns[address] [place] [address]Phone: [phone]  Fax:  [phone] Name: WYichen GilardiMRN: [id number] of Birth: 802-05-1941

## 2020-12-27 ENCOUNTER — Ambulatory Visit: Payer: Medicare Other | Admitting: Physical Therapy

## 2020-12-27 ENCOUNTER — Encounter: Payer: Self-pay | Admitting: Physical Therapy

## 2020-12-27 ENCOUNTER — Other Ambulatory Visit: Payer: Self-pay

## 2020-12-27 DIAGNOSIS — M6281 Muscle weakness (generalized): Secondary | ICD-10-CM

## 2020-12-27 DIAGNOSIS — R2689 Other abnormalities of gait and mobility: Secondary | ICD-10-CM

## 2020-12-27 DIAGNOSIS — R2681 Unsteadiness on feet: Secondary | ICD-10-CM

## 2020-12-27 DIAGNOSIS — M25551 Pain in right hip: Secondary | ICD-10-CM

## 2020-12-27 NOTE — Therapy (Signed)
[place] MA[facility] SERVICES [address] [place], [place], [address] Phone: [phone]   Fax:  [phone]  Physical Therapy Treatment  Patient Details  Name: Michael Cohen MRN: [id number] Date of Birth: 1940-11-18 Referring Provider (PT): [name]   Encounter Date: 12/27/2020   PT End of Session - 12/27/20 1440     Visit Number 39    Number of Visits 37    Date for PT Re-Evaluation 02/01/21    Authorization Type Medicare Parts A & B    Authorization Time Period Cert 6/60-6/30    PT Start Time 1432    PT Stop Time 1515    PT Time Calculation (min) 43 min    Equipment Utilized During Treatment Gait belt    Activity Tolerance Patient tolerated treatment well;No increased pain    Behavior During Therapy WFL for tasks assessed/performed             Past Medical History:  Diagnosis Date   Diabetes mellitus without complication ([place])    Hypercholesteremia    Hypertension     Past Surgical History:  Procedure Laterality Date   CATARACT EXTRACTION Bilateral    COLONOSCOPY WITH PROPOFOL N/A 12/12/2017   Procedure: COLONOSCOPY WITH PROPOFOL;  Surgeon: [name], [name], MD;  Location: [facility] ENDOSCOPY;  Service: Gastroenterology;  Laterality: N/A;   EYE SURGERY      There were no vitals filed for this visit.   Subjective Assessment - 12/27/20 1439     Subjective Patient reports increased stiffness in LE but denies any pain. He reports he didn't have to take tylenol until 10 AM this morning which is improvement;    Pertinent History 80 y.o. 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 His pain began around a month ago and is located over the proximal lateral thigh and in the right buttocks. He describes his pain as achy more than sharp. He is not sure of any aggravating activities. He reports of having some pain at rest today. He reports associated pain or "tightness"  felt along anterior and lateral portions of the lower leg. He was previously evaluated by [name] in podiatry and diagnosed with tibialis tendinitis. He denies associated groin pain, denies radicular symptoms. He has tried Baclofen without any relief of his symptoms. Of note, the patient states that he was scheduled to go on a cruise this week but was canceled due to the pain he is experiencing. He states that he would be unable to walk around and participate fully in his vacation given his pain. An anteroposterior view of the pelvis and anteroposterior and lateral views of the right hip were obtained. Images reveal mild loss of femoral acetabular joint space with mild osteophyte formation noted. CAM deformity of the right hip noted. No fractures or dislocations. Anteroposterior, oblique, and lateral views of the lumbar spine were obtained. Images reveal no scoliosis. Mild loss of lumbar lordosis noted. Significant loss of disc space noted at L4-L5 and L5-S1. Intervertebral osteophyte formation of L4 and L5. No fractures or wedging noted. Calcification of the abdominal aorta noted. Degeneration of the lumbar facets noted. Patient was recently diagnosed with lumbar radiculoapthy due to symptoms being more consistent vs hip pathology given the absence of hip symptoms with provocative maneuvers. Prednisone tapers have been considered but is on hold for now due to patient's diabetes.    Currently in Pain? No/denies    Multiple Pain  Sites No                  TREATMENT: Patient hooklying Lumbar trunk rotation x1 min x2 sets with cues to increase ROM to tolerance;   Single knee to chest stretch 15 sec hold x2 reps bilaterally;   Modified figure four stretch with PT assistance 60 seconds x 2 trials   SLR hip flexion with cues for knee extension x10 reps bilaterally;   Bridges with arms by side x15 reps;    Sidelying: -clamshells with green tband x15 reps x2 sets RLE only;  Gait around gym x160  feet with cues for increased step length for less trendelenburg gait and to improve cadence. Patient able to self correct with improved gait mechanics however with increased gait or when distracted often reverts back to uneven cadence/step length;    Standing: Right foot on floor, left foot on 8 inch step:  Head turns side/side x5 reps   BUE wand flexion x10 reps; Required CGA for safety with cues for shifting weight to RLE for better hip stabilization;   Forward step ups on 4 inch step with 2-1 rail assist x10 reps leading with RLE;  Stepping over orange hurdle with left foot and then back to starting position, with 1-0 rail assist x10 reps; required min A for safety and cues for weight shift and proper body mechanics. Patient unsteady and often reaches out for rail assist despite cues to reduce UE assist.   Forward/backward walking x10 feet x3 laps with cues to increase step length especially on LLE when walking backward for better reciprocal gait pattern;     Pt educated throughout session about proper posture and technique with exercises. Improved exercise technique, movement at target joints, use of target muscles after min to mod verbal, visual, tactile cues.                        PT Education - 12/27/20 1440     Education Details LE strengthening, step length;    Person(s) Educated Patient    Methods Explanation;Verbal cues    Comprehension Verbalized understanding;Returned demonstration;Verbal cues required;Need further instruction              PT Short Term Goals - 12/06/20 1501       PT SHORT TERM GOAL #1   Title Patient will be independent in home exercise program to improve strength/mobility for better functional independence with ADLs.    Baseline patient is compliant with all his HEP; attending water aerobics on days he's not coming to therapy.    Time 4    Period Weeks    Status Achieved    Target Date 08/03/20      PT SHORT TERM GOAL #2    Title Patient will increase RLE gross strength to 4+/5 as to improve functional strength for independent gait, increased standing tolerance and increased ADL ability.    Baseline 12/21: RLE gross strength 4/5 2/23: see note; 4/11: grossly 5/5 BLEs    Time 4    Period Weeks    Status On-going    Target Date 08/03/20               PT Long Term Goals - 12/06/20 1502       PT LONG TERM GOAL #1   Title Patient will increase FOTO score to equal to or greater than 67 to demonstrate statistically significant improvement in mobility and quality of life.  Baseline 12/21: 57 2/23: 74%, 5/23: 55%    Time 8    Period Weeks    Status Not Met    Target Date 02/01/21      PT LONG TERM GOAL #2   Title Patient (> 85 years old) will complete five times sit to stand test in <12 seconds indicating an increased LE strength and improved balance.    Baseline 12/21: 17.65s, 01/26: 14.47s; 3/16: 13.17sec; 4/11: 8.5 sec, 5/23: 15.25 sec    Time 8    Period Weeks    Status Not Met    Target Date 02/01/21      PT LONG TERM GOAL #3   Title Patient will increase six minute walk test distance to >1000 for progression to community ambulator and improve gait ability    Baseline 12/21: 500, 01/26: 655 ft 2/23: 735 ft; 3/16: 686f; 4/11: 737 ft    Time 8    Period Weeks    Status Deferred    Target Date 02/01/21      PT LONG TERM GOAL #4   Title Patient will reduce timed up and go to <11 seconds to reduce fall risk and demonstrate improved transfer/gait ability.    Baseline 12/21: 16.78s, 01/26: 12.08s 2/23: 10 sec, 5/23: 10 sec    Time 8    Period Weeks    Status Achieved    Target Date 02/01/21      PT LONG TERM GOAL #5   Title Patient will increase lower extremity functional scale to >60/80 to demonstrate improved functional mobility and increased tolerance with ADLs.    Baseline 12/21: 50/80, 01/26: 62/80    Time 8    Period Weeks    Status Achieved    Target Date 02/01/21                    Plan - 12/28/20 1046     Clinical Impression Statement Patient motivated and participated well within session. Instructed patient in advanced LE strengthening exercise focusing on posterior/lateral hip strengthening. Progressed standing activities working on improving step length for better gait mechanics. Patient continues to have difficulty with SLS tasks and with weight shift often leaning to RLE. He was able to exhibit less trendelenburg gait when cued to take larger steps. Plan to address goals next session. He would benefit from additional skilled PT Intervention to improve strength, balance and gait safety;    Personal Factors and Comorbidities Comorbidity 3+;Time since onset of injury/illness/exacerbation    Comorbidities diabetes, hypertension, hyperlipidemia    Examination-Activity Limitations Squat;Stairs;Transfers    Examination-Participation Restrictions Church    Stability/Clinical Decision Making Evolving/Moderate complexity    Rehab Potential Fair    PT Frequency 2x / week    PT Duration 8 weeks    PT Treatment/Interventions Moist Heat;Electrical Stimulation;Gait training;Stair training;Functional mobility training;Therapeutic activities;Therapeutic exercise;Balance training;Neuromuscular re-education;Patient/family education;Manual techniques;Passive range of motion;Energy conservation    PT Next Visit Plan Advance multiplanar, multi surface balance training; Right hip abduction, ER strength; assess mm length LEs, continue LE stretching and address back mobility    PT Home Exercise Plan no updates    Consulted and Agree with Plan of Care Patient             Patient will benefit from skilled therapeutic intervention in order to improve the following deficits and impairments:  Abnormal gait, Decreased endurance, Impaired sensation, Decreased activity tolerance, Decreased strength, Decreased balance, Decreased mobility, Difficulty walking, Decreased range of  motion, Decreased safety awareness,  Improper body mechanics  Visit Diagnosis: Other abnormalities of gait and mobility  Muscle weakness (generalized)  Pain in right hip  Unsteadiness on feet     Problem List There are no problems to display for this patient.   Mirna Sutcliffe PT, DPT 12/28/2020, 10:48 AM  [place] MAIN [facility] SERVICES [address] [place], [place], [address] Phone: [phone]   Fax:  [phone]  Name: Darian Cansler MRN: [id number] Date of Birth: 11-Apr-1941

## 2020-12-29 ENCOUNTER — Other Ambulatory Visit: Payer: Self-pay

## 2020-12-29 ENCOUNTER — Encounter: Payer: Self-pay | Admitting: Physical Therapy

## 2020-12-29 ENCOUNTER — Ambulatory Visit: Payer: Medicare Other | Admitting: Physical Therapy

## 2020-12-29 DIAGNOSIS — R2689 Other abnormalities of gait and mobility: Secondary | ICD-10-CM

## 2020-12-29 DIAGNOSIS — M6281 Muscle weakness (generalized): Secondary | ICD-10-CM

## 2020-12-29 DIAGNOSIS — R2681 Unsteadiness on feet: Secondary | ICD-10-CM

## 2020-12-29 DIAGNOSIS — M25551 Pain in right hip: Secondary | ICD-10-CM

## 2020-12-29 NOTE — Therapy (Signed)
[place] MAIN [facility] SERVICES [address] [place], [place], [address] Phone: [phone]   Fax:  [phone]  Physical Therapy Treatment Physical Therapy Progress Note   Dates of reporting period  12/09/20   to   12/29/20   Patient Details  Name: Michael Cohen MRN: [id number] Date of Birth: 02-02-1941 Referring Provider (PT): [name]   Encounter Date: 12/29/2020   PT End of Session - 12/29/20 1503     Visit Number 40    Number of Visits 17    Date for PT Re-Evaluation 02/01/21    Authorization Type Medicare Parts A & B    Authorization Time Period Cert 1/63-8/46    PT Start Time 1435    PT Stop Time 1515    PT Time Calculation (min) 40 min    Equipment Utilized During Treatment Gait belt    Activity Tolerance Patient tolerated treatment well;No increased pain    Behavior During Therapy WFL for tasks assessed/performed             Past Medical History:  Diagnosis Date   Diabetes mellitus without complication ([place])    Hypercholesteremia    Hypertension     Past Surgical History:  Procedure Laterality Date   CATARACT EXTRACTION Bilateral    COLONOSCOPY WITH PROPOFOL N/A 12/12/2017   Procedure: COLONOSCOPY WITH PROPOFOL;  Surgeon: [name], [name], MD;  Location: [facility] ENDOSCOPY;  Service: Gastroenterology;  Laterality: N/A;   EYE SURGERY      There were no vitals filed for this visit.   Subjective Assessment - 12/29/20 1502     Subjective Patient reports no new complaints; He is still having some stiffness in RLE but denies any pain; Reports adherence with HEP and is still doing aquatic exercise;    Pertinent History 80 y.o. 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 His pain began around a month ago and is located over the proximal lateral thigh and in the right buttocks. He describes his pain as achy more than sharp. He is not sure of any aggravating  activities. He reports of having some pain at rest today. He reports associated pain or "tightness" felt along anterior and lateral portions of the lower leg. He was previously evaluated by [name] in podiatry and diagnosed with tibialis tendinitis. He denies associated groin pain, denies radicular symptoms. He has tried Baclofen without any relief of his symptoms. Of note, the patient states that he was scheduled to go on a cruise this week but was canceled due to the pain he is experiencing. He states that he would be unable to walk around and participate fully in his vacation given his pain. An anteroposterior view of the pelvis and anteroposterior and lateral views of the right hip were obtained. Images reveal mild loss of femoral acetabular joint space with mild osteophyte formation noted. CAM deformity of the right hip noted. No fractures or dislocations. Anteroposterior, oblique, and lateral views of the lumbar spine were obtained. Images reveal no scoliosis. Mild loss of lumbar lordosis noted. Significant loss of disc space noted at L4-L5 and L5-S1. Intervertebral osteophyte formation of L4 and L5. No fractures or wedging noted. Calcification of the abdominal aorta noted. Degeneration of the lumbar facets noted. Patient was recently diagnosed with lumbar radiculoapthy due to symptoms being more consistent vs hip pathology given the absence of hip symptoms with provocative maneuvers. Prednisone tapers have been considered but is  on hold for now due to patient's diabetes.    Currently in Pain? No/denies    Multiple Pain Sites No                [facility] PT Assessment - 12/29/20 0001       Observation/Other Assessments   Focus on Therapeutic Outcomes (FOTO)  57%    Lower Extremity Functional Scale  61/100      6 Minute walk- Post Test   HR (bpm) 72    02 Sat (%RA) 99 %      6 minute walk test results    Aerobic Endurance Distance Walked 830    Endurance additional comments without AD,  improved from 737 feet on 4/11; limited community ambulator      Standardized Balance Assessment   Standardized Balance Assessment Five Times Sit to Stand;10 meter walk test    Five times sit to stand comments  8.25 sec with arms across chest      Timed Up and Go Test   Normal TUG (seconds) 8.8    TUG Comments without AD, low risk for falls               TREATMENT: PT instructed patient in outcome measures, see above  Patient's condition has the potential to improve in response to therapy. Maximum improvement is yet to be obtained. The anticipated improvement is attainable and reasonable in a generally predictable time.  Patient reports adherence with HEP and has been doing aquatic therapy. He reports no pain in right hip but is still feeling unsteady when walking, especially longer distances;                     PT Education - 12/29/20 1502     Education Details Progress towards goals    Person(s) Educated Patient    Methods Explanation    Comprehension Verbalized understanding              PT Short Term Goals - 12/06/20 1501       PT SHORT TERM GOAL #1   Title Patient will be independent in home exercise program to improve strength/mobility for better functional independence with ADLs.    Baseline patient is compliant with all his HEP; attending water aerobics on days he's not coming to therapy.    Time 4    Period Weeks    Status Achieved    Target Date 08/03/20      PT SHORT TERM GOAL #2   Title Patient will increase RLE gross strength to 4+/5 as to improve functional strength for independent gait, increased standing tolerance and increased ADL ability.    Baseline 12/21: RLE gross strength 4/5 2/23: see note; 4/11: grossly 5/5 BLEs    Time 4    Period Weeks    Status On-going    Target Date 08/03/20               PT Long Term Goals - 12/29/20 1451       PT LONG TERM GOAL #1   Title Patient will increase FOTO score to equal to or  greater than 67 to demonstrate statistically significant improvement in mobility and quality of life.    Baseline 12/21: 57 2/23: 74%, 5/23: 55%, 6/15: 57%    Time 8    Period Weeks    Status Not Met    Target Date 02/01/21      PT LONG TERM GOAL #2   Title Patient (>  51 years old) will complete five times sit to stand test in <12 seconds indicating an increased LE strength and improved balance.    Baseline 12/21: 17.65s, 01/26: 14.47s; 3/16: 13.17sec; 4/11: 8.5 sec, 5/23: 15.25 sec, 6/15: 8.25 sec with arms across chest;    Time 8    Period Weeks    Status Achieved    Target Date 02/01/21      PT LONG TERM GOAL #3   Title Patient will increase six minute walk test distance to >1000 for progression to community ambulator and improve gait ability    Baseline 12/21: 500, 01/26: 655 ft 2/23: 735 ft; 3/16: 634f; 4/11: 737 ft, 6/15: 830 feet    Time 8    Period Weeks    Status On-going    Target Date 02/01/21      PT LONG TERM GOAL #4   Title Patient will reduce timed up and go to <11 seconds to reduce fall risk and demonstrate improved transfer/gait ability.    Baseline 12/21: 16.78s, 01/26: 12.08s 2/23: 10 sec, 5/23: 10 sec, 6/15: 8.8 sec    Time 8    Period Weeks    Status Achieved    Target Date 02/01/21      PT LONG TERM GOAL #5   Title Patient will increase lower extremity functional scale to >60/80 to demonstrate improved functional mobility and increased tolerance with ADLs.    Baseline 12/21: 50/80, 01/26: 62/80, 6/15:61/80    Time 8    Period Weeks    Status Achieved    Target Date 02/01/21               Plan - 12/30/20 1554     Clinical Impression Statement Patient motivated and participated well within session. He was instructed in outcome measures to address progress towards goals. Patient continues to have LE weakness and trendelenburg gait with uneven cadence/step length. However his gait mechanics are improved when cued to take larger steps. When walking  longer distance his gait mechanics diminish with significant antalgic/trendelenburg gait and increased instability. He has made progress towards goals, but would benefit from additional skilled PT intervention to improve gait safety, particularly longer distance walking.    Personal Factors and Comorbidities Comorbidity 3+;Time since onset of injury/illness/exacerbation    Comorbidities diabetes, hypertension, hyperlipidemia    Examination-Activity Limitations Squat;Stairs;Transfers    Examination-Participation Restrictions Church    Stability/Clinical Decision Making Evolving/Moderate complexity    Rehab Potential Fair    PT Frequency 2x / week    PT Duration 8 weeks    PT Treatment/Interventions Moist Heat;Electrical Stimulation;Gait training;Stair training;Functional mobility training;Therapeutic activities;Therapeutic exercise;Balance training;Neuromuscular re-education;Patient/family education;Manual techniques;Passive range of motion;Energy conservation    PT Next Visit Plan Advance multiplanar, multi surface balance training; Right hip abduction, ER strength; assess mm length LEs, continue LE stretching and address back mobility    PT Home Exercise Plan no updates    Consulted and Agree with Plan of Care Patient                   Patient will benefit from skilled therapeutic intervention in order to improve the following deficits and impairments:     Visit Diagnosis: Other abnormalities of gait and mobility  Muscle weakness (generalized)  Pain in right hip  Unsteadiness on feet     Problem List There are no problems to display for this patient.   Kaydan Wong PT, DPT 12/29/2020, 3:29 PM  [facility] [place], [place], [address] Phone: [phone]   Fax:  [phone]  Name: Penny Frisbie MRN: [id number] Date of Birth: March 11, 1941

## 2021-01-03 ENCOUNTER — Ambulatory Visit: Payer: Medicare Other | Admitting: Physical Therapy

## 2021-01-05 ENCOUNTER — Ambulatory Visit: Payer: Medicare Other | Admitting: Physical Therapy

## 2021-01-10 ENCOUNTER — Other Ambulatory Visit: Payer: Self-pay

## 2021-01-10 ENCOUNTER — Encounter: Payer: Self-pay | Admitting: Physical Therapy

## 2021-01-10 ENCOUNTER — Ambulatory Visit: Payer: Medicare Other | Admitting: Physical Therapy

## 2021-01-10 DIAGNOSIS — R2681 Unsteadiness on feet: Secondary | ICD-10-CM

## 2021-01-10 DIAGNOSIS — R2689 Other abnormalities of gait and mobility: Secondary | ICD-10-CM

## 2021-01-10 DIAGNOSIS — M6281 Muscle weakness (generalized): Secondary | ICD-10-CM

## 2021-01-10 DIAGNOSIS — M25551 Pain in right hip: Secondary | ICD-10-CM

## 2021-01-10 NOTE — Therapy (Signed)
[place] MAIN [facility] SERVICES [address] [place], [place], [address] Phone: [phone]   Fax:  [phone]  Physical Therapy Treatment  Patient Details  Name: Michael Cohen MRN: [id number] Date of Birth: August 19, 1940 Referring Provider (PT): [name]   Encounter Date: 01/10/2021   PT End of Session - 01/11/21 0930     Visit Number 41    Number of Visits 61    Date for PT Re-Evaluation 02/01/21    Authorization Type Medicare Parts A & B    Authorization Time Period Cert 2/45-8/09    PT Start Time 1432    PT Stop Time 1515    PT Time Calculation (min) 43 min    Equipment Utilized During Treatment Gait belt    Activity Tolerance Patient tolerated treatment well;No increased pain    Behavior During Therapy WFL for tasks assessed/performed             Past Medical History:  Diagnosis Date   Diabetes mellitus without complication ([place])    Hypercholesteremia    Hypertension     Past Surgical History:  Procedure Laterality Date   CATARACT EXTRACTION Bilateral    COLONOSCOPY WITH PROPOFOL N/A 12/12/2017   Procedure: COLONOSCOPY WITH PROPOFOL;  Surgeon: [name], [name], MD;  Location: [facility] ENDOSCOPY;  Service: Gastroenterology;  Laterality: N/A;   EYE SURGERY      There were no vitals filed for this visit.   Subjective Assessment - 01/10/21 1437     Subjective Patient reports no new complaints; He reports less pain in last week only having to take tylenol every other day.    Pertinent History 80 y.o. 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 His pain began around a month ago and is located over the proximal lateral thigh and in the right buttocks. He describes his pain as achy more than sharp. He is not sure of any aggravating activities. He reports of having some pain at rest today. He reports associated pain or "tightness" felt along anterior and lateral  portions of the lower leg. He was previously evaluated by [name] in podiatry and diagnosed with tibialis tendinitis. He denies associated groin pain, denies radicular symptoms. He has tried Baclofen without any relief of his symptoms. Of note, the patient states that he was scheduled to go on a cruise this week but was canceled due to the pain he is experiencing. He states that he would be unable to walk around and participate fully in his vacation given his pain. An anteroposterior view of the pelvis and anteroposterior and lateral views of the right hip were obtained. Images reveal mild loss of femoral acetabular joint space with mild osteophyte formation noted. CAM deformity of the right hip noted. No fractures or dislocations. Anteroposterior, oblique, and lateral views of the lumbar spine were obtained. Images reveal no scoliosis. Mild loss of lumbar lordosis noted. Significant loss of disc space noted at L4-L5 and L5-S1. Intervertebral osteophyte formation of L4 and L5. No fractures or wedging noted. Calcification of the abdominal aorta noted. Degeneration of the lumbar facets noted. Patient was recently diagnosed with lumbar radiculoapthy due to symptoms being more consistent vs hip pathology given the absence of hip symptoms with provocative maneuvers. Prednisone tapers have been considered but is on hold for now due to patient's diabetes.    Currently in Pain? No/denies    Multiple Pain Sites No  TREATMENT: Seated LE piriformis stretch figure 4, 30 sec hold x2 reps each LE;    Gait around gym x160 feet with cues for increased step length for less trendelenburg gait and to improve cadence. Patient able to self correct with improved gait mechanics however with increased gait or when distracted often reverts back to uneven cadence/step length;   Standing with green tband around BLE: -hip abduction x15 reps each LE -hip extension x10 reps each LE; Patient required  min-moderate verbal/tactile cues for correct exercise technique.  BLE heel raises x10 reps unsupported;   Seated: Hamstring curl green tband x10 reps each LE;    Standing: One foot on floor, one foot on 8 inch step:             Head turns side/side , up/down x5 reps                Required CGA for safety with cues for shifting weight to RLE for better hip stabilization;    Forward step ups on 4 inch step with 2-1 rail assist x10 reps leading with RLE; Standing on step, hip flexion march against green tband x10 reps each LE, moderate difficulty reported requiring BUE rail assist;    Stepping over orange hurdle forward/backward, with 1 rail assist x10 reps;  Side stepping over orange hurdle with 2-1 rail assist x10 reps each direction; Required min A for safety and cues for weight shift and proper body mechanics. Patient unsteady and often reaches out for rail assist despite cues to reduce UE assist.   Forward/backward walking x20 feet x2 laps with cues to increase step length especially on LLE when walking backward for better reciprocal gait pattern;     Pt educated throughout session about proper posture and technique with exercises. Improved exercise technique, movement at target joints, use of target muscles after min to mod verbal, visual, tactile cues.                           PT Education - 01/10/21 1438     Education Details LE strengthening/stretch, HEP    Person(s) Educated Patient    Methods Explanation;Verbal cues    Comprehension Verbalized understanding;Returned demonstration;Verbal cues required;Need further instruction              PT Short Term Goals - 12/06/20 1501       PT SHORT TERM GOAL #1   Title Patient will be independent in home exercise program to improve strength/mobility for better functional independence with ADLs.    Baseline patient is compliant with all his HEP; attending water aerobics on days he's not coming to therapy.     Time 4    Period Weeks    Status Achieved    Target Date 08/03/20      PT SHORT TERM GOAL #2   Title Patient will increase RLE gross strength to 4+/5 as to improve functional strength for independent gait, increased standing tolerance and increased ADL ability.    Baseline 12/21: RLE gross strength 4/5 2/23: see note; 4/11: grossly 5/5 BLEs    Time 4    Period Weeks    Status On-going    Target Date 08/03/20               PT Long Term Goals - 12/29/20 1451       PT LONG TERM GOAL #1   Title Patient will increase FOTO score to equal to or greater  than 67 to demonstrate statistically significant improvement in mobility and quality of life.    Baseline 12/21: 57 2/23: 74%, 5/23: 55%, 6/15: 57%    Time 8    Period Weeks    Status Not Met    Target Date 02/01/21      PT LONG TERM GOAL #2   Title Patient (> 77 years old) will complete five times sit to stand test in <12 seconds indicating an increased LE strength and improved balance.    Baseline 12/21: 17.65s, 01/26: 14.47s; 3/16: 13.17sec; 4/11: 8.5 sec, 5/23: 15.25 sec, 6/15: 8.25 sec with arms across chest;    Time 8    Period Weeks    Status Achieved    Target Date 02/01/21      PT LONG TERM GOAL #3   Title Patient will increase six minute walk test distance to >1000 for progression to community ambulator and improve gait ability    Baseline 12/21: 500, 01/26: 655 ft 2/23: 735 ft; 3/16: 640f; 4/11: 737 ft, 6/15: 830 feet    Time 8    Period Weeks    Status On-going    Target Date 02/01/21      PT LONG TERM GOAL #4   Title Patient will reduce timed up and go to <11 seconds to reduce fall risk and demonstrate improved transfer/gait ability.    Baseline 12/21: 16.78s, 01/26: 12.08s 2/23: 10 sec, 5/23: 10 sec, 6/15: 8.8 sec    Time 8    Period Weeks    Status Achieved    Target Date 02/01/21      PT LONG TERM GOAL #5   Title Patient will increase lower extremity functional scale to >60/80 to demonstrate improved  functional mobility and increased tolerance with ADLs.    Baseline 12/21: 50/80, 01/26: 62/80, 6/15:61/80    Time 8    Period Weeks    Status Achieved    Target Date 02/01/21                   Plan - 01/11/21 0931     Clinical Impression Statement Patient motivated and participated well within session. instructed patient in advanced LE strengthening exercise. He does require min VCs for proper exercise technique and positioning. Patient continues to have decreased hip control and uneven weight shift/cadence during ambulation. Progressed LE strengthening,r educing rail assist to facilitate better LE strengthening. Patient denies any increase in pain with advanced exercise. He would benefit from additional skilled PT Intervention to improve strength, balance and mobility;    Personal Factors and Comorbidities Comorbidity 3+;Time since onset of injury/illness/exacerbation    Comorbidities diabetes, hypertension, hyperlipidemia    Examination-Activity Limitations Squat;Stairs;Transfers    Examination-Participation Restrictions Church    Stability/Clinical Decision Making Evolving/Moderate complexity    Rehab Potential Fair    PT Frequency 2x / week    PT Duration 8 weeks    PT Treatment/Interventions Moist Heat;Electrical Stimulation;Gait training;Stair training;Functional mobility training;Therapeutic activities;Therapeutic exercise;Balance training;Neuromuscular re-education;Patient/family education;Manual techniques;Passive range of motion;Energy conservation    PT Next Visit Plan Advance multiplanar, multi surface balance training; Right hip abduction, ER strength; assess mm length LEs, continue LE stretching and address back mobility    PT Home Exercise Plan no updates    Consulted and Agree with Plan of Care Patient             Patient will benefit from skilled therapeutic intervention in order to improve the following deficits and impairments:  Abnormal gait, Decreased  endurance, Impaired sensation, Decreased activity tolerance, Decreased strength, Decreased balance, Decreased mobility, Difficulty walking, Decreased range of motion, Decreased safety awareness, Improper body mechanics  Visit Diagnosis: Other abnormalities of gait and mobility  Muscle weakness (generalized)  Pain in right hip  Unsteadiness on feet     Problem List There are no problems to display for this patient.   Ellaree Gear PT, DPT 01/11/2021, 9:38 AM  [place] MAIN [facility] SERVICES [address] [place], [place], [address] Phone: [phone]   Fax:  [phone]  Name: Michael Cohen MRN: [id number] Date of Birth: 08-Mar-1941

## 2021-01-12 ENCOUNTER — Ambulatory Visit: Payer: Medicare Other | Admitting: Physical Therapy

## 2021-01-12 ENCOUNTER — Other Ambulatory Visit: Payer: Self-pay

## 2021-01-12 ENCOUNTER — Encounter: Payer: Self-pay | Admitting: Physical Therapy

## 2021-01-12 DIAGNOSIS — R2689 Other abnormalities of gait and mobility: Secondary | ICD-10-CM

## 2021-01-12 DIAGNOSIS — M25551 Pain in right hip: Secondary | ICD-10-CM

## 2021-01-12 DIAGNOSIS — R2681 Unsteadiness on feet: Secondary | ICD-10-CM

## 2021-01-12 DIAGNOSIS — M6281 Muscle weakness (generalized): Secondary | ICD-10-CM

## 2021-01-12 NOTE — Therapy (Signed)
[place] MAIN [facility] SERVICES [address] [place], [place], [address] Phone: [phone]   Fax:  [phone]  Physical Therapy Treatment  Patient Details  Name: Michael Cohen MRN: [id number] Date of Birth: [birth year] Referring Provider (PT): [name]   Encounter Date: 01/12/2021   PT End of Session - 01/12/21 1444     Visit Number 42    Number of Visits 38    Date for PT Re-Evaluation 02/01/21    Authorization Type Medicare Parts A & B    Authorization Time Period Cert 4/43-1/54    PT Start Time 1435    PT Stop Time 1515    PT Time Calculation (min) 40 min    Equipment Utilized During Treatment Gait belt    Activity Tolerance Patient tolerated treatment well;No increased pain    Behavior During Therapy WFL for tasks assessed/performed             Past Medical History:  Diagnosis Date   Diabetes mellitus without complication ([place])    Hypercholesteremia    Hypertension     Past Surgical History:  Procedure Laterality Date   CATARACT EXTRACTION Bilateral    COLONOSCOPY WITH PROPOFOL N/A 12/12/2017   Procedure: COLONOSCOPY WITH PROPOFOL;  Surgeon: [name], [name], MD;  Location: [facility] ENDOSCOPY;  Service: Gastroenterology;  Laterality: N/A;   EYE SURGERY      There were no vitals filed for this visit.   Subjective Assessment - 01/12/21 1443     Subjective Patient reports no new complaints; He reports less pain in last week only having to take tylenol every other day. He reports increased stiffness today;    Pertinent History 80 y.o. 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 His pain began around a month ago and is located over the proximal lateral thigh and in the right buttocks. He describes his pain as achy more than sharp. He is not sure of any aggravating activities. He reports of having some pain at rest today. He reports associated pain or "tightness"  felt along anterior and lateral portions of the lower leg. He was previously evaluated by [name] in podiatry and diagnosed with tibialis tendinitis. He denies associated groin pain, denies radicular symptoms. He has tried Baclofen without any relief of his symptoms. Of note, the patient states that he was scheduled to go on a cruise this week but was canceled due to the pain he is experiencing. He states that he would be unable to walk around and participate fully in his vacation given his pain. An anteroposterior view of the pelvis and anteroposterior and lateral views of the right hip were obtained. Images reveal mild loss of femoral acetabular joint space with mild osteophyte formation noted. CAM deformity of the right hip noted. No fractures or dislocations. Anteroposterior, oblique, and lateral views of the lumbar spine were obtained. Images reveal no scoliosis. Mild loss of lumbar lordosis noted. Significant loss of disc space noted at L4-L5 and L5-S1. Intervertebral osteophyte formation of L4 and L5. No fractures or wedging noted. Calcification of the abdominal aorta noted. Degeneration of the lumbar facets noted. Patient was recently diagnosed with lumbar radiculoapthy due to symptoms being more consistent vs hip pathology given the absence of hip symptoms with provocative maneuvers. Prednisone tapers have been considered but is on hold for now due to patient's diabetes.    Currently in Pain? No/denies    Multiple Pain Sites  No                  TREATMENT: Seated LE piriformis stretch figure 4, 30 sec hold x2 reps each LE;    Seated: Hamstring curl green tband x10 reps each LE;   Sit<>Stand with arms across chest x5 reps;    Gait around gym x160 feet with cues for increased step length for less trendelenburg gait and to improve cadence. Patient able to self correct with improved gait mechanics however with increased gait or when distracted often reverts back to uneven cadence/step  length;  While walking patient reports increased stiffness in low back;  Instructed patient in hooklying exercise:  -Lumbar trunk rotation x1 min x2 sets; Required cues to slow down rotation for increased stretch; -posterior pelvic tilt 5 sec hold x10 reps; min VCs for proper positioning -single knee to chest stretch 20 sec hold x2 reps bilaterally; -Bridge with arms by side x15 reps with min VCS to avoid painful ROM;   Standing with rail assist: -single leg march x10 reps each LE with finger tip hold and cues to increase ROM  Attempted SLS, patient unable to hold  Standing beside wall with chair beside patient: Lifting LLE isometric hip abduction 3 sec hold x5 reps; patient able to progress without holding onto chair with better stabilization in stance;        Pt educated throughout session about proper posture and technique with exercises. Improved exercise technique, movement at target joints, use of target muscles after min to mod verbal, visual, tactile cues.  He denies any increase in pain with advanced exercise;                        PT Education - 01/12/21 1443     Education Details LE strengthening/stretch/HEP    Person(s) Educated Patient    Methods Explanation;Verbal cues    Comprehension Verbalized understanding;Returned demonstration;Verbal cues required;Need further instruction              PT Short Term Goals - 12/06/20 1501       PT [place] #1   Title Patient will be independent in home exercise program to improve strength/mobility for better functional independence with ADLs.    Baseline patient is compliant with all his HEP; attending water aerobics on days he's not coming to therapy.    Time 4    Period Weeks    Status Achieved    Target Date 08/03/20      PT SHORT TERM GOAL #2   Title Patient will increase RLE gross strength to 4+/5 as to improve functional strength for independent gait, increased standing tolerance and  increased ADL ability.    Baseline 12/21: RLE gross strength 4/5 2/23: see note; 4/11: grossly 5/5 BLEs    Time 4    Period Weeks    Status On-going    Target Date 08/03/20               PT Long Term Goals - 12/29/20 1451       PT LONG TERM GOAL #1   Title Patient will increase FOTO score to equal to or greater than 67 to demonstrate statistically significant improvement in mobility and quality of life.    Baseline 12/21: 57 2/23: 74%, 5/23: 55%, 6/15: 57%    Time 8    Period Weeks    Status Not Met    Target Date 02/01/21  PT LONG TERM GOAL #2   Title Patient (> 76 years old) will complete five times sit to stand test in <12 seconds indicating an increased LE strength and improved balance.    Baseline 12/21: 17.65s, 01/26: 14.47s; 3/16: 13.17sec; 4/11: 8.5 sec, 5/23: 15.25 sec, 6/15: 8.25 sec with arms across chest;    Time 8    Period Weeks    Status Achieved    Target Date 02/01/21      PT LONG TERM GOAL #3   Title Patient will increase six minute walk test distance to >1000 for progression to community ambulator and improve gait ability    Baseline 12/21: 500, 01/26: 655 ft 2/23: 735 ft; 3/16: 61f; 4/11: 737 ft, 6/15: 830 feet    Time 8    Period Weeks    Status On-going    Target Date 02/01/21      PT LONG TERM GOAL #4   Title Patient will reduce timed up and go to <11 seconds to reduce fall risk and demonstrate improved transfer/gait ability.    Baseline 12/21: 16.78s, 01/26: 12.08s 2/23: 10 sec, 5/23: 10 sec, 6/15: 8.8 sec    Time 8    Period Weeks    Status Achieved    Target Date 02/01/21      PT LONG TERM GOAL #5   Title Patient will increase lower extremity functional scale to >60/80 to demonstrate improved functional mobility and increased tolerance with ADLs.    Baseline 12/21: 50/80, 01/26: 62/80, 6/15:61/80    Time 8    Period Weeks    Status Achieved    Target Date 02/01/21                   Plan - 01/12/21 1522     Clinical  Impression Statement Patient motivated and participated well within session. He does exhibit increased stiffness this session with uneven cadence/step length with gait. Instructed patient in lumbar flexion exercise to facilitate better flexibility. Patient does require min VCs for proper positioning exercise. He does report fatigue in RLE hip abductors but denies any pain. Patient instructed to increase figure 4 piriformis stretch at home for better flexibility. He would benefit from additional skilled PT intervention to improve strength, balance and mobility;    Personal Factors and Comorbidities Comorbidity 3+;Time since onset of injury/illness/exacerbation    Comorbidities diabetes, hypertension, hyperlipidemia    Examination-Activity Limitations Squat;Stairs;Transfers    Examination-Participation Restrictions [employer]    Stability/Clinical Decision Making Evolving/Moderate complexity    Rehab Potential Fair    PT Frequency 2x / week    PT Duration 8 weeks    PT Treatment/Interventions Moist Heat;Electrical Stimulation;Gait training;Stair training;Functional mobility training;Therapeutic activities;Therapeutic exercise;Balance training;Neuromuscular re-education;Patient/family education;Manual techniques;Passive range of motion;Energy conservation    PT Next Visit Plan Advance multiplanar, multi surface balance training; Right hip abduction, ER strength; assess mm length LEs, continue LE stretching and address back mobility    PT Home Exercise Plan no updates    Consulted and Agree with Plan of Care Patient             Patient will benefit from skilled therapeutic intervention in order to improve the following deficits and impairments:  Abnormal gait, Decreased endurance, Impaired sensation, Decreased activity tolerance, Decreased strength, Decreased balance, Decreased mobility, Difficulty walking, Decreased range of motion, Decreased safety awareness, Improper body mechanics  Visit  Diagnosis: Other abnormalities of gait and mobility  Muscle weakness (generalized)  Pain in right hip  Unsteadiness on feet  Problem List There are no problems to display for this patient.   Anahis Furgeson PT, DPT 01/12/2021, 3:24 PM  [place] MAIN [facility] SERVICES [address] [place], [place], [address] Phone: [phone]   Fax:  [phone]  Name: Izaiah Tabb MRN: [id number] Date of Birth: 08-14-1940

## 2021-01-19 ENCOUNTER — Other Ambulatory Visit: Payer: Self-pay

## 2021-01-19 ENCOUNTER — Encounter: Payer: Self-pay | Admitting: Physical Therapy

## 2021-01-19 ENCOUNTER — Ambulatory Visit: Payer: Medicare Other | Attending: Internal Medicine | Admitting: Physical Therapy

## 2021-01-19 DIAGNOSIS — M25551 Pain in right hip: Secondary | ICD-10-CM | POA: Insufficient documentation

## 2021-01-19 DIAGNOSIS — R278 Other lack of coordination: Secondary | ICD-10-CM | POA: Diagnosis present

## 2021-01-19 DIAGNOSIS — R2681 Unsteadiness on feet: Secondary | ICD-10-CM | POA: Diagnosis present

## 2021-01-19 DIAGNOSIS — M6281 Muscle weakness (generalized): Secondary | ICD-10-CM | POA: Insufficient documentation

## 2021-01-19 DIAGNOSIS — R2689 Other abnormalities of gait and mobility: Secondary | ICD-10-CM | POA: Diagnosis present

## 2021-01-19 NOTE — Therapy (Signed)
[place] MAIN [facility] SERVICES [address] [place], [place], [address] Phone: [phone]   Fax:  [phone]  Physical Therapy Treatment  Patient Details  Name: Michael Cohen MRN: [id number] Date of Birth: 1940/10/13 Referring Provider (PT): [name]   Encounter Date: 01/19/2021   PT End of Session - 01/19/21 1354     Visit Number 43    Number of Visits 61    Date for PT Re-Evaluation 02/01/21    Authorization Type Medicare Parts A & B    Authorization Time Period Cert 8/36-6/29    PT Start Time 4765    PT Stop Time 1430    PT Time Calculation (min) 42 min    Equipment Utilized During Treatment Gait belt    Activity Tolerance Patient tolerated treatment well;No increased pain    Behavior During Therapy WFL for tasks assessed/performed             Past Medical History:  Diagnosis Date   Diabetes mellitus without complication ([place])    Hypercholesteremia    Hypertension     Past Surgical History:  Procedure Laterality Date   CATARACT EXTRACTION Bilateral    COLONOSCOPY WITH PROPOFOL N/A 12/12/2017   Procedure: COLONOSCOPY WITH PROPOFOL;  Surgeon: [name], [name], MD;  Location: [facility] ENDOSCOPY;  Service: Gastroenterology;  Laterality: N/A;   EYE SURGERY      There were no vitals filed for this visit.   Subjective Assessment - 01/19/21 1353     Subjective Patient reports no new complaints;  He reports he is still limping some but is still having no pain. Denies any falls;    Pertinent History 80 y.o. 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 His pain began around a month ago and is located over the proximal lateral thigh and in the right buttocks. He describes his pain as achy more than sharp. He is not sure of any aggravating activities. He reports of having some pain at rest today. He reports associated pain or "tightness" felt along anterior and lateral  portions of the lower leg. He was previously evaluated by [name] in podiatry and diagnosed with tibialis tendinitis. He denies associated groin pain, denies radicular symptoms. He has tried Baclofen without any relief of his symptoms. Of note, the patient states that he was scheduled to go on a cruise this week but was canceled due to the pain he is experiencing. He states that he would be unable to walk around and participate fully in his vacation given his pain. An anteroposterior view of the pelvis and anteroposterior and lateral views of the right hip were obtained. Images reveal mild loss of femoral acetabular joint space with mild osteophyte formation noted. CAM deformity of the right hip noted. No fractures or dislocations. Anteroposterior, oblique, and lateral views of the lumbar spine were obtained. Images reveal no scoliosis. Mild loss of lumbar lordosis noted. Significant loss of disc space noted at L4-L5 and L5-S1. Intervertebral osteophyte formation of L4 and L5. No fractures or wedging noted. Calcification of the abdominal aorta noted. Degeneration of the lumbar facets noted. Patient was recently diagnosed with lumbar radiculoapthy due to symptoms being more consistent vs hip pathology given the absence of hip symptoms with provocative maneuvers. Prednisone tapers have been considered but is on hold for now due to patient's diabetes.    Currently in Pain? No/denies    Multiple Pain Sites No  TREATMENT: Seated LE piriformis stretch figure 4, 30 sec hold x2 reps each LE;    Seated: Hamstring curl green tband x15 reps each LE;    Sit<>Stand with arms across chest x10 reps;    Gait around gym x160 feet with cues for increased step length for less trendelenburg gait and to improve cadence. Patient able to self correct with improved gait mechanics however with increased gait or when distracted often reverts back to uneven cadence/step length;  While walking patient  reports increased stiffness in low back;  Forward step ups on 6 inch step with BUE rail assist x10 reps each LE  Standing on step, Hip flexion march against green tband x10 reps each LE with cues to place foot on 3rd step to increase ROM;    Stepping over orange hurdle x10 reps  with 1 rail assist Progressed to stepping over orange hurdle with LLE and backwards x5 reps unsupported to facilitate better weight bearing in RLE;   Weaving around cones #6 x4 sets with cues to increase step length especially on LLE to reduce limping and improve gait mechanics;    Pt educated throughout session about proper posture and technique with exercises. Improved exercise technique, movement at target joints, use of target muscles after min to mod verbal, visual, tactile cues.   He denies any increase in pain with advanced exercise;                           PT Education - 01/19/21 1354     Education Details LE strengthening/stretch/HEP    Person(s) Educated Patient    Methods Explanation;Verbal cues    Comprehension Verbalized understanding;Returned demonstration;Verbal cues required;Need further instruction              PT Short Term Goals - 12/06/20 1501       PT SHORT TERM GOAL #1   Title Patient will be independent in home exercise program to improve strength/mobility for better functional independence with ADLs.    Baseline patient is compliant with all his HEP; attending water aerobics on days he's not coming to therapy.    Time 4    Period Weeks    Status Achieved    Target Date 08/03/20      PT SHORT TERM GOAL #2   Title Patient will increase RLE gross strength to 4+/5 as to improve functional strength for independent gait, increased standing tolerance and increased ADL ability.    Baseline 12/21: RLE gross strength 4/5 2/23: see note; 4/11: grossly 5/5 BLEs    Time 4    Period Weeks    Status On-going    Target Date 08/03/20               PT Long  Term Goals - 12/29/20 1451       PT LONG TERM GOAL #1   Title Patient will increase FOTO score to equal to or greater than 67 to demonstrate statistically significant improvement in mobility and quality of life.    Baseline 12/21: 57 2/23: 74%, 5/23: 55%, 6/15: 57%    Time 8    Period Weeks    Status Not Met    Target Date 02/01/21      PT LONG TERM GOAL #2   Title Patient (> 22 years old) will complete five times sit to stand test in <12 seconds indicating an increased LE strength and improved balance.    Baseline 12/21: 17.65s, 01/26:  14.47s; 3/16: 13.17sec; 4/11: 8.5 sec, 5/23: 15.25 sec, 6/15: 8.25 sec with arms across chest;    Time 8    Period Weeks    Status Achieved    Target Date 02/01/21      PT LONG TERM GOAL #3   Title Patient will increase six minute walk test distance to >1000 for progression to community ambulator and improve gait ability    Baseline 12/21: 500, 01/26: 655 ft 2/23: 735 ft; 3/16: 664f; 4/11: 737 ft, 6/15: 830 feet    Time 8    Period Weeks    Status On-going    Target Date 02/01/21      PT LONG TERM GOAL #4   Title Patient will reduce timed up and go to <11 seconds to reduce fall risk and demonstrate improved transfer/gait ability.    Baseline 12/21: 16.78s, 01/26: 12.08s 2/23: 10 sec, 5/23: 10 sec, 6/15: 8.8 sec    Time 8    Period Weeks    Status Achieved    Target Date 02/01/21      PT LONG TERM GOAL #5   Title Patient will increase lower extremity functional scale to >60/80 to demonstrate improved functional mobility and increased tolerance with ADLs.    Baseline 12/21: 50/80, 01/26: 62/80, 6/15:61/80    Time 8    Period Weeks    Status Achieved    Target Date 02/01/21                   Plan - 01/19/21 1436     Clinical Impression Statement Patient motivated and participated well within session. He was instructed in advanced LE strengthening exercise. Progressed exercise with increased standing/weight bearing tasks. Patient  continues to require rail assist for stance control. He does exhibit increased difficulty with RLE weight shift requiring cues to increase LLE step length for less trendelenburg/antalgic gait. Patient would benefit from additional skilled PT Intervention to improve strength, balance and mobility;    Personal Factors and Comorbidities Comorbidity 3+;Time since onset of injury/illness/exacerbation    Comorbidities diabetes, hypertension, hyperlipidemia    Examination-Activity Limitations Squat;Stairs;Transfers    Examination-Participation Restrictions [employer]    Stability/Clinical Decision Making Evolving/Moderate complexity    Rehab Potential Fair    PT Frequency 2x / week    PT Duration 8 weeks    PT Treatment/Interventions Moist Heat;Electrical Stimulation;Gait training;Stair training;Functional mobility training;Therapeutic activities;Therapeutic exercise;Balance training;Neuromuscular re-education;Patient/family education;Manual techniques;Passive range of motion;Energy conservation    PT Next Visit Plan Advance multiplanar, multi surface balance training; Right hip abduction, ER strength; assess mm length LEs, continue LE stretching and address back mobility    PT Home Exercise Plan no updates    Consulted and Agree with Plan of Care Patient             Patient will benefit from skilled therapeutic intervention in order to improve the following deficits and impairments:  Abnormal gait, Decreased endurance, Impaired sensation, Decreased activity tolerance, Decreased strength, Decreased balance, Decreased mobility, Difficulty walking, Decreased range of motion, Decreased safety awareness, Improper body mechanics  Visit Diagnosis: Other abnormalities of gait and mobility  Muscle weakness (generalized)  Pain in right hip  Unsteadiness on feet     Problem List There are no problems to display for this patient.   , PT, DPT 01/19/2021, 2:41 PM  [place] [facility] [address]RP[address] [place] [address]Phone: [phone]  Fax:  [phone] Name: Michael Cohen  MRN: [id number] Date of Birth: 05-31-1941

## 2021-01-24 ENCOUNTER — Ambulatory Visit: Payer: Medicare Other

## 2021-01-24 ENCOUNTER — Other Ambulatory Visit: Payer: Self-pay

## 2021-01-24 DIAGNOSIS — R2681 Unsteadiness on feet: Secondary | ICD-10-CM

## 2021-01-24 DIAGNOSIS — M6281 Muscle weakness (generalized): Secondary | ICD-10-CM

## 2021-01-24 DIAGNOSIS — R278 Other lack of coordination: Secondary | ICD-10-CM

## 2021-01-24 DIAGNOSIS — R2689 Other abnormalities of gait and mobility: Secondary | ICD-10-CM | POA: Diagnosis not present

## 2021-01-24 NOTE — Therapy (Signed)
[place] MAIN [facility] SERVICES [address] [place], [place], [address] Phone: [phone]   Fax:  [phone]  Physical Therapy Treatment  Patient Details  Name: Michael Cohen MRN: [id number] Date of Birth: March 27, 1941 Referring Provider (PT): [name]   Encounter Date: 01/24/2021   PT End of Session - 01/24/21 1714     Visit Number 44    Number of Visits 100    Date for PT Re-Evaluation 02/01/21    Authorization Type Medicare Parts A & B    Authorization Time Period Cert 7/11-6/57    PT Start Time 1346    PT Stop Time 1430    PT Time Calculation (min) 44 min    Equipment Utilized During Treatment Gait belt    Activity Tolerance Patient tolerated treatment well;No increased pain    Behavior During Therapy WFL for tasks assessed/performed             Past Medical History:  Diagnosis Date   Diabetes mellitus without complication ([place])    Hypercholesteremia    Hypertension     Past Surgical History:  Procedure Laterality Date   CATARACT EXTRACTION Bilateral    COLONOSCOPY WITH PROPOFOL N/A 12/12/2017   Procedure: COLONOSCOPY WITH PROPOFOL;  Surgeon: [name], [name], MD;  Location: [facility] ENDOSCOPY;  Service: Gastroenterology;  Laterality: N/A;   EYE SURGERY      There were no vitals filed for this visit.   Subjective Assessment - 01/24/21 1713     Subjective Pt reports no pain currently. Pt is his wife's cargiver currently as she is recovering from surgery.    Pertinent History 80 y.o. 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 His pain began around a month ago and is located over the proximal lateral thigh and in the right buttocks. He describes his pain as achy more than sharp. He is not sure of any aggravating activities. He reports of having some pain at rest today. He reports associated pain or "tightness" felt along anterior and lateral portions of the  lower leg. He was previously evaluated by [name] in podiatry and diagnosed with tibialis tendinitis. He denies associated groin pain, denies radicular symptoms. He has tried Baclofen without any relief of his symptoms. Of note, the patient states that he was scheduled to go on a cruise this week but was canceled due to the pain he is experiencing. He states that he would be unable to walk around and participate fully in his vacation given his pain. An anteroposterior view of the pelvis and anteroposterior and lateral views of the right hip were obtained. Images reveal mild loss of femoral acetabular joint space with mild osteophyte formation noted. CAM deformity of the right hip noted. No fractures or dislocations. Anteroposterior, oblique, and lateral views of the lumbar spine were obtained. Images reveal no scoliosis. Mild loss of lumbar lordosis noted. Significant loss of disc space noted at L4-L5 and L5-S1. Intervertebral osteophyte formation of L4 and L5. No fractures or wedging noted. Calcification of the abdominal aorta noted. Degeneration of the lumbar facets noted. Patient was recently diagnosed with lumbar radiculoapthy due to symptoms being more consistent vs hip pathology given the absence of hip symptoms with provocative maneuvers. Prednisone tapers have been considered but is on hold for now due to patient's diabetes.    Currently in Pain? No/denies             TREATMENT:  THEREX-   Seated piriformis stretch - 2x30 sec each LE  Seated @ [employer] cable machine: hamstring curls - 2.5# 10x each LE, progressed to 7.5# 10x, 15x; rates easy-medium throughout.   Sit<>Stand with arms across chest 2x10 reps; rates medium, continues to report no pain with therex.   Side-stepping in // bars for increased hip abduction - 6x length of bars. No pain with exercise. Pt uses lateral approach even with cues for improved technique.   Forward step-ups on 6 inch step with high-knee marches, pt uses   BUE rail assist x15 reps each LE  Over 10 meter track, pt ambulating with PT providing cues for increased step-length B to improve trendelenburg gait and cadence. PT also cues pt for B arm-swing as pt exhibits decreased arm-swing. Pt intermittently able to self-correct.     Stepping over orange hurdle forward/backward x10 reps with each LE as leading leg. Pt uses UUE support. Initial difficulty clearing hurdle, but improves with reps.   Pt educated throughout session about proper posture and technique with exercises. Improved exercise technique, movement at target joints, use of target muscles after min to mod verbal, visual, tactile cues.  Pt reports no pain with all exercises performed this session.     PT Education - 01/24/21 1714     Education Details exercise technique, body mechanics    Person(s) Educated Patient    Methods Explanation;Demonstration;Verbal cues    Comprehension Verbalized understanding;Returned demonstration              PT Short Term Goals - 12/06/20 1501       PT SHORT TERM GOAL #1   Title Patient will be independent in home exercise program to improve strength/mobility for better functional independence with ADLs.    Baseline patient is compliant with all his HEP; attending water aerobics on days he's not coming to therapy.    Time 4    Period Weeks    Status Achieved    Target Date 08/03/20      PT SHORT TERM GOAL #2   Title Patient will increase RLE gross strength to 4+/5 as to improve functional strength for independent gait, increased standing tolerance and increased ADL ability.    Baseline 12/21: RLE gross strength 4/5 2/23: see note; 4/11: grossly 5/5 BLEs    Time 4    Period Weeks    Status On-going    Target Date 08/03/20               PT Long Term Goals - 12/29/20 1451       PT LONG TERM GOAL #1   Title Patient will increase FOTO score to equal to or greater than 67 to demonstrate statistically significant improvement in  mobility and quality of life.    Baseline 12/21: 57 2/23: 74%, 5/23: 55%, 6/15: 57%    Time 8    Period Weeks    Status Not Met    Target Date 02/01/21      PT LONG TERM GOAL #2   Title Patient (> 98 years old) will complete five times sit to stand test in <12 seconds indicating an increased LE strength and improved balance.    Baseline 12/21: 17.65s, 01/26: 14.47s; 3/16: 13.17sec; 4/11: 8.5 sec, 5/23: 15.25 sec, 6/15: 8.25 sec with arms across chest;    Time 8    Period Weeks    Status Achieved    Target Date 02/01/21      PT LONG TERM GOAL #3  Title Patient will increase six minute walk test distance to >1000 for progression to community ambulator and improve gait ability    Baseline 12/21: 500, 01/26: 655 ft 2/23: 735 ft; 3/16: 652f; 4/11: 737 ft, 6/15: 830 feet    Time 8    Period Weeks    Status On-going    Target Date 02/01/21      PT LONG TERM GOAL #4   Title Patient will reduce timed up and go to <11 seconds to reduce fall risk and demonstrate improved transfer/gait ability.    Baseline 12/21: 16.78s, 01/26: 12.08s 2/23: 10 sec, 5/23: 10 sec, 6/15: 8.8 sec    Time 8    Period Weeks    Status Achieved    Target Date 02/01/21      PT LONG TERM GOAL #5   Title Patient will increase lower extremity functional scale to >60/80 to demonstrate improved functional mobility and increased tolerance with ADLs.    Baseline 12/21: 50/80, 01/26: 62/80, 6/15:61/80    Time 8    Period Weeks    Status Achieved    Target Date 02/01/21                   Plan - 01/24/21 1721     Clinical Impression Statement Pt continues to be highly motivated to participate in session. Pt was able to progress in both volume of reps and level of resistance used with therex. The pt had no pain with any of the exercises performed. He is still most challenged with self- correcting gait-mechanics in order to improve trendelenburg gait and arm-swing. The pt will continue to benefit from skilled PT  intervention to improve LE strength, gait, balance and mobility.    Personal Factors and Comorbidities Comorbidity 3+;Time since onset of injury/illness/exacerbation    Comorbidities diabetes, hypertension, hyperlipidemia    Examination-Activity Limitations Squat;Stairs;Transfers    Examination-Participation Restrictions [place]    Stability/Clinical Decision Making Evolving/Moderate complexity    Rehab Potential Fair    PT Frequency 2x / week    PT Duration 8 weeks    PT Treatment/Interventions Moist Heat;Electrical Stimulation;Gait training;Stair training;Functional mobility training;Therapeutic activities;Therapeutic exercise;Balance training;Neuromuscular re-education;Patient/family education;Manual techniques;Passive range of motion;Energy conservation    PT Next Visit Plan Advance multiplanar, multi surface balance training; Right hip abduction, ER strength; assess mm length LEs, continue LE stretching and address back mobility    PT Home Exercise Plan no updates    Consulted and Agree with Plan of Care Patient             Patient will benefit from skilled therapeutic intervention in order to improve the following deficits and impairments:  Abnormal gait, Decreased endurance, Impaired sensation, Decreased activity tolerance, Decreased strength, Decreased balance, Decreased mobility, Difficulty walking, Decreased range of motion, Decreased safety awareness, Improper body mechanics  Visit Diagnosis: Other abnormalities of gait and mobility  Muscle weakness (generalized)  Other lack of coordination  Unsteadiness on feet   Problem List There are no problems to display for this patient.  HRicard DillonPT, DPT 01/24/2021, 5:23 PM  [place]MAIN RFloyd [facility] CenterSERVICES [address] [place] [address]Phone: [phone]  Fax:  [phone] Name: WRavin DenardoMRN: [id number] of Birth: 802/02/1941

## 2021-01-26 ENCOUNTER — Ambulatory Visit: Payer: Medicare Other

## 2021-01-26 ENCOUNTER — Other Ambulatory Visit: Payer: Self-pay

## 2021-01-26 DIAGNOSIS — R2689 Other abnormalities of gait and mobility: Secondary | ICD-10-CM

## 2021-01-26 DIAGNOSIS — M6281 Muscle weakness (generalized): Secondary | ICD-10-CM

## 2021-01-26 DIAGNOSIS — R2681 Unsteadiness on feet: Secondary | ICD-10-CM

## 2021-01-26 NOTE — Therapy (Signed)
[place] MAIN [facility] SERVICES [address] [place], [place], [address] Phone: [phone]   Fax:  [phone]  Physical Therapy Treatment  Patient Details  Name: Michael Cohen MRN: [id number] Date of Birth: July 15, 1941 Referring Provider (PT): [name]   Encounter Date: 01/26/2021   PT End of Session - 01/26/21 1441     Visit Number 45    Number of Visits 61    Date for PT Re-Evaluation 02/01/21    Authorization Type Medicare Parts A & B    Authorization Time Period Cert 1/77-9/39    PT Start Time 1344    PT Stop Time 1430    PT Time Calculation (min) 46 min    Equipment Utilized During Treatment Gait belt    Activity Tolerance Patient tolerated treatment well    Behavior During Therapy [facility] for tasks assessed/performed             Past Medical History:  Diagnosis Date   Diabetes mellitus without complication ([place])    Hypercholesteremia    Hypertension     Past Surgical History:  Procedure Laterality Date   CATARACT EXTRACTION Bilateral    COLONOSCOPY WITH PROPOFOL N/A 12/12/2017   Procedure: COLONOSCOPY WITH PROPOFOL;  Surgeon: [name], [name], MD;  Location: [facility] ENDOSCOPY;  Service: Gastroenterology;  Laterality: N/A;   EYE SURGERY      There were no vitals filed for this visit.   Subjective Assessment - 01/26/21 1342     Subjective No pain currently. Pt has been walking his dog 3x/day.    Pertinent History 80 y.o. 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 His pain began around a month ago and is located over the proximal lateral thigh and in the right buttocks. He describes his pain as achy more than sharp. He is not sure of any aggravating activities. He reports of having some pain at rest today. He reports associated pain or "tightness" felt along anterior and lateral portions of the lower leg. He was previously evaluated by [name] in podiatry and  diagnosed with tibialis tendinitis. He denies associated groin pain, denies radicular symptoms. He has tried Baclofen without any relief of his symptoms. Of note, the patient states that he was scheduled to go on a cruise this week but was canceled due to the pain he is experiencing. He states that he would be unable to walk around and participate fully in his vacation given his pain. An anteroposterior view of the pelvis and anteroposterior and lateral views of the right hip were obtained. Images reveal mild loss of femoral acetabular joint space with mild osteophyte formation noted. CAM deformity of the right hip noted. No fractures or dislocations. Anteroposterior, oblique, and lateral views of the lumbar spine were obtained. Images reveal no scoliosis. Mild loss of lumbar lordosis noted. Significant loss of disc space noted at L4-L5 and L5-S1. Intervertebral osteophyte formation of L4 and L5. No fractures or wedging noted. Calcification of the abdominal aorta noted. Degeneration of the lumbar facets noted. Patient was recently diagnosed with lumbar radiculoapthy due to symptoms being more consistent vs hip pathology given the absence of hip symptoms with provocative maneuvers. Prednisone tapers have been considered but is on hold for now due to patient's diabetes.    Currently in Pain? No/denies              TREATMENT:   THEREX-   Nustep level 2-3 x  6 minutes (seat 10, arms 9) (unbilled)   Stepping over orange hurdle forward/backward, side-to-side - multiple reps of each. Pt knocks into hurdle 2-3 times, but no LOB.  Seated piriformis stretch - 2x30-75 sec each LE *some pain on the R, improves with reducing ROM of stretch   Sit<>Stand with arms across chest 2x10,1x5 reps; rates medium, a few instances of posterior LOB, use of CGA, UE assist to regain balance    Standing hip abduction - 2x10-11 BLEs; pt rates easy. No pain with exercise. Progressed to 2# for 1x10 BLEs.  Walking marches  with 2# AW length of bars - x multiple reps.Cuing to increase AROM.   Laps around PT gym, pt ambulating with PT providing cues for increased step-length B to improve trendelenburg gait and cadence while wearing 2# AW on BLEs. Pt intermittently able to self-correct.     Pt educated throughout session about proper posture and technique with exercises. Improved exercise technique, movement at target joints, use of target muscles after min to mod verbal, visual, tactile cues.      PT Education - 01/26/21 1442     Education Details exercise technique, body mechanics    Person(s) Educated Patient    Methods Explanation;Demonstration;Verbal cues    Comprehension Returned demonstration;Verbalized understanding              PT Short Term Goals - 12/06/20 1501       PT SHORT TERM GOAL #1   Title Patient will be independent in home exercise program to improve strength/mobility for better functional independence with ADLs.    Baseline patient is compliant with all his HEP; attending water aerobics on days he's not coming to therapy.    Time 4    Period Weeks    Status Achieved    Target Date 08/03/20      PT SHORT TERM GOAL #2   Title Patient will increase RLE gross strength to 4+/5 as to improve functional strength for independent gait, increased standing tolerance and increased ADL ability.    Baseline 12/21: RLE gross strength 4/5 2/23: see note; 4/11: grossly 5/5 BLEs    Time 4    Period Weeks    Status On-going    Target Date 08/03/20               PT Long Term Goals - 12/29/20 1451       PT LONG TERM GOAL #1   Title Patient will increase FOTO score to equal to or greater than 67 to demonstrate statistically significant improvement in mobility and quality of life.    Baseline 12/21: 57 2/23: 74%, 5/23: 55%, 6/15: 57%    Time 8    Period Weeks    Status Not Met    Target Date 02/01/21      PT LONG TERM GOAL #2   Title Patient (> 65 years old) will complete five  times sit to stand test in <12 seconds indicating an increased LE strength and improved balance.    Baseline 12/21: 17.65s, 01/26: 14.47s; 3/16: 13.17sec; 4/11: 8.5 sec, 5/23: 15.25 sec, 6/15: 8.25 sec with arms across chest;    Time 8    Period Weeks    Status Achieved    Target Date 02/01/21      PT LONG TERM GOAL #3   Title Patient will increase six minute walk test distance to >1000 for progression to community ambulator and improve gait ability    Baseline 12/21: 500, 01/26: 655 ft  2/23: 735 ft; 3/16: 610f; 4/11: 737 ft, 6/15: 830 feet    Time 8    Period Weeks    Status On-going    Target Date 02/01/21      PT LONG TERM GOAL #4   Title Patient will reduce timed up and go to <11 seconds to reduce fall risk and demonstrate improved transfer/gait ability.    Baseline 12/21: 16.78s, 01/26: 12.08s 2/23: 10 sec, 5/23: 10 sec, 6/15: 8.8 sec    Time 8    Period Weeks    Status Achieved    Target Date 02/01/21      PT LONG TERM GOAL #5   Title Patient will increase lower extremity functional scale to >60/80 to demonstrate improved functional mobility and increased tolerance with ADLs.    Baseline 12/21: 50/80, 01/26: 62/80, 6/15:61/80    Time 8    Period Weeks    Status Achieved    Target Date 02/01/21                   Plan - 01/26/21 1438     Clinical Impression Statement Pt with initial difficulty performing STS this session with posterior LOB where PT provided CGA and pt used UE support to regain balance. Pt did show improvement with reps, however. Pt with no pain with majority of therex, but did report some discomfort/minor pain with seated piriformis stretch on R. PT cued pt to reduce ROM of stretch and pt reported improvement in sx. PT instructed pt to perform exercises and stretching only within pain-free range and pt verbalized understanding. The pt will benefit from futher skilled PT to improve LE strength, gait, mobility and balance to improve QOL.    Personal  Factors and Comorbidities Comorbidity 3+;Time since onset of injury/illness/exacerbation    Comorbidities diabetes, hypertension, hyperlipidemia    Examination-Activity Limitations Squat;Stairs;Transfers    Examination-Participation Restrictions Church    Stability/Clinical Decision Making Evolving/Moderate complexity    Rehab Potential Fair    PT Frequency 2x / week    PT Duration 8 weeks    PT Treatment/Interventions Moist Heat;Electrical Stimulation;Gait training;Stair training;Functional mobility training;Therapeutic activities;Therapeutic exercise;Balance training;Neuromuscular re-education;Patient/family education;Manual techniques;Passive range of motion;Energy conservation    PT Next Visit Plan Advance multiplanar, multi surface balance training; Right hip abduction, ER strength; assess mm length LEs, continue LE stretching and address back mobility, continue POC as previously indicated    PT Home Exercise Plan no updates    Consulted and Agree with Plan of Care Patient             Patient will benefit from skilled therapeutic intervention in order to improve the following deficits and impairments:  Abnormal gait, Decreased endurance, Impaired sensation, Decreased activity tolerance, Decreased strength, Decreased balance, Decreased mobility, Difficulty walking, Decreased range of motion, Decreased safety awareness, Improper body mechanics  Visit Diagnosis: Other abnormalities of gait and mobility  Muscle weakness (generalized)  Unsteadiness on feet     Problem List There are no problems to display for this patient.  HRicard DillonPT, DPT  01/26/2021, 2:45 PM  [facility] [address] [place] [address]Phone: [phone]  Fax:  [phone] Name: WNarada UzzleMRN: [id number] of Birth: [birth year]

## 2021-01-31 ENCOUNTER — Ambulatory Visit: Payer: Medicare Other

## 2021-01-31 ENCOUNTER — Other Ambulatory Visit: Payer: Self-pay

## 2021-01-31 DIAGNOSIS — R2689 Other abnormalities of gait and mobility: Secondary | ICD-10-CM

## 2021-01-31 DIAGNOSIS — R278 Other lack of coordination: Secondary | ICD-10-CM

## 2021-01-31 DIAGNOSIS — M6281 Muscle weakness (generalized): Secondary | ICD-10-CM

## 2021-01-31 NOTE — Therapy (Signed)
[place] MAIN [facility] SERVICES [address] [place], [place], [address] Phone: [phone]   Fax:  [phone]  Physical Therapy Treatment/RECERT  Patient Details  Name: Michael Cohen MRN: [id number] Date of Birth: 1941-01-14 Referring Provider (PT): [name]   Encounter Date: 01/31/2021   PT End of Session - 01/31/21 1437     Visit Number 69    Number of Visits 40    Date for PT Re-Evaluation 02/01/21    Authorization Type Medicare Parts A & B    Authorization Time Period Cert 1/68-3/72    PT Start Time 1346    PT Stop Time 1429    PT Time Calculation (min) 43 min    Equipment Utilized During Treatment Gait belt    Activity Tolerance Patient tolerated treatment well    Behavior During Therapy [facility] for tasks assessed/performed             Past Medical History:  Diagnosis Date   Diabetes mellitus without complication ([place])    Hypercholesteremia    Hypertension     Past Surgical History:  Procedure Laterality Date   CATARACT EXTRACTION Bilateral    COLONOSCOPY WITH PROPOFOL N/A 12/12/2017   Procedure: COLONOSCOPY WITH PROPOFOL;  Surgeon: [name], [name], MD;  Location: [facility] ENDOSCOPY;  Service: Gastroenterology;  Laterality: N/A;   EYE SURGERY      There were no vitals filed for this visit.   Subjective Assessment - 01/31/21 1436     Subjective Pt reports no pain currently. No LOB or near-falls since previous session reported. He walked his dog prior to PT.    Pertinent History 80 y.o. 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 His pain began around a month ago and is located over the proximal lateral thigh and in the right buttocks. He describes his pain as achy more than sharp. He is not sure of any aggravating activities. He reports of having some pain at rest today. He reports associated pain or "tightness" felt along anterior and lateral portions of the  lower leg. He was previously evaluated by [name] in podiatry and diagnosed with tibialis tendinitis. He denies associated groin pain, denies radicular symptoms. He has tried Baclofen without any relief of his symptoms. Of note, the patient states that he was scheduled to go on a cruise this week but was canceled due to the pain he is experiencing. He states that he would be unable to walk around and participate fully in his vacation given his pain. An anteroposterior view of the pelvis and anteroposterior and lateral views of the right hip were obtained. Images reveal mild loss of femoral acetabular joint space with mild osteophyte formation noted. CAM deformity of the right hip noted. No fractures or dislocations. Anteroposterior, oblique, and lateral views of the lumbar spine were obtained. Images reveal no scoliosis. Mild loss of lumbar lordosis noted. Significant loss of disc space noted at L4-L5 and L5-S1. Intervertebral osteophyte formation of L4 and L5. No fractures or wedging noted. Calcification of the abdominal aorta noted. Degeneration of the lumbar facets noted. Patient was recently diagnosed with lumbar radiculoapthy due to symptoms being more consistent vs hip pathology given the absence of hip symptoms with provocative maneuvers. Prednisone tapers have been considered but is on hold for now due to patient's diabetes.    Currently in Pain? No/denies           TREATMENT:  THEREX-  RETESTING FOR RECERT  FOTO: 70  6MWT: 810 ft  MMT: BLES grossly 5/5  Forward/backward and side-to-side stepping over hurdle - x multiple reps for each. UE support utilized. Pt knocks over hurdle 1-2x.  Sit<>Stand with arms across chest 3x10; rates medium   Standing hip abduction - 3x15. Fatigues.   Laps around PT gym for continued LE mm endurance and with cuing for gait technique to improve distance ambulated;  PT providing cues for increased step-length of LLE to improve trendelenburg gait and  cadence. Pt continues to be able to self-correct intermittently.     Pt educated throughout session about proper posture and technique with exercises. Improved exercise technique, movement at target joints, use of target muscles after min to mod verbal, visual, tactile cues. PT also instructs pt on indications of goal reassessment for POC.     PT Education - 01/31/21 1436     Education Details exercise technique, body mechanics    Person(s) Educated Patient    Methods Demonstration;Verbal cues;Explanation    Comprehension Verbalized understanding;Returned demonstration              PT Short Term Goals - 01/31/21 1351       PT SHORT TERM GOAL #1   Title Patient will be independent in home exercise program to improve strength/mobility for better functional independence with ADLs.    Baseline patient is compliant with all his HEP; attending water aerobics on days he's not coming to therapy.    Time 4    Period Weeks    Status Achieved    Target Date 08/03/20      PT SHORT TERM GOAL #2   Title Patient will increase RLE gross strength to 4+/5 as to improve functional strength for independent gait, increased standing tolerance and increased ADL ability.    Baseline 12/21: RLE gross strength 4/5 2/23: see note; 4/11: grossly 5/5 BLEs; 7/18: grossly 5/5 BLEs    Time 4    Period Weeks    Status Achieved    Target Date 08/03/20               PT Long Term Goals - 01/31/21 1353       PT LONG TERM GOAL #1   Title Patient will increase FOTO score to equal to or greater than 67 to demonstrate statistically significant improvement in mobility and quality of life.    Baseline 12/21: 57 2/23: 74%, 5/23: 55%, 6/15: 57%; 7/18: 70%    Time 8    Period Weeks    Status Not Met    Target Date 03/28/21      PT LONG TERM GOAL #2   Title Patient (> 59 years old) will complete five times sit to stand test in <12 seconds indicating an increased LE strength and improved balance.     Baseline 12/21: 17.65s, 01/26: 14.47s; 3/16: 13.17sec; 4/11: 8.5 sec, 5/23: 15.25 sec, 6/15: 8.25 sec with arms across chest;    Time 8    Period Weeks    Status Achieved      PT LONG TERM GOAL #3   Title Patient will increase six minute walk test distance to >1000 for progression to community ambulator and improve gait ability    Baseline 12/21: 500, 01/26: 655 ft 2/23: 735 ft; 3/16: 630f; 4/11: 737 ft, 6/15: 830 feet; 7/18: 810 ft    Time 8    Period Weeks    Status On-going    Target Date 03/28/21  PT LONG TERM GOAL #4   Title Patient will reduce timed up and go to <11 seconds to reduce fall risk and demonstrate improved transfer/gait ability.    Baseline 12/21: 16.78s, 01/26: 12.08s 2/23: 10 sec, 5/23: 10 sec, 6/15: 8.8 sec    Time 8    Period Weeks    Status Achieved      PT LONG TERM GOAL #5   Title Patient will increase lower extremity functional scale to >60/80 to demonstrate improved functional mobility and increased tolerance with ADLs.    Baseline 12/21: 50/80, 01/26: 62/80, 6/15:61/80    Time 8    Period Weeks    Status Achieved                   Plan - 01/31/21 1438     Clinical Impression Statement Goals retested for RECERT. Pt shows improvement with FOTO score (70%), indicating improvement in perception of functional mobility and QOL. Pt has maintained LE strength where BLEs are grossly 5/5. Although pt making gains, he did have slight decrease in 6MWT: 810 ft today, whereas he ambulated 830 ft prior. The pt still requires frequent cuing for improvement in heel-strike and step-length of LLE. And while pt MMT strengthis 5/5, he exhibits poor muscular endurance with therex. The pt will benefit from further skilled PT to continue to improve overall functional mobility, LE mm endurance, gait ability and gait mechanics in order to increase QOL.    Personal Factors and Comorbidities Comorbidity 3+;Time since onset of injury/illness/exacerbation    Comorbidities  diabetes, hypertension, hyperlipidemia    Examination-Activity Limitations Squat;Stairs;Transfers    Examination-Participation Restrictions Church    Stability/Clinical Decision Making Evolving/Moderate complexity    Rehab Potential Fair    PT Frequency 2x / week    PT Duration 8 weeks    PT Treatment/Interventions Moist Heat;Electrical Stimulation;Gait training;Stair training;Functional mobility training;Therapeutic activities;Therapeutic exercise;Balance training;Neuromuscular re-education;Patient/family education;Manual techniques;Passive range of motion;Energy conservation    PT Next Visit Plan Advance multiplanar, multi surface balance training; Right hip abduction, ER strength; assess mm length LEs, continue LE stretching and address back mobility, continue POC as previously indicated    PT Home Exercise Plan no updates    Consulted and Agree with Plan of Care Patient             Patient will benefit from skilled therapeutic intervention in order to improve the following deficits and impairments:  Abnormal gait, Decreased endurance, Impaired sensation, Decreased activity tolerance, Decreased strength, Decreased balance, Decreased mobility, Difficulty walking, Decreased range of motion, Decreased safety awareness, Improper body mechanics  Visit Diagnosis: Other abnormalities of gait and mobility  Muscle weakness (generalized)  Other lack of coordination     Problem List There are no problems to display for this patient.  Ricard Dillon PT, DPT 01/31/2021, 2:46 PM  [place] MAIN [facility] SERVICES [address] [place], [place], [address] Phone: [phone]   Fax:  [phone]  Name: Michael Cohen MRN: [id number] Date of Birth: [birth year]

## 2021-02-02 ENCOUNTER — Ambulatory Visit: Payer: Medicare Other

## 2021-02-02 ENCOUNTER — Other Ambulatory Visit: Payer: Self-pay

## 2021-02-02 DIAGNOSIS — R2689 Other abnormalities of gait and mobility: Secondary | ICD-10-CM

## 2021-02-02 DIAGNOSIS — M6281 Muscle weakness (generalized): Secondary | ICD-10-CM

## 2021-02-02 DIAGNOSIS — R278 Other lack of coordination: Secondary | ICD-10-CM

## 2021-02-02 NOTE — Therapy (Signed)
[place] MAIN [facility] SERVICES [address] [place], [place], [address] Phone: [phone]   Fax:  [phone]  Physical Therapy Treatment  Patient Details  Name: Michael Cohen MRN: [id number] Date of Birth: 09-Apr-1941 Referring Provider (PT): [name]   Encounter Date: 02/02/2021   PT End of Session - 02/02/21 1644     Visit Number 47    Number of Visits 68    Date for PT Re-Evaluation 03/28/21    Authorization Type Medicare Parts A & B    Authorization Time Period Cert 5/46-2/70    PT Start Time 1352    PT Stop Time 1431    PT Time Calculation (min) 39 min    Equipment Utilized During Treatment Gait belt    Activity Tolerance Patient tolerated treatment well    Behavior During Therapy [facility] for tasks assessed/performed             Past Medical History:  Diagnosis Date   Diabetes mellitus without complication ([place])    Hypercholesteremia    Hypertension     Past Surgical History:  Procedure Laterality Date   CATARACT EXTRACTION Bilateral    COLONOSCOPY WITH PROPOFOL N/A 12/12/2017   Procedure: COLONOSCOPY WITH PROPOFOL;  Surgeon: [name], [name], MD;  Location: [facility] ENDOSCOPY;  Service: Gastroenterology;  Laterality: N/A;   EYE SURGERY      There were no vitals filed for this visit.   Subjective Assessment - 02/02/21 1643     Subjective Pt continues to report no more pain in R hip. Pt is aware is walking mechanics are still off.    Pertinent History 80 y.o. 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 His pain began around a month ago and is located over the proximal lateral thigh and in the right buttocks. He describes his pain as achy more than sharp. He is not sure of any aggravating activities. He reports of having some pain at rest today. He reports associated pain or "tightness" felt along anterior and lateral portions of the lower leg. He was previously  evaluated by [name] in podiatry and diagnosed with tibialis tendinitis. He denies associated groin pain, denies radicular symptoms. He has tried Baclofen without any relief of his symptoms. Of note, the patient states that he was scheduled to go on a cruise this week but was canceled due to the pain he is experiencing. He states that he would be unable to walk around and participate fully in his vacation given his pain. An anteroposterior view of the pelvis and anteroposterior and lateral views of the right hip were obtained. Images reveal mild loss of femoral acetabular joint space with mild osteophyte formation noted. CAM deformity of the right hip noted. No fractures or dislocations. Anteroposterior, oblique, and lateral views of the lumbar spine were obtained. Images reveal no scoliosis. Mild loss of lumbar lordosis noted. Significant loss of disc space noted at L4-L5 and L5-S1. Intervertebral osteophyte formation of L4 and L5. No fractures or wedging noted. Calcification of the abdominal aorta noted. Degeneration of the lumbar facets noted. Patient was recently diagnosed with lumbar radiculoapthy due to symptoms being more consistent vs hip pathology given the absence of hip symptoms with provocative maneuvers. Prednisone tapers have been considered but is on hold for now due to patient's diabetes.    Currently in Pain? No/denies            TREATMENT:  THEREX-    Treadmill LE and cardioresp. Endurance training with additional gait analysis- 5.5 min, ambulates >1 mph, HR reaches 88-90 bpm. -Pt exhibits shorter step length and stance time on LLE compared to R with treadmill gait analysis. Reports no pain.  Laps around PT gym, (4x where 1 lap =148 ft) pt ambulating with 3# AW on BLEs and PT providing cues for increased step-length, improved arm-swing B to improve trendelenburg gait and cadence. Pt continues to self-correct intermittently, does exhibit streaks of multiple steps with improved  cadence.  Seated piriformis stretch - 2x60 sec each LE; some pain on the R (2/10).  Forward/backward step-ups and downs on 6" box - x several reps for each, alternating lead LE, with 3# AW on BLEs.  Lateral step-ups and step-downs, 3# AW on BLEs - x several reps   Sit<>Stand with arms across chest - 2x15,     Pt educated throughout session about proper posture and technique with exercises. Improved exercise technique, movement at target joints, use of target muscles after min to mod verbal, visual, tactile cues.    PT Education - 02/02/21 1644     Education Details exercise technique, body mechanics with step-ups    Person(s) Educated Patient    Methods Explanation;Demonstration;Verbal cues    Comprehension Returned demonstration;Verbalized understanding              PT Short Term Goals - 01/31/21 1351       PT SHORT TERM GOAL #1   Title Patient will be independent in home exercise program to improve strength/mobility for better functional independence with ADLs.    Baseline patient is compliant with all his HEP; attending water aerobics on days he's not coming to therapy.    Time 4    Period Weeks    Status Achieved    Target Date 08/03/20      PT SHORT TERM GOAL #2   Title Patient will increase RLE gross strength to 4+/5 as to improve functional strength for independent gait, increased standing tolerance and increased ADL ability.    Baseline 12/21: RLE gross strength 4/5 2/23: see note; 4/11: grossly 5/5 BLEs; 7/18: grossly 5/5 BLEs    Time 4    Period Weeks    Status Achieved    Target Date 08/03/20               PT Long Term Goals - 01/31/21 1353       PT LONG TERM GOAL #1   Title Patient will increase FOTO score to equal to or greater than 67 to demonstrate statistically significant improvement in mobility and quality of life.    Baseline 12/21: 57 2/23: 74%, 5/23: 55%, 6/15: 57%; 7/18: 70%    Time 8    Period Weeks    Status Not Met    Target Date  03/28/21      PT LONG TERM GOAL #2   Title Patient (> 39 years old) will complete five times sit to stand test in <12 seconds indicating an increased LE strength and improved balance.    Baseline 12/21: 17.65s, 01/26: 14.47s; 3/16: 13.17sec; 4/11: 8.5 sec, 5/23: 15.25 sec, 6/15: 8.25 sec with arms across chest;    Time 8    Period Weeks    Status Achieved      PT LONG TERM GOAL #3   Title Patient will increase six minute walk test distance to >1000 for progression to community ambulator and improve gait ability    Baseline  12/21: 500, 01/26: 655 ft 2/23: 735 ft; 3/16: 650f; 4/11: 737 ft, 6/15: 830 feet; 7/18: 810 ft    Time 8    Period Weeks    Status On-going    Target Date 03/28/21      PT LONG TERM GOAL #4   Title Patient will reduce timed up and go to <11 seconds to reduce fall risk and demonstrate improved transfer/gait ability.    Baseline 12/21: 16.78s, 01/26: 12.08s 2/23: 10 sec, 5/23: 10 sec, 6/15: 8.8 sec    Time 8    Period Weeks    Status Achieved      PT LONG TERM GOAL #5   Title Patient will increase lower extremity functional scale to >60/80 to demonstrate improved functional mobility and increased tolerance with ADLs.    Baseline 12/21: 50/80, 01/26: 62/80, 6/15:61/80    Time 8    Period Weeks    Status Achieved                   Plan - 02/02/21 1648     Clinical Impression Statement Gait analysis completed on treadmill on this date. Pt with shorter step-length and stance time on LLE compared to R. With cuing pt was able to self-correct, but at times adjusted to showing decreased step length on R compared to L. Pt was able to progress with level of weight used with therex today, indicating improved BLE strength. The pt will benefit from further skilled PT to improve strength, functional mobility, endurance and gait mechanics to increase ease and safety with ADLs.    Personal Factors and Comorbidities Comorbidity 3+;Time since onset of  injury/illness/exacerbation    Comorbidities diabetes, hypertension, hyperlipidemia    Examination-Activity Limitations Squat;Stairs;Transfers    Examination-Participation Restrictions [employer]    Stability/Clinical Decision Making Evolving/Moderate complexity    Rehab Potential Fair    PT Frequency 2x / week    PT Duration 8 weeks    PT Treatment/Interventions Moist Heat;Electrical Stimulation;Gait training;Stair training;Functional mobility training;Therapeutic activities;Therapeutic exercise;Balance training;Neuromuscular re-education;Patient/family education;Manual techniques;Passive range of motion;Energy conservation    PT Next Visit Plan Advance multiplanar, multi surface balance training; Right hip abduction, ER strength; assess mm length LEs, continue LE stretching and address back mobility, continue POC as previously indicated    PT Home Exercise Plan no updates    Consulted and Agree with Plan of Care Patient             Patient will benefit from skilled therapeutic intervention in order to improve the following deficits and impairments:  Abnormal gait, Decreased endurance, Impaired sensation, Decreased activity tolerance, Decreased strength, Decreased balance, Decreased mobility, Difficulty walking, Decreased range of motion, Decreased safety awareness, Improper body mechanics  Visit Diagnosis: Muscle weakness (generalized)  Other abnormalities of gait and mobility  Other lack of coordination     Problem List There are no problems to display for this patient.  HRicard DillonPT, DPT 02/02/2021, 4:51 PM  [place]MAIN [facility]rySERVICES [address] [address]Phone: [phone]  Fax:  [phone] Name: Michael CuttinoMRN: [id number] of Birth: 809/05/1941

## 2021-02-07 ENCOUNTER — Ambulatory Visit: Payer: Medicare Other

## 2021-02-09 ENCOUNTER — Ambulatory Visit: Payer: Medicare Other

## 2021-02-14 ENCOUNTER — Ambulatory Visit: Payer: Medicare Other

## 2021-02-16 ENCOUNTER — Ambulatory Visit: Payer: Medicare Other | Attending: Internal Medicine

## 2021-02-16 ENCOUNTER — Other Ambulatory Visit: Payer: Self-pay

## 2021-02-16 ENCOUNTER — Ambulatory Visit: Payer: Medicare Other

## 2021-02-16 DIAGNOSIS — R278 Other lack of coordination: Secondary | ICD-10-CM | POA: Insufficient documentation

## 2021-02-16 DIAGNOSIS — R2681 Unsteadiness on feet: Secondary | ICD-10-CM | POA: Diagnosis present

## 2021-02-16 DIAGNOSIS — M6281 Muscle weakness (generalized): Secondary | ICD-10-CM | POA: Insufficient documentation

## 2021-02-16 DIAGNOSIS — M25551 Pain in right hip: Secondary | ICD-10-CM | POA: Insufficient documentation

## 2021-02-16 DIAGNOSIS — R269 Unspecified abnormalities of gait and mobility: Secondary | ICD-10-CM | POA: Diagnosis present

## 2021-02-16 DIAGNOSIS — R2689 Other abnormalities of gait and mobility: Secondary | ICD-10-CM | POA: Diagnosis not present

## 2021-02-16 NOTE — Therapy (Signed)
[place] MAIN [facility] SERVICES [address] [place], [place], [address] Phone: [phone]   Fax:  [phone]  Physical Therapy Treatment  Patient Details  Name: Michael Cohen MRN: [id number] Date of Birth: 07-31-1940 Referring Provider (PT): [name]   Encounter Date: 02/16/2021   PT End of Session - 02/16/21 1354     Visit Number 48    Number of Visits 70    Date for PT Re-Evaluation 03/28/21    Authorization Type Medicare Parts A & B    Authorization Time Period Cert 6/38-4/66    PT Start Time 1306    PT Stop Time 1345    PT Time Calculation (min) 39 min    Equipment Utilized During Treatment Gait belt    Activity Tolerance Patient tolerated treatment well    Behavior During Therapy [facility] for tasks assessed/performed             Past Medical History:  Diagnosis Date   Diabetes mellitus without complication ([place])    Hypercholesteremia    Hypertension     Past Surgical History:  Procedure Laterality Date   CATARACT EXTRACTION Bilateral    COLONOSCOPY WITH PROPOFOL N/A 12/12/2017   Procedure: COLONOSCOPY WITH PROPOFOL;  Surgeon: [name], [name], MD;  Location: [facility] ENDOSCOPY;  Service: Gastroenterology;  Laterality: N/A;   EYE SURGERY      There were no vitals filed for this visit.  Subjective Assessment - 02/16/21 1305     Subjective Pt reports he has been practicing taking longer steps. Pt reports last week his son and grandkid visited. Pt plans to go back to working out in the pool. Pt reports no pain. Pt reports no falls or near-falls.    Pertinent History 80 y.o. 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 His pain began around a month ago and is located over the proximal lateral thigh and in the right buttocks. He describes his pain as achy more than sharp. He is not sure of any aggravating activities. He reports of having some pain at rest today. He  reports associated pain or "tightness" felt along anterior and lateral portions of the lower leg. He was previously evaluated by [name] in podiatry and diagnosed with tibialis tendinitis. He denies associated groin pain, denies radicular symptoms. He has tried Baclofen without any relief of his symptoms. Of note, the patient states that he was scheduled to go on a cruise this week but was canceled due to the pain he is experiencing. He states that he would be unable to walk around and participate fully in his vacation given his pain. An anteroposterior view of the pelvis and anteroposterior and lateral views of the right hip were obtained. Images reveal mild loss of femoral acetabular joint space with mild osteophyte formation noted. CAM deformity of the right hip noted. No fractures or dislocations. Anteroposterior, oblique, and lateral views of the lumbar spine were obtained. Images reveal no scoliosis. Mild loss of lumbar lordosis noted. Significant loss of disc space noted at L4-L5 and L5-S1. Intervertebral osteophyte formation of L4 and L5. No fractures or wedging noted. Calcification of the abdominal aorta noted. Degeneration of the lumbar facets noted. Patient was recently diagnosed with lumbar radiculoapthy due to symptoms being more consistent vs hip pathology given the absence of hip symptoms with provocative maneuvers. Prednisone tapers have been considered but is on hold for now due to patient's diabetes.  Currently in Pain? No/denies              TREATMENT:   THEREX-    Treadmill for LE and cardioresp. endurance training with additional gait training feature to improve step-length and stance time- x6 min, ambulates up to 1.5 mph, HR reaches 90 bpm max.  -Pt continues to exhibit shorter step length and stance time on LLE compared to R with treadmill gait analysis.  Step length exercise performed over 10 meter track Trial 1- 21 steps Trial 2 - 19 steps Trial 3 - 21 steps Trial  4- 20 steps Trial 5- 21 steps  Cuing to increase L step length.   At support surface- With 3# AW on BLEs 10x with each LE for each of the following Stepping over hurdle hedgehog taps Stepping over hurdle cone taps Stepping backward/forward over hurdle  Seated piriformis stretch - 2x30-45 sec each LE; some continued pain on the R that reduced with modification of form  Standing at support surface with CGA With 3# AW on BLEs Standing hip abduction 3x12 Standing hip ext. 3x12     Pt educated throughout session about proper posture and technique with exercises. Improved exercise technique, movement at target joints, use of target muscles after min to mod verbal, visual, tactile cues.   PT Education - 02/16/21 1354     Education Details exercise technique, body mechanics    Person(s) Educated Patient    Methods Explanation;Demonstration;Verbal cues    Comprehension Returned demonstration;Verbalized understanding              PT Short Term Goals - 01/31/21 1351       PT SHORT TERM GOAL #1   Title Patient will be independent in home exercise program to improve strength/mobility for better functional independence with ADLs.    Baseline patient is compliant with all his HEP; attending water aerobics on days he's not coming to therapy.    Time 4    Period Weeks    Status Achieved    Target Date 08/03/20      PT SHORT TERM GOAL #2   Title Patient will increase RLE gross strength to 4+/5 as to improve functional strength for independent gait, increased standing tolerance and increased ADL ability.    Baseline 12/21: RLE gross strength 4/5 2/23: see note; 4/11: grossly 5/5 BLEs; 7/18: grossly 5/5 BLEs    Time 4    Period Weeks    Status Achieved    Target Date 08/03/20               PT Long Term Goals - 01/31/21 1353       PT LONG TERM GOAL #1   Title Patient will increase FOTO score to equal to or greater than 67 to demonstrate statistically significant improvement  in mobility and quality of life.    Baseline 12/21: 57 2/23: 74%, 5/23: 55%, 6/15: 57%; 7/18: 70%    Time 8    Period Weeks    Status Not Met    Target Date 03/28/21      PT LONG TERM GOAL #2   Title Patient (> 22 years old) will complete five times sit to stand test in <12 seconds indicating an increased LE strength and improved balance.    Baseline 12/21: 17.65s, 01/26: 14.47s; 3/16: 13.17sec; 4/11: 8.5 sec, 5/23: 15.25 sec, 6/15: 8.25 sec with arms across chest;    Time 8    Period Weeks    Status Achieved  PT LONG TERM GOAL #3   Title Patient will increase six minute walk test distance to >1000 for progression to community ambulator and improve gait ability    Baseline 12/21: 500, 01/26: 655 ft 2/23: 735 ft; 3/16: 642f; 4/11: 737 ft, 6/15: 830 feet; 7/18: 810 ft    Time 8    Period Weeks    Status On-going    Target Date 03/28/21      PT LONG TERM GOAL #4   Title Patient will reduce timed up and go to <11 seconds to reduce fall risk and demonstrate improved transfer/gait ability.    Baseline 12/21: 16.78s, 01/26: 12.08s 2/23: 10 sec, 5/23: 10 sec, 6/15: 8.8 sec    Time 8    Period Weeks    Status Achieved      PT LONG TERM GOAL #5   Title Patient will increase lower extremity functional scale to >60/80 to demonstrate improved functional mobility and increased tolerance with ADLs.    Baseline 12/21: 50/80, 01/26: 62/80, 6/15:61/80    Time 8    Period Weeks    Status Achieved                Plan - 02/16/21 1355     Clinical Impression Statement Pt motivated throughout session. He continues to demonstrate difficulty with equal step-length, with LLE step-length < RLE step-length. He was able to intermittently correct throughout session with cuing. Pt showed most improvement with exercies involving stepping over hurdle onto hedgehog or cones. The pt will benefit from further skilled PT to improve pain, LE strength, and balance to increase ease with ADLs.    Personal  Factors and Comorbidities Comorbidity 3+;Time since onset of injury/illness/exacerbation    Comorbidities diabetes, hypertension, hyperlipidemia    Examination-Activity Limitations Squat;Stairs;Transfers    Examination-Participation Restrictions [employer]    Stability/Clinical Decision Making Evolving/Moderate complexity    Rehab Potential Fair    PT Frequency 2x / week    PT Duration 8 weeks    PT Treatment/Interventions Moist Heat;Electrical Stimulation;Gait training;Stair training;Functional mobility training;Therapeutic activities;Therapeutic exercise;Balance training;Neuromuscular re-education;Patient/family education;Manual techniques;Passive range of motion;Energy conservation    PT Next Visit Plan Advance multiplanar, multi surface balance training; Right hip abduction, ER strength; assess mm length LEs, continue LE stretching and address back mobility, continue POC as previously indicated    PT Home Exercise Plan no updates    Consulted and Agree with Plan of Care Patient             Patient will benefit from skilled therapeutic intervention in order to improve the following deficits and impairments:  Abnormal gait, Decreased endurance, Impaired sensation, Decreased activity tolerance, Decreased strength, Decreased balance, Decreased mobility, Difficulty walking, Decreased range of motion, Decreased safety awareness, Improper body mechanics  Visit Diagnosis: Other abnormalities of gait and mobility  Muscle weakness (generalized)  Pain in right hip     Problem List There are no problems to display for this patient.  HRicard DillonPT, DPT 02/16/2021, 2:05 PM  CR[place]MAIN RDelmar Surgical Center LLCSERVICES [address]RG[address] [place] [address]Phone: [phone]  Fax:  [phone] Name: WThaxton PelleyMRN: [id number] of Birth: 810/29/1942

## 2021-02-21 ENCOUNTER — Ambulatory Visit: Payer: Medicare Other

## 2021-02-23 ENCOUNTER — Ambulatory Visit: Payer: Medicare Other

## 2021-02-23 ENCOUNTER — Other Ambulatory Visit: Payer: Self-pay

## 2021-02-23 DIAGNOSIS — R2689 Other abnormalities of gait and mobility: Secondary | ICD-10-CM | POA: Diagnosis not present

## 2021-02-23 DIAGNOSIS — M6281 Muscle weakness (generalized): Secondary | ICD-10-CM

## 2021-02-23 DIAGNOSIS — R278 Other lack of coordination: Secondary | ICD-10-CM

## 2021-02-23 NOTE — Therapy (Signed)
[place] MAIN [facility] SERVICES [address] [place], [place], [address] Phone: [phone]   Fax:  [phone]  Physical Therapy Treatment  Patient Details  Name: Michael Cohen MRN: [id number] Date of Birth: January 10, 1941 Referring Provider (PT): [name]   Encounter Date: 02/23/2021   PT End of Session - 02/23/21 1727     Visit Number 32    Number of Visits 29    Date for PT Re-Evaluation 03/28/21    Authorization Type Medicare Parts A & B    Authorization Time Period Cert 5/05-1/83    PT Start Time 1349    PT Stop Time 1430    PT Time Calculation (min) 41 min    Equipment Utilized During Treatment Gait belt    Activity Tolerance Patient tolerated treatment well    Behavior During Therapy [facility] for tasks assessed/performed             Past Medical History:  Diagnosis Date   Diabetes mellitus without complication ([place])    Hypercholesteremia    Hypertension     Past Surgical History:  Procedure Laterality Date   CATARACT EXTRACTION Bilateral    COLONOSCOPY WITH PROPOFOL N/A 12/12/2017   Procedure: COLONOSCOPY WITH PROPOFOL;  Surgeon: [name], [name], MD;  Location: [facility] ENDOSCOPY;  Service: Gastroenterology;  Laterality: N/A;   EYE SURGERY      There were no vitals filed for this visit.   Subjective Assessment - 02/23/21 1726     Subjective Pt continues to report no R hip pain, but does say sometimes R hip aches. He has purchased new insoles for shoes (currently wearing).    Pertinent History 80 y.o. 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 His pain began around a month ago and is located over the proximal lateral thigh and in the right buttocks. He describes his pain as achy more than sharp. He is not sure of any aggravating activities. He reports of having some pain at rest today. He reports associated pain or "tightness" felt along anterior and lateral  portions of the lower leg. He was previously evaluated by [name] in podiatry and diagnosed with tibialis tendinitis. He denies associated groin pain, denies radicular symptoms. He has tried Baclofen without any relief of his symptoms. Of note, the patient states that he was scheduled to go on a cruise this week but was canceled due to the pain he is experiencing. He states that he would be unable to walk around and participate fully in his vacation given his pain. An anteroposterior view of the pelvis and anteroposterior and lateral views of the right hip were obtained. Images reveal mild loss of femoral acetabular joint space with mild osteophyte formation noted. CAM deformity of the right hip noted. No fractures or dislocations. Anteroposterior, oblique, and lateral views of the lumbar spine were obtained. Images reveal no scoliosis. Mild loss of lumbar lordosis noted. Significant loss of disc space noted at L4-L5 and L5-S1. Intervertebral osteophyte formation of L4 and L5. No fractures or wedging noted. Calcification of the abdominal aorta noted. Degeneration of the lumbar facets noted. Patient was recently diagnosed with lumbar radiculoapthy due to symptoms being more consistent vs hip pathology given the absence of hip symptoms with provocative maneuvers. Prednisone tapers have been considered but is on hold for now due to patient's diabetes.    Currently in Pain? No/denies  TREATMENT:   THEREX-   With 3# AW on BLEs: Agility ladder exercise to promote improved/equal step length: Forward/backward stepping, one foot per square - x multiple reps. Pt improves with cues. Agility ladder exercise - side-stepping - 6x back and forth.   Step length exercise performed over 10 meter track Trial 1- 19 steps Trial 2 -18 steps Trial 3 -18 steps Trial 4- 18 steps Trial 5- 17 steps Cuing to increase L step length, however, improved from pervious session.  At support surface- Standing  hip abduction with 3# AW - 3x10 BLEs Stepping forward/backward over hurdle with 3# AW on BLEs- x multiple reps. Still challenging for pt, knocks over hurdle.   Step-ups onto 6" step with 3# AW on BLEs - 15x, alternating leading LE. Standing hip extension with 3# AW on BLEs - 3x12 Standing hip marches with 3# AW on BLEs - 20x    Pt educated throughout session about proper posture and technique with exercises. Improved exercise technique, movement at target joints, use of target muscles after min to mod verbal, visual, tactile cues.    PT Short Term Goals - 01/31/21 1351       PT SHORT TERM GOAL #1   Title Patient will be independent in home exercise program to improve strength/mobility for better functional independence with ADLs.    Baseline patient is compliant with all his HEP; attending water aerobics on days he's not coming to therapy.    Time 4    Period Weeks    Status Achieved    Target Date 08/03/20      PT SHORT TERM GOAL #2   Title Patient will increase RLE gross strength to 4+/5 as to improve functional strength for independent gait, increased standing tolerance and increased ADL ability.    Baseline 12/21: RLE gross strength 4/5 2/23: see note; 4/11: grossly 5/5 BLEs; 7/18: grossly 5/5 BLEs    Time 4    Period Weeks    Status Achieved    Target Date 08/03/20               PT Long Term Goals - 01/31/21 1353       PT LONG TERM GOAL #1   Title Patient will increase FOTO score to equal to or greater than 67 to demonstrate statistically significant improvement in mobility and quality of life.    Baseline 12/21: 57 2/23: 74%, 5/23: 55%, 6/15: 57%; 7/18: 70%    Time 8    Period Weeks    Status Not Met    Target Date 03/28/21      PT LONG TERM GOAL #2   Title Patient (> 8 years old) will complete five times sit to stand test in <12 seconds indicating an increased LE strength and improved balance.    Baseline 12/21: 17.65s, 01/26: 14.47s; 3/16: 13.17sec; 4/11: 8.5  sec, 5/23: 15.25 sec, 6/15: 8.25 sec with arms across chest;    Time 8    Period Weeks    Status Achieved      PT LONG TERM GOAL #3   Title Patient will increase six minute walk test distance to >1000 for progression to community ambulator and improve gait ability    Baseline 12/21: 500, 01/26: 655 ft 2/23: 735 ft; 3/16: 61f; 4/11: 737 ft, 6/15: 830 feet; 7/18: 810 ft    Time 8    Period Weeks    Status On-going    Target Date 03/28/21      PT LONG TERM  GOAL #4   Title Patient will reduce timed up and go to <11 seconds to reduce fall risk and demonstrate improved transfer/gait ability.    Baseline 12/21: 16.78s, 01/26: 12.08s 2/23: 10 sec, 5/23: 10 sec, 6/15: 8.8 sec    Time 8    Period Weeks    Status Achieved      PT LONG TERM GOAL #5   Title Patient will increase lower extremity functional scale to >60/80 to demonstrate improved functional mobility and increased tolerance with ADLs.    Baseline 12/21: 50/80, 01/26: 62/80, 6/15:61/80    Time 8    Period Weeks    Status Achieved              Plan - 02/23/21 1732     Clinical Impression Statement Continued focus on improving gait mechanics with step-length exercises. Pt does exhibit improvement with step-length exercise this session, by requiring fewer steps to ambulate over 10 meter track. Pt stlil very challenged with clearing obstacles. The pt will benefit from further skilled PT to improve BLE strength and gait mechanics in order to increase ease and safety with all functional mobility.    Personal Factors and Comorbidities Comorbidity 3+;Time since onset of injury/illness/exacerbation    Comorbidities diabetes, hypertension, hyperlipidemia    Examination-Activity Limitations Squat;Stairs;Transfers    Examination-Participation Restrictions Church    Stability/Clinical Decision Making Evolving/Moderate complexity    Rehab Potential Fair    PT Frequency 2x / week    PT Duration 8 weeks    PT Treatment/Interventions  Moist Heat;Electrical Stimulation;Gait training;Stair training;Functional mobility training;Therapeutic activities;Therapeutic exercise;Balance training;Neuromuscular re-education;Patient/family education;Manual techniques;Passive range of motion;Energy conservation    PT Next Visit Plan Advance multiplanar, multi surface balance training; Right hip abduction, ER strength; assess mm length LEs, continue LE stretching and address back mobility, continue POC as previously indicated    PT Home Exercise Plan no updates    Consulted and Agree with Plan of Care Patient             Patient will benefit from skilled therapeutic intervention in order to improve the following deficits and impairments:  Abnormal gait, Decreased endurance, Impaired sensation, Decreased activity tolerance, Decreased strength, Decreased balance, Decreased mobility, Difficulty walking, Decreased range of motion, Decreased safety awareness, Improper body mechanics  Visit Diagnosis: Muscle weakness (generalized)  Other abnormalities of gait and mobility  Other lack of coordination     Problem List There are no problems to display for this patient.  Ricard Dillon PT, DPT 02/23/2021, 5:35 PM  [place] MAIN [facility] SERVICES [address] [place], [place], [address] Phone: [phone]   Fax:  [phone]  Name: Michael Cohen MRN: [id number] Date of Birth: [birth year]

## 2021-02-28 ENCOUNTER — Ambulatory Visit: Payer: Medicare Other

## 2021-02-28 ENCOUNTER — Other Ambulatory Visit: Payer: Self-pay

## 2021-02-28 DIAGNOSIS — R278 Other lack of coordination: Secondary | ICD-10-CM

## 2021-02-28 DIAGNOSIS — R2689 Other abnormalities of gait and mobility: Secondary | ICD-10-CM | POA: Diagnosis not present

## 2021-02-28 DIAGNOSIS — M6281 Muscle weakness (generalized): Secondary | ICD-10-CM

## 2021-02-28 NOTE — Therapy (Signed)
[place] MAIN [facility] SERVICES [address] [place], [place], [address] Phone: [phone]   Fax:  [phone]  Physical Therapy Treatment/Physical Therapy Progress Note Visit 50  Dates of reporting period  12/29/2020   to   02/28/2021  Patient Details  Name: Michael Cohen MRN: [id number] Date of Birth: 19-Mar-1941 Referring Provider (PT): [name]   Encounter Date: 02/28/2021   PT End of Session - 03/01/21 1552     Visit Number 50    Number of Visits 66    Date for PT Re-Evaluation 03/28/21    Authorization Type Medicare Parts A & B    Authorization Time Period Cert [id number]    PT Start Time 1402    PT Stop Time 1430    PT Time Calculation (min) 28 min    Equipment Utilized During Treatment Gait belt    Activity Tolerance Patient tolerated treatment well    Behavior During Therapy [facility] for tasks assessed/performed             Past Medical History:  Diagnosis Date   Diabetes mellitus without complication ([place])    Hypercholesteremia    Hypertension     Past Surgical History:  Procedure Laterality Date   CATARACT EXTRACTION Bilateral    COLONOSCOPY WITH PROPOFOL N/A 12/12/2017   Procedure: COLONOSCOPY WITH PROPOFOL;  Surgeon: [name], [name], MD;  Location: [facility] ENDOSCOPY;  Service: Gastroenterology;  Laterality: N/A;   EYE SURGERY      There were no vitals filed for this visit.   Subjective Assessment - 03/01/21 1604     Subjective Pt reports no major changes since previous session.    Pertinent History 80 y.o. 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 His pain began around a month ago and is located over the proximal lateral thigh and in the right buttocks. He describes his pain as achy more than sharp. He is not sure of any aggravating activities. He reports of having some pain at rest today. He reports associated pain or "tightness" felt along anterior  and lateral portions of the lower leg. He was previously evaluated by [name] in podiatry and diagnosed with tibialis tendinitis. He denies associated groin pain, denies radicular symptoms. He has tried Baclofen without any relief of his symptoms. Of note, the patient states that he was scheduled to go on a cruise this week but was canceled due to the pain he is experiencing. He states that he would be unable to walk around and participate fully in his vacation given his pain. An anteroposterior view of the pelvis and anteroposterior and lateral views of the right hip were obtained. Images reveal mild loss of femoral acetabular joint space with mild osteophyte formation noted. CAM deformity of the right hip noted. No fractures or dislocations. Anteroposterior, oblique, and lateral views of the lumbar spine were obtained. Images reveal no scoliosis. Mild loss of lumbar lordosis noted. Significant loss of disc space noted at L4-L5 and L5-S1. Intervertebral osteophyte formation of L4 and L5. No fractures or wedging noted. Calcification of the abdominal aorta noted. Degeneration of the lumbar facets noted. Patient was recently diagnosed with lumbar radiculoapthy due to symptoms being more consistent vs hip pathology given the absence of hip symptoms with provocative maneuvers. Prednisone tapers have been considered but is on hold for now due to patient's diabetes.    Currently in Pain? No/denies  Treatment - retesting for progress note  FOTO: 66   6MWT: 865 ft  Step length exercise performed over 10 meter track - 3# AW on BLEs Trial 1- 19 steps With dual task for the following: Trial 2 - 22 steps  Trial 3 - 21 steps Trial 4- 22 steps Trial 5- 23 steps Trial 6- 19 steps Trial 7- 19 steps  Retro-walking with 3# AW on BLES 4x over 10 meter track. Pt takes small steps. Difficulty correcting even with cuing.  PT provides education primarily in the form of VC/Demo to facilitate movement  at target joints and correct muscle activation with all exercises and testing performed. Pt shows good carryover after cuing   PT Education - 03/01/21 1551     Education Details reassessment findings, indications for PT/POC    Person(s) Educated Patient    Methods Explanation    Comprehension Verbalized understanding              PT Short Term Goals - 03/01/21 1553       PT SHORT TERM GOAL #1   Title Patient will be independent in home exercise program to improve strength/mobility for better functional independence with ADLs.    Baseline patient is compliant with all his HEP; attending water aerobics on days he's not coming to therapy.    Time 4    Period Weeks    Status Achieved    Target Date 08/03/20      PT SHORT TERM GOAL #2   Title Patient will increase RLE gross strength to 4+/5 as to improve functional strength for independent gait, increased standing tolerance and increased ADL ability.    Baseline 12/21: RLE gross strength 4/5 2/23: see note; 4/11: grossly 5/5 BLEs; 7/18: grossly 5/5 BLEs    Time 4    Period Weeks    Status Achieved    Target Date 08/03/20               PT Long Term Goals - 02/28/21 1408       PT LONG TERM GOAL #1   Title Patient will increase FOTO score to equal to or greater than 67 to demonstrate statistically significant improvement in mobility and quality of life.    Baseline 12/21: 57 2/23: 74%, 5/23: 55%, 6/15: 57%; 7/18: 70% 8/15: 66% (previously achieved)    Time 8    Period Weeks    Status Achieved    Target Date 03/28/21      PT LONG TERM GOAL #2   Title Patient (> 27 years old) will complete five times sit to stand test in <12 seconds indicating an increased LE strength and improved balance.    Baseline 12/21: 17.65s, 01/26: 14.47s; 3/16: 13.17sec; 4/11: 8.5 sec, 5/23: 15.25 sec, 6/15: 8.25 sec with arms across chest;    Time 8    Period Weeks    Status Achieved      PT LONG TERM GOAL #3   Title Patient will increase  six minute walk test distance to >1000 for progression to community ambulator and improve gait ability    Baseline 12/21: 500, 01/26: 655 ft 2/23: 735 ft; 3/16: 644f; 4/11: 737 ft, 6/15: 830 feet; 7/18: 810 ft 8/15: 865 ft    Time 8    Period Weeks    Status On-going    Target Date 03/28/21      PT LONG TERM GOAL #4   Title Patient will reduce timed up and go to <11 seconds to  reduce fall risk and demonstrate improved transfer/gait ability.    Baseline 12/21: 16.78s, 01/26: 12.08s 2/23: 10 sec, 5/23: 10 sec, 6/15: 8.8 sec    Time 8    Period Weeks    Status Achieved      PT LONG TERM GOAL #5   Title Patient will increase lower extremity functional scale to >60/80 to demonstrate improved functional mobility and increased tolerance with ADLs.    Baseline 12/21: 50/80, 01/26: 62/80, 6/15:61/80    Time 8    Period Weeks    Status Achieved                   Plan - 03/01/21 1559     Clinical Impression Statement Testing performed for progress note on this date. Pt improved 6MWT performance, ambulating 865 ft in 6 minutes, indicating improved gait ability/functional capacity. While pt shows improvement, pt still struggles with gait mechanics/improving step-length. This was evident with step-length exercise, where pt was unable to improve his performance with the addition of a cognitive dual-task. Pt should continue to focus on this skill in future sessions. Patient's condition has the potential to improve in response to therapy. Maximum improvement is yet to be obtained. The anticipated improvement is attainable and reasonable in a generally predictable time.The pt will benefit from further skilled PT to improve BLE strength, gait mechanics, and endurance in order to increase ease and safety with all functional mobility.    Personal Factors and Comorbidities Comorbidity 3+;Time since onset of injury/illness/exacerbation    Comorbidities diabetes, hypertension, hyperlipidemia     Examination-Activity Limitations Squat;Stairs;Transfers    Examination-Participation Restrictions Church    Stability/Clinical Decision Making Evolving/Moderate complexity    Rehab Potential Fair    PT Frequency 2x / week    PT Duration 8 weeks    PT Treatment/Interventions Moist Heat;Electrical Stimulation;Gait training;Stair training;Functional mobility training;Therapeutic activities;Therapeutic exercise;Balance training;Neuromuscular re-education;Patient/family education;Manual techniques;Passive range of motion;Energy conservation    PT Next Visit Plan Advance multiplanar, multi surface balance training; Right hip abduction, ER strength; assess mm length LEs, continue LE stretching and address back mobility, continue POC as previously indicated    PT Home Exercise Plan no updates    Consulted and Agree with Plan of Care Patient             Patient will benefit from skilled therapeutic intervention in order to improve the following deficits and impairments:  Abnormal gait, Decreased endurance, Impaired sensation, Decreased activity tolerance, Decreased strength, Decreased balance, Decreased mobility, Difficulty walking, Decreased range of motion, Decreased safety awareness, Improper body mechanics  Visit Diagnosis: Other abnormalities of gait and mobility  Other lack of coordination  Muscle weakness (generalized)     Problem List There are no problems to display for this patient.  Ricard Dillon PT, DPT  03/01/2021, 4:04 PM  [place] MAIN [facility] SERVICES [address] [place], [place], [address] Phone: [phone]   Fax:  [phone]  Name: Michael Cohen MRN: [id number] Date of Birth: 09/09/1940

## 2021-03-02 ENCOUNTER — Ambulatory Visit: Payer: Medicare Other

## 2021-03-02 ENCOUNTER — Other Ambulatory Visit: Payer: Self-pay

## 2021-03-02 DIAGNOSIS — R2689 Other abnormalities of gait and mobility: Secondary | ICD-10-CM

## 2021-03-02 DIAGNOSIS — R278 Other lack of coordination: Secondary | ICD-10-CM

## 2021-03-02 DIAGNOSIS — M6281 Muscle weakness (generalized): Secondary | ICD-10-CM

## 2021-03-02 NOTE — Therapy (Signed)
[place] MAIN [facility] SERVICES [address] [place], [place], [address] Phone: [phone]   Fax:  [phone]  Physical Therapy Treatment  Patient Details  Name: Michael Cohen MRN: [id number] Date of Birth: Nov 03, 1940 Referring Provider (PT): [name]   Encounter Date: 03/02/2021   PT End of Session - 03/02/21 1452     Visit Number 51    Number of Visits 32    Date for PT Re-Evaluation 03/28/21    Authorization Type Medicare Parts A & B    Authorization Time Period 01/31/21-03/28/21    PT Start Time [id number]    PT Stop Time 1430    PT Time Calculation (min) 41 min    Equipment Utilized During Treatment Gait belt    Activity Tolerance Patient tolerated treatment well;No increased pain    Behavior During Therapy WFL for tasks assessed/performed             Past Medical History:  Diagnosis Date   Diabetes mellitus without complication ([place])    Hypercholesteremia    Hypertension     Past Surgical History:  Procedure Laterality Date   CATARACT EXTRACTION Bilateral    COLONOSCOPY WITH PROPOFOL N/A 12/12/2017   Procedure: COLONOSCOPY WITH PROPOFOL;  Surgeon: [name], [name], MD;  Location: [facility] ENDOSCOPY;  Service: Gastroenterology;  Laterality: N/A;   EYE SURGERY      There were no vitals filed for this visit.   Subjective Assessment - 03/02/21 1353     Subjective Pt doing well today, no falls, no medication updates, no MD visits. Pt restarted water aerobics this week. Pt reports he is back to walking the dog 3-4x per day, distances similar to prior hip popping incident.    Pertinent History 80 y.o. 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 His pain began around a month ago and is located over the proximal lateral thigh and in the right buttocks. He describes his pain as achy more than sharp. He is not sure of any aggravating activities. He reports of having some  pain at rest today. He reports associated pain or "tightness" felt along anterior and lateral portions of the lower leg. He was previously evaluated by [name] in podiatry and diagnosed with tibialis tendinitis. He denies associated groin pain, denies radicular symptoms. He has tried Baclofen without any relief of his symptoms. Of note, the patient states that he was scheduled to go on a cruise this week but was canceled due to the pain he is experiencing. He states that he would be unable to walk around and participate fully in his vacation given his pain. An anteroposterior view of the pelvis and anteroposterior and lateral views of the right hip were obtained. Images reveal mild loss of femoral acetabular joint space with mild osteophyte formation noted. CAM deformity of the right hip noted. No fractures or dislocations. Anteroposterior, oblique, and lateral views of the lumbar spine were obtained. Images reveal no scoliosis. Mild loss of lumbar lordosis noted. Significant loss of disc space noted at L4-L5 and L5-S1. Intervertebral osteophyte formation of L4 and L5. No fractures or wedging noted. Calcification of the abdominal aorta noted. Degeneration of the lumbar facets noted. Patient was recently diagnosed with lumbar radiculoapthy due to symptoms being more consistent vs hip pathology given the absence of hip symptoms with provocative maneuvers. Prednisone tapers have been considered but is on hold for now due to patient's diabetes.  Currently in Pain? No/denies                [facility] PT Assessment - 03/02/21 0001       Transfers   Five time sit to stand comments  12.45, 12.71, 12.72   appears significantly weak in BLE, but able to perform with hands on thighs     Ambulation/Gait   Ambulation Distance (Feet) 640 Feet   interval training in hallway   Gait Pattern Step-through pattern;Decreased step length - left;Right hip hike;Lateral trunk lean to left;Decreased trunk rotation;Abducted -  left      Standardized Balance Assessment   Five times sit to stand comments  12.45sec, hands on knees   8.25sec crossing chest (12/29/20)             -5xSTS from chair, cued for hands-free, but unable to do so, rest interval between efforts -Overground AMB gait training, cues for faster AMB, bigger steps, decreased LLE abducted gait -4x81f c 5lb AW bilat (dragging feet toward end and slowing down considerably  -seated LAQ 2x12 bilat c 5lb AW, difficulty obtaining TKA, cues to attend to this.    *gait continues to appear antalgic, but pt has no pain still. I believe it reflects the pt's attempt to improve LLE clearance in swing phase and low power production on Right hip/pelvis for hike/lean.          PT Short Term Goals - 03/01/21 1553       PT SHORT TERM GOAL #1   Title Patient will be independent in home exercise program to improve strength/mobility for better functional independence with ADLs.    Baseline patient is compliant with all his HEP; attending water aerobics on days he's not coming to therapy.    Time 4    Period Weeks    Status Achieved    Target Date 08/03/20      PT SHORT TERM GOAL #2   Title Patient will increase RLE gross strength to 4+/5 as to improve functional strength for independent gait, increased standing tolerance and increased ADL ability.    Baseline 12/21: RLE gross strength 4/5 2/23: see note; 4/11: grossly 5/5 BLEs; 7/18: grossly 5/5 BLEs    Time 4    Period Weeks    Status Achieved    Target Date 08/03/20               PT Long Term Goals - 02/28/21 1408       PT LONG TERM GOAL #1   Title Patient will increase FOTO score to equal to or greater than 67 to demonstrate statistically significant improvement in mobility and quality of life.    Baseline 12/21: 57 2/23: 74%, 5/23: 55%, 6/15: 57%; 7/18: 70% 8/15: 66% (previously achieved)    Time 8    Period Weeks    Status Achieved    Target Date 03/28/21      PT LONG TERM GOAL  #2   Title Patient (> 674years old) will complete five times sit to stand test in <12 seconds indicating an increased LE strength and improved balance.    Baseline 12/21: 17.65s, 01/26: 14.47s; 3/16: 13.17sec; 4/11: 8.5 sec, 5/23: 15.25 sec, 6/15: 8.25 sec with arms across chest;    Time 8    Period Weeks    Status Achieved      PT LONG TERM GOAL #3   Title Patient will increase six minute walk test distance to >1000 for progression to community ambulator and  improve gait ability    Baseline 12/21: 500, 01/26: 655 ft 2/23: 735 ft; 3/16: 652f; 4/11: 737 ft, 6/15: 830 feet; 7/18: 810 ft 8/15: 865 ft    Time 8    Period Weeks    Status On-going    Target Date 03/28/21      PT LONG TERM GOAL #4   Title Patient will reduce timed up and go to <11 seconds to reduce fall risk and demonstrate improved transfer/gait ability.    Baseline 12/21: 16.78s, 01/26: 12.08s 2/23: 10 sec, 5/23: 10 sec, 6/15: 8.8 sec    Time 8    Period Weeks    Status Achieved      PT LONG TERM GOAL #5   Title Patient will increase lower extremity functional scale to >60/80 to demonstrate improved functional mobility and increased tolerance with ADLs.    Baseline 12/21: 50/80, 01/26: 62/80, 6/15:61/80    Time 8    Period Weeks    Status Achieved                   Plan - 03/02/21 1454     Clinical Impression Statement Continued with POC, focusing on gait training to address recent loss of gait symetry in time and motor amplitude. Pt continues to exhibit gait deviation without pain, impairs ability to perform typical automated motor changes that allow for easy transition to faster gait speeds. Pt reports to feel more closely back to his prior activity tolerance, however his 6MWT remains far below age matched norm values. Pt had difficulty attending to cues for focal gait modification, however he gains more symetry when AMB at near maximal speeds. WIll conitnue to benefit from skilled PT services to advance  progress toward goals of treatment.    Personal Factors and Comorbidities Comorbidity 3+;Time since onset of injury/illness/exacerbation    Comorbidities diabetes, hypertension, hyperlipidemia    Examination-Activity Limitations Squat;Stairs;Transfers    Examination-Participation Restrictions [place]    Stability/Clinical Decision Making Evolving/Moderate complexity    Clinical Decision Making Moderate    Rehab Potential Poor    PT Frequency 2x / week    PT Duration 8 weeks    PT Treatment/Interventions Moist Heat;Electrical Stimulation;Gait training;Stair training;Functional mobility training;Therapeutic activities;Therapeutic exercise;Balance training;Neuromuscular re-education;Patient/family education;Manual techniques;Passive range of motion;Energy conservation    PT Next Visit Plan Advance multiplanar, multi surface balance training; Right hip abduction, ER strength; assess mm length LEs, continue LE stretching and address back mobility, continue POC as previously indicated    PT Home Exercise Plan no updates    Consulted and Agree with Plan of Care Patient             Patient will benefit from skilled therapeutic intervention in order to improve the following deficits and impairments:  Abnormal gait, Decreased endurance, Impaired sensation, Decreased activity tolerance, Decreased strength, Decreased balance, Decreased mobility, Difficulty walking, Decreased range of motion, Decreased safety awareness, Improper body mechanics  Visit Diagnosis: Other abnormalities of gait and mobility  Other lack of coordination  Muscle weakness (generalized)     Problem List There are no problems to display for this patient.  3:10 PM, 03/02/21 AEtta Grandchild PT, DPT Physical Therapist - [facility]- [phone]    BEtta Grandchild8/17/2022, 3:06 PM  [place] [facility] SERVICES [address] [address]Phone: [phone]  Fax:  [phone] Name: WKayson NewbyMRN:  [id number] Date of Birth: June 18, 1941

## 2021-03-07 ENCOUNTER — Ambulatory Visit: Payer: Medicare Other

## 2021-03-07 ENCOUNTER — Other Ambulatory Visit: Payer: Self-pay

## 2021-03-07 DIAGNOSIS — R278 Other lack of coordination: Secondary | ICD-10-CM

## 2021-03-07 DIAGNOSIS — R2689 Other abnormalities of gait and mobility: Secondary | ICD-10-CM

## 2021-03-07 DIAGNOSIS — R269 Unspecified abnormalities of gait and mobility: Secondary | ICD-10-CM

## 2021-03-07 DIAGNOSIS — M6281 Muscle weakness (generalized): Secondary | ICD-10-CM

## 2021-03-07 DIAGNOSIS — M25551 Pain in right hip: Secondary | ICD-10-CM

## 2021-03-07 DIAGNOSIS — R2681 Unsteadiness on feet: Secondary | ICD-10-CM

## 2021-03-07 NOTE — Therapy (Signed)
[place] MAIN [facility] SERVICES [address] [place], [place], [address] Phone: [phone]   Fax:  [phone]  Physical Therapy Treatment  Patient Details  Name: Michael Cohen MRN: [id number] Date of Birth: 05-28-1941 Referring Provider (PT): [name]   Encounter Date: 03/07/2021   PT End of Session - 03/07/21 1357     Visit Number 61    Number of Visits 47    Date for PT Re-Evaluation 03/28/21    Authorization Type Medicare Parts A & B    Authorization Time Period 01/31/21-03/28/21    PT Start Time [id number]    PT Stop Time 1427    PT Time Calculation (min) 38 min    Equipment Utilized During Treatment Gait belt    Activity Tolerance Patient tolerated treatment well;No increased pain    Behavior During Therapy WFL for tasks assessed/performed             Past Medical History:  Diagnosis Date   Diabetes mellitus without complication ([place])    Hypercholesteremia    Hypertension     Past Surgical History:  Procedure Laterality Date   CATARACT EXTRACTION Bilateral    COLONOSCOPY WITH PROPOFOL N/A 12/12/2017   Procedure: COLONOSCOPY WITH PROPOFOL;  Surgeon: [name], [name], MD;  Location: [facility] ENDOSCOPY;  Service: Gastroenterology;  Laterality: N/A;   EYE SURGERY      There were no vitals filed for this visit.   Subjective Assessment - 03/07/21 1354     Subjective Pt doing well. Saw his PCP today and had a good report, no meds changes. No pain today. Pt still walking the dog multiple times daily without issue.    Pertinent History 80 y.o. 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 His pain began around a month ago and is located over the proximal lateral thigh and in the right buttocks. He describes his pain as achy more than sharp. He is not sure of any aggravating activities. He reports of having some pain at rest today. He reports associated pain or "tightness"  felt along anterior and lateral portions of the lower leg. He was previously evaluated by [name] in podiatry and diagnosed with tibialis tendinitis. He denies associated groin pain, denies radicular symptoms. He has tried Baclofen without any relief of his symptoms. Of note, the patient states that he was scheduled to go on a cruise this week but was canceled due to the pain he is experiencing. He states that he would be unable to walk around and participate fully in his vacation given his pain. An anteroposterior view of the pelvis and anteroposterior and lateral views of the right hip were obtained. Images reveal mild loss of femoral acetabular joint space with mild osteophyte formation noted. CAM deformity of the right hip noted. No fractures or dislocations. Anteroposterior, oblique, and lateral views of the lumbar spine were obtained. Images reveal no scoliosis. Mild loss of lumbar lordosis noted. Significant loss of disc space noted at L4-L5 and L5-S1. Intervertebral osteophyte formation of L4 and L5. No fractures or wedging noted. Calcification of the abdominal aorta noted. Degeneration of the lumbar facets noted. Patient was recently diagnosed with lumbar radiculoapthy due to symptoms being more consistent vs hip pathology given the absence of hip symptoms with provocative maneuvers. Prednisone tapers have been considered but is on hold for now due to patient's diabetes.    Currently in Pain? No/denies  INTERVENTION THIS DATE:  -STS from elevated plinth x20  -Lateral stepping 20x red TB, Retro and FWD AMB x66f  -16 item transfer table to elevated plinth across red mat, 2x -Lateral stepping c GreenTB 2x141fbilat; FWD and retro Stepping 2x1566filat     PT Education - 03/07/21 1356     Education Details Continue with walking program    Person(s) Educated Patient    Methods Explanation    Comprehension Verbalized understanding              PT Short Term Goals -  03/01/21 1553       PT SHORT TERM GOAL #1   Title Patient will be independent in home exercise program to improve strength/mobility for better functional independence with ADLs.    Baseline patient is compliant with all his HEP; attending water aerobics on days he's not coming to therapy.    Time 4    Period Weeks    Status Achieved    Target Date 08/03/20      PT SHORT TERM GOAL #2   Title Patient will increase RLE gross strength to 4+/5 as to improve functional strength for independent gait, increased standing tolerance and increased ADL ability.    Baseline 12/21: RLE gross strength 4/5 2/23: see note; 4/11: grossly 5/5 BLEs; 7/18: grossly 5/5 BLEs    Time 4    Period Weeks    Status Achieved    Target Date 08/03/20               PT Long Term Goals - 02/28/21 1408       PT LONG TERM GOAL #1   Title Patient will increase FOTO score to equal to or greater than 67 to demonstrate statistically significant improvement in mobility and quality of life.    Baseline 12/21: 57 2/23: 74%, 5/23: 55%, 6/15: 57%; 7/18: 70% 8/15: 66% (previously achieved)    Time 8    Period Weeks    Status Achieved    Target Date 03/28/21      PT LONG TERM GOAL #2   Title Patient (> 60 45ars old) will complete five times sit to stand test in <12 seconds indicating an increased LE strength and improved balance.    Baseline 12/21: 17.65s, 01/26: 14.47s; 3/16: 13.17sec; 4/11: 8.5 sec, 5/23: 15.25 sec, 6/15: 8.25 sec with arms across chest;    Time 8    Period Weeks    Status Achieved      PT LONG TERM GOAL #3   Title Patient will increase six minute walk test distance to >1000 for progression to community ambulator and improve gait ability    Baseline 12/21: 500, 01/26: 655 ft 2/23: 735 ft; 3/16: [id number]/11: 737 ft, 6/15: 830 feet; 7/18: 810 ft 8/15: 865 ft    Time 8    Period Weeks    Status On-going    Target Date 03/28/21      PT LONG TERM GOAL #4   Title Patient will reduce timed up and go  to <11 seconds to reduce fall risk and demonstrate improved transfer/gait ability.    Baseline 12/21: 16.78s, 01/26: 12.08s 2/23: 10 sec, 5/23: 10 sec, 6/15: 8.8 sec    Time 8    Period Weeks    Status Achieved      PT LONG TERM GOAL #5   Title Patient will increase lower extremity functional scale to >60/80 to demonstrate improved functional mobility and increased tolerance with ADLs.  Baseline 12/21: 50/80, 01/26: 62/80, 6/15:61/80    Time 8    Period Weeks    Status Achieved                   Plan - 03/07/21 1358     Clinical Impression Statement Continued with current plan of care as laid out in evaluation and recent prior sessions. Pt remains motivated to advance progress toward goals. Rest breaks provided as needed, pt quick to ask when needed. Pt does require varying levels of assistance and cuing for completion of exercises for correct form and sometimes due to pain/weakness. Pt closely monitored throughout session for safe vitals response and to maximize patient safety during interventions. Pt continues to demonstrate progress toward goals AEB progression of some interventions this date either in volume or intensity. Pt has no LOB in session, albeit he makes good judgment and avoids risk when able. Pt endorses no activity restrictions at this point in time. Despite sustained gait changes over several weeks now, he feels he is able to AMB as much and as good as he would like.    Personal Factors and Comorbidities Comorbidity 3+;Time since onset of injury/illness/exacerbation    Comorbidities diabetes, hypertension, hyperlipidemia    Examination-Activity Limitations Squat;Stairs;Transfers    Examination-Participation Restrictions [place]    Stability/Clinical Decision Making Evolving/Moderate complexity    Clinical Decision Making Moderate    Rehab Potential Poor    PT Frequency 2x / week    PT Duration 8 weeks    PT Treatment/Interventions Moist Heat;Electrical  Stimulation;Gait training;Stair training;Functional mobility training;Therapeutic activities;Therapeutic exercise;Balance training;Neuromuscular re-education;Patient/family education;Manual techniques;Passive range of motion;Energy conservation    PT Next Visit Plan Advance multiplanar, multi surface balance training; Right hip abduction, ER strength; assess mm length LEs, continue LE stretching and address back mobility, continue POC as previously indicated    PT Home Exercise Plan no updates    Consulted and Agree with Plan of Care Patient             Patient will benefit from skilled therapeutic intervention in order to improve the following deficits and impairments:  Abnormal gait, Decreased endurance, Impaired sensation, Decreased activity tolerance, Decreased strength, Decreased balance, Decreased mobility, Difficulty walking, Decreased range of motion, Decreased safety awareness, Improper body mechanics  Visit Diagnosis: Other abnormalities of gait and mobility  Other lack of coordination  Muscle weakness (generalized)  Pain in right hip  Unsteadiness on feet  Gait abnormality     Problem List There are no problems to display for this patient.  2:30 PM, 03/07/21 Etta Grandchild, PT, DPT Physical Therapist - [facility]  Outpatient Physical Therapy- [place] [phone]     Etta Grandchild 03/07/2021, 2:05 PM  [place] MAIN [facility] SERVICES [address] [place], [place], [address] Phone: [phone]   Fax:  [phone]  Name: Michael Cohen MRN: [id number] Date of Birth: 1940-09-13

## 2021-03-09 ENCOUNTER — Ambulatory Visit: Payer: Medicare Other

## 2021-03-09 ENCOUNTER — Other Ambulatory Visit: Payer: Self-pay

## 2021-03-09 DIAGNOSIS — R2689 Other abnormalities of gait and mobility: Secondary | ICD-10-CM | POA: Diagnosis not present

## 2021-03-09 DIAGNOSIS — M6281 Muscle weakness (generalized): Secondary | ICD-10-CM

## 2021-03-09 DIAGNOSIS — R278 Other lack of coordination: Secondary | ICD-10-CM

## 2021-03-09 NOTE — Therapy (Signed)
[place] MAIN [facility] SERVICES [address] [place], [place], [address] Phone: [phone]   Fax:  [phone]  Physical Therapy Treatment  Patient Details  Name: Michael Cohen MRN: [id number] Date of Birth: Jul 27, 1940 Referring Provider (PT): [name]   Encounter Date: 03/09/2021   PT End of Session - 03/09/21 1700     Visit Number [id number]    Number of Visits 55    Date for PT Re-Evaluation 03/28/21    Authorization Type Medicare Parts A & B    Authorization Time Period 01/31/21-03/28/21    PT Start Time [id number]    PT Stop Time 1430    PT Time Calculation (min) 43 min    Equipment Utilized During Treatment Gait belt    Activity Tolerance Patient tolerated treatment well;No increased pain    Behavior During Therapy WFL for tasks assessed/performed             Past Medical History:  Diagnosis Date   Diabetes mellitus without complication ([place])    Hypercholesteremia    Hypertension     Past Surgical History:  Procedure Laterality Date   CATARACT EXTRACTION Bilateral    COLONOSCOPY WITH PROPOFOL N/A 12/12/2017   Procedure: COLONOSCOPY WITH PROPOFOL;  Surgeon: [name], [name], MD;  Location: [facility] ENDOSCOPY;  Service: Gastroenterology;  Laterality: N/A;   EYE SURGERY      There were no vitals filed for this visit.   Subjective Assessment - 03/09/21 1347     Subjective Patient reports no pain today.  Patient reports no falls or near falls since last visit.    Pertinent History 80 y.o. 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 His pain began around a month ago and is located over the proximal lateral thigh and in the right buttocks. He describes his pain as achy more than sharp. He is not sure of any aggravating activities. He reports of having some pain at rest today. He reports associated pain or "tightness" felt along anterior and lateral portions of the lower leg. He  was previously evaluated by [name] in podiatry and diagnosed with tibialis tendinitis. He denies associated groin pain, denies radicular symptoms. He has tried Baclofen without any relief of his symptoms. Of note, the patient states that he was scheduled to go on a cruise this week but was canceled due to the pain he is experiencing. He states that he would be unable to walk around and participate fully in his vacation given his pain. An anteroposterior view of the pelvis and anteroposterior and lateral views of the right hip were obtained. Images reveal mild loss of femoral acetabular joint space with mild osteophyte formation noted. CAM deformity of the right hip noted. No fractures or dislocations. Anteroposterior, oblique, and lateral views of the lumbar spine were obtained. Images reveal no scoliosis. Mild loss of lumbar lordosis noted. Significant loss of disc space noted at L4-L5 and L5-S1. Intervertebral osteophyte formation of L4 and L5. No fractures or wedging noted. Calcification of the abdominal aorta noted. Degeneration of the lumbar facets noted. Patient was recently diagnosed with lumbar radiculoapthy due to symptoms being more consistent vs hip pathology given the absence of hip symptoms with provocative maneuvers. Prednisone tapers have been considered but is on hold for now due to patient's diabetes.    Currently in Pain? No/denies               INTERVENTION THIS  DATE:   -STS 15x, 2x10 from standard height chair The patient rates the exercise medium to high  Seated hamstring stretch 2x30 sec  Seated piriformis stretch 2x30 sec  At matrix cable machine- Pt performs resisted side stepping @ 12.5# - x multiple reps for each LE. Pt reports fatigue. Cuing for larger steps.   Ambulation around clinic gym (approx 150 ft) with cuing for increased LLE step-length. However, pt at times does demonstrate decreased step-length B. Able to intermittently correct. Pt does not feel he is  at his baseline.  T  Agility ladder forward/backward stepping to encourage increased/symmetrical step length. Addition of two 1/2 foams to step over. - x multiple reps.   PT provides cuing throughout session in the form of VC/TC/demo to facilitate movement at target joints and correct muscle activation with exercises. Pt shows improvement following cues within session.   PT Education - 03/09/21 1700     Education Details exercise technique, body mechanics    Person(s) Educated Patient    Methods Explanation;Demonstration;Verbal cues    Comprehension Verbalized understanding;Returned demonstration              PT Short Term Goals - 03/01/21 1553       PT SHORT TERM GOAL #1   Title Patient will be independent in home exercise program to improve strength/mobility for better functional independence with ADLs.    Baseline patient is compliant with all his HEP; attending water aerobics on days he's not coming to therapy.    Time 4    Period Weeks    Status Achieved    Target Date 08/03/20      PT SHORT TERM GOAL #2   Title Patient will increase RLE gross strength to 4+/5 as to improve functional strength for independent gait, increased standing tolerance and increased ADL ability.    Baseline 12/21: RLE gross strength 4/5 2/23: see note; 4/11: grossly 5/5 BLEs; 7/18: grossly 5/5 BLEs    Time 4    Period Weeks    Status Achieved    Target Date 08/03/20               PT Long Term Goals - 02/28/21 1408       PT LONG TERM GOAL #1   Title Patient will increase FOTO score to equal to or greater than 67 to demonstrate statistically significant improvement in mobility and quality of life.    Baseline 12/21: 57 2/23: 74%, 5/23: 55%, 6/15: 57%; 7/18: 70% 8/15: 66% (previously achieved)    Time 8    Period Weeks    Status Achieved    Target Date 03/28/21      PT LONG TERM GOAL #2   Title Patient (> 42 years old) will complete five times sit to stand test in <12 seconds  indicating an increased LE strength and improved balance.    Baseline 12/21: 17.65s, 01/26: 14.47s; 3/16: 13.17sec; 4/11: 8.5 sec, 5/23: 15.25 sec, 6/15: 8.25 sec with arms across chest;    Time 8    Period Weeks    Status Achieved      PT LONG TERM GOAL #3   Title Patient will increase six minute walk test distance to >1000 for progression to community ambulator and improve gait ability    Baseline 12/21: 500, 01/26: 655 ft 2/23: 735 ft; 3/16: 638f; 4/11: 737 ft, 6/15: 830 feet; 7/18: 810 ft 8/15: 865 ft    Time 8    Period Weeks    Status  On-going    Target Date 03/28/21      PT LONG TERM GOAL #4   Title Patient will reduce timed up and go to <11 seconds to reduce fall risk and demonstrate improved transfer/gait ability.    Baseline 12/21: 16.78s, 01/26: 12.08s 2/23: 10 sec, 5/23: 10 sec, 6/15: 8.8 sec    Time 8    Period Weeks    Status Achieved      PT LONG TERM GOAL #5   Title Patient will increase lower extremity functional scale to >60/80 to demonstrate improved functional mobility and increased tolerance with ADLs.    Baseline 12/21: 50/80, 01/26: 62/80, 6/15:61/80    Time 8    Period Weeks    Status Achieved                   Plan - 03/09/21 1704     Clinical Impression Statement Pt performs more challenging LE training/gait mechanics training exercises this session in the cable machine and with the agility ladder. He is able to intermittently correct his step-length without cues, but still struggles with LLE in particular. He does not feel his walking is quite where it could be. The pt will benefit from further skilled PT to continue to improve BLE strength, gait and overall functional mobility in order to increase ease with ADLs.    Personal Factors and Comorbidities Comorbidity 3+;Time since onset of injury/illness/exacerbation    Comorbidities diabetes, hypertension, hyperlipidemia    Examination-Activity Limitations Squat;Stairs;Transfers     Examination-Participation Restrictions Church    Stability/Clinical Decision Making Evolving/Moderate complexity    Rehab Potential Poor    PT Frequency 2x / week    PT Duration 8 weeks    PT Treatment/Interventions Moist Heat;Electrical Stimulation;Gait training;Stair training;Functional mobility training;Therapeutic activities;Therapeutic exercise;Balance training;Neuromuscular re-education;Patient/family education;Manual techniques;Passive range of motion;Energy conservation    PT Next Visit Plan Advance multiplanar, multi surface balance training; Right hip abduction, ER strength; assess mm length LEs, continue LE stretching and address back mobility, continue POC as previously indicated    PT Home Exercise Plan no updates    Consulted and Agree with Plan of Care Patient             Patient will benefit from skilled therapeutic intervention in order to improve the following deficits and impairments:  Abnormal gait, Decreased endurance, Impaired sensation, Decreased activity tolerance, Decreased strength, Decreased balance, Decreased mobility, Difficulty walking, Decreased range of motion, Decreased safety awareness, Improper body mechanics  Visit Diagnosis: Other abnormalities of gait and mobility  Muscle weakness (generalized)  Other lack of coordination     Problem List There are no problems to display for this patient.  Ricard Dillon PT, DPT 03/09/2021, 5:08 PM  [place] MAIN [facility] SERVICES [address] [place], [place], [address] Phone: [phone]   Fax:  [phone]  Name: Michael Cohen MRN: [id number] Date of Birth: Apr 17, 1941

## 2021-03-14 ENCOUNTER — Other Ambulatory Visit: Payer: Self-pay

## 2021-03-14 ENCOUNTER — Ambulatory Visit: Payer: Medicare Other

## 2021-03-14 DIAGNOSIS — R2689 Other abnormalities of gait and mobility: Secondary | ICD-10-CM

## 2021-03-14 DIAGNOSIS — M6281 Muscle weakness (generalized): Secondary | ICD-10-CM

## 2021-03-14 DIAGNOSIS — R2681 Unsteadiness on feet: Secondary | ICD-10-CM

## 2021-03-14 NOTE — Therapy (Signed)
[place] MAIN [facility] SERVICES [address] [place], [place], [address] Phone: [phone]   Fax:  [phone]  Physical Therapy Treatment  Patient Details  Name: Michael Cohen MRN: [id number] Date of Birth: 1941/06/30 Referring Provider (PT): [name]   Encounter Date: 03/14/2021   PT End of Session - 03/14/21 1634     Visit Number [id number]    Number of Visits 84    Date for PT Re-Evaluation 03/28/21    Authorization Type Medicare Parts A & B    Authorization Time Period 01/31/21-03/28/21    PT Start Time [id number]    PT Stop Time 1429    PT Time Calculation (min) 42 min    Equipment Utilized During Treatment Gait belt    Activity Tolerance Patient tolerated treatment well;No increased pain    Behavior During Therapy WFL for tasks assessed/performed             Past Medical History:  Diagnosis Date   Diabetes mellitus without complication ([place])    Hypercholesteremia    Hypertension     Past Surgical History:  Procedure Laterality Date   CATARACT EXTRACTION Bilateral    COLONOSCOPY WITH PROPOFOL N/A 12/12/2017   Procedure: COLONOSCOPY WITH PROPOFOL;  Surgeon: [name], [name], MD;  Location: [facility] ENDOSCOPY;  Service: Gastroenterology;  Laterality: N/A;   EYE SURGERY      There were no vitals filed for this visit.   Subjective Assessment - 03/14/21 1349     Subjective Pt was walking his dog, Daisy, in the dark and reports trip in the flower bed. He presents with L band-aid d/t cut.    Pertinent History 80 y.o. 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 His pain began around a month ago and is located over the proximal lateral thigh and in the right buttocks. He describes his pain as achy more than sharp. He is not sure of any aggravating activities. He reports of having some pain at rest today. He reports associated pain or "tightness" felt along anterior and lateral  portions of the lower leg. He was previously evaluated by [name] in podiatry and diagnosed with tibialis tendinitis. He denies associated groin pain, denies radicular symptoms. He has tried Baclofen without any relief of his symptoms. Of note, the patient states that he was scheduled to go on a cruise this week but was canceled due to the pain he is experiencing. He states that he would be unable to walk around and participate fully in his vacation given his pain. An anteroposterior view of the pelvis and anteroposterior and lateral views of the right hip were obtained. Images reveal mild loss of femoral acetabular joint space with mild osteophyte formation noted. CAM deformity of the right hip noted. No fractures or dislocations. Anteroposterior, oblique, and lateral views of the lumbar spine were obtained. Images reveal no scoliosis. Mild loss of lumbar lordosis noted. Significant loss of disc space noted at L4-L5 and L5-S1. Intervertebral osteophyte formation of L4 and L5. No fractures or wedging noted. Calcification of the abdominal aorta noted. Degeneration of the lumbar facets noted. Patient was recently diagnosed with lumbar radiculoapthy due to symptoms being more consistent vs hip pathology given the absence of hip symptoms with provocative maneuvers. Prednisone tapers have been considered but is on hold for now due to patient's diabetes.    Currently in Pain? No/denies  TREATMENT:  At support surface, CGA provided throughout-  EC standing on firm surface - 30 sec- no decrease in stability EO on airex - 30 sec, mild increase in sway EC on airex - 2x30 sec, intermittent UE support  Stepping forward/backward over orange hurdle - 1x12, 1x5, increased reliance on UUE support. Kicks over hurdle/difficulty clearing step 2-3x.  Red mat obstacle course with ankle weights placed under mat to increase uneven surface - forward ambulation, side-stepping; pt rates medium. Exhibits  frequent step-strategy, use of mid-guard.   Floor agility ladder to promote recip stepping/gait patter - x multiple reps with and without 5# AW on BLEs - pt able to infrequently use recip. pattern. Does show some improvement with reps. Reports feels unsteady.  Over 10 meter track with 5# AW on BLEs. Purpose of exercise to promote equal and increased step-length, LE strength: Trial 1 - 23 steps Trial 2 - 23 steps Pt difficulty increasing step-length even with cuing.   PT provides cuing throughout session in the form of VC/TC/demo to facilitate movement at target joints and correct muscle activation with exercises. Pt shows improvement following cues within session.    Plan - 03/14/21 1640     Clinical Impression Statement Focus on balance training this session as pt reports fall while walking his dog in the dark in garden bed since last session. Pt does exhibit difficulty with uneven surfaces, frequently utilzing step-strategy and mid-guard. The pt has some difficulty with EC exercise on airex pad, requiring intermittent UE support. While pt did find many interventions challenging this session, he did see some within-session improvement with weighted agility ladder step-length exercise. The pt will benefit from further skilled PT to improve BLE strength, gait, and balance to increase ease and safety with ADLs.    Personal Factors and Comorbidities Comorbidity 3+;Time since onset of injury/illness/exacerbation    Comorbidities diabetes, hypertension, hyperlipidemia    Examination-Activity Limitations Squat;Stairs;Transfers    Examination-Participation Restrictions Church    Stability/Clinical Decision Making Evolving/Moderate complexity    Rehab Potential Poor    PT Frequency 2x / week    PT Duration 8 weeks    PT Treatment/Interventions Moist Heat;Electrical Stimulation;Gait training;Stair training;Functional mobility training;Therapeutic activities;Therapeutic exercise;Balance  training;Neuromuscular re-education;Patient/family education;Manual techniques;Passive range of motion;Energy conservation    PT Next Visit Plan Advance multiplanar, multi surface balance training; Right hip abduction, ER strength; assess mm length LEs, continue LE stretching and address back mobility, continue POC as previously indicated    PT Home Exercise Plan no updates    Consulted and Agree with Plan of Care Patient               PT Education - 03/14/21 1634     Education Details exercise technique, body mechanics    Person(s) Educated Patient    Methods Explanation;Demonstration;Verbal cues    Comprehension Verbalized understanding;Returned demonstration              PT Short Term Goals - 03/01/21 1553       PT SHORT TERM GOAL #1   Title Patient will be independent in home exercise program to improve strength/mobility for better functional independence with ADLs.    Baseline patient is compliant with all his HEP; attending water aerobics on days he's not coming to therapy.    Time 4    Period Weeks    Status Achieved    Target Date 08/03/20      PT SHORT TERM GOAL #2   Title Patient will increase RLE gross  strength to 4+/5 as to improve functional strength for independent gait, increased standing tolerance and increased ADL ability.    Baseline 12/21: RLE gross strength 4/5 2/23: see note; 4/11: grossly 5/5 BLEs; 7/18: grossly 5/5 BLEs    Time 4    Period Weeks    Status Achieved    Target Date 08/03/20               PT Long Term Goals - 02/28/21 1408       PT LONG TERM GOAL #1   Title Patient will increase FOTO score to equal to or greater than 67 to demonstrate statistically significant improvement in mobility and quality of life.    Baseline 12/21: 57 2/23: 74%, 5/23: 55%, 6/15: 57%; 7/18: 70% 8/15: 66% (previously achieved)    Time 8    Period Weeks    Status Achieved    Target Date 03/28/21      PT LONG TERM GOAL #2   Title Patient (> 49  years old) will complete five times sit to stand test in <12 seconds indicating an increased LE strength and improved balance.    Baseline 12/21: 17.65s, 01/26: 14.47s; 3/16: 13.17sec; 4/11: 8.5 sec, 5/23: 15.25 sec, 6/15: 8.25 sec with arms across chest;    Time 8    Period Weeks    Status Achieved      PT LONG TERM GOAL #3   Title Patient will increase six minute walk test distance to >1000 for progression to community ambulator and improve gait ability    Baseline 12/21: 500, 01/26: 655 ft 2/23: 735 ft; 3/16: 677f; 4/11: 737 ft, 6/15: 830 feet; 7/18: 810 ft 8/15: 865 ft    Time 8    Period Weeks    Status On-going    Target Date 03/28/21      PT LONG TERM GOAL #4   Title Patient will reduce timed up and go to <11 seconds to reduce fall risk and demonstrate improved transfer/gait ability.    Baseline 12/21: 16.78s, 01/26: 12.08s 2/23: 10 sec, 5/23: 10 sec, 6/15: 8.8 sec    Time 8    Period Weeks    Status Achieved      PT LONG TERM GOAL #5   Title Patient will increase lower extremity functional scale to >60/80 to demonstrate improved functional mobility and increased tolerance with ADLs.    Baseline 12/21: 50/80, 01/26: 62/80, 6/15:61/80    Time 8    Period Weeks    Status Achieved              Patient will benefit from skilled therapeutic intervention in order to improve the following deficits and impairments:  Abnormal gait, Decreased endurance, Impaired sensation, Decreased activity tolerance, Decreased strength, Decreased balance, Decreased mobility, Difficulty walking, Decreased range of motion, Decreased safety awareness, Improper body mechanics  Visit Diagnosis: Unsteadiness on feet  Other abnormalities of gait and mobility  Muscle weakness (generalized)   Problem List There are no problems to display for this patient.  HRicard DillonPT, DPT  03/14/2021, 4:45 PM  [facility] [address] [place] [address]Phone: [phone]  Fax:  [phone] Name: WZackarie MassariMRN: [id number] of Birth: [birth year]

## 2021-03-16 ENCOUNTER — Ambulatory Visit: Payer: Medicare Other

## 2021-03-16 ENCOUNTER — Other Ambulatory Visit: Payer: Self-pay

## 2021-03-16 DIAGNOSIS — M6281 Muscle weakness (generalized): Secondary | ICD-10-CM

## 2021-03-16 DIAGNOSIS — R2689 Other abnormalities of gait and mobility: Secondary | ICD-10-CM

## 2021-03-16 DIAGNOSIS — R2681 Unsteadiness on feet: Secondary | ICD-10-CM

## 2021-03-16 NOTE — Therapy (Signed)
[place] MAIN [facility] SERVICES [address] [place], [place], [address] Phone: [phone]   Fax:  [phone]  Physical Therapy Treatment  Patient Details  Name: Michael Cohen MRN: [id number] Date of Birth: 1941/05/29 Referring Provider (PT): [name]   Encounter Date: 03/16/2021   PT End of Session - 03/16/21 1449     Visit Number 55    Number of Visits 80    Date for PT Re-Evaluation 03/28/21    Authorization Type Medicare Parts A & B    Authorization Time Period 01/31/21-03/28/21    PT Start Time [id number]    PT Stop Time 1430    PT Time Calculation (min) 43 min    Equipment Utilized During Treatment Gait belt    Activity Tolerance Patient tolerated treatment well;No increased pain    Behavior During Therapy WFL for tasks assessed/performed             Past Medical History:  Diagnosis Date   Diabetes mellitus without complication ([place])    Hypercholesteremia    Hypertension     Past Surgical History:  Procedure Laterality Date   CATARACT EXTRACTION Bilateral    COLONOSCOPY WITH PROPOFOL N/A 12/12/2017   Procedure: COLONOSCOPY WITH PROPOFOL;  Surgeon: [name], [name], MD;  Location: [facility] ENDOSCOPY;  Service: Gastroenterology;  Laterality: N/A;   EYE SURGERY      There were no vitals filed for this visit.   Subjective Assessment - 03/16/21 1348     Subjective No major changes since previous appointment. No pain.    Pertinent History 80 y.o. 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 His pain began around a month ago and is located over the proximal lateral thigh and in the right buttocks. He describes his pain as achy more than sharp. He is not sure of any aggravating activities. He reports of having some pain at rest today. He reports associated pain or "tightness" felt along anterior and lateral portions of the lower leg. He was previously evaluated by Dr.  [name] in podiatry and diagnosed with tibialis tendinitis. He denies associated groin pain, denies radicular symptoms. He has tried Baclofen without any relief of his symptoms. Of note, the patient states that he was scheduled to go on a cruise this week but was canceled due to the pain he is experiencing. He states that he would be unable to walk around and participate fully in his vacation given his pain. An anteroposterior view of the pelvis and anteroposterior and lateral views of the right hip were obtained. Images reveal mild loss of femoral acetabular joint space with mild osteophyte formation noted. CAM deformity of the right hip noted. No fractures or dislocations. Anteroposterior, oblique, and lateral views of the lumbar spine were obtained. Images reveal no scoliosis. Mild loss of lumbar lordosis noted. Significant loss of disc space noted at L4-L5 and L5-S1. Intervertebral osteophyte formation of L4 and L5. No fractures or wedging noted. Calcification of the abdominal aorta noted. Degeneration of the lumbar facets noted. Patient was recently diagnosed with lumbar radiculoapthy due to symptoms being more consistent vs hip pathology given the absence of hip symptoms with provocative maneuvers. Prednisone tapers have been considered but is on hold for now due to patient's diabetes.    Currently in Pain? No/denies              INTERVENTIONS   Treadmill endurance training, reached 1.5 mph, HR 79  bpm, somewhat difficult to increase step length B. Shorter L LE step length compared to RLE. 5 min.  Stepping forward/backward over orange hurdle - x multiple reps, increased reliance on UUE support. Still with circumduction.  Step-ups onto 6" step - 2x15, utilizes UUE support.  SLB 2x30 sec LLE, unable without UE support, heavy reliance on BUEs  Modified SLB with one LE onto 6" step with airex, other on floor - 2x30 sec BLEs, rates easy  Floor agility ladder to promote recip stepping/gait patter  - x multiple reps - much improved from previous session  Leg press 25lb 15x. TC/hands on assist.  STS 10x  PT provides cuing throughout session in the form of VC/TC/demo to facilitate movement at target joints and correct muscle activation with exercises. Pt shows improvement following cues within session.      PT Education - 03/16/21 1448     Education Details exercise technique, body mechanics    Person(s) Educated Patient    Methods Demonstration;Tactile cues;Verbal cues;Explanation    Comprehension Verbalized understanding;Returned demonstration;Need further instruction              PT Short Term Goals - 03/01/21 1553       PT SHORT TERM GOAL #1   Title Patient will be independent in home exercise program to improve strength/mobility for better functional independence with ADLs.    Baseline patient is compliant with all his HEP; attending water aerobics on days he's not coming to therapy.    Time 4    Period Weeks    Status Achieved    Target Date 08/03/20      PT SHORT TERM GOAL #2   Title Patient will increase RLE gross strength to 4+/5 as to improve functional strength for independent gait, increased standing tolerance and increased ADL ability.    Baseline 12/21: RLE gross strength 4/5 2/23: see note; 4/11: grossly 5/5 BLEs; 7/18: grossly 5/5 BLEs    Time 4    Period Weeks    Status Achieved    Target Date 08/03/20               PT Long Term Goals - 02/28/21 1408       PT LONG TERM GOAL #1   Title Patient will increase FOTO score to equal to or greater than 67 to demonstrate statistically significant improvement in mobility and quality of life.    Baseline 12/21: 57 2/23: 74%, 5/23: 55%, 6/15: 57%; 7/18: 70% 8/15: 66% (previously achieved)    Time 8    Period Weeks    Status Achieved    Target Date 03/28/21      PT LONG TERM GOAL #2   Title Patient (> 5 years old) will complete five times sit to stand test in <12 seconds indicating an increased LE  strength and improved balance.    Baseline 12/21: 17.65s, 01/26: 14.47s; 3/16: 13.17sec; 4/11: 8.5 sec, 5/23: 15.25 sec, 6/15: 8.25 sec with arms across chest;    Time 8    Period Weeks    Status Achieved      PT LONG TERM GOAL #3   Title Patient will increase six minute walk test distance to >1000 for progression to community ambulator and improve gait ability    Baseline 12/21: 500, 01/26: 655 ft 2/23: 735 ft; 3/16: 666f; 4/11: 737 ft, 6/15: 830 feet; 7/18: 810 ft 8/15: 865 ft    Time 8    Period Weeks    Status On-going  Target Date 03/28/21      PT LONG TERM GOAL #4   Title Patient will reduce timed up and go to <11 seconds to reduce fall risk and demonstrate improved transfer/gait ability.    Baseline 12/21: 16.78s, 01/26: 12.08s 2/23: 10 sec, 5/23: 10 sec, 6/15: 8.8 sec    Time 8    Period Weeks    Status Achieved      PT LONG TERM GOAL #5   Title Patient will increase lower extremity functional scale to >60/80 to demonstrate improved functional mobility and increased tolerance with ADLs.    Baseline 12/21: 50/80, 01/26: 62/80, 6/15:61/80    Time 8    Period Weeks    Status Achieved                   Plan - 03/16/21 1449     Clinical Impression Statement Pt exhibits progress today with improved recip. stepping during agility ladder exercise with increased postural stability. While pt does show improvement, he is still very challenged with SLB tasks, requiring heavy UE weightbearing. The pt will benefit from further skilled PT to improve BLE strength, gait and balance to increase ease and safety with ADLs.    Personal Factors and Comorbidities Comorbidity 3+;Time since onset of injury/illness/exacerbation    Comorbidities diabetes, hypertension, hyperlipidemia    Examination-Activity Limitations Squat;Stairs;Transfers    Examination-Participation Restrictions [employer]    Stability/Clinical Decision Making Evolving/Moderate complexity    Rehab Potential Poor    PT  Frequency 2x / week    PT Duration 8 weeks    PT Treatment/Interventions Moist Heat;Electrical Stimulation;Gait training;Stair training;Functional mobility training;Therapeutic activities;Therapeutic exercise;Balance training;Neuromuscular re-education;Patient/family education;Manual techniques;Passive range of motion;Energy conservation    PT Next Visit Plan Advance multiplanar, multi surface balance training; Right hip abduction, ER strength; assess mm length LEs, continue LE stretching and address back mobility, continue POC as previously indicated    PT Home Exercise Plan no updates    Consulted and Agree with Plan of Care Patient             Patient will benefit from skilled therapeutic intervention in order to improve the following deficits and impairments:  Abnormal gait, Decreased endurance, Impaired sensation, Decreased activity tolerance, Decreased strength, Decreased balance, Decreased mobility, Difficulty walking, Decreased range of motion, Decreased safety awareness, Improper body mechanics  Visit Diagnosis: Other abnormalities of gait and mobility  Muscle weakness (generalized)  Unsteadiness on feet     Problem List There are no problems to display for this patient.  Ricard Dillon PT, DPT 03/16/2021, 2:55 PM  [place] MAIN [facility] SERVICES [address] [place], [place], [address] Phone: [phone]   Fax:  [phone]  Name: Michael Cohen MRN: [id number] Date of Birth: 06-27-1941

## 2021-03-23 ENCOUNTER — Ambulatory Visit: Payer: Medicare Other | Attending: Internal Medicine

## 2021-03-23 ENCOUNTER — Other Ambulatory Visit: Payer: Self-pay

## 2021-03-23 DIAGNOSIS — R2681 Unsteadiness on feet: Secondary | ICD-10-CM | POA: Insufficient documentation

## 2021-03-23 DIAGNOSIS — R278 Other lack of coordination: Secondary | ICD-10-CM | POA: Insufficient documentation

## 2021-03-23 DIAGNOSIS — M6281 Muscle weakness (generalized): Secondary | ICD-10-CM | POA: Diagnosis not present

## 2021-03-23 DIAGNOSIS — R2689 Other abnormalities of gait and mobility: Secondary | ICD-10-CM | POA: Insufficient documentation

## 2021-03-23 NOTE — Therapy (Signed)
[place] MAIN [facility] SERVICES [address] [place], [place], [address] Phone: [phone]   Fax:  [phone]  Physical Therapy Treatment  Patient Details  Name: Michael Cohen MRN: [id number] Date of Birth: 1940-09-22 Referring Provider (PT): [name]   Encounter Date: 03/23/2021   PT End of Session - 03/23/21 1709     Visit Number 59    Number of Visits 9    Date for PT Re-Evaluation 03/28/21    Authorization Type Medicare Parts A & B    Authorization Time Period 01/31/21-03/28/21    PT Start Time [id number]    PT Stop Time 1429    PT Time Calculation (min) 40 min    Equipment Utilized During Treatment Gait belt    Activity Tolerance Patient tolerated treatment well;No increased pain    Behavior During Therapy WFL for tasks assessed/performed             Past Medical History:  Diagnosis Date   Diabetes mellitus without complication ([place])    Hypercholesteremia    Hypertension     Past Surgical History:  Procedure Laterality Date   CATARACT EXTRACTION Bilateral    COLONOSCOPY WITH PROPOFOL N/A 12/12/2017   Procedure: COLONOSCOPY WITH PROPOFOL;  Surgeon: [name], [name], MD;  Location: [facility] ENDOSCOPY;  Service: Gastroenterology;  Laterality: N/A;   EYE SURGERY      There were no vitals filed for this visit.   Subjective Assessment - 03/23/21 1351     Subjective No major changes since previous appointment.  Patient reports no pain today.    Pertinent History 80 y.o. 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 His pain began around a month ago and is located over the proximal lateral thigh and in the right buttocks. He describes his pain as achy more than sharp. He is not sure of any aggravating activities. He reports of having some pain at rest today. He reports associated pain or "tightness" felt along anterior and lateral portions of the lower leg. He was previously  evaluated by [name] in podiatry and diagnosed with tibialis tendinitis. He denies associated groin pain, denies radicular symptoms. He has tried Baclofen without any relief of his symptoms. Of note, the patient states that he was scheduled to go on a cruise this week but was canceled due to the pain he is experiencing. He states that he would be unable to walk around and participate fully in his vacation given his pain. An anteroposterior view of the pelvis and anteroposterior and lateral views of the right hip were obtained. Images reveal mild loss of femoral acetabular joint space with mild osteophyte formation noted. CAM deformity of the right hip noted. No fractures or dislocations. Anteroposterior, oblique, and lateral views of the lumbar spine were obtained. Images reveal no scoliosis. Mild loss of lumbar lordosis noted. Significant loss of disc space noted at L4-L5 and L5-S1. Intervertebral osteophyte formation of L4 and L5. No fractures or wedging noted. Calcification of the abdominal aorta noted. Degeneration of the lumbar facets noted. Patient was recently diagnosed with lumbar radiculoapthy due to symptoms being more consistent vs hip pathology given the absence of hip symptoms with provocative maneuvers. Prednisone tapers have been considered but is on hold for now due to patient's diabetes.    Currently in Pain? No/denies              INTERVENTIONS   Treadmill endurance training, reached  1.5 mph, HR reached max of 92 bpm, continued difficulty increasing bilateral step length. 6 min. Pt fatigues with exercise.    Stepping forward/backward over orange hurdle - x multiple reps, uses unilateral upper extremity support, cueing to decrease reliance on upper extremities. -progressed to with 3# AW on BLEs for multiple repetitions alternating lower extremities.   STS  2x10; Reports more challenging today.  Neuromuscular reeducation-  Standing lunge position with 1 foot on DynaDisc 1 on  floor performed on bilateral lower extremities -4 x 30 seconds each side Progressed to with horizontal head turns 10 times each side in each direction  Standing on Airex pad feet together 2 x 60-seconds Standing on Airex pad feet together with eyes closed 1 x 60 seconds, very challenging for patient.  Single-leg balance 2 x 30 seconds on bilateral lower extremities, requires intermittent upper extremity support and toe tap to regain balance.   PT provides cuing throughout session in the form of VC/TC/demo to facilitate movement at target joints and correct muscle activation with exercises. Pt shows improvement following cues within session.     Note: Portions of this document were prepared using Dragon voice recognition software and although reviewed may contain unintentional dictation errors in syntax, grammar, or spelling.      PT Education - 03/23/21 1709     Education Details Exercise technique and body mechanics with hurdle stepping exercise    Person(s) Educated Patient    Methods Explanation;Demonstration;Verbal cues    Comprehension Verbalized understanding;Returned demonstration;Need further instruction              PT Short Term Goals - 03/01/21 1553       PT SHORT TERM GOAL #1   Title Patient will be independent in home exercise program to improve strength/mobility for better functional independence with ADLs.    Baseline patient is compliant with all his HEP; attending water aerobics on days he's not coming to therapy.    Time 4    Period Weeks    Status Achieved    Target Date 08/03/20      PT SHORT TERM GOAL #2   Title Patient will increase RLE gross strength to 4+/5 as to improve functional strength for independent gait, increased standing tolerance and increased ADL ability.    Baseline 12/21: RLE gross strength 4/5 2/23: see note; 4/11: grossly 5/5 BLEs; 7/18: grossly 5/5 BLEs    Time 4    Period Weeks    Status Achieved    Target Date 08/03/20                PT Long Term Goals - 02/28/21 1408       PT LONG TERM GOAL #1   Title Patient will increase FOTO score to equal to or greater than 67 to demonstrate statistically significant improvement in mobility and quality of life.    Baseline 12/21: 57 2/23: 74%, 5/23: 55%, 6/15: 57%; 7/18: 70% 8/15: 66% (previously achieved)    Time 8    Period Weeks    Status Achieved    Target Date 03/28/21      PT LONG TERM GOAL #2   Title Patient (> 22 years old) will complete five times sit to stand test in <12 seconds indicating an increased LE strength and improved balance.    Baseline 12/21: 17.65s, 01/26: 14.47s; 3/16: 13.17sec; 4/11: 8.5 sec, 5/23: 15.25 sec, 6/15: 8.25 sec with arms across chest;    Time 8    Period Weeks  Status Achieved      PT LONG TERM GOAL #3   Title Patient will increase six minute walk test distance to >1000 for progression to community ambulator and improve gait ability    Baseline 12/21: 500, 01/26: 655 ft 2/23: 735 ft; 3/16: 617f; 4/11: 737 ft, 6/15: 830 feet; 7/18: 810 ft 8/15: 865 ft    Time 8    Period Weeks    Status On-going    Target Date 03/28/21      PT LONG TERM GOAL #4   Title Patient will reduce timed up and go to <11 seconds to reduce fall risk and demonstrate improved transfer/gait ability.    Baseline 12/21: 16.78s, 01/26: 12.08s 2/23: 10 sec, 5/23: 10 sec, 6/15: 8.8 sec    Time 8    Period Weeks    Status Achieved      PT LONG TERM GOAL #5   Title Patient will increase lower extremity functional scale to >60/80 to demonstrate improved functional mobility and increased tolerance with ADLs.    Baseline 12/21: 50/80, 01/26: 62/80, 6/15:61/80    Time 8    Period Weeks    Status Achieved                   Plan - 03/23/21 1711     Clinical Impression Statement Patient progressed today on volume of sit to stands performed although he does report it is more challenging.  Patient also increased amount of time ambulated on  treadmill for lower extremity endurance.  Patient continues to fatigue quickly with endurance training.  Patient was also challenged with compliant surface balance interventions with both eyes open and eyes closed.  Patient will benefit from further skilled PT to improve strength, gait, and balance to decrease fall risk and increased ease with ADLs.    Personal Factors and Comorbidities Comorbidity 3+;Time since onset of injury/illness/exacerbation    Comorbidities diabetes, hypertension, hyperlipidemia    Examination-Activity Limitations Squat;Stairs;Transfers    Examination-Participation Restrictions [employer]    Stability/Clinical Decision Making Evolving/Moderate complexity    Rehab Potential Poor    PT Frequency 2x / week    PT Duration 8 weeks    PT Treatment/Interventions Moist Heat;Electrical Stimulation;Gait training;Stair training;Functional mobility training;Therapeutic activities;Therapeutic exercise;Balance training;Neuromuscular re-education;Patient/family education;Manual techniques;Passive range of motion;Energy conservation    PT Next Visit Plan Advance multiplanar, multi surface balance training; Right hip abduction, ER strength; assess mm length LEs, continue LE stretching and address back mobility, increase time dedicated to balance interventions    PT Home Exercise Plan no updates    Consulted and Agree with Plan of Care Patient             Patient will benefit from skilled therapeutic intervention in order to improve the following deficits and impairments:  Abnormal gait, Decreased endurance, Impaired sensation, Decreased activity tolerance, Decreased strength, Decreased balance, Decreased mobility, Difficulty walking, Decreased range of motion, Decreased safety awareness, Improper body mechanics  Visit Diagnosis: Muscle weakness (generalized)  Unsteadiness on feet  Other abnormalities of gait and mobility     Problem List There are no problems to display for this  patient.   HZollie Pee PT 03/23/2021, 5:17 PM  [place] [facility] [address] [address]Phone: [phone]  Fax:  [phone] Name: WAdynn TensleyMRN: [id number] of Birth: [birth year]

## 2021-03-28 ENCOUNTER — Ambulatory Visit: Payer: Medicare Other

## 2021-03-28 ENCOUNTER — Other Ambulatory Visit: Payer: Self-pay

## 2021-03-28 DIAGNOSIS — M6281 Muscle weakness (generalized): Secondary | ICD-10-CM

## 2021-03-28 DIAGNOSIS — R2681 Unsteadiness on feet: Secondary | ICD-10-CM

## 2021-03-28 DIAGNOSIS — R2689 Other abnormalities of gait and mobility: Secondary | ICD-10-CM

## 2021-03-28 NOTE — Therapy (Signed)
[place] MAIN [facility] SERVICES [address] [place], [place], [address] Phone: [phone]   Fax:  [phone]  Physical Therapy Treatment/RECERT  Patient Details  Name: Michael Cohen MRN: [id number] Date of Birth: 10/31/1940 Referring Provider (PT): [name]   Encounter Date: 03/28/2021   PT End of Session - 03/29/21 0833     Visit Number 75    Number of Visits 65    Date for PT Re-Evaluation 04/25/21    Authorization Type Medicare Parts A & B    Authorization Time Period 01/31/21-03/28/21    PT Start Time [id number]    PT Stop Time 1429    PT Time Calculation (min) 40 min    Equipment Utilized During Treatment Gait belt    Activity Tolerance Patient tolerated treatment well;No increased pain    Behavior During Therapy WFL for tasks assessed/performed             Past Medical History:  Diagnosis Date   Diabetes mellitus without complication ([place])    Hypercholesteremia    Hypertension     Past Surgical History:  Procedure Laterality Date   CATARACT EXTRACTION Bilateral    COLONOSCOPY WITH PROPOFOL N/A 12/12/2017   Procedure: COLONOSCOPY WITH PROPOFOL;  Surgeon: [name], [name], MD;  Location: [facility] ENDOSCOPY;  Service: Gastroenterology;  Laterality: N/A;   EYE SURGERY      There were no vitals filed for this visit.    [facility] PT Assessment - 03/28/21 1407       Standardized Balance Assessment   Standardized Balance Assessment Berg Balance Test      Berg Balance Test   Sit to Stand Able to stand without using hands and stabilize independently    Standing Unsupported Able to stand safely 2 minutes    Sitting with Back Unsupported but Feet Supported on Floor or Stool Able to sit safely and securely 2 minutes    Stand to Sit Sits safely with minimal use of hands    Transfers Able to transfer safely, minor use of hands    Standing Unsupported with Eyes Closed Able to stand 10 seconds safely    Standing Unsupported with Feet  Together Able to place feet together independently and stand 1 minute safely    From Standing, Reach Forward with Outstretched Arm Can reach forward >12 cm safely (5")    From Standing Position, Pick up Object from Floor Able to pick up shoe, needs supervision    From Standing Position, Turn to Look Behind Over each Shoulder Looks behind from both sides and weight shifts well    Turn 360 Degrees Able to turn 360 degrees safely in 4 seconds or less    Standing Unsupported, Alternately Place Feet on Step/Stool Able to stand independently and safely and complete 8 steps in 20 seconds    Standing Unsupported, One Foot in Front Needs help to step but can hold 15 seconds    Standing on One Leg Unable to try or needs assist to prevent fall    Total Score 47              Subjective Assessment - 03/29/21 0835     Subjective Patient reports no major changes since prior session.  Patient reports poor sleep.  He reports no falls or near falls.    Pertinent History 80 y.o. 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 His pain began around a month ago  and is located over the proximal lateral thigh and in the right buttocks. He describes his pain as achy more than sharp. He is not sure of any aggravating activities. He reports of having some pain at rest today. He reports associated pain or "tightness" felt along anterior and lateral portions of the lower leg. He was previously evaluated by [name] in podiatry and diagnosed with tibialis tendinitis. He denies associated groin pain, denies radicular symptoms. He has tried Baclofen without any relief of his symptoms. Of note, the patient states that he was scheduled to go on a cruise this week but was canceled due to the pain he is experiencing. He states that he would be unable to walk around and participate fully in his vacation given his pain. An anteroposterior view of the pelvis and  anteroposterior and lateral views of the right hip were obtained. Images reveal mild loss of femoral acetabular joint space with mild osteophyte formation noted. CAM deformity of the right hip noted. No fractures or dislocations. Anteroposterior, oblique, and lateral views of the lumbar spine were obtained. Images reveal no scoliosis. Mild loss of lumbar lordosis noted. Significant loss of disc space noted at L4-L5 and L5-S1. Intervertebral osteophyte formation of L4 and L5. No fractures or wedging noted. Calcification of the abdominal aorta noted. Degeneration of the lumbar facets noted. Patient was recently diagnosed with lumbar radiculoapthy due to symptoms being more consistent vs hip pathology given the absence of hip symptoms with provocative maneuvers. Prednisone tapers have been considered but is on hold for now due to patient's diabetes.    Currently in Pain? No/denies             Subjective Assessment - 03/29/21 0835     Subjective Patient reports no major changes since prior session.  Patient reports poor sleep.  He reports no falls or near falls.    Pertinent History 80 y.o. 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 His pain began around a month ago and is located over the proximal lateral thigh and in the right buttocks. He describes his pain as achy more than sharp. He is not sure of any aggravating activities. He reports of having some pain at rest today. He reports associated pain or "tightness" felt along anterior and lateral portions of the lower leg. He was previously evaluated by [name] in podiatry and diagnosed with tibialis tendinitis. He denies associated groin pain, denies radicular symptoms. He has tried Baclofen without any relief of his symptoms. Of note, the patient states that he was scheduled to go on a cruise this week but was canceled due to the pain he is experiencing. He states that he would be unable to  walk around and participate fully in his vacation given his pain. An anteroposterior view of the pelvis and anteroposterior and lateral views of the right hip were obtained. Images reveal mild loss of femoral acetabular joint space with mild osteophyte formation noted. CAM deformity of the right hip noted. No fractures or dislocations. Anteroposterior, oblique, and lateral views of the lumbar spine were obtained. Images reveal no scoliosis. Mild loss of lumbar lordosis noted. Significant loss of disc space noted at L4-L5 and L5-S1. Intervertebral osteophyte formation of L4 and L5. No fractures or wedging noted. Calcification of the abdominal aorta noted. Degeneration of the lumbar facets noted. Patient was recently diagnosed with lumbar radiculoapthy due to symptoms being more consistent vs hip pathology given the absence  of hip symptoms with provocative maneuvers. Prednisone tapers have been considered but is on hold for now due to patient's diabetes.    Currently in Pain? No/denies              INTERVENTIONS - RECERT  6MWT - 123XX123 ft (increased 5 ft)  FOTO - 69  BERG - 47/56; greatest difficulty with tandem stance and SLB   5xSTS-  Warm-up 14 sec Trial 11.36 sec (hands on legs)  Orange hurdle stepping over and back with decreasing levels of upper extremity support-multiple repetitions.  Patient unable to perform without loss of balance when attempting hands-free.  He must grab onto support bar and requires contact-guard assist from PT to prevent fall and to regain balance.  Modified SLB from airex to 6" step -2 x 30 seconds each lower extremity.  Patient requires intermittent upper extremity support.  Challenging for patient.   Note: Portions of this document were prepared using Dragon voice recognition software and although reviewed may contain unintentional dictation errors in syntax, grammar, or spelling.       PT Education - 03/29/21 0836     Education Details Reassessment of  goals.  Indications for plan of care    Person(s) Educated Patient    Methods Explanation    Comprehension Verbalized understanding              PT Short Term Goals - 03/29/21 [id number]       PT SHORT TERM GOAL #1   Title Patient will be independent in home exercise program to improve strength/mobility for better functional independence with ADLs.    Baseline patient is compliant with all his HEP; attending water aerobics on days he's not coming to therapy.    Time 4    Period Weeks    Status Achieved    Target Date 08/03/20      PT SHORT TERM GOAL #2   Title Patient will increase RLE gross strength to 4+/5 as to improve functional strength for independent gait, increased standing tolerance and increased ADL ability.    Baseline 12/21: RLE gross strength 4/5 2/23: see note; 4/11: grossly 5/5 BLEs; 7/18: grossly 5/5 BLEs    Time 4    Period Weeks    Status Achieved    Target Date 08/03/20               PT Long Term Goals - 03/29/21 0821       PT LONG TERM GOAL #1   Title Patient will increase FOTO score to equal to or greater than 67 to demonstrate statistically significant improvement in mobility and quality of life.    Baseline 12/21: 57 2/23: 74%, 5/23: 55%, 6/15: 57%; 7/18: 70% 8/15: 66% (previously achieved); 9/13 69%    Time 8    Period Weeks    Status Achieved      PT LONG TERM GOAL #2   Title Patient (> 77 years old) will complete five times sit to stand test in <12 seconds indicating an increased LE strength and improved balance.    Baseline 12/21: 17.65s, 01/26: 14.47s; 3/16: 13.17sec; 4/11: 8.5 sec, 5/23: 15.25 sec, 6/15: 8.25 sec with arms across chest; 9/12: 11.36 sec    Time 8    Period Weeks    Status Achieved      PT LONG TERM GOAL #3   Title Patient will increase six minute walk test distance to >1000 for progression to community ambulator and improve gait ability  Baseline 12/21: 500, 01/26: 655 ft 2/23: 735 ft; 3/16: 639f; 4/11: 737 ft, 6/15:  830 feet; 7/18: 810 ft 8/15: 865 ft 9/12: 870 ft    Time 8    Period Weeks    Status On-going      PT LONG TERM GOAL #4   Title Patient will reduce timed up and go to <11 seconds to reduce fall risk and demonstrate improved transfer/gait ability.    Baseline 12/21: 16.78s, 01/26: 12.08s 2/23: 10 sec, 5/23: 10 sec, 6/15: 8.8 sec    Time 8    Period Weeks    Status Achieved      PT LONG TERM GOAL #5   Title Patient will increase lower extremity functional scale to >60/80 to demonstrate improved functional mobility and increased tolerance with ADLs.    Baseline 12/21: 50/80, 01/26: 62/80, 6/15:61/80    Time 8    Period Weeks    Status Achieved      Additional Long Term Goals   Additional Long Term Goals Yes      PT LONG TERM GOAL #6   Title Patient will increase Berg Balance score by > 6 points to demonstrate decreased fall risk during functional activities.    Baseline 9/12: 47/56    Time 4    Period Weeks    Status New    Target Date 04/25/21                   Plan - 03/29/21 0825     Clinical Impression Statement Retesting of goals completed for recertification on this date.  Patient has similar performance on 6-minute walk test as compared to prior assessment, seeing only slight increase in distance ambulated during test (870 feet versus 865 feet).  Patient Foto score improved, indicating increased quality of life and functional mobility.  As patient has exhibited difficulty with balance in prior sessions, balance was further assessed.  Patient BMerrilee Janskyscore was 47 out of 56.  Patient had greatest difficulty with tandem stance and single-leg balance.  New goal has been formed to improve patient's balance.  Patient's difficulty with single-leg balance in particular places patient at increased risk of falls, as patient is unable to maintain single-leg balance without requiring upper extremity support to prevent fall.  Patient will benefit from further skilled PT to continue to  improve strength, gait, and balance in order to improve functional ability and decrease fall risk.    Personal Factors and Comorbidities Comorbidity 3+;Time since onset of injury/illness/exacerbation    Comorbidities diabetes, hypertension, hyperlipidemia    Examination-Activity Limitations Squat;Stairs;Transfers    Examination-Participation Restrictions [employer]    Stability/Clinical Decision Making Evolving/Moderate complexity    Rehab Potential Fair    PT Frequency 2x / week    PT Duration 4 weeks    PT Treatment/Interventions Moist Heat;Electrical Stimulation;Gait training;Stair training;Functional mobility training;Therapeutic activities;Therapeutic exercise;Balance training;Neuromuscular re-education;Patient/family education;Manual techniques;Passive range of motion;Energy conservation    PT Next Visit Plan Advance multiplanar, multi surface balance training; Right hip abduction, ER strength; assess mm length LEs, continue LE stretching and address back mobility, increase time dedicated to balance interventions; issue HEP for balance training at home    PT Home Exercise Plan no updates at this time    Consulted and Agree with Plan of Care Patient             Patient will benefit from skilled therapeutic intervention in order to improve the following deficits and impairments:  Abnormal gait, Decreased endurance, Impaired  sensation, Decreased activity tolerance, Decreased strength, Decreased balance, Decreased mobility, Difficulty walking, Decreased range of motion, Decreased safety awareness, Improper body mechanics  Visit Diagnosis: Other abnormalities of gait and mobility  Unsteadiness on feet  Muscle weakness (generalized)     Problem List There are no problems to display for this patient.   Zollie Pee, PT 03/29/2021, 8:36 AM  [place] [facility] SERVICES [address] [place], [place], [address] Phone: [phone]   Fax:   [phone]  Name: Jacobson Milan MRN: [id number] Date of Birth: 1941/04/21

## 2021-03-30 ENCOUNTER — Other Ambulatory Visit: Payer: Self-pay

## 2021-03-30 ENCOUNTER — Ambulatory Visit: Payer: Medicare Other

## 2021-03-30 DIAGNOSIS — M6281 Muscle weakness (generalized): Secondary | ICD-10-CM

## 2021-03-30 DIAGNOSIS — R2681 Unsteadiness on feet: Secondary | ICD-10-CM

## 2021-03-30 DIAGNOSIS — R2689 Other abnormalities of gait and mobility: Secondary | ICD-10-CM

## 2021-03-30 NOTE — Therapy (Signed)
[place] MAIN [facility] SERVICES [address] [place], [place], [address] Phone: [phone]   Fax:  [phone]  Physical Therapy Treatment  Patient Details  Name: Michael Cohen MRN: [id number] Date of Birth: [birth year] Referring Provider (PT): [name]   Encounter Date: 03/30/2021   PT End of Session - 03/30/21 1440     Visit Number [id number]    Number of Visits 65    Date for PT Re-Evaluation 04/25/21    Authorization Type Medicare Parts A & B    Authorization Time Period 01/31/21-03/28/21    PT Start Time [id number]    PT Stop Time 1430    PT Time Calculation (min) 42 min    Equipment Utilized During Treatment Gait belt    Activity Tolerance Patient tolerated treatment well;No increased pain    Behavior During Therapy WFL for tasks assessed/performed             Past Medical History:  Diagnosis Date   Diabetes mellitus without complication ([place])    Hypercholesteremia    Hypertension     Past Surgical History:  Procedure Laterality Date   CATARACT EXTRACTION Bilateral    COLONOSCOPY WITH PROPOFOL N/A 12/12/2017   Procedure: COLONOSCOPY WITH PROPOFOL;  Surgeon: [name], [name], MD;  Location: [facility] ENDOSCOPY;  Service: Gastroenterology;  Laterality: N/A;   EYE SURGERY      There were no vitals filed for this visit.   Subjective Assessment - 03/30/21 1348     Subjective Pt reports good sleep last night. Pt reports no falls or near-falls.    Pertinent History 80 y.o. 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 His pain began around a month ago and is located over the proximal lateral thigh and in the right buttocks. He describes his pain as achy more than sharp. He is not sure of any aggravating activities. He reports of having some pain at rest today. He reports associated pain or "tightness" felt along anterior and lateral portions of the lower leg. He was previously  evaluated by [name] in podiatry and diagnosed with tibialis tendinitis. He denies associated groin pain, denies radicular symptoms. He has tried Baclofen without any relief of his symptoms. Of note, the patient states that he was scheduled to go on a cruise this week but was canceled due to the pain he is experiencing. He states that he would be unable to walk around and participate fully in his vacation given his pain. An anteroposterior view of the pelvis and anteroposterior and lateral views of the right hip were obtained. Images reveal mild loss of femoral acetabular joint space with mild osteophyte formation noted. CAM deformity of the right hip noted. No fractures or dislocations. Anteroposterior, oblique, and lateral views of the lumbar spine were obtained. Images reveal no scoliosis. Mild loss of lumbar lordosis noted. Significant loss of disc space noted at L4-L5 and L5-S1. Intervertebral osteophyte formation of L4 and L5. No fractures or wedging noted. Calcification of the abdominal aorta noted. Degeneration of the lumbar facets noted. Patient was recently diagnosed with lumbar radiculoapthy due to symptoms being more consistent vs hip pathology given the absence of hip symptoms with provocative maneuvers. Prednisone tapers have been considered but is on hold for now due to patient's diabetes.    Currently in Pain? No/denies           Intervention   Treadmill  for cardiorespiratory and muscular endurance  training  -patient ambulates up to 1.9 mph, achieved a max heart rate of 88 bpm.CGA provided throughout for safety, particularly for stepping up/down from treadmill, pt relies on UE for balance.  Cueing provided for proximity to handrails and upright posture.  x8 min  STS 3x10, braces with BUEs on legs for assist intermittently   Step-ups onto 6" step - 20x alternating LEs   Orange hurdle stepping over and back with decreasing levels of upper extremity support-multiple repetitions of  each.  Patient still challenged performing hands-free.  Able to perform a couple reps stepping forward hands-free without loss of balance.  Must hold on with retrostepping.  Rates medium.  PT provides up to min assist to help patient regain balance.  Tandem stance 30 sec BLEs and semi-tandem stance 2x30 sec BLEs; some intermittent upper extremity support  Split stance with one leg on airex and 1 on floor  2 x 30 seconds each lower extremity; intermittent upper extremity support  Single leg cone taps x multiple reps each lower extremity; requires up to min assist to maintain balance due to loss of balance along with intermittent upper extremity support on bar.   Assessment: Patient progressed on endurance training by ambulating up to 8 minutes on treadmill reaching a speed of 1.9 mph and a max heart rate of 88 bpm.  Pt was noticeably fatigued with exercise. Patient rates sit to stands easily the session and is able to perform multiple repetitions.  However, patient does still intermittently rely on upper extremities to assist with completing reps.  Patient requires intermittent upper extremity support with all balance exercises performed.  He is particularly challenged with cone taps and required up to min assist to regain balance.  The patient will benefit from further skilled PT to continue to improve gait, strength, and balance to decrease fall risk and improve quality of life.  Note: Portions of this document were prepared using Dragon voice recognition software and although reviewed may contain unintentional dictation errors in syntax, grammar, or spelling.   PT Short Term Goals - 03/29/21 [id number]       PT SHORT TERM GOAL #1   Title Patient will be independent in home exercise program to improve strength/mobility for better functional independence with ADLs.    Baseline patient is compliant with all his HEP; attending water aerobics on days he's not coming to therapy.    Time 4    Period Weeks     Status Achieved    Target Date 08/03/20      PT SHORT TERM GOAL #2   Title Patient will increase RLE gross strength to 4+/5 as to improve functional strength for independent gait, increased standing tolerance and increased ADL ability.    Baseline 12/21: RLE gross strength 4/5 2/23: see note; 4/11: grossly 5/5 BLEs; 7/18: grossly 5/5 BLEs    Time 4    Period Weeks    Status Achieved    Target Date 08/03/20               PT Long Term Goals - 03/29/21 0821       PT LONG TERM GOAL #1   Title Patient will increase FOTO score to equal to or greater than 67 to demonstrate statistically significant improvement in mobility and quality of life.    Baseline 12/21: 57 2/23: 74%, 5/23: 55%, 6/15: 57%; 7/18: 70% 8/15: 66% (previously achieved); 9/13 69%    Time 8    Period Weeks    Status Achieved  PT LONG TERM GOAL #2   Title Patient (> 76 years old) will complete five times sit to stand test in <12 seconds indicating an increased LE strength and improved balance.    Baseline 12/21: 17.65s, 01/26: 14.47s; 3/16: 13.17sec; 4/11: 8.5 sec, 5/23: 15.25 sec, 6/15: 8.25 sec with arms across chest; 9/12: 11.36 sec    Time 8    Period Weeks    Status Achieved      PT LONG TERM GOAL #3   Title Patient will increase six minute walk test distance to >1000 for progression to community ambulator and improve gait ability    Baseline 12/21: 500, 01/26: 655 ft 2/23: 735 ft; 3/16: 655f; 4/11: 737 ft, 6/15: 830 feet; 7/18: 810 ft 8/15: 865 ft 9/12: 870 ft    Time 8    Period Weeks    Status On-going      PT LONG TERM GOAL #4   Title Patient will reduce timed up and go to <11 seconds to reduce fall risk and demonstrate improved transfer/gait ability.    Baseline 12/21: 16.78s, 01/26: 12.08s 2/23: 10 sec, 5/23: 10 sec, 6/15: 8.8 sec    Time 8    Period Weeks    Status Achieved      PT LONG TERM GOAL #5   Title Patient will increase lower extremity functional scale to >60/80 to demonstrate  improved functional mobility and increased tolerance with ADLs.    Baseline 12/21: 50/80, 01/26: 62/80, 6/15:61/80    Time 8    Period Weeks    Status Achieved      Additional Long Term Goals   Additional Long Term Goals Yes      PT LONG TERM GOAL #6   Title Patient will increase Berg Balance score by > 6 points to demonstrate decreased fall risk during functional activities.    Baseline 9/12: 47/56    Time 4    Period Weeks    Status New    Target Date 04/25/21                   Plan - 03/30/21 1518     Clinical Impression Statement Patient progressed on endurance training by ambulating up to 8 minutes on treadmill reaching a speed of 1.9 mph and a max heart rate of 88 bpm.  Pt was noticeably fatigued with exercise. Patient rates sit to stands easily the session and is able to perform multiple repetitions.  However, patient does still intermittently rely on upper extremities to assist with completing reps.  Patient requires intermittent upper extremity support with all balance exercises performed.  He is particularly challenged with cone taps and required up to min assist to regain balance. The patient will benefit from further skilled PT to continue to improve gait, strength, and balance to decrease fall risk and improve quality of life.    Personal Factors and Comorbidities Comorbidity 3+;Time since onset of injury/illness/exacerbation    Comorbidities diabetes, hypertension, hyperlipidemia    Examination-Activity Limitations Squat;Stairs;Transfers    Examination-Participation Restrictions Church    Stability/Clinical Decision Making Evolving/Moderate complexity    Rehab Potential Fair    PT Frequency 2x / week    PT Duration 4 weeks    PT Treatment/Interventions Moist Heat;Electrical Stimulation;Gait training;Stair training;Functional mobility training;Therapeutic activities;Therapeutic exercise;Balance training;Neuromuscular re-education;Patient/family education;Manual  techniques;Passive range of motion;Energy conservation    PT Next Visit Plan Advance multiplanar, multi surface balance training; Right hip abduction, ER strength; assess mm length LEs, continue LE stretching  and address back mobility, increase time dedicated to balance interventions; issue HEP for balance training at home    PT Home Exercise Plan no updates at this time    Consulted and Agree with Plan of Care Patient             Patient will benefit from skilled therapeutic intervention in order to improve the following deficits and impairments:  Abnormal gait, Decreased endurance, Impaired sensation, Decreased activity tolerance, Decreased strength, Decreased balance, Decreased mobility, Difficulty walking, Decreased range of motion, Decreased safety awareness, Improper body mechanics  Visit Diagnosis: Muscle weakness (generalized)  Other abnormalities of gait and mobility  Unsteadiness on feet     Problem List There are no problems to display for this patient.   Zollie Pee, PT 03/30/2021, 3:26 PM  [place] MAIN [facility] SERVICES [address] [place], [place], [address] Phone: [phone]   Fax:  [phone]  Name: Michael Cohen MRN: [id number] Date of Birth: May 19, 1941

## 2021-04-04 ENCOUNTER — Other Ambulatory Visit: Payer: Self-pay

## 2021-04-04 ENCOUNTER — Ambulatory Visit: Payer: Medicare Other

## 2021-04-04 DIAGNOSIS — R2681 Unsteadiness on feet: Secondary | ICD-10-CM

## 2021-04-04 DIAGNOSIS — M6281 Muscle weakness (generalized): Secondary | ICD-10-CM

## 2021-04-04 DIAGNOSIS — R2689 Other abnormalities of gait and mobility: Secondary | ICD-10-CM

## 2021-04-04 NOTE — Therapy (Signed)
[place] MAIN [facility] SERVICES [address] [place], [place], [address] Phone: [phone]   Fax:  [phone]  Physical Therapy Treatment  Patient Details  Name: Michael Cohen MRN: [id number] Date of Birth: 1941-03-09 Referring Provider (PT): [name]   Encounter Date: 04/04/2021   PT End of Session - 04/04/21 1439     Visit Number [id number]    Number of Visits 65    Date for PT Re-Evaluation 04/25/21    Authorization Type Medicare Parts A & B    Authorization Time Period 01/31/21-03/28/21    PT Start Time [id number]    PT Stop Time 1430    PT Time Calculation (min) 41 min    Equipment Utilized During Treatment Gait belt    Activity Tolerance Patient tolerated treatment well;No increased pain    Behavior During Therapy WFL for tasks assessed/performed             Past Medical History:  Diagnosis Date   Diabetes mellitus without complication ([place])    Hypercholesteremia    Hypertension     Past Surgical History:  Procedure Laterality Date   CATARACT EXTRACTION Bilateral    COLONOSCOPY WITH PROPOFOL N/A 12/12/2017   Procedure: COLONOSCOPY WITH PROPOFOL;  Surgeon: [name], [name], MD;  Location: [facility] ENDOSCOPY;  Service: Gastroenterology;  Laterality: N/A;   EYE SURGERY       There were no vitals filed for this visit.   Subjective Assessment - 04/04/21 1438     Subjective Patient reports he had an easier time getting up from the floor and was pleasantly surprised.  He reports he relies less on upper extremity support to get back up.    Pertinent History 80 y.o. 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 His pain began around a month ago and is located over the proximal lateral thigh and in the right buttocks. He describes his pain as achy more than sharp. He is not sure of any aggravating activities. He reports of having some pain at rest today. He reports associated pain  or "tightness" felt along anterior and lateral portions of the lower leg. He was previously evaluated by [name] in podiatry and diagnosed with tibialis tendinitis. He denies associated groin pain, denies radicular symptoms. He has tried Baclofen without any relief of his symptoms. Of note, the patient states that he was scheduled to go on a cruise this week but was canceled due to the pain he is experiencing. He states that he would be unable to walk around and participate fully in his vacation given his pain. An anteroposterior view of the pelvis and anteroposterior and lateral views of the right hip were obtained. Images reveal mild loss of femoral acetabular joint space with mild osteophyte formation noted. CAM deformity of the right hip noted. No fractures or dislocations. Anteroposterior, oblique, and lateral views of the lumbar spine were obtained. Images reveal no scoliosis. Mild loss of lumbar lordosis noted. Significant loss of disc space noted at L4-L5 and L5-S1. Intervertebral osteophyte formation of L4 and L5. No fractures or wedging noted. Calcification of the abdominal aorta noted. Degeneration of the lumbar facets noted. Patient was recently diagnosed with lumbar radiculoapthy due to symptoms being more consistent vs hip pathology given the absence of hip symptoms with provocative maneuvers. Prednisone tapers have been considered but is on hold for now due to patient's diabetes.    Currently in Pain? No/denies  Interventions   Treadmill training for cardiorespiratory and muscular endurance -patient ambulates up to 2.1 mph, achieves a max heart rate of 87 bpm. CGA provided throughout for safety, particularly for stepping up/down from treadmill, as pt relies on UE for balance.  Continued cuing provided for proximity to handrails and upright posture. x8 min; Rates moderate   STS - 3x6, pt holding 5000 gr ball; cuing for technique; pt challenged with exercise  Step-ups onto  6" step with pt wearing 2.5# weights on BLEs x multiple reps, use of UUE support required d/t postural instability.   Pt wearing 2.5# AWs on BLEs, stepping forward/backward over hurdle, initially with UE support --progressed to no UE support with forward step and UE support with backward step to add balance challenge. Pt knocks over hurdle 2x, requires min a from PT to regain balance d/t postural instability  Standing on half-bosu 3x30 sec with intermittent UE support; rates medium  SLB - 4x30 sec BLEs; toe touching intermittent upper extremity support  Tandem stance - 2x30 sec BLEs; intermittent upper extremity support  Pt educated throughout session about proper posture and technique with exercises. Improved exercise technique, movement at target joints, use of target muscles after min to mod verbal, visual, tactile cues.   Note: Portions of this document were prepared using Dragon voice recognition software and although reviewed may contain unintentional dictation errors in syntax, grammar, or spelling.    PT Education - 04/04/21 1438     Education Details Exercise technique, body mechanics with sit to stand progression    Person(s) Educated Patient    Methods Explanation;Demonstration;Verbal cues    Comprehension Verbalized understanding;Returned demonstration              PT Short Term Goals - 03/29/21 [id number]       PT SHORT TERM GOAL #1   Title Patient will be independent in home exercise program to improve strength/mobility for better functional independence with ADLs.    Baseline patient is compliant with all his HEP; attending water aerobics on days he's not coming to therapy.    Time 4    Period Weeks    Status Achieved    Target Date 08/03/20      PT SHORT TERM GOAL #2   Title Patient will increase RLE gross strength to 4+/5 as to improve functional strength for independent gait, increased standing tolerance and increased ADL ability.    Baseline 12/21: RLE gross  strength 4/5 2/23: see note; 4/11: grossly 5/5 BLEs; 7/18: grossly 5/5 BLEs    Time 4    Period Weeks    Status Achieved    Target Date 08/03/20               PT Long Term Goals - 03/29/21 0821       PT LONG TERM GOAL #1   Title Patient will increase FOTO score to equal to or greater than 67 to demonstrate statistically significant improvement in mobility and quality of life.    Baseline 12/21: 57 2/23: 74%, 5/23: 55%, 6/15: 57%; 7/18: 70% 8/15: 66% (previously achieved); 9/13 69%    Time 8    Period Weeks    Status Achieved      PT LONG TERM GOAL #2   Title Patient (> 39 years old) will complete five times sit to stand test in <12 seconds indicating an increased LE strength and improved balance.    Baseline 12/21: 17.65s, 01/26: 14.47s; 3/16: 13.17sec; 4/11: 8.5 sec, 5/23: 15.25 sec,  6/15: 8.25 sec with arms across chest; 9/12: 11.36 sec    Time 8    Period Weeks    Status Achieved      PT LONG TERM GOAL #3   Title Patient will increase six minute walk test distance to >1000 for progression to community ambulator and improve gait ability    Baseline 12/21: 500, 01/26: 655 ft 2/23: 735 ft; 3/16: 647f; 4/11: 737 ft, 6/15: 830 feet; 7/18: 810 ft 8/15: 865 ft 9/12: 870 ft    Time 8    Period Weeks    Status On-going      PT LONG TERM GOAL #4   Title Patient will reduce timed up and go to <11 seconds to reduce fall risk and demonstrate improved transfer/gait ability.    Baseline 12/21: 16.78s, 01/26: 12.08s 2/23: 10 sec, 5/23: 10 sec, 6/15: 8.8 sec    Time 8    Period Weeks    Status Achieved      PT LONG TERM GOAL #5   Title Patient will increase lower extremity functional scale to >60/80 to demonstrate improved functional mobility and increased tolerance with ADLs.    Baseline 12/21: 50/80, 01/26: 62/80, 6/15:61/80    Time 8    Period Weeks    Status Achieved      Additional Long Term Goals   Additional Long Term Goals Yes      PT LONG TERM GOAL #6   Title  Patient will increase Berg Balance score by > 6 points to demonstrate decreased fall risk during functional activities.    Baseline 9/12: 47/56    Time 4    Period Weeks    Status New    Target Date 04/25/21                   Plan - 04/04/21 1440     Clinical Impression Statement Progressed patient to more challenging sit to stand exercise with patient holding 5000 gr ball.  Patient also was able to progress to ambulating on treadmill up to 2.1 mph where his heart rate reached a max of 87 bpm.  While pt making progress, he still requires upper extremity support with all balance exercises.  The patient will benefit from further skilled PT to continue to improve balance, strength, endurance, and gait to decrease fall risk and improve quality of life.    Personal Factors and Comorbidities Comorbidity 3+;Time since onset of injury/illness/exacerbation    Comorbidities diabetes, hypertension, hyperlipidemia    Examination-Activity Limitations Squat;Stairs;Transfers    Examination-Participation Restrictions Church    Stability/Clinical Decision Making Evolving/Moderate complexity    Rehab Potential Fair    PT Frequency 2x / week    PT Duration 4 weeks    PT Treatment/Interventions Moist Heat;Electrical Stimulation;Gait training;Stair training;Functional mobility training;Therapeutic activities;Therapeutic exercise;Balance training;Neuromuscular re-education;Patient/family education;Manual techniques;Passive range of motion;Energy conservation    PT Next Visit Plan Advance multiplanar, multi surface balance training; Right hip abduction, ER strength; assess mm length LEs, continue LE stretching and address back mobility, increase time dedicated to balance interventions; issue HEP for balance training at home    PT Home Exercise Plan no updates at this time    Consulted and Agree with Plan of Care Patient             Patient will benefit from skilled therapeutic intervention in order  to improve the following deficits and impairments:  Abnormal gait, Decreased endurance, Impaired sensation, Decreased activity tolerance, Decreased strength, Decreased balance, Decreased mobility,  Difficulty walking, Decreased range of motion, Decreased safety awareness, Improper body mechanics  Visit Diagnosis: Muscle weakness (generalized)  Unsteadiness on feet  Other abnormalities of gait and mobility     Problem List There are no problems to display for this patient.   Zollie Pee, PT 04/05/2021, 8:29 AM  [place] MAIN [facility] SERVICES [address] [place], [place], [address] Phone: [phone]   Fax:  [phone]  Name: Michael Cohen MRN: [id number] Date of Birth: [birth year]

## 2021-04-06 ENCOUNTER — Other Ambulatory Visit: Payer: Self-pay

## 2021-04-06 ENCOUNTER — Ambulatory Visit: Payer: Medicare Other

## 2021-04-06 DIAGNOSIS — R278 Other lack of coordination: Secondary | ICD-10-CM

## 2021-04-06 DIAGNOSIS — M6281 Muscle weakness (generalized): Secondary | ICD-10-CM | POA: Diagnosis not present

## 2021-04-06 DIAGNOSIS — R2681 Unsteadiness on feet: Secondary | ICD-10-CM

## 2021-04-06 DIAGNOSIS — R2689 Other abnormalities of gait and mobility: Secondary | ICD-10-CM

## 2021-04-06 NOTE — Therapy (Signed)
[place] MAIN [facility] SERVICES [address] [place], [place], [address] Phone: [phone]   Fax:  [phone]  Physical Therapy Treatment/Physical Therapy Progress Note   Dates of reporting period  02/28/2021   to   04/06/2021    Patient Details  Name: Michael Cohen MRN: [id number] Date of Birth: 12/21/1940 Referring Provider (PT): [name]   Encounter Date: 04/06/2021   PT End of Session - 04/07/21 1455     Visit Number 60    Number of Visits 65    Date for PT Re-Evaluation 04/25/21    Authorization Type Medicare Parts A & B    Authorization Time Period 01/31/21-03/28/21    PT Start Time 1346    PT Stop Time 1428    PT Time Calculation (min) 42 min    Equipment Utilized During Treatment Gait belt    Activity Tolerance Patient tolerated treatment well;No increased pain    Behavior During Therapy WFL for tasks assessed/performed             Past Medical History:  Diagnosis Date   Diabetes mellitus without complication ([place])    Hypercholesteremia    Hypertension     Past Surgical History:  Procedure Laterality Date   CATARACT EXTRACTION Bilateral    COLONOSCOPY WITH PROPOFOL N/A 12/12/2017   Procedure: COLONOSCOPY WITH PROPOFOL;  Surgeon: [name], [name], MD;  Location: [facility] ENDOSCOPY;  Service: Gastroenterology;  Laterality: N/A;   EYE SURGERY      There were no vitals filed for this visit.   Subjective Assessment - 04/06/21 1345     Subjective Patient denies any recent stumbles or falls.  Patient reports no pain.  No other major changes.    Pertinent History 80 y.o. 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 His pain began around a month ago and is located over the proximal lateral thigh and in the right buttocks. He describes his pain as achy more than sharp. He is not sure of any aggravating activities. He reports of having some pain at rest today. He  reports associated pain or "tightness" felt along anterior and lateral portions of the lower leg. He was previously evaluated by [name] in podiatry and diagnosed with tibialis tendinitis. He denies associated groin pain, denies radicular symptoms. He has tried Baclofen without any relief of his symptoms. Of note, the patient states that he was scheduled to go on a cruise this week but was canceled due to the pain he is experiencing. He states that he would be unable to walk around and participate fully in his vacation given his pain. An anteroposterior view of the pelvis and anteroposterior and lateral views of the right hip were obtained. Images reveal mild loss of femoral acetabular joint space with mild osteophyte formation noted. CAM deformity of the right hip noted. No fractures or dislocations. Anteroposterior, oblique, and lateral views of the lumbar spine were obtained. Images reveal no scoliosis. Mild loss of lumbar lordosis noted. Significant loss of disc space noted at L4-L5 and L5-S1. Intervertebral osteophyte formation of L4 and L5. No fractures or wedging noted. Calcification of the abdominal aorta noted. Degeneration of the lumbar facets noted. Patient was recently diagnosed with lumbar radiculoapthy due to symptoms being more consistent vs hip pathology given the absence of hip symptoms with provocative maneuvers. Prednisone tapers have been considered but is on hold for now due to patient's diabetes.  Currently in Pain? No/denies            Interventions  Goals recently reassessed on 03/28/2021, please refer to this note for goal assessment   Treadmill training for cardiorespiratory and muscular endurance -patient ambulates up to 2.1 mph (does not progressed today to increase speed due to fatigue), achieves a max heart rate of 95 bpm. CGA provided throughout for safety, particularly for stepping up/down from treadmill, as pt continues to rely on UE for balance.  Continued cuing  provided for proximity to handrails and upright posture, which is challenging for patient. x8 min; Rates moderate   STS - 1x6, 2x8 with pt holding 5000 gr ball  Support surface with contact-guard assist: Pt wearing 3# AWs on BLEs, stepping forward/backward over hurdle, initially with UE support to emphasize strengthening of hip flexors, patient continues to compensate with circumduction and has trouble correcting even with cues.  Patient then performs stepping over hurdle/backward without ankle weights and without upper extremity support to challenge single-leg dynamic balance.  Patient must use at least unilateral upper extremity support when stepping backward due to the decreased postural stability.  Patient able to more consistently step forward without upper extremity support but does not into hurdle few times.  Patient still has difficulty with circumduction.  Patient performs multiple reps both ways. rates medium  SLB - 4x30 sec BLEs; toe touch and use of intermittent upper extremity support on bar  Standing on half-bosu 3x30 sec with, requires intermittent upper extremity support   Tandem stance - 2x30 sec BLEs; continues to require intermittent upper extremity support   Pt educated throughout session about proper posture and technique with exercises. Improved exercise technique, movement at target joints, use of target muscles after min to mod verbal, visual, tactile cues.     Note: Portions of this document were prepared using Dragon voice recognition software and although reviewed may contain unintentional dictation errors in syntax, grammar, or spelling.       PT Short Term Goals - 04/07/21 1456       PT SHORT TERM GOAL #1   Title Patient will be independent in home exercise program to improve strength/mobility for better functional independence with ADLs.    Baseline patient is compliant with all his HEP; attending water aerobics on days he's not coming to therapy.    Time 4     Period Weeks    Status Achieved    Target Date 08/03/20      PT SHORT TERM GOAL #2   Title Patient will increase RLE gross strength to 4+/5 as to improve functional strength for independent gait, increased standing tolerance and increased ADL ability.    Baseline 12/21: RLE gross strength 4/5 2/23: see note; 4/11: grossly 5/5 BLEs; 7/18: grossly 5/5 BLEs    Time 4    Period Weeks    Status Achieved    Target Date 08/03/20               PT Long Term Goals - 04/07/21 1457       PT LONG TERM GOAL #1   Title Patient will increase FOTO score to equal to or greater than 67 to demonstrate statistically significant improvement in mobility and quality of life.    Baseline 12/21: 57 2/23: 74%, 5/23: 55%, 6/15: 57%; 7/18: 70% 8/15: 66% (previously achieved); 9/13 69%    Time 8    Period Weeks    Status Achieved      PT LONG TERM GOAL #  2   Title Patient (> 10 years old) will complete five times sit to stand test in <12 seconds indicating an increased LE strength and improved balance.    Baseline 12/21: 17.65s, 01/26: 14.47s; 3/16: 13.17sec; 4/11: 8.5 sec, 5/23: 15.25 sec, 6/15: 8.25 sec with arms across chest; 9/12: 11.36 sec    Time 8    Period Weeks    Status Achieved      PT LONG TERM GOAL #3   Title Patient will increase six minute walk test distance to >1000 for progression to community ambulator and improve gait ability    Baseline 12/21: 500, 01/26: 655 ft 2/23: 735 ft; 3/16: 688ft; 4/11: 737 ft, 6/15: 830 feet; 7/18: 810 ft 8/15: 865 ft 9/12: 870 ft    Time 8    Period Weeks    Status On-going    Target Date 04/25/21      PT LONG TERM GOAL #4   Title Patient will reduce timed up and go to <11 seconds to reduce fall risk and demonstrate improved transfer/gait ability.    Baseline 12/21: 16.78s, 01/26: 12.08s 2/23: 10 sec, 5/23: 10 sec, 6/15: 8.8 sec    Time 8    Period Weeks    Status Achieved      PT LONG TERM GOAL #5   Title Patient will increase lower extremity  functional scale to >60/80 to demonstrate improved functional mobility and increased tolerance with ADLs.    Baseline 12/21: 50/80, 01/26: 62/80, 6/15:61/80    Time 8    Period Weeks    Status Achieved      PT LONG TERM GOAL #6   Title Patient will increase Berg Balance score by > 6 points to demonstrate decreased fall risk during functional activities.    Baseline 9/12: 47/56    Time 4    Period Weeks    Status On-going    Target Date 04/25/21             Patient will benefit from skilled therapeutic intervention in order to improve the following deficits and impairments:  Abnormal gait, Decreased endurance, Impaired sensation, Decreased activity tolerance, Decreased strength, Decreased balance, Decreased mobility, Difficulty walking, Decreased range of motion, Decreased safety awareness, Improper body mechanics  Visit Diagnosis: Other abnormalities of gait and mobility  Unsteadiness on feet  Muscle weakness (generalized)  Other lack of coordination     Problem List There are no problems to display for this patient.   Zollie Pee, PT 04/07/2021, 3:08 PM  [place] MAIN [facility] SERVICES [address] [place], [place], [address] Phone: [phone]   Fax:  [phone]  Name: Parks Czajkowski MRN: [id number] Date of Birth: 10-Dec-1940

## 2021-04-11 ENCOUNTER — Ambulatory Visit: Payer: Medicare Other

## 2021-04-11 ENCOUNTER — Other Ambulatory Visit: Payer: Self-pay

## 2021-04-11 DIAGNOSIS — M6281 Muscle weakness (generalized): Secondary | ICD-10-CM

## 2021-04-11 DIAGNOSIS — R2681 Unsteadiness on feet: Secondary | ICD-10-CM

## 2021-04-11 DIAGNOSIS — R2689 Other abnormalities of gait and mobility: Secondary | ICD-10-CM

## 2021-04-11 NOTE — Therapy (Signed)
[place] MAIN [facility] SERVICES [address] [place], [place], [address] Phone: [phone]   Fax:  [phone]  Physical Therapy Treatment  Patient Details  Name: Michael Cohen MRN: [id number] Date of Birth: 1941/07/04 Referring Provider (PT): [name]   Encounter Date: 04/11/2021   PT End of Session - 04/11/21 1441     Visit Number 35    Number of Visits 36    Date for PT Re-Evaluation 04/25/21    Authorization Type Medicare Parts A & B    Authorization Time Period 01/31/21-03/28/21    PT Start Time 1347    PT Stop Time 1432    PT Time Calculation (min) 45 min    Equipment Utilized During Treatment Gait belt    Activity Tolerance Patient tolerated treatment well;No increased pain    Behavior During Therapy WFL for tasks assessed/performed             Past Medical History:  Diagnosis Date   Diabetes mellitus without complication ([place])    Hypercholesteremia    Hypertension     Past Surgical History:  Procedure Laterality Date   CATARACT EXTRACTION Bilateral    COLONOSCOPY WITH PROPOFOL N/A 12/12/2017   Procedure: COLONOSCOPY WITH PROPOFOL;  Surgeon: [name], [name], MD;  Location: [facility] ENDOSCOPY;  Service: Gastroenterology;  Laterality: N/A;   EYE SURGERY      There were no vitals filed for this visit.   Subjective Assessment - 04/11/21 1438     Subjective Pt reports improvement with sleep. No major changes since prevoius session.    Pertinent History 80 y.o. 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 His pain began around a month ago and is located over the proximal lateral thigh and in the right buttocks. He describes his pain as achy more than sharp. He is not sure of any aggravating activities. He reports of having some pain at rest today. He reports associated pain or "tightness" felt along anterior and lateral portions of the lower leg. He was previously  evaluated by [name] in podiatry and diagnosed with tibialis tendinitis. He denies associated groin pain, denies radicular symptoms. He has tried Baclofen without any relief of his symptoms. Of note, the patient states that he was scheduled to go on a cruise this week but was canceled due to the pain he is experiencing. He states that he would be unable to walk around and participate fully in his vacation given his pain. An anteroposterior view of the pelvis and anteroposterior and lateral views of the right hip were obtained. Images reveal mild loss of femoral acetabular joint space with mild osteophyte formation noted. CAM deformity of the right hip noted. No fractures or dislocations. Anteroposterior, oblique, and lateral views of the lumbar spine were obtained. Images reveal no scoliosis. Mild loss of lumbar lordosis noted. Significant loss of disc space noted at L4-L5 and L5-S1. Intervertebral osteophyte formation of L4 and L5. No fractures or wedging noted. Calcification of the abdominal aorta noted. Degeneration of the lumbar facets noted. Patient was recently diagnosed with lumbar radiculoapthy due to symptoms being more consistent vs hip pathology given the absence of hip symptoms with provocative maneuvers. Prednisone tapers have been considered but is on hold for now due to patient's diabetes.    Currently in Pain? No/denies              Interventions   STS - 1x8, 1x10 with  pt holding 5000 gr ball; pt rates medium, pt has difficulty with last few reps of last set, decreased postural stability, requiring min a from PT to regain balance.  Pt wearing 4# weights on BLEs, close CGA-min a throughout Agility ladder to promote increased BLE step-length, improved stability with increased step-length - 6x Obstacle course with agility ladder with small green pad to step over as well as one orange hurdle - 8x through.  --progressed to dual task: counting backwards from 100 and naming animals, pt  must slow speed to perform  Pt then performs the following without ankle weights: Obstacle course with two hurdles, green pad to step over and airex pad to step onto and off of - 6x, significant improvement with ability to clear objects --progressed to with cones to navigate around and through 4x  Treadmill training for cardiorespiratory and muscular endurance -patient ambulates up to 2.2 mph achieves a max heart rate of 91 bpm. CGA provided throughout for safety, particularly for stepping up/down from treadmill, as pt continues to rely on UUE for balance.  Continued cuing provided for proximity to handrails and upright posture, increased B step-length and heel strike. Pt able to improve with cuing and increased speed. X 5 min. Pt fatigued at end.       Pt educated throughout session about proper posture and technique with exercises. Improved exercise technique, movement at target joints, use of target muscles after min to mod verbal, visual, tactile cues.      PT Education - 04/11/21 1440     Education Details exercise technique, body mechanics    Person(s) Educated Patient    Methods Explanation;Demonstration;Verbal cues    Comprehension Verbalized understanding;Returned demonstration;Need further instruction              PT Short Term Goals - 04/07/21 1456       PT SHORT TERM GOAL #1   Title Patient will be independent in home exercise program to improve strength/mobility for better functional independence with ADLs.    Baseline patient is compliant with all his HEP; attending water aerobics on days he's not coming to therapy.    Time 4    Period Weeks    Status Achieved    Target Date 08/03/20      PT SHORT TERM GOAL #2   Title Patient will increase RLE gross strength to 4+/5 as to improve functional strength for independent gait, increased standing tolerance and increased ADL ability.    Baseline 12/21: RLE gross strength 4/5 2/23: see note; 4/11: grossly 5/5 BLEs; 7/18:  grossly 5/5 BLEs    Time 4    Period Weeks    Status Achieved    Target Date 08/03/20               PT Long Term Goals - 04/07/21 1457       PT LONG TERM GOAL #1   Title Patient will increase FOTO score to equal to or greater than 67 to demonstrate statistically significant improvement in mobility and quality of life.    Baseline 12/21: 57 2/23: 74%, 5/23: 55%, 6/15: 57%; 7/18: 70% 8/15: 66% (previously achieved); 9/13 69%    Time 8    Period Weeks    Status Achieved      PT LONG TERM GOAL #2   Title Patient (> 27 years old) will complete five times sit to stand test in <12 seconds indicating an increased LE strength and improved balance.    Baseline 12/21: 17.65s,  01/26: 14.47s; 3/16: 13.17sec; 4/11: 8.5 sec, 5/23: 15.25 sec, 6/15: 8.25 sec with arms across chest; 9/12: 11.36 sec    Time 8    Period Weeks    Status Achieved      PT LONG TERM GOAL #3   Title Patient will increase six minute walk test distance to >1000 for progression to community ambulator and improve gait ability    Baseline 12/21: 500, 01/26: 655 ft 2/23: 735 ft; 3/16: 656ft; 4/11: 737 ft, 6/15: 830 feet; 7/18: 810 ft 8/15: 865 ft 9/12: 870 ft    Time 8    Period Weeks    Status On-going    Target Date 04/25/21      PT LONG TERM GOAL #4   Title Patient will reduce timed up and go to <11 seconds to reduce fall risk and demonstrate improved transfer/gait ability.    Baseline 12/21: 16.78s, 01/26: 12.08s 2/23: 10 sec, 5/23: 10 sec, 6/15: 8.8 sec    Time 8    Period Weeks    Status Achieved      PT LONG TERM GOAL #5   Title Patient will increase lower extremity functional scale to >60/80 to demonstrate improved functional mobility and increased tolerance with ADLs.    Baseline 12/21: 50/80, 01/26: 62/80, 6/15:61/80    Time 8    Period Weeks    Status Achieved      PT LONG TERM GOAL #6   Title Patient will increase Berg Balance score by > 6 points to demonstrate decreased fall risk during functional  activities.    Baseline 9/12: 47/56    Time 4    Period Weeks    Status On-going    Target Date 04/25/21                   Plan - 04/11/21 1435     Clinical Impression Statement Pt able to progress prior balance, gait and strength interventions into obstacle course with hurdles and agility ladder while wearing weights on ankles (4#). Pt challenged with clearing objects with weights, exhibitng some postural instability, requiring up to min a, but improved with reps. When pt then performed without weights, he was able to consistently clear hurdles without loss of balance. The pt will benefit from further skilled PT to continue to improve balance, strength, gait and endurance to decrease fall risk and increase QOL.    Personal Factors and Comorbidities Comorbidity 3+;Time since onset of injury/illness/exacerbation    Comorbidities diabetes, hypertension, hyperlipidemia    Examination-Activity Limitations Squat;Stairs;Transfers    Examination-Participation Restrictions [employer]    Stability/Clinical Decision Making Evolving/Moderate complexity    Rehab Potential Fair    PT Frequency 2x / week    PT Duration 4 weeks    PT Treatment/Interventions Moist Heat;Electrical Stimulation;Gait training;Stair training;Functional mobility training;Therapeutic activities;Therapeutic exercise;Balance training;Neuromuscular re-education;Patient/family education;Manual techniques;Passive range of motion;Energy conservation    PT Next Visit Plan obstacle course    PT Home Exercise Plan no updates at this time    Consulted and Agree with Plan of Care Patient             Patient will benefit from skilled therapeutic intervention in order to improve the following deficits and impairments:  Abnormal gait, Decreased endurance, Impaired sensation, Decreased activity tolerance, Decreased strength, Decreased balance, Decreased mobility, Difficulty walking, Decreased range of motion, Decreased safety  awareness, Improper body mechanics  Visit Diagnosis: Other abnormalities of gait and mobility  Unsteadiness on feet  Muscle weakness (generalized)  Problem List There are no problems to display for this patient.   Zollie Pee, PT 04/11/2021, 2:48 PM  [place] MAIN [facility] SERVICES [address] [place], [place], [address] Phone: [phone]   Fax:  [phone]  Name: Othell Jaime MRN: [id number] Date of Birth: 1941-01-31

## 2021-04-13 ENCOUNTER — Ambulatory Visit: Payer: Medicare Other

## 2021-04-13 ENCOUNTER — Other Ambulatory Visit: Payer: Self-pay

## 2021-04-13 DIAGNOSIS — R2689 Other abnormalities of gait and mobility: Secondary | ICD-10-CM

## 2021-04-13 DIAGNOSIS — R278 Other lack of coordination: Secondary | ICD-10-CM

## 2021-04-13 DIAGNOSIS — M6281 Muscle weakness (generalized): Secondary | ICD-10-CM | POA: Diagnosis not present

## 2021-04-13 DIAGNOSIS — R2681 Unsteadiness on feet: Secondary | ICD-10-CM

## 2021-04-13 NOTE — Therapy (Signed)
[place] MAIN [facility] SERVICES [address] [place], [place], [address] Phone: [phone]   Fax:  [phone]  Physical Therapy Treatment  Patient Details  Name: Michael Cohen MRN: [id number] Date of Birth: May 15, 1941 Referring Provider (PT): [name]   Encounter Date: 04/13/2021   PT End of Session - 04/13/21 1528     Visit Number 72    Number of Visits 65    Date for PT Re-Evaluation 04/25/21    Authorization Type Medicare Parts A & B    Authorization Time Period 01/31/21-03/28/21    PT Start Time 1350    PT Stop Time 1430    PT Time Calculation (min) 40 min    Equipment Utilized During Treatment Gait belt    Activity Tolerance Patient tolerated treatment well    Behavior During Therapy [facility] for tasks assessed/performed             Past Medical History:  Diagnosis Date   Diabetes mellitus without complication ([place])    Hypercholesteremia    Hypertension     Past Surgical History:  Procedure Laterality Date   CATARACT EXTRACTION Bilateral    COLONOSCOPY WITH PROPOFOL N/A 12/12/2017   Procedure: COLONOSCOPY WITH PROPOFOL;  Surgeon: [name], [name], MD;  Location: [facility] ENDOSCOPY;  Service: Gastroenterology;  Laterality: N/A;   EYE SURGERY      There were no vitals filed for this visit.   Subjective Assessment - 04/13/21 1351     Subjective Pt reports things are going well. Reports no pain or falls.    Pertinent History 80 y.o. 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 His pain began around a month ago and is located over the proximal lateral thigh and in the right buttocks. He describes his pain as achy more than sharp. He is not sure of any aggravating activities. He reports of having some pain at rest today. He reports associated pain or "tightness" felt along anterior and lateral portions of the lower leg. He was previously evaluated by [name] in  podiatry and diagnosed with tibialis tendinitis. He denies associated groin pain, denies radicular symptoms. He has tried Baclofen without any relief of his symptoms. Of note, the patient states that he was scheduled to go on a cruise this week but was canceled due to the pain he is experiencing. He states that he would be unable to walk around and participate fully in his vacation given his pain. An anteroposterior view of the pelvis and anteroposterior and lateral views of the right hip were obtained. Images reveal mild loss of femoral acetabular joint space with mild osteophyte formation noted. CAM deformity of the right hip noted. No fractures or dislocations. Anteroposterior, oblique, and lateral views of the lumbar spine were obtained. Images reveal no scoliosis. Mild loss of lumbar lordosis noted. Significant loss of disc space noted at L4-L5 and L5-S1. Intervertebral osteophyte formation of L4 and L5. No fractures or wedging noted. Calcification of the abdominal aorta noted. Degeneration of the lumbar facets noted. Patient was recently diagnosed with lumbar radiculoapthy due to symptoms being more consistent vs hip pathology given the absence of hip symptoms with provocative maneuvers. Prednisone tapers have been considered but is on hold for now due to patient's diabetes.    Currently in Pain? No/denies             Interventions   Treadmill training for cardiorespiratory and muscular endurance -patient  ambulates up to 2.2 mph achieves a max heart rate of 81 bpm (some difficulty with machine reading HR). CGA provided throughout for safety, particularly for stepping up/down from treadmill, as pt relies on heavy UUE weightbearing for balance down step.  Continued cuing provided for proximity to handrails and upright posture, increased B step-length and heel strike (focus on LLE).  x 9 min. Pt fatigued at end. Pt rates medium. Intensity to holding a conversation while ambulating somewhat.    STS -  2x10 with pt holding 5000 gr ball; followed by seated rest  Obstacle course with cones requiring narrow base of support, hurdle and half foam to step over 4x -- Progressed to patient wearing 5 pound ankle weights on bilateral lower extremities 6x; 2 instances of knocking into hurdle, patient obstacle clearance improved with reps --Progressed again to adding 3 balance pods at end of obstacle course where PT calls out which color pod in which foot to tap pod with for dual cognitive task incoordination exercise. 4x Comments: As task increases in complexity patient decreases gait speed, hesitates before negotiating obstacles.  3 balance pods of different colors placed in front of patient, PT calls out which balance pods to tap and which lower extremity to tap pods with.  Increases from 1 pod at a time to a sequence of pods.  As sequence increases in complexity patient with difficulty performing task.      Pt educated throughout session about proper posture and technique with exercises. Improved exercise technique, movement at target joints, use of target muscles after min to mod verbal, visual, tactile cues.      PT Short Term Goals - 04/07/21 1456       PT SHORT TERM GOAL #1   Title Patient will be independent in home exercise program to improve strength/mobility for better functional independence with ADLs.    Baseline patient is compliant with all his HEP; attending water aerobics on days he's not coming to therapy.    Time 4    Period Weeks    Status Achieved    Target Date 08/03/20      PT SHORT TERM GOAL #2   Title Patient will increase RLE gross strength to 4+/5 as to improve functional strength for independent gait, increased standing tolerance and increased ADL ability.    Baseline 12/21: RLE gross strength 4/5 2/23: see note; 4/11: grossly 5/5 BLEs; 7/18: grossly 5/5 BLEs    Time 4    Period Weeks    Status Achieved    Target Date 08/03/20               PT Long Term  Goals - 04/07/21 1457       PT LONG TERM GOAL #1   Title Patient will increase FOTO score to equal to or greater than 67 to demonstrate statistically significant improvement in mobility and quality of life.    Baseline 12/21: 57 2/23: 74%, 5/23: 55%, 6/15: 57%; 7/18: 70% 8/15: 66% (previously achieved); 9/13 69%    Time 8    Period Weeks    Status Achieved      PT LONG TERM GOAL #2   Title Patient (> 56 years old) will complete five times sit to stand test in <12 seconds indicating an increased LE strength and improved balance.    Baseline 12/21: 17.65s, 01/26: 14.47s; 3/16: 13.17sec; 4/11: 8.5 sec, 5/23: 15.25 sec, 6/15: 8.25 sec with arms across chest; 9/12: 11.36 sec    Time 8  Period Weeks    Status Achieved      PT LONG TERM GOAL #3   Title Patient will increase six minute walk test distance to >1000 for progression to community ambulator and improve gait ability    Baseline 12/21: 500, 01/26: 655 ft 2/23: 735 ft; 3/16: 615ft; 4/11: 737 ft, 6/15: 830 feet; 7/18: 810 ft 8/15: 865 ft 9/12: 870 ft    Time 8    Period Weeks    Status On-going    Target Date 04/25/21      PT LONG TERM GOAL #4   Title Patient will reduce timed up and go to <11 seconds to reduce fall risk and demonstrate improved transfer/gait ability.    Baseline 12/21: 16.78s, 01/26: 12.08s 2/23: 10 sec, 5/23: 10 sec, 6/15: 8.8 sec    Time 8    Period Weeks    Status Achieved      PT LONG TERM GOAL #5   Title Patient will increase lower extremity functional scale to >60/80 to demonstrate improved functional mobility and increased tolerance with ADLs.    Baseline 12/21: 50/80, 01/26: 62/80, 6/15:61/80    Time 8    Period Weeks    Status Achieved      PT LONG TERM GOAL #6   Title Patient will increase Berg Balance score by > 6 points to demonstrate decreased fall risk during functional activities.    Baseline 9/12: 47/56    Time 4    Period Weeks    Status On-going    Target Date 04/25/21                    Plan - 04/13/21 1528     Clinical Impression Statement Pt able to perform greater volume of reps with increased weights, indicating improved activity tolerance and LE strength. Continued increase in complexity of balance tasks as well with addition of coordination dual task. Pt had difficulty with coordination task as series of balance pods to tap increased, seeing a decrease in his postural stability as well. The pt will benefit from further skilled PT to improve balance, gait, endurance and strength to increase ease and safety with ADLs.    Personal Factors and Comorbidities Comorbidity 3+;Time since onset of injury/illness/exacerbation    Comorbidities diabetes, hypertension, hyperlipidemia    Examination-Activity Limitations Squat;Stairs;Transfers    Examination-Participation Restrictions [employer]    Stability/Clinical Decision Making Evolving/Moderate complexity    Rehab Potential Fair    PT Frequency 2x / week    PT Duration 4 weeks    PT Treatment/Interventions Moist Heat;Electrical Stimulation;Gait training;Stair training;Functional mobility training;Therapeutic activities;Therapeutic exercise;Balance training;Neuromuscular re-education;Patient/family education;Manual techniques;Passive range of motion;Energy conservation    PT Next Visit Plan obstacle course, coordination, dual task, endurance    PT Home Exercise Plan no updates at this time    Consulted and Agree with Plan of Care Patient             Patient will benefit from skilled therapeutic intervention in order to improve the following deficits and impairments:  Abnormal gait, Decreased endurance, Impaired sensation, Decreased activity tolerance, Decreased strength, Decreased balance, Decreased mobility, Difficulty walking, Decreased range of motion, Decreased safety awareness, Improper body mechanics  Visit Diagnosis: Other abnormalities of gait and mobility  Unsteadiness on feet  Muscle weakness  (generalized)  Other lack of coordination     Problem List There are no problems to display for this patient.   Zollie Pee, PT 04/13/2021, 3:33 PM    [facility] MAIN [facility] SERVICES [place], [place], [address] Phone: [phone]   Fax:  [phone]  Name: Alante Tolan MRN: [id number] Date of Birth: 09-Mar-1941

## 2021-04-18 ENCOUNTER — Ambulatory Visit: Payer: Medicare Other

## 2021-04-20 ENCOUNTER — Ambulatory Visit: Payer: Medicare Other

## 2021-04-25 ENCOUNTER — Ambulatory Visit: Payer: Medicare Other

## 2021-04-27 ENCOUNTER — Ambulatory Visit: Payer: Medicare Other

## 2021-05-02 ENCOUNTER — Ambulatory Visit: Payer: Medicare Other

## 2021-05-04 ENCOUNTER — Ambulatory Visit: Payer: Medicare Other

## 2021-05-09 ENCOUNTER — Ambulatory Visit: Payer: Medicare Other

## 2021-05-11 ENCOUNTER — Ambulatory Visit: Payer: Medicare Other | Attending: Podiatry | Admitting: Physical Therapy

## 2021-05-11 ENCOUNTER — Encounter: Payer: Self-pay | Admitting: Physical Therapy

## 2021-05-11 ENCOUNTER — Other Ambulatory Visit: Payer: Self-pay

## 2021-05-11 DIAGNOSIS — R2681 Unsteadiness on feet: Secondary | ICD-10-CM | POA: Diagnosis present

## 2021-05-11 DIAGNOSIS — R2689 Other abnormalities of gait and mobility: Secondary | ICD-10-CM | POA: Insufficient documentation

## 2021-05-11 DIAGNOSIS — M6281 Muscle weakness (generalized): Secondary | ICD-10-CM | POA: Diagnosis present

## 2021-05-11 DIAGNOSIS — R278 Other lack of coordination: Secondary | ICD-10-CM | POA: Insufficient documentation

## 2021-05-11 DIAGNOSIS — M25551 Pain in right hip: Secondary | ICD-10-CM | POA: Diagnosis present

## 2021-05-12 NOTE — Therapy (Signed)
[place] MAIN [facility] SERVICES [address] [place], [place], [address] Phone: [phone]   Fax:  [phone]  Physical Therapy Treatment  Patient Details  Name: Michael Cohen MRN: [id number] Date of Birth: April 26, 1941 Referring Provider (PT): [name]   Encounter Date: 05/11/2021   PT End of Session - 05/11/21 1341     Visit Number 5    Number of Visits 81    Date for PT Re-Evaluation 07/06/21    Authorization Type Medicare Parts A & B    Authorization Time Period 05/11/21-07/06/21    PT Start Time 1345    PT Stop Time 1430    PT Time Calculation (min) 45 min    Equipment Utilized During Treatment Gait belt    Activity Tolerance Patient tolerated treatment well    Behavior During Therapy [facility] for tasks assessed/performed             Past Medical History:  Diagnosis Date   Diabetes mellitus without complication ([place])    Hypercholesteremia    Hypertension     Past Surgical History:  Procedure Laterality Date   CATARACT EXTRACTION Bilateral    COLONOSCOPY WITH PROPOFOL N/A 12/12/2017   Procedure: COLONOSCOPY WITH PROPOFOL;  Surgeon: [name], [name], MD;  Location: [facility] ENDOSCOPY;  Service: Gastroenterology;  Laterality: N/A;   EYE SURGERY      There were no vitals filed for this visit.   Subjective Assessment - 05/11/21 1353     Subjective Patient reports doing okay. His wife was sick and in the hospital and he has been helping with her. She is now in rehab and he is wanting to resume therapy. He reports he still feels like he walks with a limp; He is still walking his dog 2-3x a day; He has not been doing [facility] [place] exercise classes but is hoping to start back again;    Pertinent History 80 y.o. 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 His pain began around a month ago and is located over the proximal lateral thigh and in the right buttocks. He describes  his pain as achy more than sharp. He is not sure of any aggravating activities. He reports of having some pain at rest today. He reports associated pain or "tightness" felt along anterior and lateral portions of the lower leg. He was previously evaluated by [name] in podiatry and diagnosed with tibialis tendinitis. He denies associated groin pain, denies radicular symptoms. He has tried Baclofen without any relief of his symptoms. Of note, the patient states that he was scheduled to go on a cruise this week but was canceled due to the pain he is experiencing. He states that he would be unable to walk around and participate fully in his vacation given his pain. An anteroposterior view of the pelvis and anteroposterior and lateral views of the right hip were obtained. Images reveal mild loss of femoral acetabular joint space with mild osteophyte formation noted. CAM deformity of the right hip noted. No fractures or dislocations. Anteroposterior, oblique, and lateral views of the lumbar spine were obtained. Images reveal no scoliosis. Mild loss of lumbar lordosis noted. Significant loss of disc space noted at L4-L5 and L5-S1. Intervertebral osteophyte formation of L4 and L5. No fractures or wedging noted. Calcification of the abdominal aorta noted. Degeneration of the lumbar facets noted. Patient was recently diagnosed with lumbar radiculoapthy due to symptoms being more consistent vs  hip pathology given the absence of hip symptoms with provocative maneuvers. Prednisone tapers have been considered but is on hold for now due to patient's diabetes.    Currently in Pain? No/denies    Multiple Pain Sites No                [facility] PT Assessment - 05/12/21 0001       ROM / Strength   AROM / PROM / Strength Strength      Strength   Overall Strength Comments RLE: hip abduction 3/5, hip adduction 3-/5, LLE: hip abduction 3+/5, adduction: 3-/5      6 minute walk test results    Aerobic Endurance Distance  Walked 615    Endurance additional comments without AD, does continue to limp      [employer]   Sit to Stand Able to stand without using hands and stabilize independently    Standing Unsupported Able to stand safely 2 minutes    Sitting with Back Unsupported but Feet Supported on Floor or Stool Able to sit safely and securely 2 minutes    Stand to Sit Sits safely with minimal use of hands    Transfers Able to transfer safely, minor use of hands    Standing Unsupported with Eyes Closed Able to stand 10 seconds safely    Standing Unsupported with Feet Together Able to place feet together independently and stand 1 minute safely    From Standing, Reach Forward with Outstretched Arm Can reach confidently >25 cm (10")    From Standing Position, Pick up Object from Floor Able to pick up shoe safely and easily    From Standing Position, Turn to Look Behind Over each Shoulder Looks behind from both sides and weight shifts well    Turn 360 Degrees Able to turn 360 degrees safely but slowly    Standing Unsupported, Alternately Place Feet on Step/Stool Able to stand independently and safely and complete 8 steps in 20 seconds    Standing Unsupported, One Foot in Front Able to take small step independently and hold 30 seconds    Standing on One Leg Tries to lift leg/unable to hold 3 seconds but remains standing independently    Total Score 49    Berg comment: improved from 03/28/21 which was 47/56               TREATMENT: PT instructed patient in outcome measures to address progress towards goals, see above;  Patient tolerated fair. He does report fatigue with prolonged ambulation. He does exhibit decline in gait ability with slower gait speed, uneven cadence/step length  Despite slight decline in gait, patient does exhibit slight improvement in balance per Merrilee Jansky Balance Assessment;  Would benefit from skilled PT Intervention to improve LE strength, balance and gait safety;                       PT Education - 05/11/21 1341     Education Details progress towards goals/recommendation;    Person(s) Educated Patient    Methods Explanation    Comprehension Verbalized understanding              PT Short Term Goals - 05/11/21 1342       PT SHORT TERM GOAL #1   Title Patient will be independent in home exercise program to improve strength/mobility for better functional independence with ADLs.    Baseline patient is compliant with all his HEP; attending water aerobics on days  he's not coming to therapy.    Time 4    Period Weeks    Status Achieved    Target Date 08/03/20      PT SHORT TERM GOAL #2   Title Patient will increase RLE gross strength to 4+/5 as to improve functional strength for independent gait, increased standing tolerance and increased ADL ability.    Baseline 12/21: RLE gross strength 4/5 2/23: see note; 4/11: grossly 5/5 BLEs; 7/18: grossly 5/5 BLEs    Time 4    Period Weeks    Status Achieved    Target Date 08/03/20               PT Long Term Goals - 05/11/21 1342       PT LONG TERM GOAL #1   Title Patient will increase FOTO score to equal to or greater than 67 to demonstrate statistically significant improvement in mobility and quality of life.    Baseline 12/21: 57 2/23: 74%, 5/23: 55%, 6/15: 57%; 7/18: 70% 8/15: 66% (previously achieved); 9/13 69%, 10/26: 61%    Time 8    Period Weeks    Status On-going    Target Date 07/06/21      PT LONG TERM GOAL #2   Title Patient (> 69 years old) will complete five times sit to stand test in <12 seconds indicating an increased LE strength and improved balance.    Baseline 12/21: 17.65s, 01/26: 14.47s; 3/16: 13.17sec; 4/11: 8.5 sec, 5/23: 15.25 sec, 6/15: 8.25 sec with arms across chest; 9/12: 11.36 sec    Time 8    Period Weeks    Status Achieved    Target Date 07/06/21      PT LONG TERM GOAL #3   Title Patient will increase six minute walk test distance to  >1000 for progression to community ambulator and improve gait ability    Baseline 12/21: 500, 01/26: 655 ft 2/23: 735 ft; 3/16: 640f; 4/11: 737 ft, 6/15: 830 feet; 7/18: 810 ft 8/15: 865 ft 9/12: 870 ft, 10/26: 610 feet;    Time 8    Period Weeks    Status On-going    Target Date 07/06/21      PT LONG TERM GOAL #4   Title Patient will reduce timed up and go to <11 seconds to reduce fall risk and demonstrate improved transfer/gait ability.    Baseline 12/21: 16.78s, 01/26: 12.08s 2/23: 10 sec, 5/23: 10 sec, 6/15: 8.8 sec    Time 8    Period Weeks    Status Achieved    Target Date 07/06/21      PT LONG TERM GOAL #5   Title Patient will increase lower extremity functional scale to >60/80 to demonstrate improved functional mobility and increased tolerance with ADLs.    Baseline 12/21: 50/80, 01/26: 62/80, 6/15:61/80    Time 8    Period Weeks    Status Achieved    Target Date 07/06/21      PT LONG TERM GOAL #6   Title Patient will increase Berg Balance score by > 6 points to demonstrate decreased fall risk during functional activities.    Baseline 9/12: 47/56, 10/26: 49/56    Time 4    Period Weeks    Status Partially Met    Target Date 07/06/21                   Plan - 05/12/21 01696    Clinical Impression Statement Patient presents to therapy  after missing 3 weeks due to his wife being sick. His wife was hospitalized. He reports he has missed his therapy appointments, not attended group class and hasn't been able to do his HEP as he has been focusing on caring for her. Patient instructed in outcome measures to address progress towards goals. He does exhibits slight decline in gait ability and sit<>Stand ability. This is likely due to missed appointments and decreased adherence to HEP as he has been caring for his wife. He reports she is doing better and he is ready to resume therapy. He ambulates with uneven cadence/step length with short stance on RLE and decreased step  length LLE. He denies any increase in pain in RLE. Patient continues to have mild weakness in RLE especially in hip abduction. He would benefit from additional skilled PT Intervention to improve strength, balance and mobility;    Personal Factors and Comorbidities Comorbidity 3+;Time since onset of injury/illness/exacerbation    Comorbidities diabetes, hypertension, hyperlipidemia    Examination-Activity Limitations Squat;Stairs;Transfers    Examination-Participation Restrictions [employer]    Stability/Clinical Decision Making Evolving/Moderate complexity    Rehab Potential Fair    PT Frequency 2x / week    PT Duration 8 weeks    PT Treatment/Interventions Moist Heat;Electrical Stimulation;Gait training;Stair training;Functional mobility training;Therapeutic activities;Therapeutic exercise;Balance training;Neuromuscular re-education;Patient/family education;Manual techniques;Passive range of motion;Energy conservation    PT Next Visit Plan obstacle course, coordination, dual task, endurance    PT Home Exercise Plan no updates at this time    Consulted and Agree with Plan of Care Patient             Patient will benefit from skilled therapeutic intervention in order to improve the following deficits and impairments:  Abnormal gait, Decreased endurance, Impaired sensation, Decreased activity tolerance, Decreased strength, Decreased balance, Decreased mobility, Difficulty walking, Decreased range of motion, Decreased safety awareness, Improper body mechanics  Visit Diagnosis: Other abnormalities of gait and mobility  Unsteadiness on feet  Muscle weakness (generalized)  Other lack of coordination  Pain in right hip     Problem List There are no problems to display for this patient.   Alanie Syler, PT, DPT 05/12/2021, 10:04 AM  [place] MAIN [facility] SERVICES [address] [place], [place], [address] Phone: [phone]   Fax:   [phone]  Name: Jiovany Scheffel MRN: [id number] Date of Birth: 1940/08/04

## 2021-05-16 ENCOUNTER — Ambulatory Visit: Payer: Medicare Other

## 2021-05-18 ENCOUNTER — Other Ambulatory Visit: Payer: Self-pay

## 2021-05-18 ENCOUNTER — Ambulatory Visit: Payer: Medicare Other | Attending: Podiatry

## 2021-05-18 DIAGNOSIS — R2681 Unsteadiness on feet: Secondary | ICD-10-CM | POA: Diagnosis present

## 2021-05-18 DIAGNOSIS — R278 Other lack of coordination: Secondary | ICD-10-CM | POA: Diagnosis present

## 2021-05-18 DIAGNOSIS — R2689 Other abnormalities of gait and mobility: Secondary | ICD-10-CM | POA: Diagnosis present

## 2021-05-18 DIAGNOSIS — M25551 Pain in right hip: Secondary | ICD-10-CM | POA: Insufficient documentation

## 2021-05-18 DIAGNOSIS — M6281 Muscle weakness (generalized): Secondary | ICD-10-CM | POA: Insufficient documentation

## 2021-05-18 NOTE — Therapy (Signed)
[place] MAIN [facility] SERVICES [address] [place], [place], [address] Phone: [phone]   Fax:  [phone]  Physical Therapy Treatment  Patient Details  Name: Michael Cohen MRN: [id number] Date of Birth: 1941/03/20 Referring Provider (PT): [name]   Encounter Date: 05/18/2021   PT End of Session - 05/18/21 1459     Visit Number 35    Number of Visits 81    Date for PT Re-Evaluation 07/06/21    Authorization Type Medicare Parts A & B    Authorization Time Period 05/11/21-07/06/21    PT Start Time 1351    PT Stop Time 1432    PT Time Calculation (min) 41 min    Equipment Utilized During Treatment Gait belt    Activity Tolerance Patient tolerated treatment well    Behavior During Therapy [facility] for tasks assessed/performed             Past Medical History:  Diagnosis Date   Diabetes mellitus without complication ([place])    Hypercholesteremia    Hypertension     Past Surgical History:  Procedure Laterality Date   CATARACT EXTRACTION Bilateral    COLONOSCOPY WITH PROPOFOL N/A 12/12/2017   Procedure: COLONOSCOPY WITH PROPOFOL;  Surgeon: [name], [name], MD;  Location: [facility] ENDOSCOPY;  Service: Gastroenterology;  Laterality: N/A;   EYE SURGERY      There were no vitals filed for this visit.   Subjective Assessment - 05/18/21 1355     Subjective Pt states "my walking has deteriorate a little." Pt reports no pain. Pt reports no issues with balance.    Pertinent History 80 y.o. 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 His pain began around a month ago and is located over the proximal lateral thigh and in the right buttocks. He describes his pain as achy more than sharp. He is not sure of any aggravating activities. He reports of having some pain at rest today. He reports associated pain or "tightness" felt along anterior and lateral portions of the lower leg. He  was previously evaluated by [name] in podiatry and diagnosed with tibialis tendinitis. He denies associated groin pain, denies radicular symptoms. He has tried Baclofen without any relief of his symptoms. Of note, the patient states that he was scheduled to go on a cruise this week but was canceled due to the pain he is experiencing. He states that he would be unable to walk around and participate fully in his vacation given his pain. An anteroposterior view of the pelvis and anteroposterior and lateral views of the right hip were obtained. Images reveal mild loss of femoral acetabular joint space with mild osteophyte formation noted. CAM deformity of the right hip noted. No fractures or dislocations. Anteroposterior, oblique, and lateral views of the lumbar spine were obtained. Images reveal no scoliosis. Mild loss of lumbar lordosis noted. Significant loss of disc space noted at L4-L5 and L5-S1. Intervertebral osteophyte formation of L4 and L5. No fractures or wedging noted. Calcification of the abdominal aorta noted. Degeneration of the lumbar facets noted. Patient was recently diagnosed with lumbar radiculoapthy due to symptoms being more consistent vs hip pathology given the absence of hip symptoms with provocative maneuvers. Prednisone tapers have been considered but is on hold for now due to patient's diabetes.    Currently in Pain? No/denies              Interventions  Treadmill training for cardiorespiratory and muscular endurance -patient ambulates up to 2.0 mph achieves a max heart rate of 90 bpm (some difficulty with machine reading HR). CGA provided throughout for safety, particularly for stepping up/down from treadmill, as pt relies on heavy UUE weightbearing for balance down step.  Pt continues to require cuing for proximity to handrails, upright posture, increased B step-length and heel strike (focus on LLE).  x 5 min 15 sec. Pt fatigued at end. Pt rates medium.   STS - 1x10 cuing  for hands-free.  -Staggered STS 10x each LE; pt rates easy to medium  Standing hip abduction with 2# ankle weights 1x10 BLEs, 2x15 BLEs  LAQ with 2# ankle weights - 1x10 each LE -progressed to LAQ with 5 sec hold at end range 1x10 each LE   Stepping over orange hurdle forward/backward with UUE and without UE support and 2# weights on each LE - x multiple reps; knocks into hurdle 3x  Pt educated throughout session about proper posture and technique with exercises. Improved exercise technique, movement at target joints, use of target muscles after min to mod verbal, visual, tactile cues      PT Education - 05/18/21 1459     Education Details exercise technique, body mechanics    Person(s) Educated Patient    Methods Explanation;Demonstration;Tactile cues;Verbal cues    Comprehension Verbalized understanding;Returned demonstration;Verbal cues required;Need further instruction              PT Short Term Goals - 05/11/21 1342       PT SHORT TERM GOAL #1   Title Patient will be independent in home exercise program to improve strength/mobility for better functional independence with ADLs.    Baseline patient is compliant with all his HEP; attending water aerobics on days he's not coming to therapy.    Time 4    Period Weeks    Status Achieved    Target Date 08/03/20      PT SHORT TERM GOAL #2   Title Patient will increase RLE gross strength to 4+/5 as to improve functional strength for independent gait, increased standing tolerance and increased ADL ability.    Baseline 12/21: RLE gross strength 4/5 2/23: see note; 4/11: grossly 5/5 BLEs; 7/18: grossly 5/5 BLEs    Time 4    Period Weeks    Status Achieved    Target Date 08/03/20               PT Long Term Goals - 05/11/21 1342       PT LONG TERM GOAL #1   Title Patient will increase FOTO score to equal to or greater than 67 to demonstrate statistically significant improvement in mobility and quality of life.     Baseline 12/21: 57 2/23: 74%, 5/23: 55%, 6/15: 57%; 7/18: 70% 8/15: 66% (previously achieved); 9/13 69%, 10/26: 61%    Time 8    Period Weeks    Status On-going    Target Date 07/06/21      PT LONG TERM GOAL #2   Title Patient (> 64 years old) will complete five times sit to stand test in <12 seconds indicating an increased LE strength and improved balance.    Baseline 12/21: 17.65s, 01/26: 14.47s; 3/16: 13.17sec; 4/11: 8.5 sec, 5/23: 15.25 sec, 6/15: 8.25 sec with arms across chest; 9/12: 11.36 sec    Time 8    Period Weeks    Status Achieved    Target Date 07/06/21  PT LONG TERM GOAL #3   Title Patient will increase six minute walk test distance to >1000 for progression to community ambulator and improve gait ability    Baseline 12/21: 500, 01/26: 655 ft 2/23: 735 ft; 3/16: 634f; 4/11: 737 ft, 6/15: 830 feet; 7/18: 810 ft 8/15: 865 ft 9/12: 870 ft, 10/26: 610 feet;    Time 8    Period Weeks    Status On-going    Target Date 07/06/21      PT LONG TERM GOAL #4   Title Patient will reduce timed up and go to <11 seconds to reduce fall risk and demonstrate improved transfer/gait ability.    Baseline 12/21: 16.78s, 01/26: 12.08s 2/23: 10 sec, 5/23: 10 sec, 6/15: 8.8 sec    Time 8    Period Weeks    Status Achieved    Target Date 07/06/21      PT LONG TERM GOAL #5   Title Patient will increase lower extremity functional scale to >60/80 to demonstrate improved functional mobility and increased tolerance with ADLs.    Baseline 12/21: 50/80, 01/26: 62/80, 6/15:61/80    Time 8    Period Weeks    Status Achieved    Target Date 07/06/21      PT LONG TERM GOAL #6   Title Patient will increase Berg Balance score by > 6 points to demonstrate decreased fall risk during functional activities.    Baseline 9/12: 47/56, 10/26: 49/56    Time 4    Period Weeks    Status Partially Met    Target Date 07/06/21                   Plan - 05/18/21 1500     Clinical Impression  Statement Continued focus on strengthenging and endurance after absence from PT. Pt was able to almost achieve past treadmilll training walking speed, but does report a quicker onset of LE fatigue. Pt was most challenged with LE clearane interventions. The pt will benefit from further skilled PT to improve strength, balance and mobility.    Personal Factors and Comorbidities Comorbidity 3+;Time since onset of injury/illness/exacerbation    Comorbidities diabetes, hypertension, hyperlipidemia    Examination-Activity Limitations Squat;Stairs;Transfers    Examination-Participation Restrictions Church    Stability/Clinical Decision Making Evolving/Moderate complexity    Rehab Potential Fair    PT Frequency 2x / week    PT Duration 8 weeks    PT Treatment/Interventions Moist Heat;Electrical Stimulation;Gait training;Stair training;Functional mobility training;Therapeutic activities;Therapeutic exercise;Balance training;Neuromuscular re-education;Patient/family education;Manual techniques;Passive range of motion;Energy conservation    PT Next Visit Plan obstacle course, coordination, dual task, endurance    PT Home Exercise Plan no updates at this time    Consulted and Agree with Plan of Care Patient             Patient will benefit from skilled therapeutic intervention in order to improve the following deficits and impairments:  Abnormal gait, Decreased endurance, Impaired sensation, Decreased activity tolerance, Decreased strength, Decreased balance, Decreased mobility, Difficulty walking, Decreased range of motion, Decreased safety awareness, Improper body mechanics  Visit Diagnosis: Muscle weakness (generalized)  Other abnormalities of gait and mobility  Unsteadiness on feet     Problem List There are no problems to display for this patient.   HZollie Pee PT 05/18/2021, 3:05 PM  [place] [facility] [address] [place]  [place] [address]Phone: [phone]  Fax:  [phone] Name: WElgie Cohen  Convey MRN: [id number] Date of Birth: 10/18/1940

## 2021-05-23 ENCOUNTER — Ambulatory Visit: Payer: Medicare Other

## 2021-05-25 ENCOUNTER — Ambulatory Visit: Payer: Medicare Other

## 2021-05-25 ENCOUNTER — Other Ambulatory Visit: Payer: Self-pay

## 2021-05-25 DIAGNOSIS — M6281 Muscle weakness (generalized): Secondary | ICD-10-CM

## 2021-05-25 DIAGNOSIS — R2689 Other abnormalities of gait and mobility: Secondary | ICD-10-CM

## 2021-05-25 DIAGNOSIS — R2681 Unsteadiness on feet: Secondary | ICD-10-CM

## 2021-05-25 NOTE — Therapy (Signed)
[place] MAIN [facility] SERVICES [address] [place], [place], [address] Phone: [phone]   Fax:  [phone]  Physical Therapy Treatment  Patient Details  Name: Michael Cohen MRN: [id number] Date of Birth: 04-26-1941 Referring Provider (PT): [name]   Encounter Date: 05/25/2021   PT End of Session - 05/25/21 1436     Visit Number 65    Number of Visits 81    Date for PT Re-Evaluation 07/06/21    Authorization Type Medicare Parts A & B    Authorization Time Period 05/11/21-07/06/21    PT Start Time 0148    PT Stop Time 0230    PT Time Calculation (min) 42 min    Equipment Utilized During Treatment Gait belt    Activity Tolerance Patient tolerated treatment well    Behavior During Therapy [facility] for tasks assessed/performed             Past Medical History:  Diagnosis Date   Diabetes mellitus without complication ([place])    Hypercholesteremia    Hypertension     Past Surgical History:  Procedure Laterality Date   CATARACT EXTRACTION Bilateral    COLONOSCOPY WITH PROPOFOL N/A 12/12/2017   Procedure: COLONOSCOPY WITH PROPOFOL;  Surgeon: [name], [name], MD;  Location: [facility] ENDOSCOPY;  Service: Gastroenterology;  Laterality: N/A;   EYE SURGERY      There were no vitals filed for this visit.   Subjective Assessment - 05/25/21 1434     Subjective Pt reports no major changes and no pain currently. Pt reports he thinks his wife will be discharged soon from rehab facility.    Pertinent History 80 y.o. 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 His pain began around a month ago and is located over the proximal lateral thigh and in the right buttocks. He describes his pain as achy more than sharp. He is not sure of any aggravating activities. He reports of having some pain at rest today. He reports associated pain or "tightness" felt along anterior and lateral portions  of the lower leg. He was previously evaluated by [name] in podiatry and diagnosed with tibialis tendinitis. He denies associated groin pain, denies radicular symptoms. He has tried Baclofen without any relief of his symptoms. Of note, the patient states that he was scheduled to go on a cruise this week but was canceled due to the pain he is experiencing. He states that he would be unable to walk around and participate fully in his vacation given his pain. An anteroposterior view of the pelvis and anteroposterior and lateral views of the right hip were obtained. Images reveal mild loss of femoral acetabular joint space with mild osteophyte formation noted. CAM deformity of the right hip noted. No fractures or dislocations. Anteroposterior, oblique, and lateral views of the lumbar spine were obtained. Images reveal no scoliosis. Mild loss of lumbar lordosis noted. Significant loss of disc space noted at L4-L5 and L5-S1. Intervertebral osteophyte formation of L4 and L5. No fractures or wedging noted. Calcification of the abdominal aorta noted. Degeneration of the lumbar facets noted. Patient was recently diagnosed with lumbar radiculoapthy due to symptoms being more consistent vs hip pathology given the absence of hip symptoms with provocative maneuvers. Prednisone tapers have been considered but is on hold for now due to patient's diabetes.    Limitations Walking;Standing;Lifting;Sitting    How long can you sit comfortably? Few hours  How long can you stand comfortably? 30 mins    How long can you walk comfortably? 1 hour    Currently in Pain? No/denies    Pain Onset More than a month ago    Pain Onset In the past 7 days            Interventions   Treadmill training for cardiorespiratory and muscular endurance -patient ambulates up to 2.2 mph and achieves a max heart rate of 90 bpm x 7.5 minutes. Pt rates at medium does report fatigue. CGA provided throughout for safety. Pt continues to require  cuing to improve B step-length, heel strike, proximity to rails and upright posture.  STS with pt holding orange weighted ball - 2x10; reports medium difficulty. Pt assumes wider stance.   At support bar with CGA throughout unless otherwise noted:  Firm surface, NBOS, EO 2x30 sec.  Tandem stance - 4x30 sec BLEs - intermittent UE support NBOS EC 2x60 sec; increased sway Standing on airex with NOBS, EO - 2x60 sec Standing on airex, EC - 2x60 sec Standing on airex, NBOS, with vertical and horizontal head turns - x multiple reps for each, greater difficulty with vertical head turns.   Stepping forward/backward over orange hurdle - forward without UE support and backward with UE support to challenge balance with foot clearance - x multiple reps.  Pt educated throughout session about proper posture and technique with exercises. Improved exercise technique, movement at target joints, use of target muscles after min to mod verbal, visual, tactile cues.      PT Education - 05/25/21 1435     Education Details exercise technique, body mechanics    Person(s) Educated Patient    Methods Explanation;Demonstration;Verbal cues    Comprehension Verbalized understanding;Returned demonstration;Verbal cues required;Need further instruction              PT Short Term Goals - 05/11/21 1342       PT SHORT TERM GOAL #1   Title Patient will be independent in home exercise program to improve strength/mobility for better functional independence with ADLs.    Baseline patient is compliant with all his HEP; attending water aerobics on days he's not coming to therapy.    Time 4    Period Weeks    Status Achieved    Target Date 08/03/20      PT SHORT TERM GOAL #2   Title Patient will increase RLE gross strength to 4+/5 as to improve functional strength for independent gait, increased standing tolerance and increased ADL ability.    Baseline 12/21: RLE gross strength 4/5 2/23: see note; 4/11: grossly  5/5 BLEs; 7/18: grossly 5/5 BLEs    Time 4    Period Weeks    Status Achieved    Target Date 08/03/20               PT Long Term Goals - 05/11/21 1342       PT LONG TERM GOAL #1   Title Patient will increase FOTO score to equal to or greater than 67 to demonstrate statistically significant improvement in mobility and quality of life.    Baseline 12/21: 57 2/23: 74%, 5/23: 55%, 6/15: 57%; 7/18: 70% 8/15: 66% (previously achieved); 9/13 69%, 10/26: 61%    Time 8    Period Weeks    Status On-going    Target Date 07/06/21      PT LONG TERM GOAL #2   Title Patient (> 40 years old) will complete five times  sit to stand test in <12 seconds indicating an increased LE strength and improved balance.    Baseline 12/21: 17.65s, 01/26: 14.47s; 3/16: 13.17sec; 4/11: 8.5 sec, 5/23: 15.25 sec, 6/15: 8.25 sec with arms across chest; 9/12: 11.36 sec    Time 8    Period Weeks    Status Achieved    Target Date 07/06/21      PT LONG TERM GOAL #3   Title Patient will increase six minute walk test distance to >1000 for progression to community ambulator and improve gait ability    Baseline 12/21: 500, 01/26: 655 ft 2/23: 735 ft; 3/16: 652f; 4/11: 737 ft, 6/15: 830 feet; 7/18: 810 ft 8/15: 865 ft 9/12: 870 ft, 10/26: 610 feet;    Time 8    Period Weeks    Status On-going    Target Date 07/06/21      PT LONG TERM GOAL #4   Title Patient will reduce timed up and go to <11 seconds to reduce fall risk and demonstrate improved transfer/gait ability.    Baseline 12/21: 16.78s, 01/26: 12.08s 2/23: 10 sec, 5/23: 10 sec, 6/15: 8.8 sec    Time 8    Period Weeks    Status Achieved    Target Date 07/06/21      PT LONG TERM GOAL #5   Title Patient will increase lower extremity functional scale to >60/80 to demonstrate improved functional mobility and increased tolerance with ADLs.    Baseline 12/21: 50/80, 01/26: 62/80, 6/15:61/80    Time 8    Period Weeks    Status Achieved    Target Date 07/06/21       PT LONG TERM GOAL #6   Title Patient will increase Berg Balance score by > 6 points to demonstrate decreased fall risk during functional activities.    Baseline 9/12: 47/56, 10/26: 49/56    Time 4    Period Weeks    Status Partially Met    Target Date 07/06/21                   Plan - 05/25/21 1438     Clinical Impression Statement Pt able to progress endurance training on treadmill to 2.2 mph, but does continue to report quick onset of fatigue with higher speeds and challenge with intervention. Pt also with difficulty performing balance interventions requiring NBOS or EC on compliant surface. The pt will benefit from further skilled PT to continue to improve endurance, strength and balance to increase ease and safety with ADLs.    Personal Factors and Comorbidities Comorbidity 3+;Time since onset of injury/illness/exacerbation    Comorbidities diabetes, hypertension, hyperlipidemia    Examination-Activity Limitations Squat;Stairs;Transfers    Examination-Participation Restrictions Church    Stability/Clinical Decision Making Evolving/Moderate complexity    Rehab Potential Fair    PT Frequency 2x / week    PT Duration 8 weeks    PT Treatment/Interventions Moist Heat;Electrical Stimulation;Gait training;Stair training;Functional mobility training;Therapeutic activities;Therapeutic exercise;Balance training;Neuromuscular re-education;Patient/family education;Manual techniques;Passive range of motion;Energy conservation    PT Next Visit Plan obstacle course, coordination, dual task, endurance    PT Home Exercise Plan no updates at this time    Consulted and Agree with Plan of Care Patient             Patient will benefit from skilled therapeutic intervention in order to improve the following deficits and impairments:  Abnormal gait, Decreased endurance, Impaired sensation, Decreased activity tolerance, Decreased strength, Decreased balance, Decreased mobility, Difficulty  walking,  Decreased range of motion, Decreased safety awareness, Improper body mechanics  Visit Diagnosis: Muscle weakness (generalized)  Unsteadiness on feet  Other abnormalities of gait and mobility     Problem List There are no problems to display for this patient.   Zollie Pee, PT 05/25/2021, 2:48 PM  [place] MAIN [facility] SERVICES [address] [place], [place], [address] Phone: [phone]   Fax:  [phone]  Name: Michael Cohen MRN: [id number] Date of Birth: [birth year]

## 2021-05-30 ENCOUNTER — Ambulatory Visit: Payer: Medicare Other

## 2021-05-30 ENCOUNTER — Other Ambulatory Visit: Payer: Self-pay

## 2021-05-30 DIAGNOSIS — M6281 Muscle weakness (generalized): Secondary | ICD-10-CM

## 2021-05-30 DIAGNOSIS — R2689 Other abnormalities of gait and mobility: Secondary | ICD-10-CM

## 2021-05-30 DIAGNOSIS — R2681 Unsteadiness on feet: Secondary | ICD-10-CM

## 2021-05-30 NOTE — Therapy (Signed)
[place] MAIN [facility] SERVICES [address] [place], [place], [address] Phone: [phone]   Fax:  [phone]  Physical Therapy Treatment  Patient Details  Name: Michael Cohen MRN: [id number] Date of Birth: Dec 04, 1940 Referring Provider (PT): [name]   Encounter Date: 05/30/2021   PT End of Session - 05/30/21 1438     Visit Number 64    Number of Visits 81    Date for PT Re-Evaluation 07/06/21    Authorization Type Medicare Parts A & B    Authorization Time Period 05/11/21-07/06/21    PT Start Time 1346    PT Stop Time 1430    PT Time Calculation (min) 44 min    Equipment Utilized During Treatment Gait belt    Activity Tolerance Patient tolerated treatment well    Behavior During Therapy [facility] for tasks assessed/performed             Past Medical History:  Diagnosis Date   Diabetes mellitus without complication ([place])    Hypercholesteremia    Hypertension     Past Surgical History:  Procedure Laterality Date   CATARACT EXTRACTION Bilateral    COLONOSCOPY WITH PROPOFOL N/A 12/12/2017   Procedure: COLONOSCOPY WITH PROPOFOL;  Surgeon: [name], [name], MD;  Location: [facility] ENDOSCOPY;  Service: Gastroenterology;  Laterality: N/A;   EYE SURGERY      There were no vitals filed for this visit.   Subjective Assessment - 05/30/21 1345     Subjective Pt reports his wife will be d/c from rehab this Thursday. Pt with no other changes reported.    Pertinent History 80 y.o. 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 His pain began around a month ago and is located over the proximal lateral thigh and in the right buttocks. He describes his pain as achy more than sharp. He is not sure of any aggravating activities. He reports of having some pain at rest today. He reports associated pain or "tightness" felt along anterior and lateral portions of the lower leg. He was  previously evaluated by [name] in podiatry and diagnosed with tibialis tendinitis. He denies associated groin pain, denies radicular symptoms. He has tried Baclofen without any relief of his symptoms. Of note, the patient states that he was scheduled to go on a cruise this week but was canceled due to the pain he is experiencing. He states that he would be unable to walk around and participate fully in his vacation given his pain. An anteroposterior view of the pelvis and anteroposterior and lateral views of the right hip were obtained. Images reveal mild loss of femoral acetabular joint space with mild osteophyte formation noted. CAM deformity of the right hip noted. No fractures or dislocations. Anteroposterior, oblique, and lateral views of the lumbar spine were obtained. Images reveal no scoliosis. Mild loss of lumbar lordosis noted. Significant loss of disc space noted at L4-L5 and L5-S1. Intervertebral osteophyte formation of L4 and L5. No fractures or wedging noted. Calcification of the abdominal aorta noted. Degeneration of the lumbar facets noted. Patient was recently diagnosed with lumbar radiculoapthy due to symptoms being more consistent vs hip pathology given the absence of hip symptoms with provocative maneuvers. Prednisone tapers have been considered but is on hold for now due to patient's diabetes.    Limitations Walking;Standing;Lifting;Sitting    How long can you sit comfortably? Few hours    How long can you stand  comfortably? 30 mins    How long can you walk comfortably? 1 hour    Currently in Pain? No/denies    Pain Onset More than a month ago    Pain Onset In the past 7 days            Interventions   Treadmill training for cardiorespiratory and muscular endurance -patient ambulates up to 2.3 mph and achieves a max heart rate of 102 bpm x 6.5 minutes. Pt rates at medium. CGA provided throughout for safety, PT also provides cuing for proximity to handrails as pt with tendency  to lean forward with increased BUE weightbearing. Pt continues to require cuing for improved foot clearance/step length but does show improvement compared to prior sessions.   Obstacle course with orange hurdle, half-foam, and 6" step, pt wearing 2.5# ankle weights on each LE. Emphasis on obstacle/foot clearance. Initially performed ambulating forward through obstacle course, cuing to alternate leading LE. Pt then performs with lateral stepping through obstacle course. Pt starts with UUE support and decreases to intermittent.   STS with pt holding orange weighted ball - 1x12, 1x10; reports difficulty.     At support bar with CGA throughout unless otherwise noted:  Firm surface: NBOS 30 sec NBOS EC 2x45 sec; increased sway NBOS with horizontal and vertical head turns x 10-15 reps for each in each direction. Slow standing marches 15x. Cuing to increase hip flexion, pt difficulty controlling eccentric component B. Very challenging.  Cone taps for SLB progression - x multiple reps each LE, intermittent UE support on bar, pt knocks over cones 4x.  Tandem stance 2x30 sec each LE  Pt educated throughout session about proper posture and technique with exercises. Improved exercise technique, movement at target joints, use of target muscles after min to mod verbal, visual, tactile cues     PT Short Term Goals - 05/11/21 1342       PT SHORT TERM GOAL #1   Title Patient will be independent in home exercise program to improve strength/mobility for better functional independence with ADLs.    Baseline patient is compliant with all his HEP; attending water aerobics on days he's not coming to therapy.    Time 4    Period Weeks    Status Achieved    Target Date 08/03/20      PT SHORT TERM GOAL #2   Title Patient will increase RLE gross strength to 4+/5 as to improve functional strength for independent gait, increased standing tolerance and increased ADL ability.    Baseline 12/21: RLE gross strength  4/5 2/23: see note; 4/11: grossly 5/5 BLEs; 7/18: grossly 5/5 BLEs    Time 4    Period Weeks    Status Achieved    Target Date 08/03/20               PT Long Term Goals - 05/11/21 1342       PT LONG TERM GOAL #1   Title Patient will increase FOTO score to equal to or greater than 67 to demonstrate statistically significant improvement in mobility and quality of life.    Baseline 12/21: 57 2/23: 74%, 5/23: 55%, 6/15: 57%; 7/18: 70% 8/15: 66% (previously achieved); 9/13 69%, 10/26: 61%    Time 8    Period Weeks    Status On-going    Target Date 07/06/21      PT LONG TERM GOAL #2   Title Patient (> 47 years old) will complete five times sit to stand test  in <12 seconds indicating an increased LE strength and improved balance.    Baseline 12/21: 17.65s, 01/26: 14.47s; 3/16: 13.17sec; 4/11: 8.5 sec, 5/23: 15.25 sec, 6/15: 8.25 sec with arms across chest; 9/12: 11.36 sec    Time 8    Period Weeks    Status Achieved    Target Date 07/06/21      PT LONG TERM GOAL #3   Title Patient will increase six minute walk test distance to >1000 for progression to community ambulator and improve gait ability    Baseline 12/21: 500, 01/26: 655 ft 2/23: 735 ft; 3/16: 619f; 4/11: 737 ft, 6/15: 830 feet; 7/18: 810 ft 8/15: 865 ft 9/12: 870 ft, 10/26: 610 feet;    Time 8    Period Weeks    Status On-going    Target Date 07/06/21      PT LONG TERM GOAL #4   Title Patient will reduce timed up and go to <11 seconds to reduce fall risk and demonstrate improved transfer/gait ability.    Baseline 12/21: 16.78s, 01/26: 12.08s 2/23: 10 sec, 5/23: 10 sec, 6/15: 8.8 sec    Time 8    Period Weeks    Status Achieved    Target Date 07/06/21      PT LONG TERM GOAL #5   Title Patient will increase lower extremity functional scale to >60/80 to demonstrate improved functional mobility and increased tolerance with ADLs.    Baseline 12/21: 50/80, 01/26: 62/80, 6/15:61/80    Time 8    Period Weeks    Status  Achieved    Target Date 07/06/21      PT LONG TERM GOAL #6   Title Patient will increase Berg Balance score by > 6 points to demonstrate decreased fall risk during functional activities.    Baseline 9/12: 47/56, 10/26: 49/56    Time 4    Period Weeks    Status Partially Met    Target Date 07/06/21                   Plan - 05/30/21 1447     Clinical Impression Statement Pt continues to progress speed ambulated with endurance training exercise, rating intervention as medium. Pt does exhibit improved gait mechanics at higher speeds, but still requires cuing for upright posture/to decrease UE weightbearing and to improve toe clearance and step-length.  While pt show sprogress, he is still challenged with all SLB progressions, foot clearance and tandem stance. The pt will benefit from further skilled PT to improve strength, endurance, and balance to decrease fall risk and increase QOL.    Personal Factors and Comorbidities Comorbidity 3+;Time since onset of injury/illness/exacerbation    Comorbidities diabetes, hypertension, hyperlipidemia    Examination-Activity Limitations Squat;Stairs;Transfers    Examination-Participation Restrictions Church    Stability/Clinical Decision Making Evolving/Moderate complexity    Rehab Potential Fair    PT Frequency 2x / week    PT Duration 8 weeks    PT Treatment/Interventions Moist Heat;Electrical Stimulation;Gait training;Stair training;Functional mobility training;Therapeutic activities;Therapeutic exercise;Balance training;Neuromuscular re-education;Patient/family education;Manual techniques;Passive range of motion;Energy conservation    PT Next Visit Plan obstacle course, coordination, dual task, endurance    PT Home Exercise Plan no updates at this time    Consulted and Agree with Plan of Care Patient             Patient will benefit from skilled therapeutic intervention in order to improve the following deficits and impairments:   Abnormal gait, Decreased endurance, Impaired sensation,  Decreased activity tolerance, Decreased strength, Decreased balance, Decreased mobility, Difficulty walking, Decreased range of motion, Decreased safety awareness, Improper body mechanics  Visit Diagnosis: Muscle weakness (generalized)  Other abnormalities of gait and mobility  Unsteadiness on feet     Problem List There are no problems to display for this patient.   Zollie Pee, PT 05/30/2021, 2:51 PM  [place] MAIN [facility] SERVICES [address] [place], [place], [address] Phone: [phone]   Fax:  [phone]  Name: Michael Cohen MRN: [id number] Date of Birth: September 07, 1940

## 2021-06-01 ENCOUNTER — Other Ambulatory Visit: Payer: Self-pay

## 2021-06-01 ENCOUNTER — Ambulatory Visit: Payer: Medicare Other

## 2021-06-01 DIAGNOSIS — M6281 Muscle weakness (generalized): Secondary | ICD-10-CM

## 2021-06-01 DIAGNOSIS — R2681 Unsteadiness on feet: Secondary | ICD-10-CM

## 2021-06-01 DIAGNOSIS — R2689 Other abnormalities of gait and mobility: Secondary | ICD-10-CM

## 2021-06-01 NOTE — Therapy (Signed)
[place] MAIN [facility] SERVICES [address] [place], [place], [address] Phone: [phone]   Fax:  [phone]  Physical Therapy Treatment  Patient Details  Name: Michael Cohen MRN: [id number] Date of Birth: Jul 25, 1940 Referring Provider (PT): [name]   Encounter Date: 06/01/2021   PT End of Session - 06/01/21 1536     Visit Number 70    Number of Visits 81    Date for PT Re-Evaluation 07/06/21    Authorization Type Medicare Parts A & B    Authorization Time Period 05/11/21-07/06/21    PT Start Time 1353    PT Stop Time 1429    PT Time Calculation (min) 36 min    Equipment Utilized During Treatment Gait belt    Activity Tolerance Patient tolerated treatment well    Behavior During Therapy [facility] for tasks assessed/performed             Past Medical History:  Diagnosis Date   Diabetes mellitus without complication ([place])    Hypercholesteremia    Hypertension     Past Surgical History:  Procedure Laterality Date   CATARACT EXTRACTION Bilateral    COLONOSCOPY WITH PROPOFOL N/A 12/12/2017   Procedure: COLONOSCOPY WITH PROPOFOL;  Surgeon: [name], [name], MD;  Location: [facility] ENDOSCOPY;  Service: Gastroenterology;  Laterality: N/A;   EYE SURGERY      There were no vitals filed for this visit.   Subjective Assessment - 06/01/21 1352     Subjective Pt reports his wife will be d/c from rehab tomorrow and coming back home. He reports he almost fell this morning while out walking his dog when he bent to pick up something from the ground. He reports he was standing on a slope when this occured.    Pertinent History 80 y.o. 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 His pain began around a month ago and is located over the proximal lateral thigh and in the right buttocks. He describes his pain as achy more than sharp. He is not sure of any aggravating activities. He  reports of having some pain at rest today. He reports associated pain or "tightness" felt along anterior and lateral portions of the lower leg. He was previously evaluated by [name] in podiatry and diagnosed with tibialis tendinitis. He denies associated groin pain, denies radicular symptoms. He has tried Baclofen without any relief of his symptoms. Of note, the patient states that he was scheduled to go on a cruise this week but was canceled due to the pain he is experiencing. He states that he would be unable to walk around and participate fully in his vacation given his pain. An anteroposterior view of the pelvis and anteroposterior and lateral views of the right hip were obtained. Images reveal mild loss of femoral acetabular joint space with mild osteophyte formation noted. CAM deformity of the right hip noted. No fractures or dislocations. Anteroposterior, oblique, and lateral views of the lumbar spine were obtained. Images reveal no scoliosis. Mild loss of lumbar lordosis noted. Significant loss of disc space noted at L4-L5 and L5-S1. Intervertebral osteophyte formation of L4 and L5. No fractures or wedging noted. Calcification of the abdominal aorta noted. Degeneration of the lumbar facets noted. Patient was recently diagnosed with lumbar radiculoapthy due to symptoms being more consistent vs hip pathology given the absence of hip symptoms with provocative maneuvers. Prednisone tapers have been considered but is on hold  for now due to patient's diabetes.    Limitations Walking;Standing;Lifting;Sitting    How long can you sit comfortably? Few hours    How long can you stand comfortably? 30 mins    How long can you walk comfortably? 1 hour    Currently in Pain? No/denies    Pain Onset More than a month ago    Pain Onset In the past 7 days             Interventions   Treadmill training for cardiorespiratory and muscular endurance -patient ambulates up to 2.3 mph and achieves a max heart rate  of 91 bpm x 6.5 minutes. Pt rates at medium. CGA provided throughout for safety, PT also continues to provide cuing for proximity to handrails, upright posture as pt with tendency to lean forward with increased BUE weightbearing.  Standing hip abduction with 5# ankle weights on each LE 3x10 each LE  Standing on ariex pad at support surface with CGA:  NBOS EO 60 sec NBOS EC 3x60 sec; use of UE support x1 to regain balance, PT also provides up to min a due to posterior LOB to assist pt in regaining balance; rates medium Semi-tandem stance 4x30 sec each LE - easy-medium  Stepping over orange hurdle forward/backward, without UE support stepping forward and with UE support stepping backward - x multiple reps   Pt educated throughout session about proper posture and technique with exercises. Improved exercise technique, movement at target joints, use of target muscles after min to mod verbal, visual, tactile cues   PT Education - 06/01/21 1536     Education Details exercise technique, body mechanics    Person(s) Educated Patient    Methods Explanation;Demonstration;Verbal cues    Comprehension Verbalized understanding;Returned demonstration;Verbal cues required;Need further instruction              PT Short Term Goals - 05/11/21 1342       PT SHORT TERM GOAL #1   Title Patient will be independent in home exercise program to improve strength/mobility for better functional independence with ADLs.    Baseline patient is compliant with all his HEP; attending water aerobics on days he's not coming to therapy.    Time 4    Period Weeks    Status Achieved    Target Date 08/03/20      PT SHORT TERM GOAL #2   Title Patient will increase RLE gross strength to 4+/5 as to improve functional strength for independent gait, increased standing tolerance and increased ADL ability.    Baseline 12/21: RLE gross strength 4/5 2/23: see note; 4/11: grossly 5/5 BLEs; 7/18: grossly 5/5 BLEs    Time 4     Period Weeks    Status Achieved    Target Date 08/03/20               PT Long Term Goals - 05/11/21 1342       PT LONG TERM GOAL #1   Title Patient will increase FOTO score to equal to or greater than 67 to demonstrate statistically significant improvement in mobility and quality of life.    Baseline 12/21: 57 2/23: 74%, 5/23: 55%, 6/15: 57%; 7/18: 70% 8/15: 66% (previously achieved); 9/13 69%, 10/26: 61%    Time 8    Period Weeks    Status On-going    Target Date 07/06/21      PT LONG TERM GOAL #2   Title Patient (> 72 years old) will complete five times sit  to stand test in <12 seconds indicating an increased LE strength and improved balance.    Baseline 12/21: 17.65s, 01/26: 14.47s; 3/16: 13.17sec; 4/11: 8.5 sec, 5/23: 15.25 sec, 6/15: 8.25 sec with arms across chest; 9/12: 11.36 sec    Time 8    Period Weeks    Status Achieved    Target Date 07/06/21      PT LONG TERM GOAL #3   Title Patient will increase six minute walk test distance to >1000 for progression to community ambulator and improve gait ability    Baseline 12/21: 500, 01/26: 655 ft 2/23: 735 ft; 3/16: 611f; 4/11: 737 ft, 6/15: 830 feet; 7/18: 810 ft 8/15: 865 ft 9/12: 870 ft, 10/26: 610 feet;    Time 8    Period Weeks    Status On-going    Target Date 07/06/21      PT LONG TERM GOAL #4   Title Patient will reduce timed up and go to <11 seconds to reduce fall risk and demonstrate improved transfer/gait ability.    Baseline 12/21: 16.78s, 01/26: 12.08s 2/23: 10 sec, 5/23: 10 sec, 6/15: 8.8 sec    Time 8    Period Weeks    Status Achieved    Target Date 07/06/21      PT LONG TERM GOAL #5   Title Patient will increase lower extremity functional scale to >60/80 to demonstrate improved functional mobility and increased tolerance with ADLs.    Baseline 12/21: 50/80, 01/26: 62/80, 6/15:61/80    Time 8    Period Weeks    Status Achieved    Target Date 07/06/21      PT LONG TERM GOAL #6   Title Patient  will increase Berg Balance score by > 6 points to demonstrate decreased fall risk during functional activities.    Baseline 9/12: 47/56, 10/26: 49/56    Time 4    Period Weeks    Status Partially Met    Target Date 07/06/21                   Plan - 06/01/21 1537     Clinical Impression Statement Pt motivated to participate in session even though the pt is slightly more fatigued today compared to previous appointment. Pt was still able to ambulate up to 2.3 mph with HR reaching 91 bpm. Remainder of session focused mostly on balance as pt reports near-fall this morning. Pt overall rates balance on compliant surfaces as a moderate challenge and exhibits some difficulty with using vestibular cues to maintain balance. The pt will benefit from further skilled PT to improve strength, balance, endurance and gait to increase ease and safety with ADLs.    Personal Factors and Comorbidities Comorbidity 3+;Time since onset of injury/illness/exacerbation    Comorbidities diabetes, hypertension, hyperlipidemia    Examination-Activity Limitations Squat;Stairs;Transfers    Examination-Participation Restrictions Church    Stability/Clinical Decision Making Evolving/Moderate complexity    Rehab Potential Fair    PT Frequency 2x / week    PT Duration 8 weeks    PT Treatment/Interventions Moist Heat;Electrical Stimulation;Gait training;Stair training;Functional mobility training;Therapeutic activities;Therapeutic exercise;Balance training;Neuromuscular re-education;Patient/family education;Manual techniques;Passive range of motion;Energy conservation    PT Next Visit Plan obstacle course, coordination, dual task, endurance, continue POC as previously indicated    PT Home Exercise Plan no updates at this time    Consulted and Agree with Plan of Care Patient             Patient will benefit from  skilled therapeutic intervention in order to improve the following deficits and impairments:  Abnormal  gait, Decreased endurance, Impaired sensation, Decreased activity tolerance, Decreased strength, Decreased balance, Decreased mobility, Difficulty walking, Decreased range of motion, Decreased safety awareness, Improper body mechanics  Visit Diagnosis: Muscle weakness (generalized)  Unsteadiness on feet  Other abnormalities of gait and mobility     Problem List There are no problems to display for this patient.   Zollie Pee, PT 06/01/2021, 3:46 PM  [place] MAIN [facility] SERVICES [address] [place], [place], [address] Phone: [phone]   Fax:  [phone]  Name: Michael Cohen MRN: [id number] Date of Birth: 1941-01-20

## 2021-06-06 ENCOUNTER — Other Ambulatory Visit: Payer: Self-pay

## 2021-06-06 ENCOUNTER — Ambulatory Visit: Payer: Medicare Other

## 2021-06-06 DIAGNOSIS — R2681 Unsteadiness on feet: Secondary | ICD-10-CM

## 2021-06-06 DIAGNOSIS — M6281 Muscle weakness (generalized): Secondary | ICD-10-CM | POA: Diagnosis not present

## 2021-06-06 DIAGNOSIS — R2689 Other abnormalities of gait and mobility: Secondary | ICD-10-CM

## 2021-06-06 DIAGNOSIS — R278 Other lack of coordination: Secondary | ICD-10-CM

## 2021-06-06 NOTE — Therapy (Signed)
[place] MAIN [facility] SERVICES [address] [place], [place], [address] Phone: [phone]   Fax:  [phone]  Physical Therapy Treatment  Patient Details  Name: Michael Cohen MRN: [id number] Date of Birth: [birth year] Referring Provider (PT): [name]   Encounter Date: 06/06/2021   PT End of Session - 06/06/21 1538     Visit Number 38    Number of Visits 81    Date for PT Re-Evaluation 07/06/21    Authorization Type Medicare Parts A & B    Authorization Time Period 05/11/21-07/06/21    PT Start Time 1346    PT Stop Time 1430    PT Time Calculation (min) 44 min    Equipment Utilized During Treatment Gait belt    Activity Tolerance Patient tolerated treatment well    Behavior During Therapy [facility] for tasks assessed/performed             Past Medical History:  Diagnosis Date   Diabetes mellitus without complication ([place])    Hypercholesteremia    Hypertension     Past Surgical History:  Procedure Laterality Date   CATARACT EXTRACTION Bilateral    COLONOSCOPY WITH PROPOFOL N/A 12/12/2017   Procedure: COLONOSCOPY WITH PROPOFOL;  Surgeon: [name], [name], MD;  Location: [facility] ENDOSCOPY;  Service: Gastroenterology;  Laterality: N/A;   EYE SURGERY      There were no vitals filed for this visit.   Subjective Assessment - 06/06/21 1351     Subjective Pt reports no pain and denies falls/LOB.    Pertinent History 80 y.o. 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 His pain began around a month ago and is located over the proximal lateral thigh and in the right buttocks. He describes his pain as achy more than sharp. He is not sure of any aggravating activities. He reports of having some pain at rest today. He reports associated pain or "tightness" felt along anterior and lateral portions of the lower leg. He was previously evaluated by [name] in podiatry and diagnosed  with tibialis tendinitis. He denies associated groin pain, denies radicular symptoms. He has tried Baclofen without any relief of his symptoms. Of note, the patient states that he was scheduled to go on a cruise this week but was canceled due to the pain he is experiencing. He states that he would be unable to walk around and participate fully in his vacation given his pain. An anteroposterior view of the pelvis and anteroposterior and lateral views of the right hip were obtained. Images reveal mild loss of femoral acetabular joint space with mild osteophyte formation noted. CAM deformity of the right hip noted. No fractures or dislocations. Anteroposterior, oblique, and lateral views of the lumbar spine were obtained. Images reveal no scoliosis. Mild loss of lumbar lordosis noted. Significant loss of disc space noted at L4-L5 and L5-S1. Intervertebral osteophyte formation of L4 and L5. No fractures or wedging noted. Calcification of the abdominal aorta noted. Degeneration of the lumbar facets noted. Patient was recently diagnosed with lumbar radiculoapthy due to symptoms being more consistent vs hip pathology given the absence of hip symptoms with provocative maneuvers. Prednisone tapers have been considered but is on hold for now due to patient's diabetes.    Limitations Walking;Standing;Lifting;Sitting    How long can you sit comfortably? Few hours    How long can you stand comfortably? 30 mins    How long can you  walk comfortably? 1 hour    Pain Onset More than a month ago    Pain Onset In the past 7 days              INTERVENTIONS - CGA provided for all the following interventions unless otherwise indicated  Treadmill training for cardiorespiratory and muscular endurance -patient ambulates up to 2.1 mph and achieves a max heart rate of 93 bpm x 7.5 minutes. Pt exhibits and reports fatigue with intervention, requesting to not increase speed higher than 2.1 mph today. CGA provided throughout for  safety, PT also continues to provide cuing for proximity to handrails, upright posture, increased step length and heel strike (emphasis on LLE). Pt reports BUEs are fatigued. Pt requires brief rest interval following treadmill training and is monitored throughout for exercise response.   Ambulation x several minutes with focus on improved gait mechanics/sequencing, cuing and demo for increased B step-length, increased B arm-swing, heel strike (particularly on L) and improved eccentric control of dorsiflexors following initial contact into stance phase.   In hallway with incline/decline, pt ambulates up and down to challenge dynamic balance with continued cuing for improved step-length B (pt with difficulty correcting, exhibits decreased B step-length) 4x.  --Pt then performs intervention with horizontal head turns 2x length of hallway --Pt then performs intervention with vertical head turns 2x length of hallway Comments: Pt shows further decrease in gait speed and step length when incorporating head turns, and pt reports increased difficulty with intervention.   Picking up cones and hedgehogs from floor with WBOS and NBOS modified squat/deadlift technique - 6x. No LOB, however pt with tendency to assume wider BOS to improve steadiness.   Stepping forward/backward over orange hurdle without UE support - 20x. No LOB, pt clears hurdle 100%, only one instance of step-strategy when stepping backwards, but pt able to regain balance independently.    At support surface, pt standing on airex, NBOS EC 3x30 sec. Exhibits decreased postural stability (increased ankle righting) but no LOB.   Pt educated throughout session about proper posture and technique with exercises. Improved exercise technique, movement at target joints, use of target muscles after min to mod verbal, visual, tactile cues.   PT Education - 06/06/21 1537     Education Details exercise technique, body mechanics focus on lifting technique     Person(s) Educated Patient    Methods Explanation;Demonstration;Verbal cues    Comprehension Verbalized understanding;Returned demonstration;Need further instruction              PT Short Term Goals - 05/11/21 1342       PT SHORT TERM GOAL #1   Title Patient will be independent in home exercise program to improve strength/mobility for better functional independence with ADLs.    Baseline patient is compliant with all his HEP; attending water aerobics on days he's not coming to therapy.    Time 4    Period Weeks    Status Achieved    Target Date 08/03/20      PT SHORT TERM GOAL #2   Title Patient will increase RLE gross strength to 4+/5 as to improve functional strength for independent gait, increased standing tolerance and increased ADL ability.    Baseline 12/21: RLE gross strength 4/5 2/23: see note; 4/11: grossly 5/5 BLEs; 7/18: grossly 5/5 BLEs    Time 4    Period Weeks    Status Achieved    Target Date 08/03/20  PT Long Term Goals - 05/11/21 1342       PT LONG TERM GOAL #1   Title Patient will increase FOTO score to equal to or greater than 67 to demonstrate statistically significant improvement in mobility and quality of life.    Baseline 12/21: 57 2/23: 74%, 5/23: 55%, 6/15: 57%; 7/18: 70% 8/15: 66% (previously achieved); 9/13 69%, 10/26: 61%    Time 8    Period Weeks    Status On-going    Target Date 07/06/21      PT LONG TERM GOAL #2   Title Patient (> 17 years old) will complete five times sit to stand test in <12 seconds indicating an increased LE strength and improved balance.    Baseline 12/21: 17.65s, 01/26: 14.47s; 3/16: 13.17sec; 4/11: 8.5 sec, 5/23: 15.25 sec, 6/15: 8.25 sec with arms across chest; 9/12: 11.36 sec    Time 8    Period Weeks    Status Achieved    Target Date 07/06/21      PT LONG TERM GOAL #3   Title Patient will increase six minute walk test distance to >1000 for progression to community ambulator and improve  gait ability    Baseline 12/21: 500, 01/26: 655 ft 2/23: 735 ft; 3/16: 667f; 4/11: 737 ft, 6/15: 830 feet; 7/18: 810 ft 8/15: 865 ft 9/12: 870 ft, 10/26: 610 feet;    Time 8    Period Weeks    Status On-going    Target Date 07/06/21      PT LONG TERM GOAL #4   Title Patient will reduce timed up and go to <11 seconds to reduce fall risk and demonstrate improved transfer/gait ability.    Baseline 12/21: 16.78s, 01/26: 12.08s 2/23: 10 sec, 5/23: 10 sec, 6/15: 8.8 sec    Time 8    Period Weeks    Status Achieved    Target Date 07/06/21      PT LONG TERM GOAL #5   Title Patient will increase lower extremity functional scale to >60/80 to demonstrate improved functional mobility and increased tolerance with ADLs.    Baseline 12/21: 50/80, 01/26: 62/80, 6/15:61/80    Time 8    Period Weeks    Status Achieved    Target Date 07/06/21      PT LONG TERM GOAL #6   Title Patient will increase Berg Balance score by > 6 points to demonstrate decreased fall risk during functional activities.    Baseline 9/12: 47/56, 10/26: 49/56    Time 4    Period Weeks    Status Partially Met    Target Date 07/06/21                   Plan - 06/06/21 1539     Clinical Impression Statement Pt exhibits improvements with balance when performing foot clearance/hurdle intervention. Pt was able to perform this intervention without UE support and cleared hurdle 100% the time, indicating improved BLE strength necessary to clear obstacle and improved balance. While pt shows progress, he was most challenged with increasing gait speed and improving gait mechanics this session (particularly increasing step-length). The pt will benefit from further skilled pt to continue to improve strength, balance, endurance and gait.    Personal Factors and Comorbidities Comorbidity 3+;Time since onset of injury/illness/exacerbation    Comorbidities diabetes, hypertension, hyperlipidemia    Examination-Activity Limitations  Squat;Stairs;Transfers    Examination-Participation Restrictions [place]    Stability/Clinical Decision Making Evolving/Moderate complexity    Rehab Potential  Fair    PT Frequency 2x / week    PT Duration 8 weeks    PT Treatment/Interventions Moist Heat;Electrical Stimulation;Gait training;Stair training;Functional mobility training;Therapeutic activities;Therapeutic exercise;Balance training;Neuromuscular re-education;Patient/family education;Manual techniques;Passive range of motion;Energy conservation    PT Next Visit Plan obstacle course, coordination, dual task, endurance, continue POC as previously indicated    PT Home Exercise Plan no updates    Consulted and Agree with Plan of Care Patient             Patient will benefit from skilled therapeutic intervention in order to improve the following deficits and impairments:  Abnormal gait, Decreased endurance, Impaired sensation, Decreased activity tolerance, Decreased strength, Decreased balance, Decreased mobility, Difficulty walking, Decreased range of motion, Decreased safety awareness, Improper body mechanics  Visit Diagnosis: Other abnormalities of gait and mobility  Muscle weakness (generalized)  Unsteadiness on feet  Other lack of coordination     Problem List There are no problems to display for this patient.   Zollie Pee, PT 06/06/2021, 3:44 PM  [place] MAIN [facility] SERVICES [address] [place], [place], [address] Phone: [phone]   Fax:  [phone]  Name: Shedrick Sarli MRN: [id number] Date of Birth: [birth year]

## 2021-06-08 ENCOUNTER — Other Ambulatory Visit: Payer: Self-pay

## 2021-06-08 ENCOUNTER — Ambulatory Visit: Payer: Medicare Other

## 2021-06-08 DIAGNOSIS — M6281 Muscle weakness (generalized): Secondary | ICD-10-CM | POA: Diagnosis not present

## 2021-06-08 DIAGNOSIS — R2689 Other abnormalities of gait and mobility: Secondary | ICD-10-CM

## 2021-06-08 DIAGNOSIS — R2681 Unsteadiness on feet: Secondary | ICD-10-CM

## 2021-06-08 NOTE — Therapy (Signed)
[place] MAIN [facility] SERVICES [address] [place], [place], [address] Phone: [phone]   Fax:  [phone]  Physical Therapy Treatment  Patient Details  Name: Michael Cohen MRN: [id number] Date of Birth: Oct 03, 1940 Referring Provider (PT): [name]   Encounter Date: 06/08/2021   PT End of Session - 06/08/21 1824     Visit Number 75    Number of Visits 81    Date for PT Re-Evaluation 07/06/21    Authorization Type Medicare Parts A & B    Authorization Time Period 05/11/21-07/06/21    PT Start Time 1347    PT Stop Time 1428    PT Time Calculation (min) 41 min    Equipment Utilized During Treatment Gait belt    Activity Tolerance Patient tolerated treatment well    Behavior During Therapy [facility] for tasks assessed/performed             Past Medical History:  Diagnosis Date   Diabetes mellitus without complication ([place])    Hypercholesteremia    Hypertension     Past Surgical History:  Procedure Laterality Date   CATARACT EXTRACTION Bilateral    COLONOSCOPY WITH PROPOFOL N/A 12/12/2017   Procedure: COLONOSCOPY WITH PROPOFOL;  Surgeon: [name], [name], MD;  Location: [facility] ENDOSCOPY;  Service: Gastroenterology;  Laterality: N/A;   EYE SURGERY      There were no vitals filed for this visit.   Subjective Assessment - 06/08/21 1346     Subjective Pt reports no major changes since previous session. Pt has visitors in town for Thanksgiving.    Pertinent History 80 y.o. 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 His pain began around a month ago and is located over the proximal lateral thigh and in the right buttocks. He describes his pain as achy more than sharp. He is not sure of any aggravating activities. He reports of having some pain at rest today. He reports associated pain or "tightness" felt along anterior and lateral portions of the lower leg. He was  previously evaluated by [name] in podiatry and diagnosed with tibialis tendinitis. He denies associated groin pain, denies radicular symptoms. He has tried Baclofen without any relief of his symptoms. Of note, the patient states that he was scheduled to go on a cruise this week but was canceled due to the pain he is experiencing. He states that he would be unable to walk around and participate fully in his vacation given his pain. An anteroposterior view of the pelvis and anteroposterior and lateral views of the right hip were obtained. Images reveal mild loss of femoral acetabular joint space with mild osteophyte formation noted. CAM deformity of the right hip noted. No fractures or dislocations. Anteroposterior, oblique, and lateral views of the lumbar spine were obtained. Images reveal no scoliosis. Mild loss of lumbar lordosis noted. Significant loss of disc space noted at L4-L5 and L5-S1. Intervertebral osteophyte formation of L4 and L5. No fractures or wedging noted. Calcification of the abdominal aorta noted. Degeneration of the lumbar facets noted. Patient was recently diagnosed with lumbar radiculoapthy due to symptoms being more consistent vs hip pathology given the absence of hip symptoms with provocative maneuvers. Prednisone tapers have been considered but is on hold for now due to patient's diabetes.    Limitations Walking;Standing;Lifting;Sitting    How long can you sit comfortably? Few hours    How long can you stand comfortably? [place]  mins    How long can you walk comfortably? 1 hour    Currently in Pain? No/denies    Pain Onset More than a month ago    Pain Onset In the past 7 days              INTERVENTIONS - CGA provided for all the following interventions unless otherwise indicated   Treadmill training for cardiorespiratory and muscular endurance -patient ambulates up to 2.3 mph and achieves a max heart rate of 91 bpm x 7 minutes. Pt continues to exhibit and reports fatigue  with intervention. CGA provided throughout for safety, PT also continues to provide cuing for proximity to handrails, upright posture, increased step length.    Lateral stepping in // bars with 3# ankle weights donned each LE- 8x length of bars - VC and demo for improved step-length  3# ankle weights donned, pt performs standing hamstring curls 2x15 each LE  Standing marches with 3# ankle weights x multiple reps   STS with orange 5000 gr ball 2x10; pt rates as medium, notably fatigued  Obstacle course with pt wearing 3# ankle weights: stepping over half-foam, and onto and off of airex pad and tapping two cones to promote SLB. Pt progresses from BUE support to UUE support to perform obstacle course. Pt performs x several minutes. Pt exhibits decreased postural stability with cone tap task, relying increasingly on BUEs to maintain balance.  Standing on AIREX: NBOS EO 30 sec; no LOB/decrease in postural stability NBOS EC - 3x30 sec; challenging NBOS with vertical and horizontal head turns 2x10 for each in each direction. Pt exhibits decreased postural stability, requires UE support on // bars throughout to regain balance.      Pt educated throughout session about proper posture and technique with exercises. Improved exercise technique, movement at target joints, use of target muscles after min to mod verbal, visual, tactile cues.        PT Education - 06/08/21 1824     Education Details exercise technique, body mechanics    Person(s) Educated Patient    Methods Explanation;Demonstration;Verbal cues    Comprehension Verbalized understanding;Returned demonstration;Need further instruction              PT Short Term Goals - 05/11/21 1342       PT SHORT TERM GOAL #1   Title Patient will be independent in home exercise program to improve strength/mobility for better functional independence with ADLs.    Baseline patient is compliant with all his HEP; attending water aerobics on  days he's not coming to therapy.    Time 4    Period Weeks    Status Achieved    Target Date 08/03/20      PT SHORT TERM GOAL #2   Title Patient will increase RLE gross strength to 4+/5 as to improve functional strength for independent gait, increased standing tolerance and increased ADL ability.    Baseline 12/21: RLE gross strength 4/5 2/23: see note; 4/11: grossly 5/5 BLEs; 7/18: grossly 5/5 BLEs    Time 4    Period Weeks    Status Achieved    Target Date 08/03/20               PT Long Term Goals - 05/11/21 1342       PT LONG TERM GOAL #1   Title Patient will increase FOTO score to equal to or greater than 67 to demonstrate statistically significant improvement in mobility and quality of life.  Baseline 12/21: 57 2/23: 74%, 5/23: 55%, 6/15: 57%; 7/18: 70% 8/15: 66% (previously achieved); 9/13 69%, 10/26: 61%    Time 8    Period Weeks    Status On-going    Target Date 07/06/21      PT LONG TERM GOAL #2   Title Patient (> 35 years old) will complete five times sit to stand test in <12 seconds indicating an increased LE strength and improved balance.    Baseline 12/21: 17.65s, 01/26: 14.47s; 3/16: 13.17sec; 4/11: 8.5 sec, 5/23: 15.25 sec, 6/15: 8.25 sec with arms across chest; 9/12: 11.36 sec    Time 8    Period Weeks    Status Achieved    Target Date 07/06/21      PT LONG TERM GOAL #3   Title Patient will increase six minute walk test distance to >1000 for progression to community ambulator and improve gait ability    Baseline 12/21: 500, 01/26: 655 ft 2/23: 735 ft; 3/16: 625f; 4/11: 737 ft, 6/15: 830 feet; 7/18: 810 ft 8/15: 865 ft 9/12: 870 ft, 10/26: 610 feet;    Time 8    Period Weeks    Status On-going    Target Date 07/06/21      PT LONG TERM GOAL #4   Title Patient will reduce timed up and go to <11 seconds to reduce fall risk and demonstrate improved transfer/gait ability.    Baseline 12/21: 16.78s, 01/26: 12.08s 2/23: 10 sec, 5/23: 10 sec, 6/15: 8.8 sec     Time 8    Period Weeks    Status Achieved    Target Date 07/06/21      PT LONG TERM GOAL #5   Title Patient will increase lower extremity functional scale to >60/80 to demonstrate improved functional mobility and increased tolerance with ADLs.    Baseline 12/21: 50/80, 01/26: 62/80, 6/15:61/80    Time 8    Period Weeks    Status Achieved    Target Date 07/06/21      PT LONG TERM GOAL #6   Title Patient will increase Berg Balance score by > 6 points to demonstrate decreased fall risk during functional activities.    Baseline 9/12: 47/56, 10/26: 49/56    Time 4    Period Weeks    Status Partially Met    Target Date 07/06/21                   Plan - 06/08/21 1825     Clinical Impression Statement Pt making gains in PT, AEB ability to increase speed for LE muscular and cardiorespiratory training on treadmill today. Pt most challenged with balance tasks, such as cone tapping to encourage SLB and utilizing head turns on a compliant surface. The pt will benefit from further skilled PT to improve LE strength, balance, endurance and gait to increase ease and safety with ADLs.    Personal Factors and Comorbidities Comorbidity 3+;Time since onset of injury/illness/exacerbation    Comorbidities diabetes, hypertension, hyperlipidemia    Examination-Activity Limitations Squat;Stairs;Transfers    Examination-Participation Restrictions Church    Stability/Clinical Decision Making Evolving/Moderate complexity    Rehab Potential Fair    PT Frequency 2x / week    PT Duration 8 weeks    PT Treatment/Interventions Moist Heat;Electrical Stimulation;Gait training;Stair training;Functional mobility training;Therapeutic activities;Therapeutic exercise;Balance training;Neuromuscular re-education;Patient/family education;Manual techniques;Passive range of motion;Energy conservation    PT Next Visit Plan obstacle course, coordination, dual task, endurance, continue POC as previously indicated  PT Home Exercise Plan no updates    Consulted and Agree with Plan of Care Patient             Patient will benefit from skilled therapeutic intervention in order to improve the following deficits and impairments:  Abnormal gait, Decreased endurance, Impaired sensation, Decreased activity tolerance, Decreased strength, Decreased balance, Decreased mobility, Difficulty walking, Decreased range of motion, Decreased safety awareness, Improper body mechanics  Visit Diagnosis: Muscle weakness (generalized)  Unsteadiness on feet  Other abnormalities of gait and mobility     Problem List There are no problems to display for this patient.   Zollie Pee, PT 06/08/2021, 6:31 PM  [place] MAIN [facility] SERVICES [address] [place], [place], [address] Phone: [phone]   Fax:  [phone]  Name: Michael Cohen MRN: [id number] Date of Birth: Nov 14, 1940

## 2021-06-13 ENCOUNTER — Ambulatory Visit: Payer: Medicare Other

## 2021-06-15 ENCOUNTER — Other Ambulatory Visit: Payer: Self-pay

## 2021-06-15 ENCOUNTER — Ambulatory Visit: Payer: Medicare Other

## 2021-06-15 DIAGNOSIS — R2681 Unsteadiness on feet: Secondary | ICD-10-CM

## 2021-06-15 DIAGNOSIS — M6281 Muscle weakness (generalized): Secondary | ICD-10-CM | POA: Diagnosis not present

## 2021-06-15 DIAGNOSIS — R2689 Other abnormalities of gait and mobility: Secondary | ICD-10-CM

## 2021-06-15 DIAGNOSIS — M25551 Pain in right hip: Secondary | ICD-10-CM

## 2021-06-15 NOTE — Therapy (Signed)
[facility] [address] [place], [place], [address] Phone: [phone]   Fax:  [phone]  Physical Therapy Treatment/Physical Therapy Progress Note   Dates of reporting period  04/06/2021   to   06/15/2021   Patient Details  Name: Michael Cohen MRN: [id number] Date of Birth: 02/10/1941 Referring Provider (PT): Dr. Fowler   Encounter Date: 06/15/2021   PT End of Session - 06/15/21 1544     Visit Number 70    Number of Visits 81    Date for PT Re-Evaluation 07/06/21    Authorization Type Medicare Parts A & B    Authorization Time Period 05/11/21-07/06/21    PT Start Time 1348    PT Stop Time 1428    PT Time Calculation (min) 40 min    Equipment Utilized During Treatment Gait belt    Activity Tolerance Patient tolerated treatment well    Behavior During Therapy WFL for tasks assessed/performed             Past Medical History:  Diagnosis Date   Diabetes mellitus without complication (HCC)    Hypercholesteremia    Hypertension     Past Surgical History:  Procedure Laterality Date   CATARACT EXTRACTION Bilateral    COLONOSCOPY WITH PROPOFOL N/A 12/12/2017   Procedure: COLONOSCOPY WITH PROPOFOL;  Surgeon: [name], Teodoro K, MD;  Location: [facility] ENDOSCOPY;  Service: Gastroenterology;  Laterality: N/A;   EYE SURGERY      There were no vitals filed for this visit.   Subjective Assessment - 06/15/21 1350     Subjective Pt reports no pain currently. Pt reports no falls/LOB. No major changes since last session.    Pertinent History 79 y.o. 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 His pain began around a month ago and is located over the proximal lateral thigh and in the right buttocks. He describes his pain as achy more than sharp. He is not sure of any aggravating activities. He reports of having some pain at rest today. He reports associated  pain or "tightness" felt along anterior and lateral portions of the lower leg. He was previously evaluated by Dr. Fowler in podiatry and diagnosed with tibialis tendinitis. He denies associated groin pain, denies radicular symptoms. He has tried Baclofen without any relief of his symptoms. Of note, the patient states that he was scheduled to go on a cruise this week but was canceled due to the pain he is experiencing. He states that he would be unable to walk around and participate fully in his vacation given his pain. An anteroposterior view of the pelvis and anteroposterior and lateral views of the right hip were obtained. Images reveal mild loss of femoral acetabular joint space with mild osteophyte formation noted. CAM deformity of the right hip noted. No fractures or dislocations. Anteroposterior, oblique, and lateral views of the lumbar spine were obtained. Images reveal no scoliosis. Mild loss of lumbar lordosis noted. Significant loss of disc space noted at L4-L5 and L5-S1. Intervertebral osteophyte formation of L4 and L5. No fractures or wedging noted. Calcification of the abdominal aorta noted. Degeneration of the lumbar facets noted. Patient was recently diagnosed with lumbar radiculoapthy due to symptoms being more consistent vs hip pathology given the absence of hip symptoms with provocative maneuvers. Prednisone tapers have been considered but is on hold for now due to patient's diabetes.    Limitations Walking;Standing;Lifting;Sitting      How long can you sit comfortably? Few hours    How long can you stand comfortably? 30 mins    How long can you walk comfortably? 1 hour    Currently in Pain? No/denies    Pain Onset More than a month ago    Pain Onset In the past 7 days                [facility] PT Assessment - 06/15/21 0001       Berg Balance Test   Sit to Stand Able to stand without using hands and stabilize independently    Standing Unsupported Able to stand safely 2 minutes     Sitting with Back Unsupported but Feet Supported on Floor or Stool Able to sit safely and securely 2 minutes    Stand to Sit Sits safely with minimal use of hands    Transfers Able to transfer safely, minor use of hands    Standing Unsupported with Eyes Closed Able to stand 10 seconds safely    Standing Unsupported with Feet Together Able to place feet together independently and stand 1 minute safely    From Standing, Reach Forward with Outstretched Arm Can reach confidently >25 cm (10")    From Standing Position, Pick up Object from Floor Able to pick up shoe safely and easily    From Standing Position, Turn to Look Behind Over each Shoulder Looks behind from both sides and weight shifts well    Turn 360 Degrees Able to turn 360 degrees safely but slowly    Standing Unsupported, Alternately Place Feet on Step/Stool Able to stand independently and safely and complete 8 steps in 20 seconds    Standing Unsupported, One Foot in Front Able to take small step independently and hold 30 seconds    Standing on One Leg Tries to lift leg/unable to hold 3 seconds but remains standing independently    Total Score 49              INTERVENTIONS - see goal section for details FOTO 59% (slight decrease, previously 61%) 6MWT: 655 ft, reports onset of L hip pain around 4 min mark that reached 5/10 (pt wearing sneakers today instead of usual sandals to perform test), pt with no pain with a few minutes of seated rest following testing.  Berg 49/56 (unchanged)  SLB at support surface, UUE support, CGA, 2x30 sec each LE Tandem stance at support surface, UUE support, CGA, 2x30 sec each LE  Discussion and review of HEP, including safe technique, such as performing at support surface like a counter top with a chair behind him. Access Code: [email or web address] URL: [email or web address] Date: 06/15/2021 Prepared by: Ricard Dillon  Exercises Standing Single Leg Stance with Counter Support - 1 x daily - 7 x  weekly - 1 sets - 2 reps - 30 hold Standing Tandem Balance with Counter Support - 1 x daily - 7 x weekly - 1 sets - 2 reps - 30 hold     PT Education - 06/15/21 1543     Education Details reassessment findings, indications for PT/POC    Person(s) Educated Patient    Methods Explanation    Comprehension Verbalized understanding              PT Short Term Goals - 06/15/21 1545       PT SHORT TERM GOAL #1   Title Patient will be independent in home exercise program to improve strength/mobility for better functional independence with ADLs.  Baseline patient is compliant with all his HEP; attending water aerobics on days he's not coming to therapy.    Time 4    Period Weeks    Status Achieved    Target Date 08/03/20      PT SHORT TERM GOAL #2   Title Patient will increase RLE gross strength to 4+/5 as to improve functional strength for independent gait, increased standing tolerance and increased ADL ability.    Baseline 12/21: RLE gross strength 4/5 2/23: see note; 4/11: grossly 5/5 BLEs; 7/18: grossly 5/5 BLEs    Time 4    Period Weeks    Status Achieved    Target Date 08/03/20               PT Long Term Goals - 06/15/21 1353       PT LONG TERM GOAL #1   Title Patient will increase FOTO score to equal to or greater than 67 to demonstrate statistically significant improvement in mobility and quality of life.    Baseline 12/21: 57 2/23: 74%, 5/23: 55%, 6/15: 57%; 7/18: 70% 8/15: 66% (previously achieved); 9/13 69%, 10/26: 61%; 11/30: 59    Time 8    Period Weeks    Status On-going    Target Date 07/06/21      PT LONG TERM GOAL #2   Title Patient (> 106 years old) will complete five times sit to stand test in <12 seconds indicating an increased LE strength and improved balance.    Baseline 12/21: 17.65s, 01/26: 14.47s; 3/16: 13.17sec; 4/11: 8.5 sec, 5/23: 15.25 sec, 6/15: 8.25 sec with arms across chest; 9/12: 11.36 sec    Time 8    Period Weeks    Status  Achieved      PT LONG TERM GOAL #3   Title Patient will increase six minute walk test distance to >1000 for progression to community ambulator and improve gait ability    Baseline 12/21: 500, 01/26: 655 ft 2/23: 735 ft; 3/16: 648f; 4/11: 737 ft, 6/15: 830 feet; 7/18: 810 ft 8/15: 865 ft 9/12: 870 ft, 10/26: 610 feet; 11/30: 655 ft pt with onset of L hip pain to 5/10    Time 8    Period Weeks    Status On-going    Target Date 07/06/21      PT LONG TERM GOAL #4   Title Patient will reduce timed up and go to <11 seconds to reduce fall risk and demonstrate improved transfer/gait ability.    Baseline 12/21: 16.78s, 01/26: 12.08s 2/23: 10 sec, 5/23: 10 sec, 6/15: 8.8 sec    Time 8    Period Weeks    Status Achieved      PT LONG TERM GOAL #5   Title Patient will increase lower extremity functional scale to >60/80 to demonstrate improved functional mobility and increased tolerance with ADLs.    Baseline 12/21: 50/80, 01/26: 62/80, 6/15:61/80    Time 8    Period Weeks    Status Achieved    Target Date 07/06/21      PT LONG TERM GOAL #6   Title Patient will increase Berg Balance score by > 6 points to demonstrate decreased fall risk during functional activities.    Baseline 9/12: 47/56, 10/26: 49/56; 11/30: 49/56    Time 4    Period Weeks    Status Partially Met    Target Date 07/06/21  Plan - 06/15/21 1549     Clinical Impression Statement Goals reassessed for progress note. Pt improved on 6MWT ambulating up to 655 ft on this date, however, he did experience some L hip pain that reached a 5/10, but completey resolved with rest.pt reports this is the only time this has happened recently. While pt made some progress, pt did see a slight decrease in FOTO score today from 61% to 59%, and BERG score remained unchanged from previous assessment (49/56). PT discussed results with pt and indications for POC as pt with plateau of progress overall as indicated by objective  measures. PT provided pt with HEP that focuses on improving balance (see note for details) Pt to decrease frequency to 1x a week with discussion of returning to his water aerobics class and focusing on performing new and previous HEPs at home in lieu of coming in 2x/week. Patient's condition has the potential to improve in response to therapy. Maximum improvement is yet to be obtained. The anticipated improvement is attainable and reasonable in a generally predictable time. The pt will benefit from further skilled PT to improve LE strength, endurance, balance and gait in order to increase ease and safety wtih ADLs.    Personal Factors and Comorbidities Comorbidity 3+;Time since onset of injury/illness/exacerbation    Comorbidities diabetes, hypertension, hyperlipidemia    Examination-Activity Limitations Squat;Stairs;Transfers    Examination-Participation Restrictions Church    Stability/Clinical Decision Making Evolving/Moderate complexity    Rehab Potential Fair    PT Frequency 2x / week    PT Duration 8 weeks    PT Treatment/Interventions Moist Heat;Electrical Stimulation;Gait training;Stair training;Functional mobility training;Therapeutic activities;Therapeutic exercise;Balance training;Neuromuscular re-education;Patient/family education;Manual techniques;Passive range of motion;Energy conservation    PT Next Visit Plan obstacle course, coordination, dual task, endurance, continue POC as previously indicated    PT Home Exercise Plan 11/30: Access Code: [email or web address]    Consulted and Agree with Plan of Care Patient             Patient will benefit from skilled therapeutic intervention in order to improve the following deficits and impairments:  Abnormal gait, Decreased endurance, Impaired sensation, Decreased activity tolerance, Decreased strength, Decreased balance, Decreased mobility, Difficulty walking, Decreased range of motion, Decreased safety awareness, Improper body mechanics  Visit  Diagnosis: Other abnormalities of gait and mobility  Pain in right hip  Unsteadiness on feet  Muscle weakness (generalized)     Problem List There are no problems to display for this patient.    R , PT 06/15/2021, 4:04 PM  [facility] [address] [place], [place], [address] Phone: [phone]   Fax:  [phone]  Name: Michael Cohen MRN: [id number] Date of Birth: 10/12/1940    

## 2021-06-20 ENCOUNTER — Other Ambulatory Visit: Payer: Self-pay

## 2021-06-20 ENCOUNTER — Ambulatory Visit: Payer: Medicare Other | Attending: Podiatry

## 2021-06-20 DIAGNOSIS — R2689 Other abnormalities of gait and mobility: Secondary | ICD-10-CM | POA: Diagnosis present

## 2021-06-20 DIAGNOSIS — M6281 Muscle weakness (generalized): Secondary | ICD-10-CM | POA: Diagnosis not present

## 2021-06-20 DIAGNOSIS — R2681 Unsteadiness on feet: Secondary | ICD-10-CM | POA: Insufficient documentation

## 2021-06-20 NOTE — Therapy (Signed)
[place] [facility] [address] [place], [place], [address] Phone: [phone]   Fax:  [phone]  Physical Therapy Treatment  Patient Details  Name: Michael Cohen MRN: [id number] Date of Birth: 1941-04-04 Referring Provider (PT): [name]   Encounter Date: 06/20/2021   PT End of Session - 06/20/21 1443     Visit Number 53    Number of Visits 81    Date for PT Re-Evaluation 07/06/21    Authorization Type Medicare Parts A & B    Authorization Time Period 05/11/21-07/06/21    PT Start Time 1347    PT Stop Time 1429    PT Time Calculation (min) 42 min    Equipment Utilized During Treatment Gait belt    Activity Tolerance Patient tolerated treatment well    Behavior During Therapy [facility] for tasks assessed/performed             Past Medical History:  Diagnosis Date   Diabetes mellitus without complication ([place])    Hypercholesteremia    Hypertension     Past Surgical History:  Procedure Laterality Date   CATARACT EXTRACTION Bilateral    COLONOSCOPY WITH PROPOFOL N/A 12/12/2017   Procedure: COLONOSCOPY WITH PROPOFOL;  Surgeon: [name], [name], MD;  Location: [facility] ENDOSCOPY;  Service: Gastroenterology;  Laterality: N/A;   EYE SURGERY      There were no vitals filed for this visit.   Subjective Assessment - 06/20/21 1442     Subjective Pt reports no major changes since previous session, no pain or stumbles.    Pertinent History 80 y.o. 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 His pain began around a month ago and is located over the proximal lateral thigh and in the right buttocks. He describes his pain as achy more than sharp. He is not sure of any aggravating activities. He reports of having some pain at rest today. He reports associated pain or "tightness" felt along anterior and lateral portions of the lower leg. He was previously evaluated by Dr.  [name] in podiatry and diagnosed with tibialis tendinitis. He denies associated groin pain, denies radicular symptoms. He has tried Baclofen without any relief of his symptoms. Of note, the patient states that he was scheduled to go on a cruise this week but was canceled due to the pain he is experiencing. He states that he would be unable to walk around and participate fully in his vacation given his pain. An anteroposterior view of the pelvis and anteroposterior and lateral views of the right hip were obtained. Images reveal mild loss of femoral acetabular joint space with mild osteophyte formation noted. CAM deformity of the right hip noted. No fractures or dislocations. Anteroposterior, oblique, and lateral views of the lumbar spine were obtained. Images reveal no scoliosis. Mild loss of lumbar lordosis noted. Significant loss of disc space noted at L4-L5 and L5-S1. Intervertebral osteophyte formation of L4 and L5. No fractures or wedging noted. Calcification of the abdominal aorta noted. Degeneration of the lumbar facets noted. Patient was recently diagnosed with lumbar radiculoapthy due to symptoms being more consistent vs hip pathology given the absence of hip symptoms with provocative maneuvers. Prednisone tapers have been considered but is on hold for now due to patient's diabetes.    Limitations Walking;Standing;Lifting;Sitting    How long can you sit comfortably? Few hours    How long can you stand comfortably? 30 mins  How long can you walk comfortably? 1 hour    Currently in Pain? No/denies    Pain Onset More than a month ago    Pain Onset In the past 7 days           INTERVENTIONS -   Treadmill training for cardiorespiratory and muscular endurance -patient ambulates up to 2.1 mph and achieves a max heart rate of 92 bpm x 8 minutes. CGA was provided throughout for safety, PT also continues to provide cuing for proximity to handrails, upright posture, minimal cuing for improved  step-length today. Close CGA for mount/dismount.   4# ankle weights donned each LE: Standing hip abduction 3x10 Standing hip extension 3x10 Comments: Pt rates as easy    At support surface with CGA and gait belt donned throughout unless otherwise noted: SLB at support surface, UUE support, CGA, 2x30 sec each LE, must use UUE support   Tandem stance at support surface, intermittent UE support, CGA, 2x30 sec each LE   Standing on airex NBOS EC 3x30-40 sec  Standing on airex with vertical head turns 1x10, 1x5 each direction  Standing on airex with horizontal head turns, 2x15 each direction  March on airex - x multiple reps, cuing for improved AROM, eccentric control. Attempts to perform without UE support and exhibits decreased ROM.  Semi tandem walk in // bars 6x, intermittent UE support    Pt educated throughout session about proper posture and technique with exercises. Improved exercise technique, movement at target joints, use of target muscles after min to mod verbal, visual, tactile cues.   PT Education - 06/20/21 1442     Education Details exercise technique, body mechanics (emphasis hip extension intervention)    Person(s) Educated Patient    Methods Explanation;Demonstration;Verbal cues    Comprehension Verbalized understanding;Returned demonstration;Need further instruction;Verbal cues required              PT Short Term Goals - 06/15/21 1545       PT SHORT TERM GOAL #1   Title Patient will be independent in home exercise program to improve strength/mobility for better functional independence with ADLs.    Baseline patient is compliant with all his HEP; attending water aerobics on days he's not coming to therapy.    Time 4    Period Weeks    Status Achieved    Target Date 08/03/20      PT SHORT TERM GOAL #2   Title Patient will increase RLE gross strength to 4+/5 as to improve functional strength for independent gait, increased standing tolerance and  increased ADL ability.    Baseline 12/21: RLE gross strength 4/5 2/23: see note; 4/11: grossly 5/5 BLEs; 7/18: grossly 5/5 BLEs    Time 4    Period Weeks    Status Achieved    Target Date 08/03/20               PT Long Term Goals - 06/15/21 1353       PT LONG TERM GOAL #1   Title Patient will increase FOTO score to equal to or greater than 67 to demonstrate statistically significant improvement in mobility and quality of life.    Baseline 12/21: 57 2/23: 74%, 5/23: 55%, 6/15: 57%; 7/18: 70% 8/15: 66% (previously achieved); 9/13 69%, 10/26: 61%; 11/30: 59    Time 8    Period Weeks    Status On-going    Target Date 07/06/21      PT LONG TERM GOAL #2  Title Patient (> 43 years old) will complete five times sit to stand test in <12 seconds indicating an increased LE strength and improved balance.    Baseline 12/21: 17.65s, 01/26: 14.47s; 3/16: 13.17sec; 4/11: 8.5 sec, 5/23: 15.25 sec, 6/15: 8.25 sec with arms across chest; 9/12: 11.36 sec    Time 8    Period Weeks    Status Achieved      PT LONG TERM GOAL #3   Title Patient will increase six minute walk test distance to >1000 for progression to community ambulator and improve gait ability    Baseline 12/21: 500, 01/26: 655 ft 2/23: 735 ft; 3/16: 67f; 4/11: 737 ft, 6/15: 830 feet; 7/18: 810 ft 8/15: 865 ft 9/12: 870 ft, 10/26: 610 feet; 11/30: 655 ft pt with onset of L hip pain to 5/10    Time 8    Period Weeks    Status On-going    Target Date 07/06/21      PT LONG TERM GOAL #4   Title Patient will reduce timed up and go to <11 seconds to reduce fall risk and demonstrate improved transfer/gait ability.    Baseline 12/21: 16.78s, 01/26: 12.08s 2/23: 10 sec, 5/23: 10 sec, 6/15: 8.8 sec    Time 8    Period Weeks    Status Achieved      PT LONG TERM GOAL #5   Title Patient will increase lower extremity functional scale to >60/80 to demonstrate improved functional mobility and increased tolerance with ADLs.    Baseline  12/21: 50/80, 01/26: 62/80, 6/15:61/80    Time 8    Period Weeks    Status Achieved    Target Date 07/06/21      PT LONG TERM GOAL #6   Title Patient will increase Berg Balance score by > 6 points to demonstrate decreased fall risk during functional activities.    Baseline 9/12: 47/56, 10/26: 49/56; 11/30: 49/56    Time 4    Period Weeks    Status Partially Met    Target Date 07/06/21                   Plan - 06/20/21 1444     Clinical Impression Statement Pt able to advance level of resistance used with standing LE strengthening and continued to rate interventions as "easy." This indicates improved LE strength and endurance, which will continue to be advanced next session. Pt most challenged with semi-tandem walk and SLB. The pt will benefit from further skilled PT to improve LE strength, endurance, balance and gait in order to increase ease and safety with ADLs.    Personal Factors and Comorbidities Comorbidity 3+;Time since onset of injury/illness/exacerbation    Comorbidities diabetes, hypertension, hyperlipidemia    Examination-Activity Limitations Squat;Stairs;Transfers    Examination-Participation Restrictions Church    Stability/Clinical Decision Making Evolving/Moderate complexity    Rehab Potential Fair    PT Frequency 2x / week    PT Duration 8 weeks    PT Treatment/Interventions Moist Heat;Electrical Stimulation;Gait training;Stair training;Functional mobility training;Therapeutic activities;Therapeutic exercise;Balance training;Neuromuscular re-education;Patient/family education;Manual techniques;Passive range of motion;Energy conservation    PT Next Visit Plan obstacle course, coordination, dual task, endurance, continue POC as previously indicated    PT Home Exercise Plan 11/30: Access Code: [email or web address]    [email or web address] Agree with Plan of Care Patient             Patient will benefit from skilled therapeutic intervention in order to improve the following  deficits and  impairments:  Abnormal gait, Decreased endurance, Impaired sensation, Decreased activity tolerance, Decreased strength, Decreased balance, Decreased mobility, Difficulty walking, Decreased range of motion, Decreased safety awareness, Improper body mechanics  Visit Diagnosis: Muscle weakness (generalized)  Unsteadiness on feet  Other abnormalities of gait and mobility     Problem List There are no problems to display for this patient.   Zollie Pee, PT 06/20/2021, 2:48 PM  [place] [facility] SERVICES [address] [place], [place], [address] Phone: [phone]   Fax:  [phone]  Name: Michael Cohen MRN: [id number] Date of Birth: 04-Feb-1941

## 2021-06-22 ENCOUNTER — Ambulatory Visit: Payer: Medicare Other

## 2021-06-27 ENCOUNTER — Other Ambulatory Visit: Payer: Self-pay

## 2021-06-27 ENCOUNTER — Ambulatory Visit: Payer: Medicare Other

## 2021-06-27 DIAGNOSIS — R2689 Other abnormalities of gait and mobility: Secondary | ICD-10-CM

## 2021-06-27 DIAGNOSIS — R2681 Unsteadiness on feet: Secondary | ICD-10-CM

## 2021-06-27 DIAGNOSIS — M6281 Muscle weakness (generalized): Secondary | ICD-10-CM | POA: Diagnosis not present

## 2021-06-27 NOTE — Therapy (Signed)
[place] MAIN [facility] SERVICES [address] [place], [place], [address] Phone: [phone]   Fax:  [phone]  Physical Therapy Treatment  Patient Details  Name: Michael Cohen MRN: [id number] Date of Birth: 01/26/1941 Referring Provider (PT): [name]   Encounter Date: 06/27/2021   PT End of Session - 06/27/21 1530     Visit Number 72    Number of Visits 81    Date for PT Re-Evaluation 07/06/21    Authorization Type Medicare Parts A & B    Authorization Time Period 05/11/21-07/06/21    PT Start Time 1348    PT Stop Time 1429    PT Time Calculation (min) 41 min    Equipment Utilized During Treatment Gait belt    Activity Tolerance Patient tolerated treatment well    Behavior During Therapy [facility] for tasks assessed/performed             Past Medical History:  Diagnosis Date   Diabetes mellitus without complication ([place])    Hypercholesteremia    Hypertension     Past Surgical History:  Procedure Laterality Date   CATARACT EXTRACTION Bilateral    COLONOSCOPY WITH PROPOFOL N/A 12/12/2017   Procedure: COLONOSCOPY WITH PROPOFOL;  Surgeon: [name], [name], MD;  Location: [facility] ENDOSCOPY;  Service: Gastroenterology;  Laterality: N/A;   EYE SURGERY      There were no vitals filed for this visit.   Subjective Assessment - 06/27/21 1533     Subjective Pt reports no pain currently. He does say occassionaly his hip as bothered him. Pt reports no falls.    Pertinent History 80 y.o. 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 His pain began around a month ago and is located over the proximal lateral thigh and in the right buttocks. He describes his pain as achy more than sharp. He is not sure of any aggravating activities. He reports of having some pain at rest today. He reports associated pain or "tightness" felt along anterior and lateral portions of the lower leg. He was  previously evaluated by [name] in podiatry and diagnosed with tibialis tendinitis. He denies associated groin pain, denies radicular symptoms. He has tried Baclofen without any relief of his symptoms. Of note, the patient states that he was scheduled to go on a cruise this week but was canceled due to the pain he is experiencing. He states that he would be unable to walk around and participate fully in his vacation given his pain. An anteroposterior view of the pelvis and anteroposterior and lateral views of the right hip were obtained. Images reveal mild loss of femoral acetabular joint space with mild osteophyte formation noted. CAM deformity of the right hip noted. No fractures or dislocations. Anteroposterior, oblique, and lateral views of the lumbar spine were obtained. Images reveal no scoliosis. Mild loss of lumbar lordosis noted. Significant loss of disc space noted at L4-L5 and L5-S1. Intervertebral osteophyte formation of L4 and L5. No fractures or wedging noted. Calcification of the abdominal aorta noted. Degeneration of the lumbar facets noted. Patient was recently diagnosed with lumbar radiculoapthy due to symptoms being more consistent vs hip pathology given the absence of hip symptoms with provocative maneuvers. Prednisone tapers have been considered but is on hold for now due to patient's diabetes.    Limitations Walking;Standing;Lifting;Sitting    How long can you sit comfortably? Few hours    How long can you  stand comfortably? 30 mins    How long can you walk comfortably? 1 hour    Currently in Pain? No/denies    Pain Onset More than a month ago    Pain Onset In the past 7 days            INTERVENTIONS -   Treadmill training for cardiorespiratory and muscular endurance -patient ambulates up to 2.1 mph and achieves a max heart rate of 91 bpm x 7.5 minutes. CGA was provided throughout for safety, PT also continues to provide cuing for proximity to handrails, upright posture,  cuing focused on improving stride length, particular focus on LLE step length and heel strike. Close CGA for mount/dismount.   Obstacle clearance - pt steps over two orange hurdles with decreasing levels of UE support. Pt performs multiple reps without difficulty. Intervention progressed (see below)  5# ankle weights donned to each LE: Obstacle clearance - pt steps over two orange hurdles with decreasing levels of UE support and alternating leading LE. Pt reports greater difficulty, but only knocks into hurdle 1x. Must use intermittent UE support. Pt performs multiple reps.   Standing hip abduction with 5# weights donned on eahc LE - 2x12 BLEs. Cuing for eccentric control and AROM.  At support surface with CGA standing on airex: NBOS 30 sec NBOS, horizontal head turns 10x NBOS vertical head turns 10x EC 60 sec - intermittent UE support Comments: cuing to maintain EC with lat intervention, and cuing to improve cervical rotation with horizontal head turns. PT also provides VC throughout for upright posture. Pt uses intermittent UE support.   Modified single leg balance progression - one foot on floor one foot on airex 30 sec each LE. No LOB  Modified single leg balance progression one foot on airex and one foot on 6" step 30 sec x 2 rounds Lateral stepping form 6" step>airex pad>6" step on other side with UUE support to intermittent UE support x multiple reps.      Pt educated throughout session about proper posture and technique with exercises. Improved exercise technique, movement at target joints, use of target muscles after min to mod verbal, visual, tactile cues.    PT Education - 06/27/21 1534     Education Details exercise technique, focus on hip abduction    Person(s) Educated Patient    Methods Explanation;Demonstration;Verbal cues    Comprehension Verbalized understanding;Returned demonstration;Verbal cues required;Need further instruction              PT Short Term Goals  - 06/15/21 1545       PT SHORT TERM GOAL #1   Title Patient will be independent in home exercise program to improve strength/mobility for better functional independence with ADLs.    Baseline patient is compliant with all his HEP; attending water aerobics on days he's not coming to therapy.    Time 4    Period Weeks    Status Achieved    Target Date 08/03/20      PT SHORT TERM GOAL #2   Title Patient will increase RLE gross strength to 4+/5 as to improve functional strength for independent gait, increased standing tolerance and increased ADL ability.    Baseline 12/21: RLE gross strength 4/5 2/23: see note; 4/11: grossly 5/5 BLEs; 7/18: grossly 5/5 BLEs    Time 4    Period Weeks    Status Achieved    Target Date 08/03/20  PT Long Term Goals - 06/15/21 1353       PT LONG TERM GOAL #1   Title Patient will increase FOTO score to equal to or greater than 67 to demonstrate statistically significant improvement in mobility and quality of life.    Baseline 12/21: 57 2/23: 74%, 5/23: 55%, 6/15: 57%; 7/18: 70% 8/15: 66% (previously achieved); 9/13 69%, 10/26: 61%; 11/30: 59    Time 8    Period Weeks    Status On-going    Target Date 07/06/21      PT LONG TERM GOAL #2   Title Patient (> 80 years old) will complete five times sit to stand test in <12 seconds indicating an increased LE strength and improved balance.    Baseline 12/21: 17.65s, 01/26: 14.47s; 3/16: 13.17sec; 4/11: 8.5 sec, 5/23: 15.25 sec, 6/15: 8.25 sec with arms across chest; 9/12: 11.36 sec    Time 8    Period Weeks    Status Achieved      PT LONG TERM GOAL #3   Title Patient will increase six minute walk test distance to >1000 for progression to community ambulator and improve gait ability    Baseline 12/21: 500, 01/26: 655 ft 2/23: 735 ft; 3/16: 640f; 4/11: 737 ft, 6/15: 830 feet; 7/18: 810 ft 8/15: 865 ft 9/12: 870 ft, 10/26: 610 feet; 11/30: 655 ft pt with onset of L hip pain to 5/10    Time 8     Period Weeks    Status On-going    Target Date 07/06/21      PT LONG TERM GOAL #4   Title Patient will reduce timed up and go to <11 seconds to reduce fall risk and demonstrate improved transfer/gait ability.    Baseline 12/21: 16.78s, 01/26: 12.08s 2/23: 10 sec, 5/23: 10 sec, 6/15: 8.8 sec    Time 8    Period Weeks    Status Achieved      PT LONG TERM GOAL #5   Title Patient will increase lower extremity functional scale to >60/80 to demonstrate improved functional mobility and increased tolerance with ADLs.    Baseline 12/21: 50/80, 01/26: 62/80, 6/15:61/80    Time 8    Period Weeks    Status Achieved    Target Date 07/06/21      PT LONG TERM GOAL #6   Title Patient will increase Berg Balance score by > 6 points to demonstrate decreased fall risk during functional activities.    Baseline 9/12: 47/56, 10/26: 49/56; 11/30: 49/56    Time 4    Period Weeks    Status Partially Met    Target Date 07/06/21                   Plan - 06/27/21 1528     Clinical Impression Statement Pt shows progress with performing obstacle clearance intervention with 5# weights donned and with decreasing levels of UE support. Pt only knocks over hurdle 1x and otherwise maintains balance. While pt shows progress, he continues to be challenged with use of head turns, and performing EC on compliant surface, indicating need to improve use of vestibular cues. The pt will benefit from further skilled PT to continue to improve LE strength, endurance, gait and balance.    Personal Factors and Comorbidities Comorbidity 3+;Time since onset of injury/illness/exacerbation    Comorbidities diabetes, hypertension, hyperlipidemia    Examination-Activity Limitations Squat;Stairs;Transfers    Examination-Participation Restrictions Church    Stability/Clinical Decision Making Evolving/Moderate complexity  Rehab Potential Fair    PT Frequency 2x / week    PT Duration 8 weeks    PT Treatment/Interventions  Moist Heat;Electrical Stimulation;Gait training;Stair training;Functional mobility training;Therapeutic activities;Therapeutic exercise;Balance training;Neuromuscular re-education;Patient/family education;Manual techniques;Passive range of motion;Energy conservation    PT Next Visit Plan obstacle course, coordination, dual task, endurance, continue POC as previously indicated    PT Home Exercise Plan 11/30: Access Code: [email or web address]    Consulted and Agree with Plan of Care Patient             Patient will benefit from skilled therapeutic intervention in order to improve the following deficits and impairments:  Abnormal gait, Decreased endurance, Impaired sensation, Decreased activity tolerance, Decreased strength, Decreased balance, Decreased mobility, Difficulty walking, Decreased range of motion, Decreased safety awareness, Improper body mechanics  Visit Diagnosis: Other abnormalities of gait and mobility  Muscle weakness (generalized)  Unsteadiness on feet     Problem List There are no problems to display for this patient.   Zollie Pee, PT 06/27/2021, 3:34 PM  [place] MAIN [facility] SERVICES [address] [place], [place], [address] Phone: [phone]   Fax:  [phone]  Name: Michael Cohen MRN: [id number] Date of Birth: May 07, 1941

## 2021-06-29 ENCOUNTER — Ambulatory Visit: Payer: Medicare Other

## 2021-07-04 ENCOUNTER — Ambulatory Visit: Payer: Medicare Other

## 2021-07-04 ENCOUNTER — Other Ambulatory Visit: Payer: Self-pay

## 2021-07-04 DIAGNOSIS — R2689 Other abnormalities of gait and mobility: Secondary | ICD-10-CM

## 2021-07-04 DIAGNOSIS — M6281 Muscle weakness (generalized): Secondary | ICD-10-CM | POA: Diagnosis not present

## 2021-07-04 DIAGNOSIS — R2681 Unsteadiness on feet: Secondary | ICD-10-CM

## 2021-07-04 NOTE — Therapy (Signed)
[place] MAIN [facility] SERVICES [address] [place], [place], [address] Phone: [phone]   Fax:  [phone]  Physical Therapy Treatment  Patient Details  Name: Michael Cohen MRN: [id number] Date of Birth: 08-15-1940 Referring Provider (PT): [name]   Encounter Date: 07/04/2021   PT End of Session - 07/04/21 1531     Visit Number 13    Number of Visits 81    Date for PT Re-Evaluation 07/06/21    Authorization Type Medicare Parts A & B    Authorization Time Period 05/11/21-07/06/21    PT Start Time 1348    PT Stop Time 1428    PT Time Calculation (min) 40 min    Equipment Utilized During Treatment Gait belt    Activity Tolerance Patient tolerated treatment well    Behavior During Therapy [facility] for tasks assessed/performed             Past Medical History:  Diagnosis Date   Diabetes mellitus without complication ([place])    Hypercholesteremia    Hypertension     Past Surgical History:  Procedure Laterality Date   CATARACT EXTRACTION Bilateral    COLONOSCOPY WITH PROPOFOL N/A 12/12/2017   Procedure: COLONOSCOPY WITH PROPOFOL;  Surgeon: [name], [name], MD;  Location: [facility] ENDOSCOPY;  Service: Gastroenterology;  Laterality: N/A;   EYE SURGERY      There were no vitals filed for this visit.   Subjective Assessment - 07/04/21 1530     Subjective Patient reports no major changes since previous session.  Patient reports no pain.    Pertinent History 80 y.o. 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 His pain began around a month ago and is located over the proximal lateral thigh and in the right buttocks. He describes his pain as achy more than sharp. He is not sure of any aggravating activities. He reports of having some pain at rest today. He reports associated pain or "tightness" felt along anterior and lateral portions of the lower leg. He was previously  evaluated by [name] in podiatry and diagnosed with tibialis tendinitis. He denies associated groin pain, denies radicular symptoms. He has tried Baclofen without any relief of his symptoms. Of note, the patient states that he was scheduled to go on a cruise this week but was canceled due to the pain he is experiencing. He states that he would be unable to walk around and participate fully in his vacation given his pain. An anteroposterior view of the pelvis and anteroposterior and lateral views of the right hip were obtained. Images reveal mild loss of femoral acetabular joint space with mild osteophyte formation noted. CAM deformity of the right hip noted. No fractures or dislocations. Anteroposterior, oblique, and lateral views of the lumbar spine were obtained. Images reveal no scoliosis. Mild loss of lumbar lordosis noted. Significant loss of disc space noted at L4-L5 and L5-S1. Intervertebral osteophyte formation of L4 and L5. No fractures or wedging noted. Calcification of the abdominal aorta noted. Degeneration of the lumbar facets noted. Patient was recently diagnosed with lumbar radiculoapthy due to symptoms being more consistent vs hip pathology given the absence of hip symptoms with provocative maneuvers. Prednisone tapers have been considered but is on hold for now due to patient's diabetes.    Limitations Walking;Standing;Lifting;Sitting    How long can you sit comfortably? Few hours    How long can you stand comfortably? 30 mins  How long can you walk comfortably? 1 hour    Currently in Pain? No/denies    Pain Onset More than a month ago    Pain Onset In the past 7 days              INTERVENTIONS -   At support surface with CGA standing on airex, gait belt donned: NBOS 30 sec EO EC 4x30 sec - intermittent UE support  Cone taps for SLB progression with decreasing levels of UE support: BUE support x multiple reps each LE UUE support x multiple reps each LE Attempt without  UE support and pt is unable Finger touch support on bar x multiple reps each LE   Modified single leg balance progression one foot on airex and one foot on 6" step 30-50 sec x 3 rounds, performed on each LE Pt rates as easy   Semi tandem 3x30-45 sec sec each LE Comments: pt rates as easy     Treadmill training for cardiorespiratory and muscular endurance -patient ambulates up to 2.29mh and achieves a max heart rate of 94 bpm x 7.5 minutes. CGA was provided throughout for safety, PT also continues to provide cuing for proximity to handrails, and stride length.. Close CGA for mount/dismount.    5# ankle weights donned to each LE: Standing hip flexion followed by hip ext. 12-14 each LE. PT provides VC/TC/Demo. Pt difficulty not using momentum to complete      Pt educated throughout session about proper posture and technique with exercises. Improved exercise technique, movement at target joints, use of target muscles after min to mod verbal, visual, tactile cues.  Note: Portions of this document were prepared using Dragon voice recognition software and although reviewed may contain unintentional dictation errors in syntax, grammar, or spelling.      PT Education - 07/04/21 1531     Education Details Exercise technique, body mechanics    Person(s) Educated Patient    Methods Explanation;Demonstration;Verbal cues    Comprehension Verbalized understanding;Returned demonstration;Need further instruction              PT Short Term Goals - 06/15/21 1545       PT SHORT TERM GOAL #1   Title Patient will be independent in home exercise program to improve strength/mobility for better functional independence with ADLs.    Baseline patient is compliant with all his HEP; attending water aerobics on days he's not coming to therapy.    Time 4    Period Weeks    Status Achieved    Target Date 08/03/20      PT SHORT TERM GOAL #2   Title Patient will increase RLE gross strength to 4+/5 as to  improve functional strength for independent gait, increased standing tolerance and increased ADL ability.    Baseline 12/21: RLE gross strength 4/5 2/23: see note; 4/11: grossly 5/5 BLEs; 7/18: grossly 5/5 BLEs    Time 4    Period Weeks    Status Achieved    Target Date 08/03/20               PT Long Term Goals - 06/15/21 1353       PT LONG TERM GOAL #1   Title Patient will increase FOTO score to equal to or greater than 67 to demonstrate statistically significant improvement in mobility and quality of life.    Baseline 12/21: 57 2/23: 74%, 5/23: 55%, 6/15: 57%; 7/18: 70% 8/15: 66% (previously achieved); 9/13 69%, 10/26: 61%; 11/30: 59  Time 8    Period Weeks    Status On-going    Target Date 07/06/21      PT LONG TERM GOAL #2   Title Patient (> 47 years old) will complete five times sit to stand test in <12 seconds indicating an increased LE strength and improved balance.    Baseline 12/21: 17.65s, 01/26: 14.47s; 3/16: 13.17sec; 4/11: 8.5 sec, 5/23: 15.25 sec, 6/15: 8.25 sec with arms across chest; 9/12: 11.36 sec    Time 8    Period Weeks    Status Achieved      PT LONG TERM GOAL #3   Title Patient will increase six minute walk test distance to >1000 for progression to community ambulator and improve gait ability    Baseline 12/21: 500, 01/26: 655 ft 2/23: 735 ft; 3/16: 656f; 4/11: 737 ft, 6/15: 830 feet; 7/18: 810 ft 8/15: 865 ft 9/12: 870 ft, 10/26: 610 feet; 11/30: 655 ft pt with onset of L hip pain to 5/10    Time 8    Period Weeks    Status On-going    Target Date 07/06/21      PT LONG TERM GOAL #4   Title Patient will reduce timed up and go to <11 seconds to reduce fall risk and demonstrate improved transfer/gait ability.    Baseline 12/21: 16.78s, 01/26: 12.08s 2/23: 10 sec, 5/23: 10 sec, 6/15: 8.8 sec    Time 8    Period Weeks    Status Achieved      PT LONG TERM GOAL #5   Title Patient will increase lower extremity functional scale to >60/80 to  demonstrate improved functional mobility and increased tolerance with ADLs.    Baseline 12/21: 50/80, 01/26: 62/80, 6/15:61/80    Time 8    Period Weeks    Status Achieved    Target Date 07/06/21      PT LONG TERM GOAL #6   Title Patient will increase Berg Balance score by > 6 points to demonstrate decreased fall risk during functional activities.    Baseline 9/12: 47/56, 10/26: 49/56; 11/30: 49/56    Time 4    Period Weeks    Status Partially Met    Target Date 07/06/21                   Plan - 07/04/21 1540     Clinical Impression Statement Patient able to progress to 2.2 mph on treadmill, indicating improved endurance.  Patient still very challenged with single-leg balance activities, such as modified single-leg progression with 1 foot on Airex and 1 foot on step and with cone taps.  Patient not yet able to perform these interventions without some upper extremity support.  The patient will continue to benefit from further skilled PT to improve lower extremity strength, endurance, gait and balance to increase ease and safety with ADLs.    Personal Factors and Comorbidities Comorbidity 3+;Time since onset of injury/illness/exacerbation    Comorbidities diabetes, hypertension, hyperlipidemia    Examination-Activity Limitations Squat;Stairs;Transfers    Examination-Participation Restrictions [place]    Stability/Clinical Decision Making Evolving/Moderate complexity    Rehab Potential Fair    PT Frequency 2x / week    PT Duration 8 weeks    PT Treatment/Interventions Moist Heat;Electrical Stimulation;Gait training;Stair training;Functional mobility training;Therapeutic activities;Therapeutic exercise;Balance training;Neuromuscular re-education;Patient/family education;Manual techniques;Passive range of motion;Energy conservation    PT Next Visit Plan obstacle course, coordination, dual task, endurance, continue POC as previously indicated    PT Home Exercise  Plan 11/30: Access  Code: [email or web address]    Consulted and Agree with Plan of Care Patient             Patient will benefit from skilled therapeutic intervention in order to improve the following deficits and impairments:  Abnormal gait, Decreased endurance, Impaired sensation, Decreased activity tolerance, Decreased strength, Decreased balance, Decreased mobility, Difficulty walking, Decreased range of motion, Decreased safety awareness, Improper body mechanics  Visit Diagnosis: Muscle weakness (generalized)  Other abnormalities of gait and mobility  Unsteadiness on feet     Problem List There are no problems to display for this patient.   Zollie Pee, PT 07/04/2021, 3:45 PM  [place] MAIN [facility] SERVICES [address] [place], [place], [address] Phone: [phone]   Fax:  [phone]  Name: Michael Cohen MRN: [id number] Date of Birth: 05-08-1941

## 2021-07-06 ENCOUNTER — Ambulatory Visit: Payer: Medicare Other

## 2021-07-13 ENCOUNTER — Ambulatory Visit: Payer: Medicare Other

## 2021-07-20 ENCOUNTER — Ambulatory Visit: Payer: Medicare Other

## 2021-07-25 ENCOUNTER — Ambulatory Visit: Payer: Medicare Other | Attending: Podiatry

## 2021-07-25 ENCOUNTER — Other Ambulatory Visit: Payer: Self-pay

## 2021-07-25 DIAGNOSIS — R2689 Other abnormalities of gait and mobility: Secondary | ICD-10-CM | POA: Insufficient documentation

## 2021-07-25 DIAGNOSIS — R278 Other lack of coordination: Secondary | ICD-10-CM | POA: Diagnosis present

## 2021-07-25 DIAGNOSIS — M25552 Pain in left hip: Secondary | ICD-10-CM | POA: Insufficient documentation

## 2021-07-25 DIAGNOSIS — R2681 Unsteadiness on feet: Secondary | ICD-10-CM | POA: Insufficient documentation

## 2021-07-25 DIAGNOSIS — M6281 Muscle weakness (generalized): Secondary | ICD-10-CM | POA: Diagnosis present

## 2021-07-25 DIAGNOSIS — R269 Unspecified abnormalities of gait and mobility: Secondary | ICD-10-CM | POA: Diagnosis present

## 2021-07-25 NOTE — Therapy (Signed)
[place] MAIN [facility] SERVICES [address] [place], [place], [address] Phone: [phone]   Fax:  [phone]  Physical Therapy Treatment/RECERT  Patient Details  Name: Michael Cohen MRN: [id number] Date of Birth: 1941/04/23 No data recorded  Encounter Date: 07/25/2021   PT End of Session - 07/25/21 1442     Visit Number 80    Number of Visits 38    Date for PT Re-Evaluation 10/17/21    Authorization Type Medicare Parts A & B    Authorization Time Period 07/25/2021-10/17/2021    PT Start Time 1349    PT Stop Time 1431    PT Time Calculation (min) 42 min    Equipment Utilized During Treatment Gait belt    Activity Tolerance Patient tolerated treatment well    Behavior During Therapy WFL for tasks assessed/performed             Past Medical History:  Diagnosis Date   Diabetes mellitus without complication ([place])    Hypercholesteremia    Hypertension     Past Surgical History:  Procedure Laterality Date   CATARACT EXTRACTION Bilateral    COLONOSCOPY WITH PROPOFOL N/A 12/12/2017   Procedure: COLONOSCOPY WITH PROPOFOL;  Surgeon: [name], [name], MD;  Location: [facility] ENDOSCOPY;  Service: Gastroenterology;  Laterality: N/A;   EYE SURGERY      There were no vitals filed for this visit.  INTERVENTIONS - Goals reassessed for recert. See goal section for full details   FOTO - 66  5XSTS - 10 sec  6MWT - 715 ft with onset of L hip pain at minute 4. Improves with sitting/rest after test. Pt score has improved from prior assessment but is still below max distance walked back in August.   BERG- 49/56  Obstacle negotiation and SLB with orange hurdle forward/backward, side-stepping x multiple reps of each. Pt cued to perform with decreasing levels of UE support. Pt requires at least intermittent UE support to maintain balance with interventions.   SLB 2x30 sec each LE; requires at least 1 finger support on bar  Tandem stance 2x30 sec  each LE; the pt requires at least 1 finger support on bar  NBOS EO with vertical head turns - 10x; pt rates as easy  PT provides education throughout on testing, technique with each test, and indications of pt's performance. Pt verbalizes understanding and is able to demonstrate correct technique with tests.   Pt educated throughout session about proper posture and technique with exercises. Improved exercise technique, movement at target joints, use of target muscles after min to mod verbal, visual, tactile cues.     PT Education - 07/25/21 1441     Education Details Goal reassessment, indications for PT/POC, exercise technique, body mechanics    Person(s) Educated Patient    Methods Explanation;Demonstration;Verbal cues    Comprehension Verbalized understanding;Returned demonstration;Need further instruction;Verbal cues required              PT Short Term Goals - 07/25/21 1436       PT SHORT TERM GOAL #1   Title Patient will be independent in home exercise program to improve strength/mobility for better functional independence with ADLs.    Baseline patient is compliant with all his HEP; attending water aerobics on days he's not coming to therapy.    Time 4    Period Weeks    Status Achieved    Target Date 08/03/20      PT SHORT TERM GOAL #2  Title Patient will increase RLE gross strength to 4+/5 as to improve functional strength for independent gait, increased standing tolerance and increased ADL ability.    Baseline 12/21: RLE gross strength 4/5 2/23: see note; 4/11: grossly 5/5 BLEs; 7/18: grossly 5/5 BLEs    Time 4    Period Weeks    Status Achieved    Target Date 08/03/20               PT Long Term Goals - 07/25/21 1436       PT LONG TERM GOAL #1   Title Patient will increase FOTO score to equal to or greater than 67 to demonstrate statistically significant improvement in mobility and quality of life.    Baseline 12/21: 57 2/23: 74%, 5/23: 55%, 6/15: 57%;  7/18: 70% 8/15: 66% (previously achieved); 9/13 69%, 10/26: 61%; 11/30: 59; 1/9: 66    Time 12    Period Weeks    Status Partially Met    Target Date 10/17/21      PT LONG TERM GOAL #2   Title Patient (> 21 years old) will complete five times sit to stand test in <12 seconds indicating an increased LE strength and improved balance.    Baseline 12/21: 17.65s, 01/26: 14.47s; 3/16: 13.17sec; 4/11: 8.5 sec, 5/23: 15.25 sec, 6/15: 8.25 sec with arms across chest; 9/12: 11.36 sec; 1/9: 10 seconds    Time 8    Period Weeks    Status Achieved      PT LONG TERM GOAL #3   Title Patient will increase six minute walk test distance to >1000 for progression to community ambulator and improve gait ability    Baseline 12/21: 500, 01/26: 655 ft 2/23: 735 ft; 3/16: 638f; 4/11: 737 ft, 6/15: 830 feet; 7/18: 810 ft 8/15: 865 ft 9/12: 870 ft, 10/26: 610 feet; 11/30: 655 ft pt with onset of L hip pain to 5/10l 1/9: 715 ft    Time 12    Period Weeks    Status On-going    Target Date 10/17/21      PT LONG TERM GOAL #4   Title Patient will reduce timed up and go to <11 seconds to reduce fall risk and demonstrate improved transfer/gait ability.    Baseline 12/21: 16.78s, 01/26: 12.08s 2/23: 10 sec, 5/23: 10 sec, 6/15: 8.8 sec    Time 8    Period Weeks    Status Achieved      PT LONG TERM GOAL #5   Title Patient will increase lower extremity functional scale to >60/80 to demonstrate improved functional mobility and increased tolerance with ADLs.    Baseline 12/21: 50/80, 01/26: 62/80, 6/15:61/80    Time 8    Period Weeks    Status Achieved    Target Date 07/06/21      PT LONG TERM GOAL #6   Title Patient will increase Berg Balance score by > 6 points to demonstrate decreased fall risk during functional activities.    Baseline 9/12: 47/56, 10/26: 49/56; 11/30: 49/56; 1/9: 49/56    Time 4    Period Weeks    Status On-going    Target Date 10/17/21                   Plan - 07/25/21 1443      Clinical Impression Statement Goals reassessed for recertification on this date. Pt shows improvement with increased FOTO and 6MWT scores, indicating increased QOL, perceived functional mobility and gait ability/functional capacity. While  pt has improved 6MWT score, he is still below performance from back in August. Pt also with onset of L hip pain around 4 minutes into 6MWT and a noticable decrease in B step length around this time as well. Pt reported a decrease in L hip pain once sitting after test. Pt BERG score remained unchanged from previous assessment, where pt is still most challenged with SLB and tandem stance. This skill will be an area of focus in future sessions in order to improve patient's obstacle negotiation and decrease his fall risk. While the pt previously met his 5xSTS goal, PT reassessed to confirm that pt has not experienced a regression. Pt improved his 5xSTS time on this date, indicating improved BLE power and a decrease in his fall risk. Patient's condition has the potential to improve in response to therapy. Maximum improvement is yet to be obtained. The anticipated improvement is attainable and reasonable in a generally predictable time. The pt will benefit from further skilled PT to improve endurance, functional capacity, gait and balance to increase ease and safety with ADLs.    Personal Factors and Comorbidities Comorbidity 3+;Time since onset of injury/illness/exacerbation    Comorbidities diabetes, hypertension, hyperlipidemia    Examination-Activity Limitations Squat;Stairs;Transfers    Examination-Participation Restrictions Church    Stability/Clinical Decision Making Evolving/Moderate complexity    Rehab Potential Fair    PT Frequency 2x / week    PT Duration 8 weeks    PT Treatment/Interventions Moist Heat;Electrical Stimulation;Gait training;Stair training;Functional mobility training;Therapeutic activities;Therapeutic exercise;Balance training;Neuromuscular  re-education;Patient/family education;Manual techniques;Passive range of motion;Energy conservation    PT Next Visit Plan obstacle course, coordination, dual task, endurance, SLB, tandem stance, obstacle negotiation    PT Home Exercise Plan 11/30: Access Code: [email or web address]    Consulted and Agree with Plan of Care Patient             Patient will benefit from skilled therapeutic intervention in order to improve the following deficits and impairments:  Abnormal gait, Decreased endurance, Impaired sensation, Decreased activity tolerance, Decreased strength, Decreased balance, Decreased mobility, Difficulty walking, Decreased range of motion, Decreased safety awareness, Improper body mechanics  Visit Diagnosis: Other abnormalities of gait and mobility  Unsteadiness on feet  Pain in left hip     Problem List There are no problems to display for this patient.   Zollie Pee, PT 07/25/2021, 2:53 PM  [place] MAIN [facility] SERVICES [address] [place], [place], [address] Phone: [phone]   Fax:  [phone]  Name: Audy Dauphine MRN: [id number] Date of Birth: [birth year]

## 2021-07-27 ENCOUNTER — Ambulatory Visit: Payer: Medicare Other

## 2021-08-01 ENCOUNTER — Ambulatory Visit: Payer: Medicare Other

## 2021-08-01 ENCOUNTER — Other Ambulatory Visit: Payer: Self-pay

## 2021-08-01 DIAGNOSIS — M6281 Muscle weakness (generalized): Secondary | ICD-10-CM

## 2021-08-01 DIAGNOSIS — R2681 Unsteadiness on feet: Secondary | ICD-10-CM

## 2021-08-01 DIAGNOSIS — R2689 Other abnormalities of gait and mobility: Secondary | ICD-10-CM | POA: Diagnosis not present

## 2021-08-01 NOTE — Therapy (Signed)
[place] MAIN [facility] SERVICES [address] [place], [place], [address] Phone: [phone]   Fax:  [phone]  Physical Therapy Treatment  Patient Details  Name: Michael Cohen MRN: [id number] Date of Birth: 1941/06/29 No data recorded  Encounter Date: 08/01/2021   PT End of Session - 08/01/21 1633     Visit Number 22    Number of Visits 7    Date for PT Re-Evaluation 10/17/21    Authorization Type Medicare Parts A & B    Authorization Time Period 07/25/2021-10/17/2021    PT Start Time 1348    PT Stop Time 1429    PT Time Calculation (min) 41 min    Equipment Utilized During Treatment Gait belt    Activity Tolerance Patient tolerated treatment well    Behavior During Therapy WFL for tasks assessed/performed             Past Medical History:  Diagnosis Date   Diabetes mellitus without complication ([place])    Hypercholesteremia    Hypertension     Past Surgical History:  Procedure Laterality Date   CATARACT EXTRACTION Bilateral    COLONOSCOPY WITH PROPOFOL N/A 12/12/2017   Procedure: COLONOSCOPY WITH PROPOFOL;  Surgeon: [name], [name], MD;  Location: [facility] ENDOSCOPY;  Service: Gastroenterology;  Laterality: N/A;   EYE SURGERY      There were no vitals filed for this visit.   Subjective Assessment - 08/01/21 1348     Subjective Pt reports he can tell his walking mechanics are off (not new) and he reports no pain.    Pertinent History 81 y.o. 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 His pain began around a month ago and is located over the proximal lateral thigh and in the right buttocks. He describes his pain as achy more than sharp. He is not sure of any aggravating activities. He reports of having some pain at rest today. He reports associated pain or "tightness" felt along anterior and lateral portions of the lower leg. He was previously evaluated by [name]  in podiatry and diagnosed with tibialis tendinitis. He denies associated groin pain, denies radicular symptoms. He has tried Baclofen without any relief of his symptoms. Of note, the patient states that he was scheduled to go on a cruise this week but was canceled due to the pain he is experiencing. He states that he would be unable to walk around and participate fully in his vacation given his pain. An anteroposterior view of the pelvis and anteroposterior and lateral views of the right hip were obtained. Images reveal mild loss of femoral acetabular joint space with mild osteophyte formation noted. CAM deformity of the right hip noted. No fractures or dislocations. Anteroposterior, oblique, and lateral views of the lumbar spine were obtained. Images reveal no scoliosis. Mild loss of lumbar lordosis noted. Significant loss of disc space noted at L4-L5 and L5-S1. Intervertebral osteophyte formation of L4 and L5. No fractures or wedging noted. Calcification of the abdominal aorta noted. Degeneration of the lumbar facets noted. Patient was recently diagnosed with lumbar radiculoapthy due to symptoms being more consistent vs hip pathology given the absence of hip symptoms with provocative maneuvers. Prednisone tapers have been considered but is on hold for now due to patient's diabetes.    Limitations Walking;Standing;Lifting;Sitting    How long can you sit comfortably? Few hours    How long can you stand comfortably? 30 mins  How long can you walk comfortably? 1 hour    Currently in Pain? No/denies    Pain Onset More than a month ago    Pain Onset In the past 7 days             INTERVENTIONS -  STS 10x hands-free. PT provides cuing for technique.  --progressed to 10x with 2000 gr ball; pt rates as medium  Slow march through agility ladder with step up onto airex pad. Pt performs with decreasing levels of UE support until he performs with intermittent UE support. The pt performs multiple reps. He  exhibits difficulty with increasing hip flexion for march and difficulty with eccentric control.   Cone heel taps with decreasing levels of UE support until pt performing with intermittent UE support 4x10-15 reps. Pt difficulty with eccentric control   Tandem stance 2x45 sec each LE; pt requires intermittent UE support on bar  One foot on floor one on airex for SLB progression - 2x30 sec each LE    Treadmill training for cardiorespiratory and muscular endurance -patient ambulates up to 2.2 mph and achieves a max heart rate of 93 bpm x 7 minutes. CGA was provided throughout for safety, PT also continues to provide cuing for proximity to handrails, upright posture, and heel-toe sequencing. Pt monitored for response throughout.  PT issues and reviews HEP with pt:  Access Code: [email or web address] URL: [email or web address] Date: 08/01/2021 Prepared by: Ricard Dillon  Exercises Sit to Stand Without Arm Support - 1 x daily - 5 x weekly - 3 sets - 10 reps Standing Tandem Balance with Counter Support - 1 x daily - 5 x weekly - 2 sets - 2 reps - 30 hold   Pt educated throughout session about proper posture and technique with exercises. Improved exercise technique, movement at target joints, use of target muscles after min to mod verbal, visual, tactile cues.     PT Education - 08/01/21 1633     Education Details HEP, exercise technique, body mechanics    Person(s) Educated Patient    Methods Explanation;Demonstration;Verbal cues;Handout    Comprehension Verbalized understanding;Returned demonstration;Need further instruction;Verbal cues required              PT Short Term Goals - 07/25/21 1436       PT SHORT TERM GOAL #1   Title Patient will be independent in home exercise program to improve strength/mobility for better functional independence with ADLs.    Baseline patient is compliant with all his HEP; attending water aerobics on days he's not coming to therapy.    Time 4     Period Weeks    Status Achieved    Target Date 08/03/20      PT SHORT TERM GOAL #2   Title Patient will increase RLE gross strength to 4+/5 as to improve functional strength for independent gait, increased standing tolerance and increased ADL ability.    Baseline 12/21: RLE gross strength 4/5 2/23: see note; 4/11: grossly 5/5 BLEs; 7/18: grossly 5/5 BLEs    Time 4    Period Weeks    Status Achieved    Target Date 08/03/20               PT Long Term Goals - 07/25/21 1436       PT LONG TERM GOAL #1   Title Patient will increase FOTO score to equal to or greater than 67 to demonstrate statistically significant improvement in mobility and quality of life.    Baseline  12/21: 57 2/23: 74%, 5/23: 55%, 6/15: 57%; 7/18: 70% 8/15: 66% (previously achieved); 9/13 69%, 10/26: 61%; 11/30: 59; 1/9: 66    Time 12    Period Weeks    Status Partially Met    Target Date 10/17/21      PT LONG TERM GOAL #2   Title Patient (> 84 years old) will complete five times sit to stand test in <12 seconds indicating an increased LE strength and improved balance.    Baseline 12/21: 17.65s, 01/26: 14.47s; 3/16: 13.17sec; 4/11: 8.5 sec, 5/23: 15.25 sec, 6/15: 8.25 sec with arms across chest; 9/12: 11.36 sec; 1/9: 10 seconds    Time 8    Period Weeks    Status Achieved      PT LONG TERM GOAL #3   Title Patient will increase six minute walk test distance to >1000 for progression to community ambulator and improve gait ability    Baseline 12/21: 500, 01/26: 655 ft 2/23: 735 ft; 3/16: 632f; 4/11: 737 ft, 6/15: 830 feet; 7/18: 810 ft 8/15: 865 ft 9/12: 870 ft, 10/26: 610 feet; 11/30: 655 ft pt with onset of L hip pain to 5/10l 1/9: 715 ft    Time 12    Period Weeks    Status On-going    Target Date 10/17/21      PT LONG TERM GOAL #4   Title Patient will reduce timed up and go to <11 seconds to reduce fall risk and demonstrate improved transfer/gait ability.    Baseline 12/21: 16.78s, 01/26: 12.08s 2/23: 10  sec, 5/23: 10 sec, 6/15: 8.8 sec    Time 8    Period Weeks    Status Achieved      PT LONG TERM GOAL #5   Title Patient will increase lower extremity functional scale to >60/80 to demonstrate improved functional mobility and increased tolerance with ADLs.    Baseline 12/21: 50/80, 01/26: 62/80, 6/15:61/80    Time 8    Period Weeks    Status Achieved    Target Date 07/06/21      PT LONG TERM GOAL #6   Title Patient will increase Berg Balance score by > 6 points to demonstrate decreased fall risk during functional activities.    Baseline 9/12: 47/56, 10/26: 49/56; 11/30: 49/56; 1/9: 49/56    Time 4    Period Weeks    Status On-going    Target Date 10/17/21                   Plan - 08/01/21 1637     Clinical Impression Statement PT issued HEP this session that focuses on tandem stance with counter support and STS to further address balance and LE strength/power. Regarding endurance, pt was able to sustain up to 2.2 mph on treadmill (previously achieved), however, pt still had difficulty sustaining improved step-length B and heel-toe sequencing without multiple cues. The pt did not have pain with interventions. The pt will benefit from further skilled PT to continue to improve endurance, functional capacity, gait and balance to increase ease and safety with ADLs.    Personal Factors and Comorbidities Comorbidity 3+;Time since onset of injury/illness/exacerbation    Comorbidities diabetes, hypertension, hyperlipidemia    Examination-Activity Limitations Squat;Stairs;Transfers    Examination-Participation Restrictions Church    Stability/Clinical Decision Making Evolving/Moderate complexity    Rehab Potential Fair    PT Frequency 2x / week    PT Duration 8 weeks    PT Treatment/Interventions Moist Heat;Electrical Stimulation;Gait training;Stair  training;Functional mobility training;Therapeutic activities;Therapeutic exercise;Balance training;Neuromuscular  re-education;Patient/family education;Manual techniques;Passive range of motion;Energy conservation    PT Next Visit Plan obstacle course, coordination, dual task, endurance, SLB, tandem stance, obstacle negotiation    PT Home Exercise Plan 11/30: Access Code: [email or web address]; 1/16: Access Code: [email or web address]    Consulted and Agree with Plan of Care Patient             Patient will benefit from skilled therapeutic intervention in order to improve the following deficits and impairments:  Abnormal gait, Decreased endurance, Impaired sensation, Decreased activity tolerance, Decreased strength, Decreased balance, Decreased mobility, Difficulty walking, Decreased range of motion, Decreased safety awareness, Improper body mechanics  Visit Diagnosis: Unsteadiness on feet  Muscle weakness (generalized)  Other abnormalities of gait and mobility     Problem List There are no problems to display for this patient.   Zollie Pee, PT 08/01/2021, 4:43 PM  [place] MAIN [facility] SERVICES [address] [place], [place], [address] Phone: [phone]   Fax:  [phone]  Name: Michael Cohen MRN: [id number] Date of Birth: 1941/04/06

## 2021-08-03 ENCOUNTER — Ambulatory Visit: Payer: Medicare Other

## 2021-08-08 ENCOUNTER — Other Ambulatory Visit: Payer: Self-pay

## 2021-08-08 ENCOUNTER — Ambulatory Visit: Payer: Medicare Other | Admitting: Physical Therapy

## 2021-08-08 DIAGNOSIS — R2681 Unsteadiness on feet: Secondary | ICD-10-CM

## 2021-08-08 DIAGNOSIS — R2689 Other abnormalities of gait and mobility: Secondary | ICD-10-CM | POA: Diagnosis not present

## 2021-08-08 DIAGNOSIS — R269 Unspecified abnormalities of gait and mobility: Secondary | ICD-10-CM

## 2021-08-08 DIAGNOSIS — M6281 Muscle weakness (generalized): Secondary | ICD-10-CM

## 2021-08-08 DIAGNOSIS — R278 Other lack of coordination: Secondary | ICD-10-CM

## 2021-08-08 NOTE — Therapy (Signed)
[place] MAIN [facility] SERVICES [address] [place], [place], [address] Phone: [phone]   Fax:  [phone]  Physical Therapy Treatment  Patient Details  Name: Michael Cohen MRN: [id number] Date of Birth: [birth year] No data recorded  Encounter Date: 08/08/2021   PT End of Session - 08/08/21 1440     Visit Number 81    Number of Visits 70    Date for PT Re-Evaluation 10/17/21    Authorization Type Medicare Parts A & B    Authorization Time Period 07/25/2021-10/17/2021    PT Start Time 1349    PT Stop Time 1431    PT Time Calculation (min) 42 min    Equipment Utilized During Treatment Gait belt    Activity Tolerance Patient tolerated treatment well    Behavior During Therapy WFL for tasks assessed/performed             Past Medical History:  Diagnosis Date   Diabetes mellitus without complication ([place])    Hypercholesteremia    Hypertension     Past Surgical History:  Procedure Laterality Date   CATARACT EXTRACTION Bilateral    COLONOSCOPY WITH PROPOFOL N/A 12/12/2017   Procedure: COLONOSCOPY WITH PROPOFOL;  Surgeon: [name], [name], MD;  Location: [facility] ENDOSCOPY;  Service: Gastroenterology;  Laterality: N/A;   EYE SURGERY      There were no vitals filed for this visit.   Subjective Assessment - 08/08/21 1439     Subjective Pt states he is doing well today. Reports no new concerns/falls/LOB since last session. Denies pain.    Pertinent History 81 y.o. 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 His pain began around a month ago and is located over the proximal lateral thigh and in the right buttocks. He describes his pain as achy more than sharp. He is not sure of any aggravating activities. He reports of having some pain at rest today. He reports associated pain or "tightness" felt along anterior and lateral portions of the lower leg. He was previously evaluated  by [name] in podiatry and diagnosed with tibialis tendinitis. He denies associated groin pain, denies radicular symptoms. He has tried Baclofen without any relief of his symptoms. Of note, the patient states that he was scheduled to go on a cruise this week but was canceled due to the pain he is experiencing. He states that he would be unable to walk around and participate fully in his vacation given his pain. An anteroposterior view of the pelvis and anteroposterior and lateral views of the right hip were obtained. Images reveal mild loss of femoral acetabular joint space with mild osteophyte formation noted. CAM deformity of the right hip noted. No fractures or dislocations. Anteroposterior, oblique, and lateral views of the lumbar spine were obtained. Images reveal no scoliosis. Mild loss of lumbar lordosis noted. Significant loss of disc space noted at L4-L5 and L5-S1. Intervertebral osteophyte formation of L4 and L5. No fractures or wedging noted. Calcification of the abdominal aorta noted. Degeneration of the lumbar facets noted. Patient was recently diagnosed with lumbar radiculoapthy due to symptoms being more consistent vs hip pathology given the absence of hip symptoms with provocative maneuvers. Prednisone tapers have been considered but is on hold for now due to patient's diabetes.    Limitations Walking;Standing;Lifting;Sitting    How long can you sit comfortably? Few hours    How long can you stand comfortably? 30 mins  How long can you walk comfortably? 1 hour    Currently in Pain? No/denies    Pain Onset More than a month ago    Pain Onset In the past 7 days              INTERVENTIONS -   STS 10x hands-free. PT provides cuing for technique.    Cone toe-to-heel taps x15 each LE (cone double stacked). Increased difficulty with SLS on RLE. Difficulty with eccentric control BLE. VC to remain behind cones.  Alternating toe taps to cone (double stacked for first set, single cone  final two sets), 3 x 10 each side.  -progressed with lateral stepping - two taps to each cone separated by a side step. Travelled in bilateral directions. 3 cones set up; x8 laps. Pt picked up cones as they fell over. Performed multiple times throughout exercise.  Intermittent UE support with all cone exercises.  Encouragement to decrease UE support.   Treadmill training for cardiorespiratory and muscular endurance -patient ambulates up to 2.3 mph and achieves a max heart rate of 91 bpm x 7.5 minutes. CGA was provided throughout for safety, PT also continues to provide cuing for proximity to handrails, upright posture, heel-toe sequencing and R knee drive. Pt monitored for response throughout.   Agility ladder - forward stepping: 1 step in each, x2 reps; 2 feet in each, x2 reps leading with each LE. - "in-in-out" sequence; improved as pt learned sequence.  VC for foot clearance and sequencing with all patterns.      Pt educated throughout session about proper posture and technique with exercises. Improved exercise technique, movement at target joints, use of target muscles after min to mod verbal, visual, tactile cues.   Clinical Impression: Pt pleasant and motivated; requires encouragement to challenge himself in tasks that are challenging. Pt progressed gait velocity on treadmill to 2.37mh and held this speed for 2 minutes. Cardiovascular exercise followed by balance training. Pt had difficulty with initiating and sustaining SL balance as well as controlling the opposite leg during tapping. He demo good stepping strategy on a couple occasions. With practice and repetition, performance improved. CGA provided at all times for light to heavy steadying.  The pt will benefit from further skilled PT to continue to improve endurance, functional capacity, gait and balance to increase ease and safety with ADLs.             PT Short Term Goals - 07/25/21 1436       PT SHORT TERM GOAL #1    Title Patient will be independent in home exercise program to improve strength/mobility for better functional independence with ADLs.    Baseline patient is compliant with all his HEP; attending water aerobics on days he's not coming to therapy.    Time 4    Period Weeks    Status Achieved    Target Date 08/03/20      PT SHORT TERM GOAL #2   Title Patient will increase RLE gross strength to 4+/5 as to improve functional strength for independent gait, increased standing tolerance and increased ADL ability.    Baseline 12/21: RLE gross strength 4/5 2/23: see note; 4/11: grossly 5/5 BLEs; 7/18: grossly 5/5 BLEs    Time 4    Period Weeks    Status Achieved    Target Date 08/03/20               PT Long Term Goals - 07/25/21 1436  PT LONG TERM GOAL #1   Title Patient will increase FOTO score to equal to or greater than 67 to demonstrate statistically significant improvement in mobility and quality of life.    Baseline 12/21: 57 2/23: 74%, 5/23: 55%, 6/15: 57%; 7/18: 70% 8/15: 66% (previously achieved); 9/13 69%, 10/26: 61%; 11/30: 59; 1/9: 66    Time 12    Period Weeks    Status Partially Met    Target Date 10/17/21      PT LONG TERM GOAL #2   Title Patient (> 55 years old) will complete five times sit to stand test in <12 seconds indicating an increased LE strength and improved balance.    Baseline 12/21: 17.65s, 01/26: 14.47s; 3/16: 13.17sec; 4/11: 8.5 sec, 5/23: 15.25 sec, 6/15: 8.25 sec with arms across chest; 9/12: 11.36 sec; 1/9: 10 seconds    Time 8    Period Weeks    Status Achieved      PT LONG TERM GOAL #3   Title Patient will increase six minute walk test distance to >1000 for progression to community ambulator and improve gait ability    Baseline 12/21: 500, 01/26: 655 ft 2/23: 735 ft; 3/16: 662f; 4/11: 737 ft, 6/15: 830 feet; 7/18: 810 ft 8/15: 865 ft 9/12: 870 ft, 10/26: 610 feet; 11/30: 655 ft pt with onset of L hip pain to 5/10l 1/9: 715 ft    Time 12     Period Weeks    Status On-going    Target Date 10/17/21      PT LONG TERM GOAL #4   Title Patient will reduce timed up and go to <11 seconds to reduce fall risk and demonstrate improved transfer/gait ability.    Baseline 12/21: 16.78s, 01/26: 12.08s 2/23: 10 sec, 5/23: 10 sec, 6/15: 8.8 sec    Time 8    Period Weeks    Status Achieved      PT LONG TERM GOAL #5   Title Patient will increase lower extremity functional scale to >60/80 to demonstrate improved functional mobility and increased tolerance with ADLs.    Baseline 12/21: 50/80, 01/26: 62/80, 6/15:61/80    Time 8    Period Weeks    Status Achieved    Target Date 07/06/21      PT LONG TERM GOAL #6   Title Patient will increase Berg Balance score by > 6 points to demonstrate decreased fall risk during functional activities.    Baseline 9/12: 47/56, 10/26: 49/56; 11/30: 49/56; 1/9: 49/56    Time 4    Period Weeks    Status On-going    Target Date 10/17/21                   Plan - 08/08/21 1440     Clinical Impression Statement Pt pleasant and motivated; requires encouragement to challenge himself in tasks that are challenging. Pt progressed gait velocity on treadmill to 2.368m and held this speed for 2 minutes. Cardiovascular exercise followed by balance training. Pt had difficulty with initiating and sustaining SL balance as well as controlling the opposite leg during tapping. He demo good stepping strategy on a couple occasions. With practice and repetition, performance improved. CGA provided at all times for light to heavy steadying.  The pt will benefit from further skilled PT to continue to improve endurance, functional capacity, gait and balance to increase ease and safety with ADLs.    Personal Factors and Comorbidities Comorbidity 3+;Time since onset of injury/illness/exacerbation  Comorbidities diabetes, hypertension, hyperlipidemia    Examination-Activity Limitations Squat;Stairs;Transfers     Examination-Participation Restrictions Church    Stability/Clinical Decision Making Evolving/Moderate complexity    Rehab Potential Fair    PT Frequency 2x / week    PT Duration 8 weeks    PT Treatment/Interventions Moist Heat;Electrical Stimulation;Gait training;Stair training;Functional mobility training;Therapeutic activities;Therapeutic exercise;Balance training;Neuromuscular re-education;Patient/family education;Manual techniques;Passive range of motion;Energy conservation    PT Next Visit Plan obstacle course, coordination, dual task, endurance, SLB, tandem stance, obstacle negotiation    PT Home Exercise Plan 11/30: Access Code: [email or web address]; 1/16: Access Code: [email or web address]    Consulted and Agree with Plan of Care Patient             Patient will benefit from skilled therapeutic intervention in order to improve the following deficits and impairments:  Abnormal gait, Decreased endurance, Impaired sensation, Decreased activity tolerance, Decreased strength, Decreased balance, Decreased mobility, Difficulty walking, Decreased range of motion, Decreased safety awareness, Improper body mechanics  Visit Diagnosis: Unsteadiness on feet  Muscle weakness (generalized)  Other abnormalities of gait and mobility  Other lack of coordination  Gait abnormality     Problem List There are no problems to display for this patient.   Patrina Levering PT, [place] MAIN [facility] SERVICES [address] [place], [place], [address] Phone: [phone]   Fax:  [phone]  Name: Michael Cohen MRN: [id number] Date of Birth: [birth year]

## 2021-08-10 ENCOUNTER — Ambulatory Visit: Payer: Medicare Other

## 2021-08-15 ENCOUNTER — Other Ambulatory Visit: Payer: Self-pay

## 2021-08-15 ENCOUNTER — Ambulatory Visit: Payer: Medicare Other

## 2021-08-15 DIAGNOSIS — R2689 Other abnormalities of gait and mobility: Secondary | ICD-10-CM | POA: Diagnosis not present

## 2021-08-15 DIAGNOSIS — M6281 Muscle weakness (generalized): Secondary | ICD-10-CM

## 2021-08-15 DIAGNOSIS — R2681 Unsteadiness on feet: Secondary | ICD-10-CM

## 2021-08-15 NOTE — Therapy (Signed)
[place] MAIN [facility] SERVICES [address] [place], [place], [address] Phone: [phone]   Fax:  [phone]  Physical Therapy Treatment  Patient Details  Name: Michael Cohen MRN: [id number] Date of Birth: 1940-11-24 No data recorded  Encounter Date: 08/15/2021   PT End of Session - 08/15/21 1447     Visit Number 68    Number of Visits 77    Date for PT Re-Evaluation 10/17/21    Authorization Type Medicare Parts A & B    Authorization Time Period 07/25/2021-10/17/2021    PT Start Time 1346    PT Stop Time 1429    PT Time Calculation (min) 43 min    Equipment Utilized During Treatment Gait belt    Activity Tolerance Patient tolerated treatment well    Behavior During Therapy WFL for tasks assessed/performed             Past Medical History:  Diagnosis Date   Diabetes mellitus without complication ([place])    Hypercholesteremia    Hypertension     Past Surgical History:  Procedure Laterality Date   CATARACT EXTRACTION Bilateral    COLONOSCOPY WITH PROPOFOL N/A 12/12/2017   Procedure: COLONOSCOPY WITH PROPOFOL;  Surgeon: [name], [name], MD;  Location: [facility] ENDOSCOPY;  Service: Gastroenterology;  Laterality: N/A;   EYE SURGERY      There were no vitals filed for this visit.   Subjective Assessment - 08/15/21 1344     Subjective Pt reports no pain currently. Pt reports no LOB. Pt reports the bottoms of his feet have been hurting due to neuropathy.    Pertinent History 81 y.o. 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 His pain began around a month ago and is located over the proximal lateral thigh and in the right buttocks. He describes his pain as achy more than sharp. He is not sure of any aggravating activities. He reports of having some pain at rest today. He reports associated pain or "tightness" felt along anterior and lateral portions of the lower leg. He was  previously evaluated by [name] in podiatry and diagnosed with tibialis tendinitis. He denies associated groin pain, denies radicular symptoms. He has tried Baclofen without any relief of his symptoms. Of note, the patient states that he was scheduled to go on a cruise this week but was canceled due to the pain he is experiencing. He states that he would be unable to walk around and participate fully in his vacation given his pain. An anteroposterior view of the pelvis and anteroposterior and lateral views of the right hip were obtained. Images reveal mild loss of femoral acetabular joint space with mild osteophyte formation noted. CAM deformity of the right hip noted. No fractures or dislocations. Anteroposterior, oblique, and lateral views of the lumbar spine were obtained. Images reveal no scoliosis. Mild loss of lumbar lordosis noted. Significant loss of disc space noted at L4-L5 and L5-S1. Intervertebral osteophyte formation of L4 and L5. No fractures or wedging noted. Calcification of the abdominal aorta noted. Degeneration of the lumbar facets noted. Patient was recently diagnosed with lumbar radiculoapthy due to symptoms being more consistent vs hip pathology given the absence of hip symptoms with provocative maneuvers. Prednisone tapers have been considered but is on hold for now due to patient's diabetes.    Limitations Walking;Standing;Lifting;Sitting    How long can you sit comfortably? Few hours    How long can  you stand comfortably? 30 mins    How long can you walk comfortably? 1 hour    Currently in Pain? No/denies    Pain Onset More than a month ago    Pain Onset In the past 7 days            INTERVENTIONS - Gait belt donned and CGA provided throughout unless otherwise specified   Cone toe-to-heel taps with decreasing levels of UE support from BUE to 2 finger support, to attempting with no UE support. The pt performs several reps for several minutes. Pt knocks over cone 2x. Pt  improves technique with reps. He is most challenged with the heel tap.  Airex beam lateral stepping with decreasing levels of UE support. Pt performs multiple reps of each. He begins with BUE support, then UUE support, two-finger support and finally no UE support. He exhibits slight decrease in postural stability but no LOB.  On airex pad: Semi tandem stance 2x30 sec each LE Semi tandem stance with vertical, horizontal head turns -15 reps for each condition performed with each LE as primary stance leg. Pt cued throughout for improved cervical AROM, as pt with tendency to decrease AROM due to unsteadiness.   Obstacle negotiation - stepping forward/backward over hurdle 20x- one instance of knocking over hurdle. Pt attempts to perform without UE support, but requires at least intermittent UE support.   SLB 2x30 sec each LE. Very challenging for pt. Pt must use at least one finger on bar to steady self  Agility ladder forward/backward ambulation - 1 foot per step with. Pt performs with decreasing levels of UE support until he performs with no UE support. Pt uses step strategy throughout, particularly with backward stepping due to unsteadiness. However, pt postural stability did improve with reps/speed.      Pt educated throughout session about proper posture and technique with exercises. Improved exercise technique, movement at target joints, use of target muscles after min to mod verbal, visual, tactile cues.     PT Education - 08/15/21 1446     Education Details exercise technique, body mechanics    Person(s) Educated Patient    Methods Explanation;Demonstration;Verbal cues    Comprehension Verbalized understanding;Returned demonstration;Need further instruction;Verbal cues required              PT Short Term Goals - 07/25/21 1436       PT SHORT TERM GOAL #1   Title Patient will be independent in home exercise program to improve strength/mobility for better functional independence  with ADLs.    Baseline patient is compliant with all his HEP; attending water aerobics on days he's not coming to therapy.    Time 4    Period Weeks    Status Achieved    Target Date 08/03/20      PT SHORT TERM GOAL #2   Title Patient will increase RLE gross strength to 4+/5 as to improve functional strength for independent gait, increased standing tolerance and increased ADL ability.    Baseline 12/21: RLE gross strength 4/5 2/23: see note; 4/11: grossly 5/5 BLEs; 7/18: grossly 5/5 BLEs    Time 4    Period Weeks    Status Achieved    Target Date 08/03/20               PT Long Term Goals - 07/25/21 1436       PT LONG TERM GOAL #1   Title Patient will increase FOTO score to equal to or greater than  67 to demonstrate statistically significant improvement in mobility and quality of life.    Baseline 12/21: 57 2/23: 74%, 5/23: 55%, 6/15: 57%; 7/18: 70% 8/15: 66% (previously achieved); 9/13 69%, 10/26: 61%; 11/30: 59; 1/9: 66    Time 12    Period Weeks    Status Partially Met    Target Date 10/17/21      PT LONG TERM GOAL #2   Title Patient (> 13 years old) will complete five times sit to stand test in <12 seconds indicating an increased LE strength and improved balance.    Baseline 12/21: 17.65s, 01/26: 14.47s; 3/16: 13.17sec; 4/11: 8.5 sec, 5/23: 15.25 sec, 6/15: 8.25 sec with arms across chest; 9/12: 11.36 sec; 1/9: 10 seconds    Time 8    Period Weeks    Status Achieved      PT LONG TERM GOAL #3   Title Patient will increase six minute walk test distance to >1000 for progression to community ambulator and improve gait ability    Baseline 12/21: 500, 01/26: 655 ft 2/23: 735 ft; 3/16: 655f; 4/11: 737 ft, 6/15: 830 feet; 7/18: 810 ft 8/15: 865 ft 9/12: 870 ft, 10/26: 610 feet; 11/30: 655 ft pt with onset of L hip pain to 5/10l 1/9: 715 ft    Time 12    Period Weeks    Status On-going    Target Date 10/17/21      PT LONG TERM GOAL #4   Title Patient will reduce timed up  and go to <11 seconds to reduce fall risk and demonstrate improved transfer/gait ability.    Baseline 12/21: 16.78s, 01/26: 12.08s 2/23: 10 sec, 5/23: 10 sec, 6/15: 8.8 sec    Time 8    Period Weeks    Status Achieved      PT LONG TERM GOAL #5   Title Patient will increase lower extremity functional scale to >60/80 to demonstrate improved functional mobility and increased tolerance with ADLs.    Baseline 12/21: 50/80, 01/26: 62/80, 6/15:61/80    Time 8    Period Weeks    Status Achieved    Target Date 07/06/21      PT LONG TERM GOAL #6   Title Patient will increase Berg Balance score by > 6 points to demonstrate decreased fall risk during functional activities.    Baseline 9/12: 47/56, 10/26: 49/56; 11/30: 49/56; 1/9: 49/56    Time 4    Period Weeks    Status On-going    Target Date 10/17/21                   Plan - 08/15/21 1454     Clinical Impression Statement Pt presented with excellent motivation to participate in session. Pt's gait appeared antalgic, however, pt reported no pain. Session largely focused on balance interventions aimed at improving SLB and obstacle negotiation. Pt still very challenged with these kinds of interventions and must utilize at least intermittent UE support to maintain balance. The pt will benefit from further skilled PT to improve balance and LE strengthening to increase QOL and decrease fall risk.    Personal Factors and Comorbidities Comorbidity 3+;Time since onset of injury/illness/exacerbation    Comorbidities diabetes, hypertension, hyperlipidemia    Examination-Activity Limitations Squat;Stairs;Transfers    Examination-Participation Restrictions Church    Stability/Clinical Decision Making Evolving/Moderate complexity    Rehab Potential Fair    PT Frequency 2x / week    PT Duration 8 weeks    PT Treatment/Interventions Moist  Heat;Electrical Stimulation;Gait training;Stair training;Functional mobility training;Therapeutic  activities;Therapeutic exercise;Balance training;Neuromuscular re-education;Patient/family education;Manual techniques;Passive range of motion;Energy conservation    PT Next Visit Plan obstacle course, coordination, dual task, endurance, SLB, tandem stance, obstacle negotiation    PT Home Exercise Plan 11/30: Access Code: [email or web address]; 1/16: Access Code: [email or web address]    Consulted and Agree with Plan of Care Patient             Patient will benefit from skilled therapeutic intervention in order to improve the following deficits and impairments:  Abnormal gait, Decreased endurance, Impaired sensation, Decreased activity tolerance, Decreased strength, Decreased balance, Decreased mobility, Difficulty walking, Decreased range of motion, Decreased safety awareness, Improper body mechanics  Visit Diagnosis: Unsteadiness on feet  Muscle weakness (generalized)  Other abnormalities of gait and mobility     Problem List There are no problems to display for this patient.   Zollie Pee, PT 08/15/2021, 3:03 PM  [place] MAIN [facility] SERVICES [address] [place], [place], [address] Phone: [phone]   Fax:  [phone]  Name: Michael Cohen MRN: [id number] Date of Birth: 1941/06/15

## 2021-08-17 ENCOUNTER — Ambulatory Visit: Payer: Medicare Other

## 2021-08-22 ENCOUNTER — Ambulatory Visit: Payer: Medicare Other

## 2021-08-24 ENCOUNTER — Ambulatory Visit: Payer: Medicare Other

## 2021-08-29 ENCOUNTER — Ambulatory Visit: Payer: Medicare Other | Attending: Podiatry

## 2021-08-29 ENCOUNTER — Other Ambulatory Visit: Payer: Self-pay

## 2021-08-29 DIAGNOSIS — R2681 Unsteadiness on feet: Secondary | ICD-10-CM | POA: Insufficient documentation

## 2021-08-29 DIAGNOSIS — M25551 Pain in right hip: Secondary | ICD-10-CM | POA: Insufficient documentation

## 2021-08-29 DIAGNOSIS — M6281 Muscle weakness (generalized): Secondary | ICD-10-CM | POA: Insufficient documentation

## 2021-08-29 DIAGNOSIS — R2689 Other abnormalities of gait and mobility: Secondary | ICD-10-CM | POA: Insufficient documentation

## 2021-08-29 NOTE — Therapy (Signed)
[place] MAIN [facility] SERVICES [address] [place], [place], [address] Phone: [phone]   Fax:  [phone]  Physical Therapy Treatment  Patient Details  Name: Michael Cohen MRN: [id number] Date of Birth: April 20, 1941 No data recorded  Encounter Date: 08/29/2021   PT End of Session - 08/29/21 1818     Visit Number 84    Number of Visits 57    Date for PT Re-Evaluation 10/17/21    Authorization Type Medicare Parts A & B    Authorization Time Period 07/25/2021-10/17/2021    PT Start Time 1348    PT Stop Time 1430    PT Time Calculation (min) 42 min    Equipment Utilized During Treatment Gait belt    Activity Tolerance Patient tolerated treatment well    Behavior During Therapy WFL for tasks assessed/performed             Past Medical History:  Diagnosis Date   Diabetes mellitus without complication ([place])    Hypercholesteremia    Hypertension     Past Surgical History:  Procedure Laterality Date   CATARACT EXTRACTION Bilateral    COLONOSCOPY WITH PROPOFOL N/A 12/12/2017   Procedure: COLONOSCOPY WITH PROPOFOL;  Surgeon: [name], [name], MD;  Location: [facility] ENDOSCOPY;  Service: Gastroenterology;  Laterality: N/A;   EYE SURGERY      There were no vitals filed for this visit.   Subjective Assessment - 08/29/21 1351     Subjective Pt reports no major changes since previous session. Reports no pain or falls.    Pertinent History 81 y.o. 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 His pain began around a month ago and is located over the proximal lateral thigh and in the right buttocks. He describes his pain as achy more than sharp. He is not sure of any aggravating activities. He reports of having some pain at rest today. He reports associated pain or "tightness" felt along anterior and lateral portions of the lower leg. He was previously evaluated by [name] in  podiatry and diagnosed with tibialis tendinitis. He denies associated groin pain, denies radicular symptoms. He has tried Baclofen without any relief of his symptoms. Of note, the patient states that he was scheduled to go on a cruise this week but was canceled due to the pain he is experiencing. He states that he would be unable to walk around and participate fully in his vacation given his pain. An anteroposterior view of the pelvis and anteroposterior and lateral views of the right hip were obtained. Images reveal mild loss of femoral acetabular joint space with mild osteophyte formation noted. CAM deformity of the right hip noted. No fractures or dislocations. Anteroposterior, oblique, and lateral views of the lumbar spine were obtained. Images reveal no scoliosis. Mild loss of lumbar lordosis noted. Significant loss of disc space noted at L4-L5 and L5-S1. Intervertebral osteophyte formation of L4 and L5. No fractures or wedging noted. Calcification of the abdominal aorta noted. Degeneration of the lumbar facets noted. Patient was recently diagnosed with lumbar radiculoapthy due to symptoms being more consistent vs hip pathology given the absence of hip symptoms with provocative maneuvers. Prednisone tapers have been considered but is on hold for now due to patient's diabetes.    Limitations Walking;Standing;Lifting;Sitting    How long can you sit comfortably? Few hours    How long can you stand comfortably? 30 mins    How  long can you walk comfortably? 1 hour    Currently in Pain? No/denies    Pain Onset More than a month ago    Pain Onset In the past 7 days                 INTERVENTIONS - Gait belt donned and CGA provided throughout unless otherwise specified   Treadmill LE muscle and cardiorespiratory endurance training - pt ambulates up to 1.8 mph x 6 min, >50% time above 1.5 mph, HR achieves 83 bpm. Pt continues to exhibit increased BUE weightbearing and reports fatigue through BUEs.  Cuing for proximity to handrails, improves step-length, heel-toe sequencing and upright posture. PT provides CGA throughout.   Stepping forward/backward over 2 hurdles with performing turns between hurdles x multiple reps. Initially starts with BUE support and able to decrease to intermittent. Pt with some difficulty  clearing obstacle with backward step.  Obstacle clearance: 2 hurdles, half-foam, and 6" step x multiple reps. Pt knocks into hurdle initially 2x, clips half-foam with heel. Pt improves with reps.  --Pt progresses intervention to addition of dual motor task 6x --Pt progresses to addition of both dual motor task and cognitive task 6x  STS with 2000 gr ball 2x12 - pt rates as hard  Cone foot taps with decreasing levels of UE support from BUE to 2 finger support, to intermittent UE support x multiple reps.  Pt knocks over cone 2x. Pt improves technique with reps.   Airex beam lateral stepping with decreasing levels of UE support in // bars x multiple reps. Pt rates as hard. Must use at least intermittent UE support.   --Pt then performs intervention forward/tandem stepping 6x length of bars.  Pt performs static tandem stance on airex beam 60 sec each LE x intermittent UE support   Pt educated throughout session about proper posture and technique with exercises. Improved exercise technique, movement at target joints, use of target muscles after min to mod verbal, visual, tactile cues.      PT Education - 08/29/21 1818     Education Details exercise technique    Person(s) Educated Patient    Methods Explanation;Demonstration;Verbal cues    Comprehension Verbalized understanding;Returned demonstration;Verbal cues required;Need further instruction              PT Short Term Goals - 07/25/21 1436       PT SHORT TERM GOAL #1   Title Patient will be independent in home exercise program to improve strength/mobility for better functional independence with ADLs.    Baseline  patient is compliant with all his HEP; attending water aerobics on days he's not coming to therapy.    Time 4    Period Weeks    Status Achieved    Target Date 08/03/20      PT SHORT TERM GOAL #2   Title Patient will increase RLE gross strength to 4+/5 as to improve functional strength for independent gait, increased standing tolerance and increased ADL ability.    Baseline 12/21: RLE gross strength 4/5 2/23: see note; 4/11: grossly 5/5 BLEs; 7/18: grossly 5/5 BLEs    Time 4    Period Weeks    Status Achieved    Target Date 08/03/20               PT Long Term Goals - 07/25/21 1436       PT LONG TERM GOAL #1   Title Patient will increase FOTO score to equal to or greater than 67  to demonstrate statistically significant improvement in mobility and quality of life.    Baseline 12/21: 57 2/23: 74%, 5/23: 55%, 6/15: 57%; 7/18: 70% 8/15: 66% (previously achieved); 9/13 69%, 10/26: 61%; 11/30: 59; 1/9: 66    Time 12    Period Weeks    Status Partially Met    Target Date 10/17/21      PT LONG TERM GOAL #2   Title Patient (> 54 years old) will complete five times sit to stand test in <12 seconds indicating an increased LE strength and improved balance.    Baseline 12/21: 17.65s, 01/26: 14.47s; 3/16: 13.17sec; 4/11: 8.5 sec, 5/23: 15.25 sec, 6/15: 8.25 sec with arms across chest; 9/12: 11.36 sec; 1/9: 10 seconds    Time 8    Period Weeks    Status Achieved      PT LONG TERM GOAL #3   Title Patient will increase six minute walk test distance to >1000 for progression to community ambulator and improve gait ability    Baseline 12/21: 500, 01/26: 655 ft 2/23: 735 ft; 3/16: 669f; 4/11: 737 ft, 6/15: 830 feet; 7/18: 810 ft 8/15: 865 ft 9/12: 870 ft, 10/26: 610 feet; 11/30: 655 ft pt with onset of L hip pain to 5/10l 1/9: 715 ft    Time 12    Period Weeks    Status On-going    Target Date 10/17/21      PT LONG TERM GOAL #4   Title Patient will reduce timed up and go to <11 seconds to  reduce fall risk and demonstrate improved transfer/gait ability.    Baseline 12/21: 16.78s, 01/26: 12.08s 2/23: 10 sec, 5/23: 10 sec, 6/15: 8.8 sec    Time 8    Period Weeks    Status Achieved      PT LONG TERM GOAL #5   Title Patient will increase lower extremity functional scale to >60/80 to demonstrate improved functional mobility and increased tolerance with ADLs.    Baseline 12/21: 50/80, 01/26: 62/80, 6/15:61/80    Time 8    Period Weeks    Status Achieved    Target Date 07/06/21      PT LONG TERM GOAL #6   Title Patient will increase Berg Balance score by > 6 points to demonstrate decreased fall risk during functional activities.    Baseline 9/12: 47/56, 10/26: 49/56; 11/30: 49/56; 1/9: 49/56    Time 4    Period Weeks    Status On-going    Target Date 10/17/21                   Plan - 08/29/21 1818     Clinical Impression Statement Session focused on SLB and foot clearance. PT set up multiple obstacles for pt to practice clearing. Pt with initial difficulty clearing obstacles but was able to correct following cuing. The pt was also able to perform multiple reps of cone taps/SLB with intermittnet to no UE support on this date. The pt will continue to benefit from further skilled PT to improve balance, LE strength to increase QOL and decrease fall risk.    Personal Factors and Comorbidities Comorbidity 3+;Time since onset of injury/illness/exacerbation    Comorbidities diabetes, hypertension, hyperlipidemia    Examination-Activity Limitations Squat;Stairs;Transfers    Examination-Participation Restrictions Church    Stability/Clinical Decision Making Evolving/Moderate complexity    Rehab Potential Fair    PT Frequency 2x / week    PT Duration 8 weeks    PT Treatment/Interventions Moist  Heat;Electrical Stimulation;Gait training;Stair training;Functional mobility training;Therapeutic activities;Therapeutic exercise;Balance training;Neuromuscular  re-education;Patient/family education;Manual techniques;Passive range of motion;Energy conservation    PT Next Visit Plan obstacle course, coordination, dual task, endurance, SLB, tandem stance, obstacle negotiation, continue POC as previously indicated    PT Home Exercise Plan 11/30: Access Code: [email or web address]; 1/16: Access Code: [email or web address]; no updates    Consulted and Agree with Plan of Care Patient             Patient will benefit from skilled therapeutic intervention in order to improve the following deficits and impairments:  Abnormal gait, Decreased endurance, Impaired sensation, Decreased activity tolerance, Decreased strength, Decreased balance, Decreased mobility, Difficulty walking, Decreased range of motion, Decreased safety awareness, Improper body mechanics  Visit Diagnosis: Unsteadiness on feet  Other abnormalities of gait and mobility  Muscle weakness (generalized)     Problem List There are no problems to display for this patient.   Zollie Pee, PT 08/29/2021, 6:29 PM  [place] MAIN [facility] SERVICES [address] [place], [place], [address] Phone: [phone]   Fax:  [phone]  Name: Michael Cohen MRN: [id number] Date of Birth: [birth year]

## 2021-08-31 ENCOUNTER — Ambulatory Visit: Payer: Medicare Other

## 2021-09-05 ENCOUNTER — Ambulatory Visit: Payer: Medicare Other

## 2021-09-05 ENCOUNTER — Other Ambulatory Visit: Payer: Self-pay

## 2021-09-05 DIAGNOSIS — R2681 Unsteadiness on feet: Secondary | ICD-10-CM | POA: Diagnosis not present

## 2021-09-05 DIAGNOSIS — M6281 Muscle weakness (generalized): Secondary | ICD-10-CM

## 2021-09-05 DIAGNOSIS — M25551 Pain in right hip: Secondary | ICD-10-CM

## 2021-09-05 DIAGNOSIS — R2689 Other abnormalities of gait and mobility: Secondary | ICD-10-CM

## 2021-09-05 NOTE — Therapy (Signed)
[place] MAIN [facility] SERVICES [address] [place], [place], [address] Phone: [phone]   Fax:  [phone]  Physical Therapy Treatment  Patient Details  Name: Michael Cohen MRN: [id number] Date of Birth: 12/25/1940 No data recorded  Encounter Date: 09/05/2021   PT End of Session - 09/05/21 1648     Visit Number 44    Number of Visits 69    Date for PT Re-Evaluation 10/17/21    Authorization Type Medicare Parts A & B    Authorization Time Period 07/25/2021-10/17/2021    PT Start Time 1349    PT Stop Time 1430    PT Time Calculation (min) 41 min    Equipment Utilized During Treatment Gait belt    Activity Tolerance Patient tolerated treatment well    Behavior During Therapy WFL for tasks assessed/performed             Past Medical History:  Diagnosis Date   Diabetes mellitus without complication ([place])    Hypercholesteremia    Hypertension     Past Surgical History:  Procedure Laterality Date   CATARACT EXTRACTION Bilateral    COLONOSCOPY WITH PROPOFOL N/A 12/12/2017   Procedure: COLONOSCOPY WITH PROPOFOL;  Surgeon: [name], [name], MD;  Location: [facility] ENDOSCOPY;  Service: Gastroenterology;  Laterality: N/A;   EYE SURGERY      There were no vitals filed for this visit.   Subjective Assessment - 09/05/21 1345     Subjective Pt reports some R hip pain and tightness in his RLE. Pt reports taking a shower/heat imporved pain.    Pertinent History 81 y.o. 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 His pain began around a month ago and is located over the proximal lateral thigh and in the right buttocks. He describes his pain as achy more than sharp. He is not sure of any aggravating activities. He reports of having some pain at rest today. He reports associated pain or "tightness" felt along anterior and lateral portions of the lower leg. He was previously evaluated by  [name] in podiatry and diagnosed with tibialis tendinitis. He denies associated groin pain, denies radicular symptoms. He has tried Baclofen without any relief of his symptoms. Of note, the patient states that he was scheduled to go on a cruise this week but was canceled due to the pain he is experiencing. He states that he would be unable to walk around and participate fully in his vacation given his pain. An anteroposterior view of the pelvis and anteroposterior and lateral views of the right hip were obtained. Images reveal mild loss of femoral acetabular joint space with mild osteophyte formation noted. CAM deformity of the right hip noted. No fractures or dislocations. Anteroposterior, oblique, and lateral views of the lumbar spine were obtained. Images reveal no scoliosis. Mild loss of lumbar lordosis noted. Significant loss of disc space noted at L4-L5 and L5-S1. Intervertebral osteophyte formation of L4 and L5. No fractures or wedging noted. Calcification of the abdominal aorta noted. Degeneration of the lumbar facets noted. Patient was recently diagnosed with lumbar radiculoapthy due to symptoms being more consistent vs hip pathology given the absence of hip symptoms with provocative maneuvers. Prednisone tapers have been considered but is on hold for now due to patient's diabetes.    Limitations Walking;Standing;Lifting;Sitting    How long can you sit comfortably? Few hours    How long can you stand comfortably? [place]  mins    How long can you walk comfortably? 1 hour    Currently in Pain? Yes    Pain Onset 1 to 4 weeks ago    Pain Onset In the past 7 days            INTERVENTIONS - Gait belt donned and CGA provided throughout unless otherwise specified  Seated hamstring stretch 1x60 sec B  Seated figure four stretch 1x60 sec B  Standing hip abduction at support bar 2x15 each LE; cueing for modification of range of motion to improve comfort with intervention and for  technique  Standing heel raises 2x15 bilaterally  Standing dorsiflexion 15x bilaterally   Treadmill LE muscle and cardiorespiratory endurance training (HIIT) - pt ambulates up to 2.3 mph in multiple 30 sec bursts, but otherwise maintains speed at 1.5 mph x 6.5 min, HR achieves 88 bpm. Pt continues to exhibit increased BUE weightbearing and reports fatigue through BUEs. Cuing for proximity to handrails, improves step-length, heel-toe sequencing and upright posture. PT provides CGA throughout.   Standing next to support surface One foot on airex and one on floor 2 x 45 to 60 seconds each lower extremity -- Patient progressed to performing intervention with vertical and horizontal head turns each lower extremity  Stepping forward/backward over hurdle x multiple reps. Initially starts with BUE support and able to decrease to finger touch support going backward and no upper extremity support stepping forward.   Alternating toe taps onto first stair step 20x alternating; patient able to perform hands-free but with decreased eccentric control  Pt educated throughout session about proper posture and technique with exercises. Improved exercise technique, movement at target joints, use of target muscles after min to mod verbal, visual, tactile cues.  Note: Portions of this document were prepared using Dragon voice recognition software and although reviewed may contain unintentional dictation errors in syntax, grammar, or spelling.      PT Short Term Goals - 07/25/21 1436       PT SHORT TERM GOAL #1   Title Patient will be independent in home exercise program to improve strength/mobility for better functional independence with ADLs.    Baseline patient is compliant with all his HEP; attending water aerobics on days he's not coming to therapy.    Time 4    Period Weeks    Status Achieved    Target Date 08/03/20      PT SHORT TERM GOAL #2   Title Patient will increase RLE gross strength to 4+/5  as to improve functional strength for independent gait, increased standing tolerance and increased ADL ability.    Baseline 12/21: RLE gross strength 4/5 2/23: see note; 4/11: grossly 5/5 BLEs; 7/18: grossly 5/5 BLEs    Time 4    Period Weeks    Status Achieved    Target Date 08/03/20               PT Long Term Goals - 07/25/21 1436       PT LONG TERM GOAL #1   Title Patient will increase FOTO score to equal to or greater than 67 to demonstrate statistically significant improvement in mobility and quality of life.    Baseline 12/21: 57 2/23: 74%, 5/23: 55%, 6/15: 57%; 7/18: 70% 8/15: 66% (previously achieved); 9/13 69%, 10/26: 61%; 11/30: 59; 1/9: 66    Time 12    Period Weeks    Status Partially Met    Target Date 10/17/21      PT  LONG TERM GOAL #2   Title Patient (> 55 years old) will complete five times sit to stand test in <12 seconds indicating an increased LE strength and improved balance.    Baseline 12/21: 17.65s, 01/26: 14.47s; 3/16: 13.17sec; 4/11: 8.5 sec, 5/23: 15.25 sec, 6/15: 8.25 sec with arms across chest; 9/12: 11.36 sec; 1/9: 10 seconds    Time 8    Period Weeks    Status Achieved      PT LONG TERM GOAL #3   Title Patient will increase six minute walk test distance to >1000 for progression to community ambulator and improve gait ability    Baseline 12/21: 500, 01/26: 655 ft 2/23: 735 ft; 3/16: 631f; 4/11: 737 ft, 6/15: 830 feet; 7/18: 810 ft 8/15: 865 ft 9/12: 870 ft, 10/26: 610 feet; 11/30: 655 ft pt with onset of L hip pain to 5/10l 1/9: 715 ft    Time 12    Period Weeks    Status On-going    Target Date 10/17/21      PT LONG TERM GOAL #4   Title Patient will reduce timed up and go to <11 seconds to reduce fall risk and demonstrate improved transfer/gait ability.    Baseline 12/21: 16.78s, 01/26: 12.08s 2/23: 10 sec, 5/23: 10 sec, 6/15: 8.8 sec    Time 8    Period Weeks    Status Achieved      PT LONG TERM GOAL #5   Title Patient will increase  lower extremity functional scale to >60/80 to demonstrate improved functional mobility and increased tolerance with ADLs.    Baseline 12/21: 50/80, 01/26: 62/80, 6/15:61/80    Time 8    Period Weeks    Status Achieved    Target Date 07/06/21      PT LONG TERM GOAL #6   Title Patient will increase Berg Balance score by > 6 points to demonstrate decreased fall risk during functional activities.    Baseline 9/12: 47/56, 10/26: 49/56; 11/30: 49/56; 1/9: 49/56    Time 4    Period Weeks    Status On-going    Target Date 10/17/21                   Plan - 09/05/21 1440     Clinical Impression Statement Pt able to progress to HIIT on treadmill reaching max speed of 2.3 mph for 30 sec bouts (HR reaching no greater than 88 bpm). Upon start of session pt had reported some LLE discomfort/tightness that he reported improved following endurance and strengthening interventions. Whlie pt did show improvement in session, pt did reqiure encouragement to challenge himself with SLB progressions (stepping over hurdle). SLB remains a primary impairment to be improved. The pt will continue to benefit from further skilled PT to improve LE endurance, strength, gait and balance.    Personal Factors and Comorbidities Comorbidity 3+;Time since onset of injury/illness/exacerbation    Comorbidities diabetes, hypertension, hyperlipidemia    Examination-Activity Limitations Squat;Stairs;Transfers    Examination-Participation Restrictions Church    Stability/Clinical Decision Making Evolving/Moderate complexity    Rehab Potential Fair    PT Frequency 2x / week    PT Duration 8 weeks    PT Treatment/Interventions Moist Heat;Electrical Stimulation;Gait training;Stair training;Functional mobility training;Therapeutic activities;Therapeutic exercise;Balance training;Neuromuscular re-education;Patient/family education;Manual techniques;Passive range of motion;Energy conservation    PT Next Visit Plan obstacle course,  coordination, dual task, endurance, SLB, tandem stance, obstacle negotiation, continue POC as previously indicated    PT Home Exercise Plan 11/30:  Access Code: [email or web address]; 1/16: Access Code: [email or web address]; no updates    Consulted and Agree with Plan of Care Patient             Patient will benefit from skilled therapeutic intervention in order to improve the following deficits and impairments:  Abnormal gait, Decreased endurance, Impaired sensation, Decreased activity tolerance, Decreased strength, Decreased balance, Decreased mobility, Difficulty walking, Decreased range of motion, Decreased safety awareness, Improper body mechanics  Visit Diagnosis: Pain in right hip  Other abnormalities of gait and mobility  Muscle weakness (generalized)  Unsteadiness on feet     Problem List There are no problems to display for this patient.   Zollie Pee, PT 09/05/2021, 4:50 PM  [place] MAIN [facility] SERVICES [address] [place], [place], [address] Phone: [phone]   Fax:  [phone]  Name: Michael Cohen MRN: [id number] Date of Birth: 12-19-1940

## 2021-09-07 ENCOUNTER — Ambulatory Visit: Payer: Medicare Other

## 2021-09-12 ENCOUNTER — Ambulatory Visit: Payer: Medicare Other

## 2021-09-14 ENCOUNTER — Ambulatory Visit: Payer: Medicare Other

## 2021-09-19 ENCOUNTER — Other Ambulatory Visit: Payer: Self-pay | Admitting: Orthopedic Surgery

## 2021-09-19 ENCOUNTER — Ambulatory Visit: Payer: Medicare Other

## 2021-09-19 DIAGNOSIS — M5416 Radiculopathy, lumbar region: Secondary | ICD-10-CM

## 2021-09-21 ENCOUNTER — Ambulatory Visit: Payer: Medicare Other

## 2021-09-26 ENCOUNTER — Other Ambulatory Visit: Payer: Self-pay

## 2021-09-26 ENCOUNTER — Ambulatory Visit: Payer: Medicare Other | Attending: Podiatry

## 2021-09-26 DIAGNOSIS — M25551 Pain in right hip: Secondary | ICD-10-CM | POA: Insufficient documentation

## 2021-09-26 DIAGNOSIS — M6281 Muscle weakness (generalized): Secondary | ICD-10-CM | POA: Diagnosis present

## 2021-09-26 DIAGNOSIS — R2689 Other abnormalities of gait and mobility: Secondary | ICD-10-CM | POA: Diagnosis present

## 2021-09-26 DIAGNOSIS — R2681 Unsteadiness on feet: Secondary | ICD-10-CM | POA: Diagnosis present

## 2021-09-26 DIAGNOSIS — R278 Other lack of coordination: Secondary | ICD-10-CM | POA: Insufficient documentation

## 2021-09-26 NOTE — Therapy (Incomplete)
[place] MAIN [facility] SERVICES [address] [place], [place], [address] Phone: [phone]   Fax:  [phone]  Physical Therapy Treatment/Physical Therapy Progress Note/DISCHARGE NOTE   Dates of reporting period  06/15/2022   to   09/26/2021   Patient Details  Name: Michael Cohen MRN: [id number] Date of Birth: 01/07/1941 No data recorded  Encounter Date: 09/26/2021    Past Medical History:  Diagnosis Date   Diabetes mellitus without complication ([place])    Hypercholesteremia    Hypertension     Past Surgical History:  Procedure Laterality Date   CATARACT EXTRACTION Bilateral    COLONOSCOPY WITH PROPOFOL N/A 12/12/2017   Procedure: COLONOSCOPY WITH PROPOFOL;  Surgeon: [name], [name], MD;  Location: [facility] ENDOSCOPY;  Service: Gastroenterology;  Laterality: N/A;   EYE SURGERY      There were no vitals filed for this visit.  INTERVENTIONS - goals reassessed for progress note.   FOTO: 65   5xSTS: 14.8 sec (previously achieved, was 10 sec on 1/9)  6MWT - 605 ft   BERG: 49/56 (unchanged)  TUG:  15.16 sec (previously achieved, was 8.8 sec 12/29/20)  STS: 2x10   Semi-tandem 20 sec each LE; intermittent support    Pt educated throughout session about proper posture and technique with exercises. Improved exercise technique, movement at target joints, use of target muscles after min to mod verbal, visual, tactile cues.   Pt disagrees with PT's plan for discharge. (Original referral for his foot). PT explained to pt that pt has plateau regarding original referral and that he will need new order in order to address his new pain and difficulty with gait.      PT Short Term Goals - 07/25/21 1436       PT SHORT TERM GOAL #1   Title Patient will be independent in home exercise program to improve strength/mobility for better functional independence with ADLs.    Baseline patient is compliant with all his HEP; attending water  aerobics on days he's not coming to therapy.    Time 4    Period Weeks    Status Achieved    Target Date 08/03/20      PT SHORT TERM GOAL #2   Title Patient will increase RLE gross strength to 4+/5 as to improve functional strength for independent gait, increased standing tolerance and increased ADL ability.    Baseline 12/21: RLE gross strength 4/5 2/23: see note; 4/11: grossly 5/5 BLEs; 7/18: grossly 5/5 BLEs    Time 4    Period Weeks    Status Achieved    Target Date 08/03/20               PT Long Term Goals - 07/25/21 1436       PT LONG TERM GOAL #1   Title Patient will increase FOTO score to equal to or greater than 67 to demonstrate statistically significant improvement in mobility and quality of life.    Baseline 12/21: 57 2/23: 74%, 5/23: 55%, 6/15: 57%; 7/18: 70% 8/15: 66% (previously achieved); 9/13 69%, 10/26: 61%; 11/30: 59; 1/9: 66    Time 12    Period Weeks    Status Partially Met    Target Date 10/17/21      PT LONG TERM GOAL #2   Title Patient (> 6 years old) will complete five times sit to stand test in <12 seconds indicating an increased LE strength and improved balance.    Baseline 12/21: 17.65s, 01/26:  14.47s; 3/16: 13.17sec; 4/11: 8.5 sec, 5/23: 15.25 sec, 6/15: 8.25 sec with arms across chest; 9/12: 11.36 sec; 1/9: 10 seconds    Time 8    Period Weeks    Status Achieved      PT LONG TERM GOAL #3   Title Patient will increase six minute walk test distance to >1000 for progression to community ambulator and improve gait ability    Baseline 12/21: 500, 01/26: 655 ft 2/23: 735 ft; 3/16: 666f; 4/11: 737 ft, 6/15: 830 feet; 7/18: 810 ft 8/15: 865 ft 9/12: 870 ft, 10/26: 610 feet; 11/30: 655 ft pt with onset of L hip pain to 5/10l 1/9: 715 ft    Time 12    Period Weeks    Status On-going    Target Date 10/17/21      PT LONG TERM GOAL #4   Title Patient will reduce timed up and go to <11 seconds to reduce fall risk and demonstrate improved transfer/gait  ability.    Baseline 12/21: 16.78s, 01/26: 12.08s 2/23: 10 sec, 5/23: 10 sec, 6/15: 8.8 sec    Time 8    Period Weeks    Status Achieved      PT LONG TERM GOAL #5   Title Patient will increase lower extremity functional scale to >60/80 to demonstrate improved functional mobility and increased tolerance with ADLs.    Baseline 12/21: 50/80, 01/26: 62/80, 6/15:61/80    Time 8    Period Weeks    Status Achieved    Target Date 07/06/21      PT LONG TERM GOAL #6   Title Patient will increase Berg Balance score by > 6 points to demonstrate decreased fall risk during functional activities.    Baseline 9/12: 47/56, 10/26: 49/56; 11/30: 49/56; 1/9: 49/56    Time 4    Period Weeks    Status On-going    Target Date 10/17/21                    Patient will benefit from skilled therapeutic intervention in order to improve the following deficits and impairments:     Visit Diagnosis: No diagnosis found.     Problem List There are no problems to display for this patient.   HZollie Pee PT 09/26/2021, 1:55 PM  [place] [facility] [address] [address]Phone: [phone]  Fax:  [phone] Name: WHarold MoncusMRN: [id number] of Birth: 8October 28, 1942

## 2021-09-27 ENCOUNTER — Ambulatory Visit
Admission: RE | Admit: 2021-09-27 | Discharge: 2021-09-27 | Disposition: A | Discharge status: Home / Self Care | Payer: Medicare Other | Source: Ambulatory Visit | Attending: Orthopedic Surgery | Admitting: Orthopedic Surgery

## 2021-09-27 ENCOUNTER — Ambulatory Visit: Payer: Medicare Other

## 2021-09-27 DIAGNOSIS — M5416 Radiculopathy, lumbar region: Secondary | ICD-10-CM | POA: Insufficient documentation

## 2021-09-27 IMAGING — MR MR LUMBAR SPINE W/O CM
5 series · 31 of 48 positions shown · non-contrast
Comparison: None.

CLINICAL DATA: Right hip pain radiating down the leg and into the
foot over the last month

EXAM:
MRI LUMBAR SPINE WITHOUT CONTRAST
TECHNIQUE: Multiplanar, multisequence MR imaging of the lumbar spine was
performed. No intravenous contrast was administered.

[Series 5: T2 · sagittal · 4.0mm · 0.81mm/px · 6 of 17 slices shown (1 of 2)]
[im 1/17]
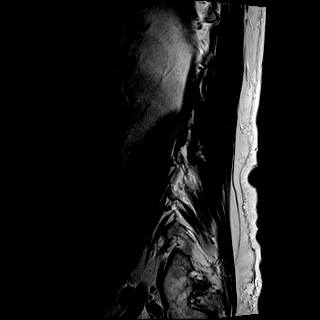
[im 4/17]
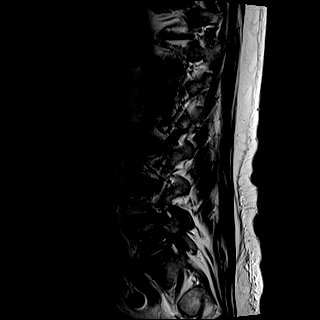
[im 7/17]
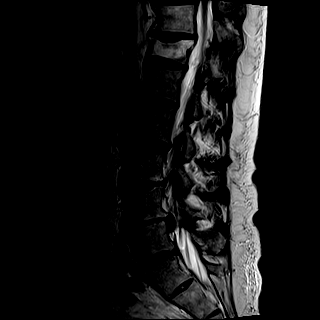
[im 10/17]
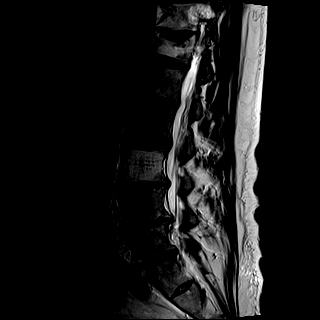
[im 13/17]
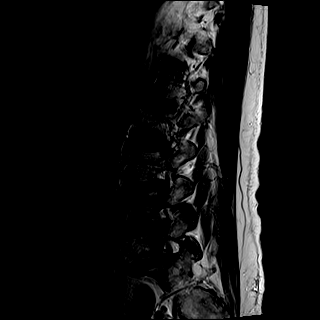
[im 17/17]
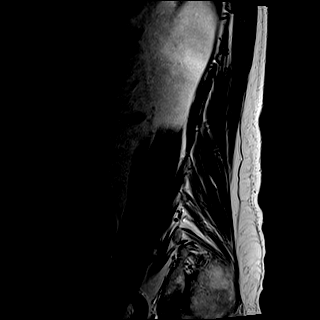

[Series 6: T1 · sagittal · 4.0mm · 0.81mm/px · 6 of 17 slices shown (1 of 2)]
[im 1/17]
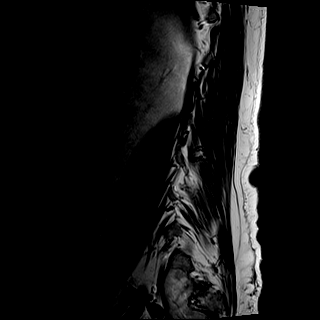
[im 4/17]
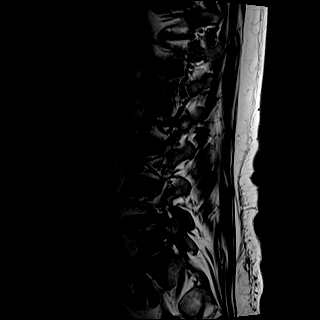
[im 7/17]
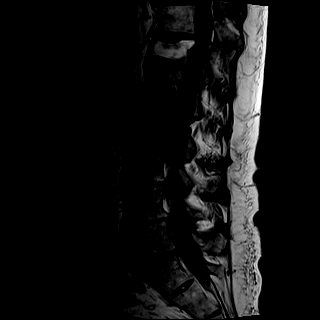
[im 10/17]
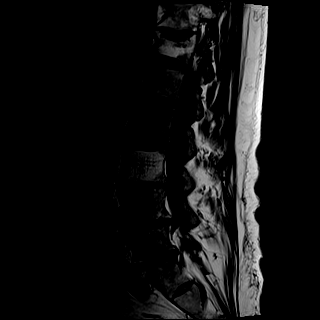
[im 13/17]
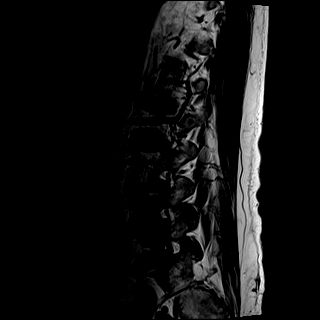
[im 17/17]
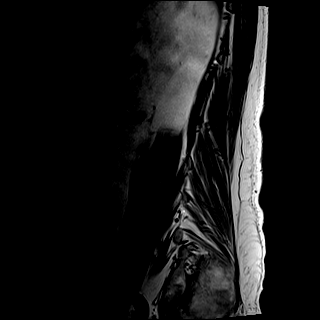

[Series 7: STIR · sagittal · 4.0mm · 0.41mm/px · 1 of 17 slices shown]
[im 1/17]
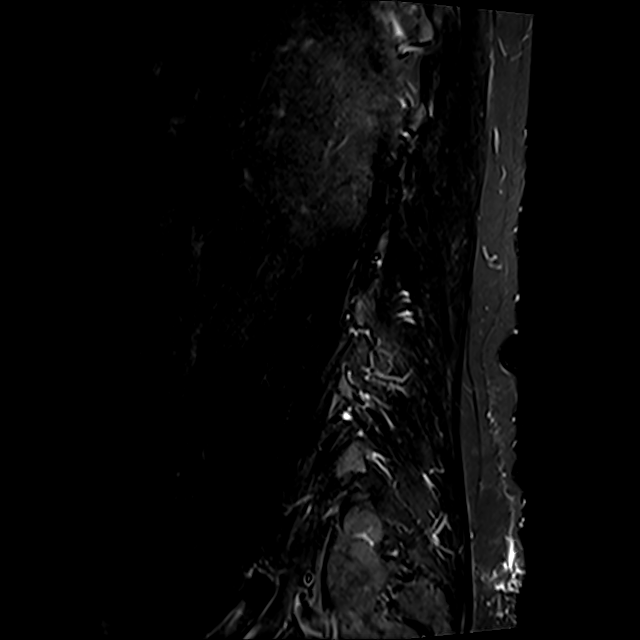

[Series 8: T2 · axial · 4.0mm · 0.78mm/px · z∈[-102,+112]mm · 9 of 39 slices shown (2 of 2)]
[im 1/39]
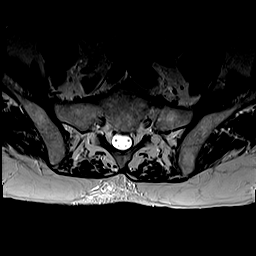
[im 6/39]
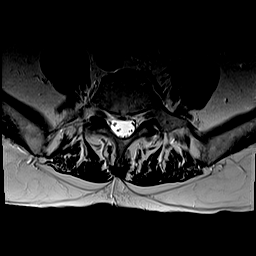
[im 11/39]
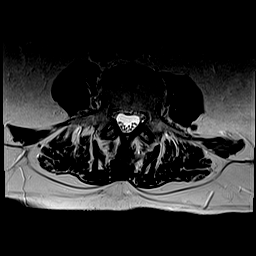
[im 17/39]
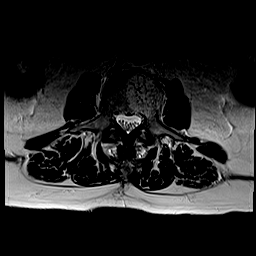
[im 20/39]
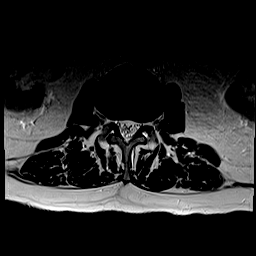
[im 22/39]
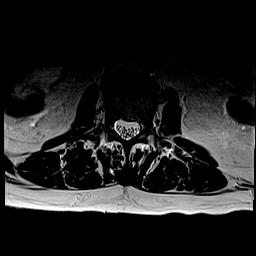
[im 28/39]
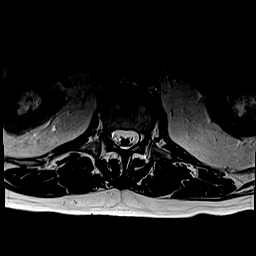
[im 33/39]
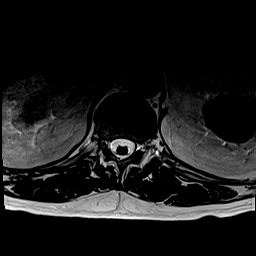
[im 39/39]
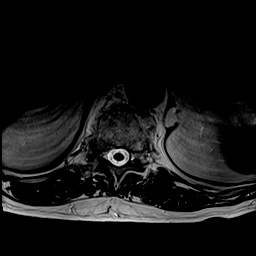

[Series 9: T1 · axial · 4.0mm · 0.39mm/px · z∈[-102,+112]mm · 9 of 39 slices shown (2 of 2)]
[im 1/39]
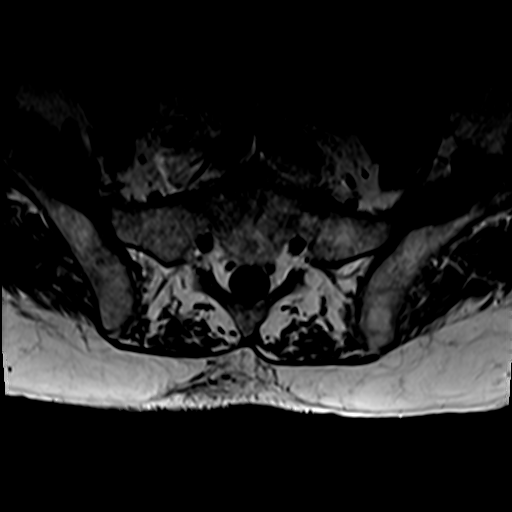
[im 6/39]
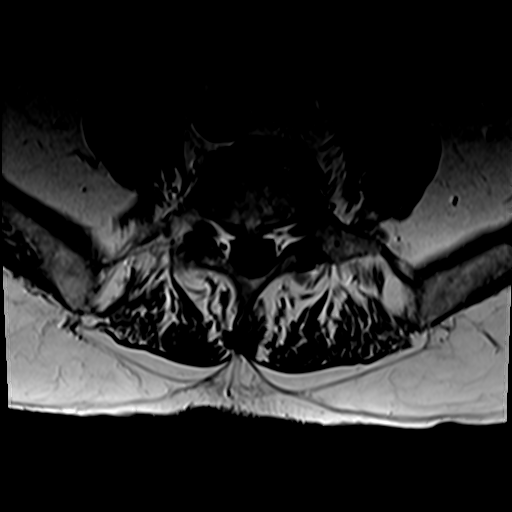
[im 11/39]
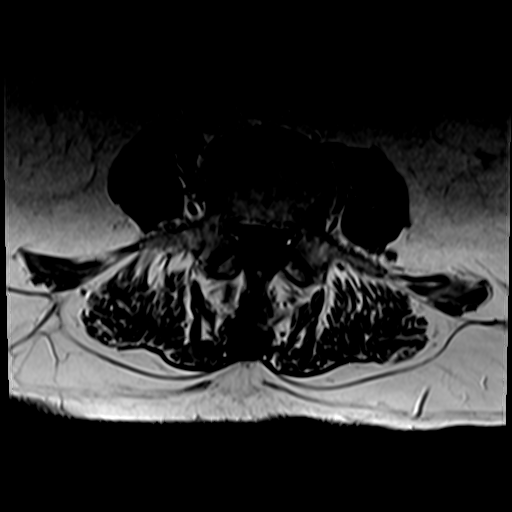
[im 17/39]
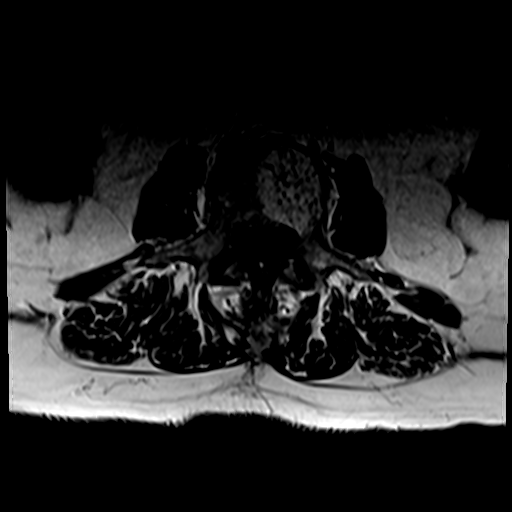
[im 20/39]
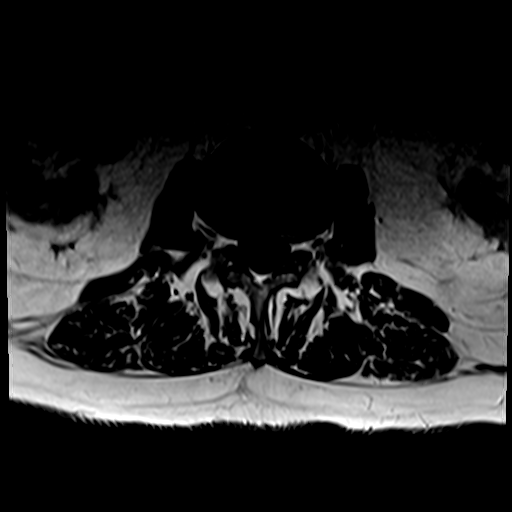
[im 22/39]
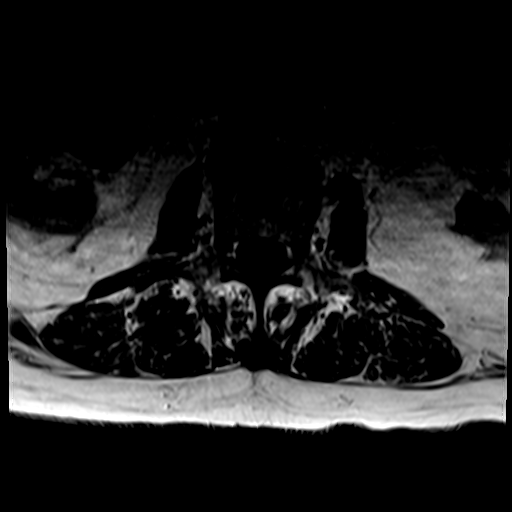
[im 28/39]
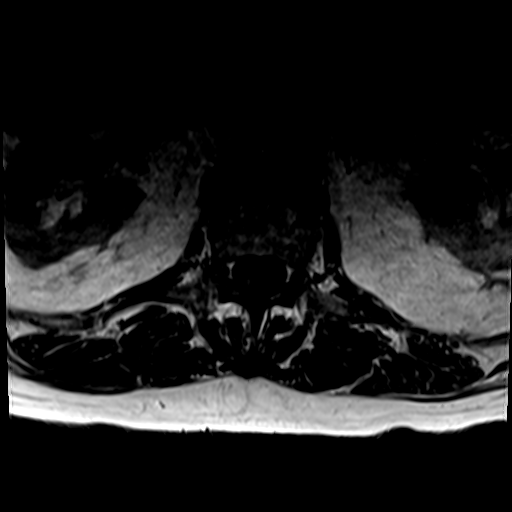
[im 33/39]
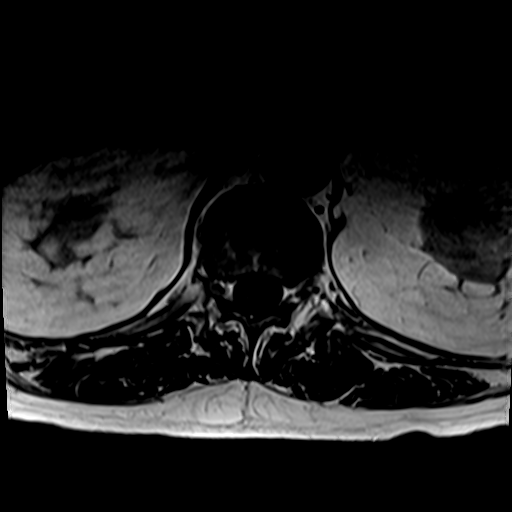
[im 39/39]
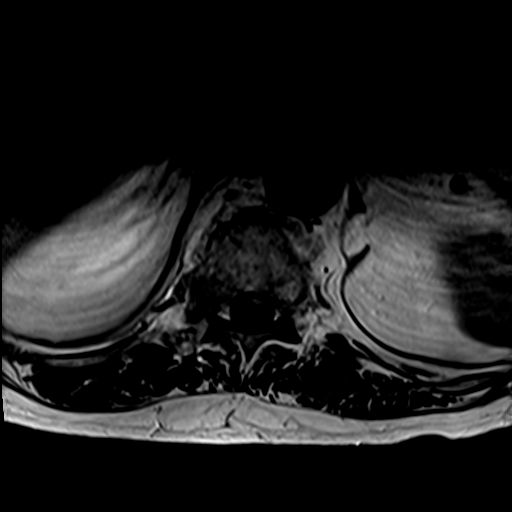

[31 of 48 positions shown; findings below may reference images not displayed]

FINDINGS: Segmentation: The lowest lumbar type non-rib-bearing vertebra is
labeled as L5.

Alignment:  No vertebral subluxation is observed.

Vertebrae: Loss of disc height at all lumbar levels between L2 and
S1 with prominent loss of intervertebral disc height at L4-5 and
L5-S1 with mild associated degenerative endplate findings. Large
hemangioma eccentric to the left in the L3 vertebral body. Chronic
50% compression fracture at T12 with 4 mm of associated posterior
bony retropulsion. No significant degree of vertebral edema.

Conus medullaris and cauda equina: Conus extends to the upper L2
level. Conus and cauda equina appear normal.

Paraspinal and other soft tissues: Unremarkable

Disc levels:

T11-12: No impingement.  Posterior bony retropulsion noted.

T12-L1: No impingement.  Mild disc bulge.

L1-2: Unremarkable.

L2-3: Borderline bilateral subarticular lateral recess stenosis due
to disc bulge and facet arthropathy.

L3-4: Borderline bilateral foraminal stenosis due to disc bulge,
small central disc protrusion, intervertebral spurring, mild facet
arthropathy.

L4-5: Prominent right subarticular lateral recess stenosis and mild
bilateral foraminal stenosis due to right lateral recess disc
extrusion versus disc fragment, intervertebral spurring, and facet
spurring.

L5-S1: Mild right and borderline left foraminal stenosis due to
facet and intervertebral spurring.
IMPRESSION: 1. Lumbar spondylosis and degenerative disc disease cause prominent
impingement at L4-5 and mild impingement at L5-S1. The dominant
finding is the L4-5 right lateral recess disc extrusion versus disc
fragment exerting mass effect on the right L5 nerves.
2. Chronic 50% compression fracture T12 with 4 mm of chronic bony
retropulsion but no impingement at this level.

## 2021-09-28 ENCOUNTER — Ambulatory Visit: Payer: Medicare Other

## 2021-10-03 ENCOUNTER — Ambulatory Visit: Payer: Medicare Other

## 2021-10-03 ENCOUNTER — Other Ambulatory Visit: Payer: Self-pay

## 2021-10-03 DIAGNOSIS — M25551 Pain in right hip: Secondary | ICD-10-CM

## 2021-10-03 DIAGNOSIS — R2689 Other abnormalities of gait and mobility: Secondary | ICD-10-CM

## 2021-10-03 DIAGNOSIS — R2681 Unsteadiness on feet: Secondary | ICD-10-CM

## 2021-10-03 DIAGNOSIS — M6281 Muscle weakness (generalized): Secondary | ICD-10-CM

## 2021-10-03 DIAGNOSIS — R278 Other lack of coordination: Secondary | ICD-10-CM

## 2021-10-03 NOTE — Therapy (Signed)
[place] ?[place] [facility] RE[facility] SERVICES ?[place], [place], [address] ?Phone: [phone]   Fax:  [phone] ? ?Physical Therapy Evaluation ? ?Patient Details  ?Name: Michael Cohen ?MRN: [id number] ?Date of Birth: June 22, 1941 ?Referring Provider (PT): Kirk Ruths, MD ? ? ?Encounter Date: 10/03/2021 ? ? PT End of Session - 10/04/21 0849   ? ? Visit Number 1   ? Number of Visits 25   ? Date for PT Re-Evaluation 12/26/21   ? PT Start Time 1347   ? PT Stop Time 1430   ? PT Time Calculation (min) 43 min   ? Equipment Utilized During Treatment Gait belt   ? Activity Tolerance Patient tolerated treatment well   ? Behavior During Therapy [facility] for tasks assessed/performed   ? ?  ?  ? ?  ? ? ?Past Medical History:  ?Diagnosis Date  ? Diabetes mellitus without complication ([place])   ? Hypercholesteremia   ? Hypertension   ? ? ?Past Surgical History:  ?Procedure Laterality Date  ? CATARACT EXTRACTION Bilateral   ? COLONOSCOPY WITH PROPOFOL N/A 12/12/2017  ? Procedure: COLONOSCOPY WITH PROPOFOL;  Surgeon: [name], [name], MD;  Location: [facility] ENDOSCOPY;  Service: Gastroenterology;  Laterality: N/A;  ? EYE SURGERY    ? ? ?There were no vitals filed for this visit. ? ? ? Subjective Assessment - 10/03/21 1350   ? ? Subjective Pt is a 81 yo male who presents to PT with new referral for gait abnormality. Pt known to PT. Pt with c/o recurrence of R hip pain, in addition to R low back pain that radiates down to foot (pt originally seen in clinic for R hip pain, foot). He has noticed worsening "limp." Pt noted with increased antalgic gait pattern since recurrence of pain. Per pt, new onset started approximatley a month and a half ago. Pt with recent MRI on 09/27/21 that indicates ?prominent impingement at L4-5 and mild impingement L5-S1" (see below for details). Pt currently taking advil for pain management. Pt has ?injection? for low back scheduled for the 31st of this month. Pt reports  his pain fluctuates in intensity but is worst in the morning. Pain can also increase pt bending forward. He rates current pain as 3-4/10, worst pain as 8/10 an best pain as 2-3/10. 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   ? Pertinent History Pt is a 81 yo male who presents to PT with new referral for gait abnormality. Pt known to PT. Pt with c/o recurrence of R hip pain, in addition to R low back pain that radiates down to foot (pt originally seen in clinic for R hip pain, foot). He has noticed worsening "limp." Pt noted with increased antalgic gait pattern since recurrence of pain. Per pt, new onset started approximatley a month and a half ago. Pt with recent MRI on 09/27/21 that indicates ?prominent impingement at L4-5 and mild impingement L5-S1" (see below for details). Pt currently taking advil for pain management. Pt has ?injection? for low back scheduled for the 31st of this month. Pt reports his pain fluctuates in intensity but is worst in the morning. Pain can also increase pt bending forward. He rates current pain as 3-4/10, worst pain as 8/10 an best pain as 2-3/10. 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  PMH is significant for DMII, hypercholesterimia, HLD, HTN, neuropathy.   ? Limitations Walking;House hold activities;Standing   ? How long can you stand comfortably? Limited first thing in morning due to increased R LBP/hip pain that radiates down RLE   ? How long can you walk comfortably? Pain onset first thing in morning, limited immediately, limited generally with long distances   ? Diagnostic tests 09/27/21: MR lumbar spine ?IMPRESSION:   1. Lumbar spondylosis and degenerative disc disease cause prominent   impingement at L4-5 and mild impingement at L5-S1. The dominant   finding is the L4-5 right lateral recess  disc extrusion versus disc   fragment exerting mass effect on the right L5 nerves.   2. 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  "MR Hip R WO contrast 12/01/20: "IMPRESSION:  1. Mild symmetric degenerative spurring of both femoral heads and  acetabula. A specific cause for the patient's recent hip snap and  hip pain is not otherwise identified.  2. Sigmoid colon diverticulosis.";   ? Patient Stated Goals decrease pain, improve walking   ? Currently in Pain? Yes   ? Pain Score 4    ? Pain Location Hip   ? Pain Orientation Right   ? Pain Descriptors / Indicators Aching;Radiating   ? Pain Type --   pain onset within past 6 weeks  ? Pain Radiating Towards down R hip, leg, and into R foot   ? Pain Onset More than a month ago   ? Pain Frequency Constant   ? Aggravating Factors  first thing in the morning, "sleep on it over night," with walking, bending forward   ? Pain Relieving Factors advil, heat   ? Effect of Pain on Daily Activities decreased ability to ambulate, decreased functional mobility   ? Multiple Pain Sites Yes   ? Pain Score 4   ? Pain Location Back   ? Pain Orientation Right;Lower   ? Pain Descriptors / Indicators Radiating   ? Pain Onset More than a month ago   ? Pain Score 4   ? Pain Location Leg   ? Pain Orientation Right   ? Pain Descriptors / Indicators Radiating   ? ?  ?  ? ?  ? ?EXAMINATION: ? ?PAIN: ?Current: 3-4/10 (pt reports taking two Advil prior to session) ?Worst: 8/10 ?Best: 2-3/10  ?Pt reports aching pain. Pt points to R low back and R hip to indicate origin of pain, reports it radiates all the way to foot.  ? ?Helps pain: Advil improves pain, heat ? ?What makes it worse: sleeping on it over night, walking increases his pain, bending forward  ? ? ?POSTURE: ?Rounded shoulders, forward head, decreased lordotic curves, thoracic kyphosis ? ?Muscle length: ?Hamstring length testing: ?R and L - both lacking >45 degrees from  full extension. ? ? ?PROM/AROM: ? ?Thoracic and lumbar spine ?Impaired flexion (notable tightness in hamstrings, also reported)  ?Extension is impaired reports R sided hip/low back pain with spinal extension. ?Lateral flexion impaired to R side ?Rotation pain-free, WFL ? ?STRENGTH:  Graded on a 0-5 scale ?Muscle Group Left Right  ? ?RLE is painful with testing, grossly 4/5 throughout ?LLE grossly 4+/5 ? ?SENSATION: ?Impaired in BLEs and feet due to neuropathy. Impairment more notable in ankles and feet. ? ?Pt reports he feels his neuropathy has worsened, stating "it feels fuzzier" when referring to BLEs/feet.  ? ?FUNCTIONAL MOBILITY: antalgic gait multiple  impairments seen in gait mechanics ? ? ?BALANCE: impaired see below ? ? ?GAIT: impaired, see 10MWT for details ?Midfoot strike B, decreased arm swing, decreased step-length, flexed posture throughout gait cycle and antalgic, left lateral lean, pt reports pain with gait, flexed knees, flexed hip (decreased hip extension) ? ?OUTCOME MEASURES: ?TEST Outcome Interpretation  ?5 times sit<>stand 13.5 sec >60 yo, >15 sec indicates increased risk for falls  ?10 meter walk test      0.64     m/s <1.0 m/s indicates increased risk for falls; limited community ambulator ?  ?6 minute walk test deferred 1000 feet is community ambulator  ?Berg Balance Assessment deferred <36/56 (100% risk for falls), 37-45 (80% risk for falls); 46-51 (>50% risk for falls); 52-55 (lower risk <25% of falls)  ? ?FOTO: 61 (goal of 62) ? ?SLB: 0-2 sec B ? ? [facility] PT Assessment - 10/04/21 0001   ? ?  ? Assessment  ? Medical Diagnosis Other abnormalities of gait and mobility   ? Referring Provider (PT) Kirk Ruths, MD   ? Onset Date/Surgical Date --   recurrence of pain 1.5 months ago per pt  ? Next MD Visit upcoming visit on 31st   ? Prior Therapy Yes, known to PT   ?  ? Precautions  ? Precautions Fall   ?  ? Restrictions  ? Weight Bearing Restrictions No   ?  ? Balance Screen  ? Has the  patient fallen in the past 6 months Yes   ? How many times? 1   1 fall when taking his dog out on uneven/sloped surface  ?  ? Home Environment  ? Living Environment Other (Comment)   [place]

## 2021-10-05 ENCOUNTER — Ambulatory Visit: Payer: Medicare Other

## 2021-10-10 ENCOUNTER — Ambulatory Visit: Payer: Medicare Other

## 2021-10-10 ENCOUNTER — Other Ambulatory Visit: Payer: Self-pay

## 2021-10-10 DIAGNOSIS — M25551 Pain in right hip: Secondary | ICD-10-CM

## 2021-10-10 DIAGNOSIS — R2689 Other abnormalities of gait and mobility: Secondary | ICD-10-CM

## 2021-10-10 DIAGNOSIS — M6281 Muscle weakness (generalized): Secondary | ICD-10-CM

## 2021-10-10 DIAGNOSIS — R2681 Unsteadiness on feet: Secondary | ICD-10-CM

## 2021-10-10 NOTE — Therapy (Signed)
?[facility] ?[place], [place], [address] ?Phone: [phone]   Fax:  [phone] ? ?Physical Therapy Treatment ? ?Patient Details  ?Name: Michael Cohen ?MRN: [id number] ?Date of Birth: 1941/07/01 ?Referring Provider (PT): Kirk Ruths, MD ? ? ?Encounter Date: 10/10/2021 ? ? PT End of Session - 10/10/21 1454   ? ? Visit Number 2   ? Number of Visits 25   ? Date for PT Re-Evaluation 12/26/21   ? PT Start Time 3154   ? PT Stop Time 1430   ? PT Time Calculation (min) 41 min   ? Equipment Utilized During Treatment Gait belt   ? Activity Tolerance Patient tolerated treatment well   ? Behavior During Therapy [facility] for tasks assessed/performed   ? ?  ?  ? ?  ? ? ?Past Medical History:  ?Diagnosis Date  ? Diabetes mellitus without complication ([place])   ? Hypercholesteremia   ? Hypertension   ? ? ?Past Surgical History:  ?Procedure Laterality Date  ? CATARACT EXTRACTION Bilateral   ? COLONOSCOPY WITH PROPOFOL N/A 12/12/2017  ? Procedure: COLONOSCOPY WITH PROPOFOL;  Surgeon: [name], [name], MD;  Location: [facility] ENDOSCOPY;  Service: Gastroenterology;  Laterality: N/A;  ? EYE SURGERY    ? ? ?There were no vitals filed for this visit. ? ? Subjective Assessment - 10/10/21 1348   ? ? Subjective Pt reports he took two advil prior to appointment. Pt reports no stumbles/falls. The pt reports no other changes.   ? Pertinent History Pt is a 81 yo male who presents to PT with new referral for gait abnormality. Pt known to PT. Pt with c/o recurrence of R hip pain, in addition to R low back pain that radiates down to foot (pt originally seen in clinic for R hip pain, foot). He has noticed worsening "limp." Pt noted with increased antalgic gait pattern since recurrence of pain. Per pt, new onset started approximatley a month and a half ago. Pt with recent MRI on 09/27/21 that indicates ?prominent impingement at L4-5 and mild impingement L5-S1" (see below for  details). Pt currently taking advil for pain management. Pt has ?injection? for low back scheduled for the 31st of this month. Pt reports his pain fluctuates in intensity but is worst in the morning. Pain can also increase pt bending forward. He rates current pain as 3-4/10, worst pain as 8/10 an best pain as 2-3/10. 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 PMH is significant for DMII, hypercholesterimia, HLD, HTN, neuropathy.   ? Limitations Walking;House hold activities;Standing   ? How long can you sit comfortably? Few hours   ? How long can you stand comfortably? Limited first thing in morning due to increased R LBP/hip pain that radiates down RLE   ? How long can you walk comfortably? Pain onset first thing in morning, limited immediately, limited generally with long distances   ? Diagnostic tests 09/27/21: MR lumbar spine ?IMPRESSION:   1. Lumbar spondylosis and degenerative disc disease cause prominent   impingement at L4-5 and mild impingement at L5-S1. The dominant   finding is the L4-5 right lateral recess disc extrusion versus disc   fragment exerting mass effect on the right L5 nerves.   2. 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  "MR Hip R  WO contrast 12/01/20: "IMPRESSION:  1. Mild symmetric degenerative spurring of both femoral heads and  acetabula. A specific cause for the patient's recent hip snap and  hip pain is not otherwise identified.  2. Sigmoid colon diverticulosis.";   ? Patient Stated Goals decrease pain, improve walking   ? Currently in Pain? Yes   ? Pain Location Hip   ? Pain Orientation Right   ? Pain Onset More than a month ago   ? Pain Onset More than a month ago   ? ?  ?  ? ?  ? ? ? ? ? [facility] PT Assessment - 10/10/21 0001   ? ?  ? Berg Balance Test  ? Sit to Stand Able to stand without using hands and stabilize  independently   ? Standing Unsupported Able to stand safely 2 minutes   ? Sitting with Back Unsupported but Feet Supported on Floor or Stool Able to sit safely and securely 2 minutes   ? Stand to Sit Sits safely with minimal use of hands   ? Transfers Able to transfer safely, minor use of hands   ? Standing Unsupported with Eyes Closed Able to stand 10 seconds safely   ? Standing Unsupported with Feet Together Able to place feet together independently and stand 1 minute safely   ? From Standing, Reach Forward with Outstretched Arm Can reach confidently >25 cm (10")   ? From Standing Position, Pick up Object from [place] to pick up shoe safely and easily   ? From Standing Position, Turn to Look Behind Over each Shoulder Looks behind from both sides and weight shifts well   ? Turn 360 Degrees Able to turn 360 degrees safely one side only in 4 seconds or less   ? Standing Unsupported, Alternately Place Feet on Step/Stool Able to stand independently and safely and complete 8 steps in 20 seconds   ? Standing Unsupported, One Foot in Front Needs help to step but can hold 15 seconds   ? Standing on One Leg Unable to try or needs assist to prevent fall   ? Total Score 48   ? ?  ?  ? ?  ?INTERVENTIONS- ? ?[place]- ? ?Seated hamstring stretch: 2x30 sec each LE  ?Piriformis stretch 30 sec  x 2 sets each LE ? ?ROM: ?Hip ext. -  ?L 7 deg. ?R 5 deg.  ? ? ?2.5# weights donned- ?Standing hip abduction 1x10, 1x12 each LE ?Standing hip ext. 2x10 B ? ?P.ball rollouts forward/backwards, diagonally with 2-3 sec hold at end range, instructed to perform into pain-free stretch. Pt performs multiple reps of each. ? ?STS 2x12; pt rates medium, reports feeling weak, fatigued ? ?Hooklying on plinth: ?LTRs 20x ?Knee to chest 4x each LE ?Green pball hamstring curls 20x ?SLR 10x 2 sets each LE ?Supine piriformis stretch 2x30 sec each LE  ? ? ?NMR: ?BERG: 48/56  ? ? ?PT instructed pt in HEP- ?Access Code: [email or web address] ?URL:  [email or web address] ?Date: 10/10/2021 ?Prepared by: Ricard Dillon ? ?Exercises ?- Sit to Stand with Counter Support  - 1 x daily - 5 x weekly - 3 sets - 10 reps ? ? ? ? PT Education - 10/10/21 1453   ? ? Education Details exercise technique, body mechanics,  HEP   ? Person(s) Educated Patient   ? Methods Explanation;Demonstration;Verbal cues;Tactile cues;Handout   ? Comprehension Verbalized understanding;Returned demonstration;Need further instruction;Verbal cues required   ? ?  ?  ? ?  ? ? ? PT Short  Term Goals - 10/03/21 1719   ? ?  ? PT SHORT TERM GOAL #1  ? Title Patient will be independent in home exercise program to improve strength/mobility for better functional independence with ADLs.   ? Baseline 3/20: to be issued next 1-2 visits   ? Time 6   ? Period Weeks   ? Status New   ? Target Date 11/14/21   ?  ? PT SHORT TERM GOAL #2  ? Title Pt will report a worst pain of no greater than 3/10 within past two weeks to indicate improved QOL   ? Baseline 3/20: 8/10   ? Time 6   ? Period Weeks   ? Status New   ? Target Date 11/14/21   ? ?  ?  ? ?  ? ? ? ? PT Long Term Goals - 10/10/21 1500   ? ?  ? PT LONG TERM GOAL #1  ? Title Patient will increase FOTO score by at least 10 points to demonstrate improvement in mobility and quality of life.   ? Baseline 3/20: 61   ? Time 12   ? Period Weeks   ? Status New   ? Target Date 12/26/21   ?  ? PT LONG TERM GOAL #2  ? Title Patient (> 51 years old) will complete five times sit to stand test in <12 seconds indicating an increased LE strength and improved balance.   ? Baseline 3/20: 13.5 sec (previously 14.8 sec on 3/13)   ? Time 12   ? Period Weeks   ? Status New   ? Target Date 12/26/21   ?  ? PT LONG TERM GOAL #3  ? Title Patient will increase six minute walk test distance to >1000 for progression to community ambulator and improve gait ability   ? Baseline 3/20: deferred (previously 605 ft on 3/13)   ? Time 12   ? Period Weeks   ? Status New   ? Target Date  12/26/21   ?  ? PT LONG TERM GOAL #4  ? Title Patient will increase 10 meter walk test to >1.30ms as to improve gait speed for better community ambulation and to reduce fall risk.   ? Baseline 3/20: 0.64 m/s   ? Time 12   ? Period Weeks   ? Stat

## 2021-10-12 ENCOUNTER — Ambulatory Visit: Payer: Medicare Other

## 2021-10-12 DIAGNOSIS — M6281 Muscle weakness (generalized): Secondary | ICD-10-CM

## 2021-10-12 DIAGNOSIS — R2689 Other abnormalities of gait and mobility: Secondary | ICD-10-CM

## 2021-10-12 DIAGNOSIS — M25551 Pain in right hip: Secondary | ICD-10-CM | POA: Diagnosis not present

## 2021-10-12 NOTE — Therapy (Signed)
[facility] ?[place], [place], [address] ?Phone: [phone]   Fax:  [phone] ? ?Physical Therapy Treatment ? ?Patient Details  ?Name: Michael Cohen ?MRN: [id number] ?Date of Birth: 01/20/1941 ?Referring Provider (PT): Kirk Ruths, MD ? ? ?Encounter Date: 10/12/2021 ? ? PT End of Session - 10/12/21 1447   ? ? Visit Number 3   ? Number of Visits 25   ? Date for PT Re-Evaluation 12/26/21   ? PT Start Time 1346   ? PT Stop Time 2353   ? PT Time Calculation (min) 45 min   ? Equipment Utilized During Treatment Gait belt   ? Activity Tolerance Patient tolerated treatment well   ? Behavior During Therapy [facility] for tasks assessed/performed   ? ?  ?  ? ?  ? ? ?Past Medical History:  ?Diagnosis Date  ? Diabetes mellitus without complication ([place])   ? Hypercholesteremia   ? Hypertension   ? ? ?Past Surgical History:  ?Procedure Laterality Date  ? CATARACT EXTRACTION Bilateral   ? COLONOSCOPY WITH PROPOFOL N/A 12/12/2017  ? Procedure: COLONOSCOPY WITH PROPOFOL;  Surgeon: [name], [name], MD;  Location: [facility] ENDOSCOPY;  Service: Gastroenterology;  Laterality: N/A;  ? EYE SURGERY    ? ? ?There were no vitals filed for this visit. ? ? Subjective Assessment - 10/12/21 1344   ? ? Subjective Pt reports no pain currently but that he did have pain upon first waking and getting out of his bed this morning. Pt with continued limp/antalgic gait.   ? Pertinent History Pt is a 81 yo male who presents to PT with new referral for gait abnormality. Pt known to PT. Pt with c/o recurrence of R hip pain, in addition to R low back pain that radiates down to foot (pt originally seen in clinic for R hip pain, foot). He has noticed worsening "limp." Pt noted with increased antalgic gait pattern since recurrence of pain. Per pt, new onset started approximatley a month and a half ago. Pt with recent MRI on 09/27/21 that indicates ?prominent impingement at L4-5 and mild  impingement L5-S1" (see below for details). Pt currently taking advil for pain management. Pt has ?injection? for low back scheduled for the 31st of this month. Pt reports his pain fluctuates in intensity but is worst in the morning. Pain can also increase pt bending forward. He rates current pain as 3-4/10, worst pain as 8/10 an best pain as 2-3/10. 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 PMH is significant for DMII, hypercholesterimia, HLD, HTN, neuropathy.   ? Limitations Walking;House hold activities;Standing   ? How long can you sit comfortably? Few hours   ? How long can you stand comfortably? Limited first thing in morning due to increased R LBP/hip pain that radiates down RLE   ? How long can you walk comfortably? Pain onset first thing in morning, limited immediately, limited generally with long distances   ? Diagnostic tests 09/27/21: MR lumbar spine ?IMPRESSION:   1. Lumbar spondylosis and degenerative disc disease cause prominent   impingement at L4-5 and mild impingement at L5-S1. The dominant   finding is the L4-5 right lateral recess disc extrusion versus disc   fragment exerting mass effect on the right L5 nerves.   2. 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  "MR Hip R WO contrast 12/01/20: "IMPRESSION:  1. Mild symmetric degenerative spurring of both femoral heads and  acetabula. A specific cause for the patient's recent hip snap and  hip pain is not otherwise identified.  2. Sigmoid colon diverticulosis.";   ? Patient Stated Goals decrease pain, improve walking   ? Currently in Pain? No/denies   ? Pain Onset More than a month ago   ? Pain Onset More than a month ago   ? ?  ?  ? ?  ? ? ?INTERVENTIONS- ?  ?THEREX- ? ?Pball rollouts forward/backward, diagonally 10x for each B directions ? ?Standing hip abduction 2x15 each LE ? ?Supine on  mat table: ?Green pball hamstring curls 20x  ? ?BLEs on green pball glute bridge 10x. Pt reports feeling a stretch ? ?Prone press up to stretch on forearms 4 x 15-20 sec ? ?Supine piriformis stretch 2x45 sec each LE  ? ?Supine hamstring stretch 2x45 sec B ?--progressed to performing with nerve floss. Pt with stiffness/decreased ROM B. ? ?LTRs 20x B ? ?Cat camel 8x. Noted hypomobility with spinal ext. ?Modified child's pose 10x ? ?Open book 12x each side  ? ?STS 10x  ? ?Pt educated throughout session about proper posture and technique with exercises. Improved exercise technique, movement at target joints, use of target muscles after min to mod verbal, visual, tactile cues. ? ? ? ? ? ? PT Short Term Goals - 10/03/21 1719   ? ?  ? PT SHORT TERM GOAL #1  ? Title Patient will be independent in home exercise program to improve strength/mobility for better functional independence with ADLs.   ? Baseline 3/20: to be issued next 1-2 visits   ? Time 6   ? Period Weeks   ? Status New   ? Target Date 11/14/21   ?  ? PT SHORT TERM GOAL #2  ? Title Pt will report a worst pain of no greater than 3/10 within past two weeks to indicate improved QOL   ? Baseline 3/20: 8/10   ? Time 6   ? Period Weeks   ? Status New   ? Target Date 11/14/21   ? ?  ?  ? ?  ? ? ? ? PT Long Term Goals - 10/10/21 1500   ? ?  ? PT LONG TERM GOAL #1  ? Title Patient will increase FOTO score by at least 10 points to demonstrate improvement in mobility and quality of life.   ? Baseline 3/20: 61   ? Time 12   ? Period Weeks   ? Status New   ? Target Date 12/26/21   ?  ? PT LONG TERM GOAL #2  ? Title Patient (> 49 years old) will complete five times sit to stand test in <12 seconds indicating an increased LE strength and improved balance.   ? Baseline 3/20: 13.5 sec (previously 14.8 sec on 3/13)   ? Time 12   ? Period Weeks   ? Status New   ? Target Date 12/26/21   ?  ? PT LONG TERM GOAL #3  ? Title Patient will increase six minute walk test distance to >1000  for progression to community ambulator and improve gait ability   ? Baseline 3/20: deferred (previously 605 ft on 3/13)   ? Time 12   ? Period Weeks   ? Status New   ? Target Date 12/26/21   ?  ? PT LONG TERM GOAL #  4  ? Title Patient will increase 10 meter walk test to >1.42ms as to improve gait speed for better community ambulation and to reduce fall risk.   ? Baseline 3/20: 0.64 m/s   ? Time 12   ? Period Weeks   ? Status New   ? Target Date 12/26/21   ?  ? PT LONG TERM GOAL #5  ? Title Patient will increase Berg Balance score by > 6 points to demonstrate decreased fall risk during functional activities.   ? Baseline 3/20: deferred; 3/27: 48/56   ? Time 12   ? Period Weeks   ? Status New   ? Target Date 12/26/21   ? ?  ?  ? ?  ? ? ? ? ? ? ? ? Plan - 10/12/21 1444   ? ? Clinical Impression Statement Pt with good motivation to participate in PT. He tolerates interventions well without pain. Pt continues to exhibit B stiffness of hamstrings when PT provides hamstring stretch (performed through limited ROM). Pt also noted hypomobility into thoracolumbar extension with cat-camel intervention. The pt will continue to benefit from further skilled PT to improve pain, mobility, and strength.   ? Personal Factors and Comorbidities Comorbidity 3+;Age   ? Comorbidities diabetes, hypertension, hyperlipidemia, neuropathy   ? Examination-Activity Limitations Locomotion Level;Lift;Transfers;Stand   ? Examination-Participation Restrictions Community Activity;Shop;Yard Work;Cleaning   ? Stability/Clinical Decision Making Evolving/Moderate complexity   ? Rehab Potential Fair   ? PT Frequency 2x / week   ? PT Duration 12 weeks   ? PT Treatment/Interventions Moist Heat;Electrical Stimulation;Gait training;Stair training;Functional mobility training;Therapeutic activities;Therapeutic exercise;Balance training;Neuromuscular re-education;Patient/family education;Manual techniques;Passive range of motion;Energy conservation;ADLs/Self Care  Home Management;Biofeedback;Cryotherapy;Traction;Ultrasound;DME Instruction;Orthotic Fit/Training;Splinting;Taping;Dry needling;Joint Manipulations   ? PT Next Visit Plan complete further assessment, HE

## 2021-10-17 ENCOUNTER — Ambulatory Visit: Payer: Medicare Other | Attending: Internal Medicine

## 2021-10-17 ENCOUNTER — Ambulatory Visit: Payer: Medicare Other

## 2021-10-17 DIAGNOSIS — M25551 Pain in right hip: Secondary | ICD-10-CM | POA: Insufficient documentation

## 2021-10-17 DIAGNOSIS — M25552 Pain in left hip: Secondary | ICD-10-CM | POA: Insufficient documentation

## 2021-10-17 DIAGNOSIS — R2681 Unsteadiness on feet: Secondary | ICD-10-CM | POA: Diagnosis present

## 2021-10-17 DIAGNOSIS — M6281 Muscle weakness (generalized): Secondary | ICD-10-CM | POA: Diagnosis present

## 2021-10-17 DIAGNOSIS — R278 Other lack of coordination: Secondary | ICD-10-CM | POA: Insufficient documentation

## 2021-10-17 DIAGNOSIS — R269 Unspecified abnormalities of gait and mobility: Secondary | ICD-10-CM | POA: Insufficient documentation

## 2021-10-17 DIAGNOSIS — R2689 Other abnormalities of gait and mobility: Secondary | ICD-10-CM | POA: Insufficient documentation

## 2021-10-17 NOTE — Therapy (Signed)
[facility] ?[place], [place], [address] ?Phone: [phone]   Fax:  [phone] ? ?Physical Therapy Treatment ? ?Patient Details  ?Name: Michael Cohen ?MRN: [id number] ?Date of Birth: [birth year] ?Referring Provider (PT): Kirk Ruths, MD ? ? ?Encounter Date: 10/17/2021 ? ? PT End of Session - 10/17/21 1314   ? ? Visit Number 4   ? Number of Visits 25   ? Date for PT Re-Evaluation 12/26/21   ? PT Start Time 1304   ? PT Stop Time 1344   ? PT Time Calculation (min) 40 min   ? Equipment Utilized During Treatment Gait belt   ? Activity Tolerance Patient tolerated treatment well   ? Behavior During Therapy [facility] for tasks assessed/performed   ? ?  ?  ? ?  ? ? ?Past Medical History:  ?Diagnosis Date  ? Diabetes mellitus without complication ([place])   ? Hypercholesteremia   ? Hypertension   ? ? ?Past Surgical History:  ?Procedure Laterality Date  ? CATARACT EXTRACTION Bilateral   ? COLONOSCOPY WITH PROPOFOL N/A 12/12/2017  ? Procedure: COLONOSCOPY WITH PROPOFOL;  Surgeon: [name], [name], MD;  Location: [facility] ENDOSCOPY;  Service: Gastroenterology;  Laterality: N/A;  ? EYE SURGERY    ? ? ?There were no vitals filed for this visit. ? ? Subjective Assessment - 10/17/21 1308   ? ? Subjective Patient received his injection on Friday, Reports his pain has improved, no pain now. Has slight limp/antalgic gait pattern.   ? Pertinent History Pt is a 81 yo male who presents to PT with new referral for gait abnormality. Pt known to PT. Pt with c/o recurrence of R hip pain, in addition to R low back pain that radiates down to foot (pt originally seen in clinic for R hip pain, foot). He has noticed worsening "limp." Pt noted with increased antalgic gait pattern since recurrence of pain. Per pt, new onset started approximatley a month and a half ago. Pt with recent MRI on 09/27/21 that indicates ?prominent impingement at L4-5 and mild impingement L5-S1" (see below  for details). Pt currently taking advil for pain management. Pt has ?injection? for low back scheduled for the 31st of this month. Pt reports his pain fluctuates in intensity but is worst in the morning. Pain can also increase pt bending forward. He rates current pain as 3-4/10, worst pain as 8/10 an best pain as 2-3/10. 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 PMH is significant for DMII, hypercholesterimia, HLD, HTN, neuropathy.   ? Limitations Walking;House hold activities;Standing   ? How long can you sit comfortably? Few hours   ? How long can you stand comfortably? Limited first thing in morning due to increased R LBP/hip pain that radiates down RLE   ? How long can you walk comfortably? Pain onset first thing in morning, limited immediately, limited generally with long distances   ? Diagnostic tests 09/27/21: MR lumbar spine ?IMPRESSION:   1. Lumbar spondylosis and degenerative disc disease cause prominent   impingement at L4-5 and mild impingement at L5-S1. The dominant   finding is the L4-5 right lateral recess disc extrusion versus disc   fragment exerting mass effect on the right L5 nerves.   2. 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  "MR Hip R  WO contrast 12/01/20: "IMPRESSION:  1. Mild symmetric degenerative spurring of both femoral heads and  acetabula. A specific cause for the patient's recent hip snap and  hip pain is not otherwise identified.  2. Sigmoid colon diverticulosis.";   ? Patient Stated Goals decrease pain, improve walking   ? Currently in Pain? No/denies   ? ?  ?  ? ?  ? ? ? ? ? ? ? ?TherEx:  ?Standing next to support bar: ?-hip flexion 15x each LE; cue for decreasing velocity of movement. X2 sets ?-hip abduction 15x each LE x 2 sets  ?-hip extension 15x each LE x2 sets ?-hamstring curl 15x each LE x2 sets  ? ?Seated: ?STS  10x ? ?Supine: ?Tra contraction pressing into swiss ball with hands and knees 10x 3 second holds  ?TrA contraction with contralateral UE/LE raises 10x  ?Posterior pelvic tilt 10x 3 second holds ?Bridge 10x  ? ?Pt educated throughout session about proper posture and technique with exercises. Improved exercise technique, movement at target joints, use of target muscles after min to mod verbal, visual, tactile cues.  ? ? ? ? ? ?Patient tolerates therapy well with excellent motivation. Intermittent rest breaks required. No pain increase throughout session. Patient does continue to have antalgic gait pattern despite no pain present. Core activation and LE strengthening tolerated well. The pt will continue to benefit from further skilled PT to improve pain, mobility, and strength. ? ? ? ? ? ? ? ? ? ? ? ? ? PT Education - 10/17/21 1313   ? ? Education Details exercise technique, body mechanics   ? Person(s) Educated Patient   ? Methods Explanation;Demonstration;Tactile cues;Verbal cues   ? Comprehension Verbalized understanding;Returned demonstration;Verbal cues required;Tactile cues required   ? ?  ?  ? ?  ? ? ? PT Short Term Goals - 10/03/21 1719   ? ?  ? PT SHORT TERM GOAL #1  ? Title Patient will be independent in home exercise program to improve strength/mobility for better functional independence with ADLs.   ? Baseline 3/20: to be issued next 1-2 visits   ? Time 6   ? Period Weeks   ? Status New   ? Target Date 11/14/21   ?  ? PT SHORT TERM GOAL #2  ? Title Pt will report a worst pain of no greater than 3/10 within past two weeks to indicate improved QOL   ? Baseline 3/20: 8/10   ? Time 6   ? Period Weeks   ? Status New   ? Target Date 11/14/21   ? ?  ?  ? ?  ? ? ? ? PT Long Term Goals - 10/10/21 1500   ? ?  ? PT LONG TERM GOAL #1  ? Title Patient will increase FOTO score by at least 10 points to demonstrate improvement in mobility and quality of life.   ? Baseline 3/20: 61   ? Time 12   ? Period Weeks   ? Status  New   ? Target Date 12/26/21   ?  ? PT LONG TERM GOAL #2  ? Title Patient (> 24 years old) will complete five times sit to stand test in <12 seconds indicating an increased LE strength and improved balance.   ? Baseline 3/20: 13.5 sec (previously 14.8 sec on 3/13)   ? Time 12   ? Period Weeks   ? Status New   ? Target Date 12/26/21   ?  ? PT LONG TERM GOAL #  3  ? Title Patient will increase six minute walk test distance to >1000 for progression to community ambulator and improve gait ability   ? Baseline 3/20: deferred (previously 605 ft on 3/13)   ? Time 12   ? Period Weeks   ? Status New   ? Target Date 12/26/21   ?  ? PT LONG TERM GOAL #4  ? Title Patient will increase 10 meter walk test to >1.43ms as to improve gait speed for better community ambulation and to reduce fall risk.   ? Baseline 3/20: 0.64 m/s   ? Time 12   ? Period Weeks   ? Status New   ? Target Date 12/26/21   ?  ? PT LONG TERM GOAL #5  ? Title Patient will increase Berg Balance score by > 6 points to demonstrate decreased fall risk during functional activities.   ? Baseline 3/20: deferred; 3/27: 48/56   ? Time 12   ? Period Weeks   ? Status New   ? Target Date 12/26/21   ? ?  ?  ? ?  ? ? ? ? ? ? ? ? Plan - 10/17/21 1332   ? ? Clinical Impression Statement Patient tolerates therapy well with excellent motivation. Intermittent rest breaks required. No pain increase throughout session. Patient does continue to have antalgic gait pattern despite no pain present. Core activation and LE strengthening tolerated well. The pt will continue to benefit from further skilled PT to improve pain, mobility, and strength.   ? Personal Factors and Comorbidities Comorbidity 3+;Age   ? Comorbidities diabetes, hypertension, hyperlipidemia, neuropathy   ? Examination-Activity Limitations Locomotion Level;Lift;Transfers;Stand   ? Examination-Participation Restrictions Community Activity;Shop;Yard Work;Cleaning   ? Stability/Clinical Decision Making Evolving/Moderate  complexity   ? Rehab Potential Fair   ? PT Frequency 2x / week   ? PT Duration 12 weeks   ? PT Treatment/Interventions Moist Heat;Electrical Stimulation;Gait training;Stair training;Functional mobility train

## 2021-10-19 ENCOUNTER — Ambulatory Visit: Payer: Medicare Other | Admitting: Physical Therapy

## 2021-10-24 ENCOUNTER — Ambulatory Visit: Payer: Medicare Other

## 2021-10-24 DIAGNOSIS — R2689 Other abnormalities of gait and mobility: Secondary | ICD-10-CM

## 2021-10-24 DIAGNOSIS — M25551 Pain in right hip: Secondary | ICD-10-CM

## 2021-10-24 DIAGNOSIS — R269 Unspecified abnormalities of gait and mobility: Secondary | ICD-10-CM

## 2021-10-24 DIAGNOSIS — M6281 Muscle weakness (generalized): Secondary | ICD-10-CM

## 2021-10-24 DIAGNOSIS — R2681 Unsteadiness on feet: Secondary | ICD-10-CM

## 2021-10-24 NOTE — Therapy (Signed)
[facility] ?[place], [place], [address] ?Phone: [phone]   Fax:  [phone] ? ?Physical Therapy Treatment ? ?Patient Details  ?Name: Michael Cohen ?MRN: [id number] ?Date of Birth: 1941-04-11 ?Referring Provider (PT): Kirk Ruths, MD ? ? ?Encounter Date: 10/24/2021 ? ? PT End of Session - 10/24/21 1259   ? ? Visit Number 5   ? Number of Visits 25   ? Date for PT Re-Evaluation 12/26/21   ? PT Start Time 1300   ? PT Stop Time 4128   ? PT Time Calculation (min) 45 min   ? Equipment Utilized During Treatment Gait belt   ? Activity Tolerance Patient tolerated treatment well   ? Behavior During Therapy [facility] for tasks assessed/performed   ? ?  ?  ? ?  ? ? ?Past Medical History:  ?Diagnosis Date  ? Diabetes mellitus without complication ([place])   ? Hypercholesteremia   ? Hypertension   ? ? ?Past Surgical History:  ?Procedure Laterality Date  ? CATARACT EXTRACTION Bilateral   ? COLONOSCOPY WITH PROPOFOL N/A 12/12/2017  ? Procedure: COLONOSCOPY WITH PROPOFOL;  Surgeon: [name], [name], MD;  Location: [facility] ENDOSCOPY;  Service: Gastroenterology;  Laterality: N/A;  ? EYE SURGERY    ? ? ?There were no vitals filed for this visit. ? ? Subjective Assessment - 10/24/21 1315   ? ? Subjective Patient had a good weekend. No falls or LOB since last session.   ? Pertinent History Pt is a 81 yo male who presents to PT with new referral for gait abnormality. Pt known to PT. Pt with c/o recurrence of R hip pain, in addition to R low back pain that radiates down to foot (pt originally seen in clinic for R hip pain, foot). He has noticed worsening "limp." Pt noted with increased antalgic gait pattern since recurrence of pain. Per pt, new onset started approximatley a month and a half ago. Pt with recent MRI on 09/27/21 that indicates ?prominent impingement at L4-5 and mild impingement L5-S1" (see below for details). Pt currently taking advil for pain management.  Pt has ?injection? for low back scheduled for the 31st of this month. Pt reports his pain fluctuates in intensity but is worst in the morning. Pain can also increase pt bending forward. He rates current pain as 3-4/10, worst pain as 8/10 an best pain as 2-3/10. 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 PMH is significant for DMII, hypercholesterimia, HLD, HTN, neuropathy.   ? Limitations Walking;House hold activities;Standing   ? How long can you sit comfortably? Few hours   ? How long can you stand comfortably? Limited first thing in morning due to increased R LBP/hip pain that radiates down RLE   ? How long can you walk comfortably? Pain onset first thing in morning, limited immediately, limited generally with long distances   ? Diagnostic tests 09/27/21: MR lumbar spine ?IMPRESSION:   1. Lumbar spondylosis and degenerative disc disease cause prominent   impingement at L4-5 and mild impingement at L5-S1. The dominant   finding is the L4-5 right lateral recess disc extrusion versus disc   fragment exerting mass effect on the right L5 nerves.   2. 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  "MR Hip R WO contrast 12/01/20: "IMPRESSION:  1. Mild  symmetric degenerative spurring of both femoral heads and  acetabula. A specific cause for the patient's recent hip snap and  hip pain is not otherwise identified.  2. Sigmoid colon diverticulosis.";   ? Patient Stated Goals decrease pain, improve walking   ? Currently in Pain? Yes   ? Pain Score 1    ? Pain Location Back   ? Pain Orientation Lower   ? Pain Descriptors / Indicators Aching   ? ?  ?  ? ?  ? ? ? ? ? ? ?  ?TherEx:  ?Standing next to support bar with #2lb ankle weights ?-hip flexion 15x each LE; cue for decreasing velocity of movement. X2 sets ?-hip abduction 15x each LE x 2 sets  ?-hip extension 15x each  LE x2 sets ?-hamstring curl 15x each LE x2 sets  ?  ?Seated: ?STS 10x ?  ?With 2lb ankle weight: ?-march 15x each LE arms crossed without back support  ?-LAQ 3 second holds each LE 10x each LE ?-ER/IR 15x each LE;  ? ?Supine: ?Tra contraction pressing into swiss ball with hands and knees 10x 3 second holds  ?TrA contraction with contralateral UE/LE raises 10x  ?Posterior pelvic tilt 20x 3 second holds ?Bridge 10x  ?  ? ?Sidleying:  ?-abduction 10x each LE ?-clamshell 15x  each LE ? ? ?Pt educated throughout session about proper posture and technique with exercises. Improved exercise technique, movement at target joints, use of target muscles after min to mod verbal, visual, tactile cues.  ?  ? ? ?Patient presents with excellent motivation throughout physical therapy session. He struggles with prolonged posterior chain and abductor recruitment indicating continued focus in future sessions. Core activation tolerated well. The pt will continue to benefit from further skilled PT to improve pain, mobility, and strength. ? ? ? ? ? ? ? ? ? ? ? ? ? ? ? ? ? ? ? PT Education - 10/24/21 1258   ? ? Education Details exercise technique, body mechanics   ? Person(s) Educated Patient   ? Methods Explanation;Demonstration;Tactile cues;Verbal cues   ? Comprehension Verbalized understanding;Returned demonstration;Verbal cues required;Tactile cues required   ? ?  ?  ? ?  ? ? ? PT Short Term Goals - 10/03/21 1719   ? ?  ? PT SHORT TERM GOAL #1  ? Title Patient will be independent in home exercise program to improve strength/mobility for better functional independence with ADLs.   ? Baseline 3/20: to be issued next 1-2 visits   ? Time 6   ? Period Weeks   ? Status New   ? Target Date 11/14/21   ?  ? PT SHORT TERM GOAL #2  ? Title Pt will report a worst pain of no greater than 3/10 within past two weeks to indicate improved QOL   ? Baseline 3/20: 8/10   ? Time 6   ? Period Weeks   ? Status New   ? Target Date 11/14/21   ? ?  ?  ? ?   ? ? ? ? PT Long Term Goals - 10/10/21 1500   ? ?  ? PT LONG TERM GOAL #1  ? Title Patient will increase FOTO score by at least 10 points to demonstrate improvement in mobility and quality of life.   ? Baseline 3/20: 61   ? Time 12   ? Period Weeks   ? Status New   ? Target Date 12/26/21   ?  ? PT LONG TERM GOAL #2  ?  Title Patient (> 41 years old) will complete five times sit to stand test in <12 seconds indicating an increased LE strength and improved balance.   ? Baseline 3/20: 13.5 sec (previously 14.8 sec on 3/13)   ? Time 12   ? Period Weeks   ? Status New   ? Target Date 12/26/21   ?  ? PT LONG TERM GOAL #3  ? Title Patient will increase six minute walk test distance to >1000 for progression to community ambulator and improve gait ability   ? Baseline 3/20: deferred (previously 605 ft on 3/13)   ? Time 12   ? Period Weeks   ? Status New   ? Target Date 12/26/21   ?  ? PT LONG TERM GOAL #4  ? Title Patient will increase 10 meter walk test to >1.41ms as to improve gait speed for better community ambulation and to reduce fall risk.   ? Baseline 3/20: 0.64 m/s   ? Time 12   ? Period Weeks   ? Status New   ? Target Date 12/26/21   ?  ? PT LONG TERM GOAL #5  ? Title Patient will increase Berg Balance score by > 6 points to demonstrate decreased fall risk during functional activities.   ? Baseline 3/20: deferred; 3/27: 48/56   ? Time 12   ? Period Weeks   ? Status New   ? Target Date 12/26/21   ? ?  ?  ? ?  ? ? ? ? ? ? ? ? Plan - 10/24/21 1334   ? ? Clinical Impression Statement Patient presents with excellent motivation throughout physical therapy session. He struggles with prolonged posterior chain and abductor recruitment indicating continued focus in future sessions. Core activation tolerated well. The pt will continue to benefit from further skilled PT to improve pain, mobility, and strength.   ? Personal Factors and Comorbidities Comorbidity 3+;Age   ? Comorbidities diabetes, hypertension, hyperlipidemia,  neuropathy   ? Examination-Activity Limitations Locomotion Level;Lift;Transfers;Stand   ? Examination-Participation Restrictions Community Activity;Shop;Yard Work;Cleaning   ? Stability/Clinical Decision [employer]

## 2021-10-26 ENCOUNTER — Ambulatory Visit: Payer: Medicare Other

## 2021-10-26 DIAGNOSIS — R2689 Other abnormalities of gait and mobility: Secondary | ICD-10-CM | POA: Diagnosis not present

## 2021-10-26 DIAGNOSIS — M25551 Pain in right hip: Secondary | ICD-10-CM

## 2021-10-26 DIAGNOSIS — M6281 Muscle weakness (generalized): Secondary | ICD-10-CM

## 2021-10-26 DIAGNOSIS — R2681 Unsteadiness on feet: Secondary | ICD-10-CM

## 2021-10-26 NOTE — Therapy (Signed)
[place] [facility] ?[place], [place], [address] ?Phone: [phone]   Fax:  [phone] ? ?Physical Therapy Treatment ? ?Patient Details  ?Name: Michael Cohen ?MRN: [id number] ?Date of Birth: 10/19/1940 ?Referring Provider (PT): Kirk Ruths, MD ? ? ?Encounter Date: 10/26/2021 ? ? PT End of Session - 10/26/21 1351   ? ? Visit Number 6   ? Number of Visits 25   ? Date for PT Re-Evaluation 12/26/21   ? PT Start Time 1345   ? PT Stop Time 8185   ? PT Time Calculation (min) 44 min   ? Equipment Utilized During Treatment Gait belt   ? Activity Tolerance Patient tolerated treatment well   ? Behavior During Therapy [facility] for tasks assessed/performed   ? ?  ?  ? ?  ? ? ?Past Medical History:  ?Diagnosis Date  ? Diabetes mellitus without complication ([place])   ? Hypercholesteremia   ? Hypertension   ? ? ?Past Surgical History:  ?Procedure Laterality Date  ? CATARACT EXTRACTION Bilateral   ? COLONOSCOPY WITH PROPOFOL N/A 12/12/2017  ? Procedure: COLONOSCOPY WITH PROPOFOL;  Surgeon: [name], [name], MD;  Location: [facility] ENDOSCOPY;  Service: Gastroenterology;  Laterality: N/A;  ? EYE SURGERY    ? ? ?There were no vitals filed for this visit. ? ? Subjective Assessment - 10/26/21 1350   ? ? Subjective Patient reports feeling well, no pain today. Has been walking with his dog.   ? Pertinent History Pt is a 81 yo male who presents to PT with new referral for gait abnormality. Pt known to PT. Pt with c/o recurrence of R hip pain, in addition to R low back pain that radiates down to foot (pt originally seen in clinic for R hip pain, foot). He has noticed worsening "limp." Pt noted with increased antalgic gait pattern since recurrence of pain. Per pt, new onset started approximatley a month and a half ago. Pt with recent MRI on 09/27/21 that indicates ?prominent impingement at L4-5 and mild impingement L5-S1" (see below for details). Pt currently taking advil for pain  management. Pt has ?injection? for low back scheduled for the 31st of this month. Pt reports his pain fluctuates in intensity but is worst in the morning. Pain can also increase pt bending forward. He rates current pain as 3-4/10, worst pain as 8/10 an best pain as 2-3/10. 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 PMH is significant for DMII, hypercholesterimia, HLD, HTN, neuropathy.   ? Limitations Walking;House hold activities;Standing   ? How long can you sit comfortably? Few hours   ? How long can you stand comfortably? Limited first thing in morning due to increased R LBP/hip pain that radiates down RLE   ? How long can you walk comfortably? Pain onset first thing in morning, limited immediately, limited generally with long distances   ? Diagnostic tests 09/27/21: MR lumbar spine ?IMPRESSION:   1. Lumbar spondylosis and degenerative disc disease cause prominent   impingement at L4-5 and mild impingement at L5-S1. The dominant   finding is the L4-5 right lateral recess disc extrusion versus disc   fragment exerting mass effect on the right L5 nerves.   2. 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  "MR Hip R WO contrast 12/01/20: "IMPRESSION:  1.  Mild symmetric degenerative spurring of both femoral heads and  acetabula. A specific cause for the patient's recent hip snap and  hip pain is not otherwise identified.  2. Sigmoid colon diverticulosis.";   ? Patient Stated Goals decrease pain, improve walking   ? Currently in Pain? No/denies   ? ?  ?  ? ?  ? ? ? ? ? ? ?  ?TherEx:  ?Nustep Lvl 4 seat position 9 arms 10 cues for SPM> 60 for cardiovascular and musculoskeletal challenge.  ? ?Standing next to support bar with #3lb ankle weights ?-toe taps 15x each LE;  ? ? ?Seated: ?STS 10x ?Adductor squeeze between feet with LAQ 10x ?Adductor ball squeeze 10x 3  second holds ? ?With 2lb ankle weight: ?-lateral step over hedgehog 10x  ?-march 15x each LE arms crossed without back support  ? ?  ?Supine: ?Tra contraction pressing into swiss ball with hands and knees 10x 3 second holds  ?TrA contraction with contralateral UE/LE raises 10x  ?Green swiss ball hamstring curl 15x  ?Posterior pelvic tilt 20x 3 second holds ?Bridge 15x  ? LE rotation 30 seconds x 2  ?  ?Sidleying:  ?-clamshell 15x  each LE with GTB ?-reverse clamshell 15x each LE ?  ?  ?Pt educated throughout session about proper posture and technique with exercises. Improved exercise technique, movement at target joints, use of target muscles after min to mod verbal, visual, tactile cues.  ?  ?Patient is very motivated throughout session. Core and LE strengthening tolerated well with occasional rest breaks. No pain throughout session. Patient does continue to have antalgic gait pattern despite no pain presentThe pt will continue to benefit from further skilled PT to improve pain, mobility, and strength ? ? ? ? ? ? ? ? ? ? ? ? ? ? ? ? ? ? ? ? ? PT Education - 10/26/21 1351   ? ? Education Details exercise technique, body mechanics   ? Person(s) Educated Patient   ? Methods Explanation;Demonstration;Tactile cues;Verbal cues   ? Comprehension Verbalized understanding;Returned demonstration;Verbal cues required;Tactile cues required   ? ?  ?  ? ?  ? ? ? PT Short Term Goals - 10/03/21 1719   ? ?  ? PT SHORT TERM GOAL #1  ? Title Patient will be independent in home exercise program to improve strength/mobility for better functional independence with ADLs.   ? Baseline 3/20: to be issued next 1-2 visits   ? Time 6   ? Period Weeks   ? Status New   ? Target Date 11/14/21   ?  ? PT SHORT TERM GOAL #2  ? Title Pt will report a worst pain of no greater than 3/10 within past two weeks to indicate improved QOL   ? Baseline 3/20: 8/10   ? Time 6   ? Period Weeks   ? Status New   ? Target Date 11/14/21   ? ?  ?  ? ?  ? ? ? ? PT  Long Term Goals - 10/10/21 1500   ? ?  ? PT LONG TERM GOAL #1  ? Title Patient will increase FOTO score by at least 10 points to demonstrate improvement in mobility and quality of life.   ? Baseline 3/20: 61   ? Time 12   ? Period Weeks   ? Status New   ? Target Date 12/26/21   ?  ? PT LONG TERM GOAL #2  ? Title Patient (> 66 years old) will  complete five times sit to stand test in <12 seconds indicating an increased LE strength and improved balance.   ? Baseline 3/20: 13.5 sec (previously 14.8 sec on 3/13)   ? Time 12   ? Period Weeks   ? Status New   ? Target Date 12/26/21   ?  ? PT LONG TERM GOAL #3  ? Title Patient will increase six minute walk test distance to >1000 for progression to community ambulator and improve gait ability   ? Baseline 3/20: deferred (previously 605 ft on 3/13)   ? Time 12   ? Period Weeks   ? Status New   ? Target Date 12/26/21   ?  ? PT LONG TERM GOAL #4  ? Title Patient will increase 10 meter walk test to >1.72ms as to improve gait speed for better community ambulation and to reduce fall risk.   ? Baseline 3/20: 0.64 m/s   ? Time 12   ? Period Weeks   ? Status New   ? Target Date 12/26/21   ?  ? PT LONG TERM GOAL #5  ? Title Patient will increase Berg Balance score by > 6 points to demonstrate decreased fall risk during functional activities.   ? Baseline 3/20: deferred; 3/27: 48/56   ? Time 12   ? Period Weeks   ? Status New   ? Target Date 12/26/21   ? ?  ?  ? ?  ? ? ? ? ? ? ? ? Plan - 10/26/21 1407   ? ? Clinical Impression Statement Patient is very motivated throughout session. Core and LE strengthening tolerated well with occasional rest breaks. No pain throughout session. Patient does continue to have antalgic gait pattern despite no pain presentThe pt will continue to benefit from further skilled PT to improve pain, mobility, and strength   ? Personal Factors and Comorbidities Comorbidity 3+;Age   ? Comorbidities diabetes, hypertension, hyperlipidemia, neuropathy   ?  Examination-Activity Limitations Locomotion Level;Lift;Transfers;Stand   ? Examination-Participation Restrictions Community Activity;Shop;Yard Work;Cleaning   ? Stability/Clinical Decision Making Evolving/Moderate c

## 2021-10-31 ENCOUNTER — Ambulatory Visit: Payer: Medicare Other

## 2021-10-31 DIAGNOSIS — R2689 Other abnormalities of gait and mobility: Secondary | ICD-10-CM | POA: Diagnosis not present

## 2021-10-31 DIAGNOSIS — R2681 Unsteadiness on feet: Secondary | ICD-10-CM

## 2021-10-31 DIAGNOSIS — M25551 Pain in right hip: Secondary | ICD-10-CM

## 2021-10-31 DIAGNOSIS — M6281 Muscle weakness (generalized): Secondary | ICD-10-CM

## 2021-10-31 NOTE — Therapy (Signed)
[place] [facility] ?[place], [place], [address] ?Phone: [phone]   Fax:  [phone] ? ?Physical Therapy Treatment ? ?Patient Details  ?Name: Michael Cohen ?MRN: [id number] ?Date of Birth: 08-Jul-1941 ?Referring Provider (PT): Kirk Ruths, MD ? ? ?Encounter Date: 10/31/2021 ? ? PT End of Session - 10/31/21 1347   ? ? Visit Number 7   ? Number of Visits 25   ? Date for PT Re-Evaluation 12/26/21   ? PT Start Time 1345   ? PT Stop Time 2202   ? PT Time Calculation (min) 44 min   ? Equipment Utilized During Treatment Gait belt   ? Activity Tolerance Patient tolerated treatment well   ? Behavior During Therapy [facility] for tasks assessed/performed   ? ?  ?  ? ?  ? ? ?Past Medical History:  ?Diagnosis Date  ? Diabetes mellitus without complication ([place])   ? Hypercholesteremia   ? Hypertension   ? ? ?Past Surgical History:  ?Procedure Laterality Date  ? CATARACT EXTRACTION Bilateral   ? COLONOSCOPY WITH PROPOFOL N/A 12/12/2017  ? Procedure: COLONOSCOPY WITH PROPOFOL;  Surgeon: [name], [name], MD;  Location: [facility] ENDOSCOPY;  Service: Gastroenterology;  Laterality: N/A;  ? EYE SURGERY    ? ? ?There were no vitals filed for this visit. ? ? Subjective Assessment - 10/31/21 1350   ? ? Subjective Patient reports doing well, no pain today. Went out to eat since last session.   ? Pertinent History Pt is a 81 yo male who presents to PT with new referral for gait abnormality. Pt known to PT. Pt with c/o recurrence of R hip pain, in addition to R low back pain that radiates down to foot (pt originally seen in clinic for R hip pain, foot). He has noticed worsening "limp." Pt noted with increased antalgic gait pattern since recurrence of pain. Per pt, new onset started approximatley a month and a half ago. Pt with recent MRI on 09/27/21 that indicates ?prominent impingement at L4-5 and mild impingement L5-S1" (see below for details). Pt currently taking advil for  pain management. Pt has ?injection? for low back scheduled for the 31st of this month. Pt reports his pain fluctuates in intensity but is worst in the morning. Pain can also increase pt bending forward. He rates current pain as 3-4/10, worst pain as 8/10 an best pain as 2-3/10. 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 PMH is significant for DMII, hypercholesterimia, HLD, HTN, neuropathy.   ? Limitations Walking;House hold activities;Standing   ? How long can you sit comfortably? Few hours   ? How long can you stand comfortably? Limited first thing in morning due to increased R LBP/hip pain that radiates down RLE   ? How long can you walk comfortably? Pain onset first thing in morning, limited immediately, limited generally with long distances   ? Diagnostic tests 09/27/21: MR lumbar spine ?IMPRESSION:   1. Lumbar spondylosis and degenerative disc disease cause prominent   impingement at L4-5 and mild impingement at L5-S1. The dominant   finding is the L4-5 right lateral recess disc extrusion versus disc   fragment exerting mass effect on the right L5 nerves.   2. 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  "MR Hip R WO contrast 12/01/20: "IMPRESSION:  1. Mild symmetric degenerative spurring of both femoral heads and  acetabula. A specific cause for the patient's recent hip snap and  hip pain is not otherwise identified.  2. Sigmoid colon diverticulosis.";   ? Patient Stated Goals decrease pain, improve walking   ? Currently in Pain? No/denies   ? ?  ?  ? ?  ? ? ? ? ? ? ? ? ? ?  ?TherEx:  ?Nustep Lvl 4 seat position 9 arms 10 cues for SPM> 60 for cardiovascular and musculoskeletal challenge.  ?  ?Standing next to support bar  ?-SLS 30 seconds each LE x2 trials ?-squat with UE support 10x  ?-toe taps 15x each LE;  ?  ?  ?Seated: ?STS 10x ?   ?Supine: ? ?Hamstring lengthening with leg on PT shoulder 60 seconds each LE ?Single knee to chest with PT guidance 60 seconds each LE ?Figure four stretch 60 seconds each LE ?Tra contraction pressing into swiss ball with hands and knees 10x 3 second holds  ?TrA contraction with contralateral UE/LE raises 10x  ?Green swiss ball hamstring curl 15x  ?Posterior pelvic tilt 20x 3 second holds ?Bridge 15x  ? LE rotation 30 seconds x 2  ?  ?Sidleying:  ?-clamshell  20x  each LE with GTB ?-reverse clamshell 15x each LE ?-hip abduction with straight leg 10x each LE   ?  ?Pt educated throughout session about proper posture and technique with exercises. Improved exercise technique, movement at target joints, use of target muscles after min to mod verbal, visual, tactile cues.  ?  ? ?Patient presents with excellent motivation throughout physical therapy session. Patient is able to do short ambulation's with his dog. Cross body coordination is challenging for patient to perform with TrA activations. The pt will continue to benefit from further skilled PT to improve pain, mobility, and strength ? ? ? ? ? ? ? ? ? ? ? ? ? ? ? ? ? PT Education - 10/31/21 1347   ? ? Education Details exercise technique, body mechanics   ? Person(s) Educated Patient   ? Methods Explanation;Demonstration;Tactile cues;Verbal cues   ? Comprehension Verbalized understanding;Returned demonstration;Verbal cues required;Tactile cues required   ? ?  ?  ? ?  ? ? ? PT Short Term Goals - 10/03/21 1719   ? ?  ? PT SHORT TERM GOAL #1  ? Title Patient will be independent in home exercise program to improve strength/mobility for better functional independence with ADLs.   ? Baseline 3/20: to be issued next 1-2 visits   ? Time 6   ? Period Weeks   ? Status New   ? Target Date 11/14/21   ?  ? PT SHORT TERM GOAL #2  ? Title Pt will report a worst pain of no greater than 3/10 within past two weeks to indicate improved QOL   ? Baseline 3/20: 8/10   ? Time 6   ? Period  Weeks   ? Status New   ? Target Date 11/14/21   ? ?  ?  ? ?  ? ? ? ? PT Long Term Goals - 10/10/21 1500   ? ?  ? PT LONG TERM GOAL #1  ? Title Patient will increase FOTO score by at least 10 points to demonstrate improvement in mobility and quality of life.   ? Baseline 3/20: 61   ? Time 12   ? Period Weeks   ? Status New   ? Target Date 12/26/21   ?  ?  PT LONG TERM GOAL #2  ? Title Patient (> 27 years old) will complete five times sit to stand test in <12 seconds indicating an increased LE strength and improved balance.   ? Baseline 3/20: 13.5 sec (previously 14.8 sec on 3/13)   ? Time 12   ? Period Weeks   ? Status New   ? Target Date 12/26/21   ?  ? PT LONG TERM GOAL #3  ? Title Patient will increase six minute walk test distance to >1000 for progression to community ambulator and improve gait ability   ? Baseline 3/20: deferred (previously 605 ft on 3/13)   ? Time 12   ? Period Weeks   ? Status New   ? Target Date 12/26/21   ?  ? PT LONG TERM GOAL #4  ? Title Patient will increase 10 meter walk test to >1.37ms as to improve gait speed for better community ambulation and to reduce fall risk.   ? Baseline 3/20: 0.64 m/s   ? Time 12   ? Period Weeks   ? Status New   ? Target Date 12/26/21   ?  ? PT LONG TERM GOAL #5  ? Title Patient will increase Berg Balance score by > 6 points to demonstrate decreased fall risk during functional activities.   ? Baseline 3/20: deferred; 3/27: 48/56   ? Time 12   ? Period Weeks   ? Status New   ? Target Date 12/26/21   ? ?  ?  ? ?  ? ? ? ? ? ? ? ? Plan - 10/31/21 1405   ? ? Clinical Impression Statement Patient presents with excellent motivation throughout physical therapy session. Patient is able to do short ambulation's with his dog. Cross body coordination is challenging for patient to perform with TrA activations. The pt will continue to benefit from further skilled PT to improve pain, mobility, and strength   ? Personal Factors and Comorbidities Comorbidity 3+;Age   ?  Comorbidities diabetes, hypertension, hyperlipidemia, neuropathy   ? Examination-Activity Limitations Locomotion Level;Lift;Transfers;Stand   ? Examination-Participation Restrictions Community Activity;Shop;Yard Work;Cleaning

## 2021-11-02 ENCOUNTER — Ambulatory Visit: Payer: Medicare Other

## 2021-11-02 DIAGNOSIS — R278 Other lack of coordination: Secondary | ICD-10-CM

## 2021-11-02 DIAGNOSIS — M25551 Pain in right hip: Secondary | ICD-10-CM

## 2021-11-02 DIAGNOSIS — M25552 Pain in left hip: Secondary | ICD-10-CM

## 2021-11-02 DIAGNOSIS — R2689 Other abnormalities of gait and mobility: Secondary | ICD-10-CM | POA: Diagnosis not present

## 2021-11-02 DIAGNOSIS — R269 Unspecified abnormalities of gait and mobility: Secondary | ICD-10-CM

## 2021-11-02 DIAGNOSIS — R2681 Unsteadiness on feet: Secondary | ICD-10-CM

## 2021-11-02 DIAGNOSIS — M6281 Muscle weakness (generalized): Secondary | ICD-10-CM

## 2021-11-02 NOTE — Therapy (Signed)
[facility] ?[place], [place], [address] ?Phone: [phone]   Fax:  [phone] ? ?Physical Therapy Treatment ? ?Patient Details  ?Name: Michael Cohen ?MRN: [id number] ?Date of Birth: 1941-05-18 ?Referring Provider (PT): Kirk Ruths, MD ? ? ?Encounter Date: 11/02/2021 ? ? PT End of Session - 11/02/21 1326   ? ? Visit Number 8   ? Number of Visits 25   ? Date for PT Re-Evaluation 12/26/21   ? Progress Note Due on Visit 10   ? PT Start Time 1345   ? PT Stop Time 1428   ? PT Time Calculation (min) 43 min   ? Equipment Utilized During Treatment Gait belt   ? Activity Tolerance Patient tolerated treatment well   ? Behavior During Therapy [facility] for tasks assessed/performed   ? ?  ?  ? ?  ? ? ?Past Medical History:  ?Diagnosis Date  ? Diabetes mellitus without complication ([place])   ? Hypercholesteremia   ? Hypertension   ? ? ?Past Surgical History:  ?Procedure Laterality Date  ? CATARACT EXTRACTION Bilateral   ? COLONOSCOPY WITH PROPOFOL N/A 12/12/2017  ? Procedure: COLONOSCOPY WITH PROPOFOL;  Surgeon: [name], [name], MD;  Location: [facility] ENDOSCOPY;  Service: Gastroenterology;  Laterality: N/A;  ? EYE SURGERY    ? ? ?There were no vitals filed for this visit. ? ? Subjective Assessment - 11/02/21 1325   ? ? Subjective Patient reports doing okay today with no new issues. Denies any falls and denies any pain today.   ? Pertinent History Pt is a 81 yo male who presents to PT with new referral for gait abnormality. Pt known to PT. Pt with c/o recurrence of R hip pain, in addition to R low back pain that radiates down to foot (pt originally seen in clinic for R hip pain, foot). He has noticed worsening "limp." Pt noted with increased antalgic gait pattern since recurrence of pain. Per pt, new onset started approximatley a month and a half ago. Pt with recent MRI on 09/27/21 that indicates ?prominent impingement at L4-5 and mild impingement L5-S1" (see  below for details). Pt currently taking advil for pain management. Pt has ?injection? for low back scheduled for the 31st of this month. Pt reports his pain fluctuates in intensity but is worst in the morning. Pain can also increase pt bending forward. He rates current pain as 3-4/10, worst pain as 8/10 an best pain as 2-3/10. 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 PMH is significant for DMII, hypercholesterimia, HLD, HTN, neuropathy.   ? Limitations Walking;House hold activities;Standing   ? How long can you sit comfortably? Few hours   ? How long can you stand comfortably? Limited first thing in morning due to increased R LBP/hip pain that radiates down RLE   ? How long can you walk comfortably? Pain onset first thing in morning, limited immediately, limited generally with long distances   ? Diagnostic tests 09/27/21: MR lumbar spine ?IMPRESSION:   1. Lumbar spondylosis and degenerative disc disease cause prominent   impingement at L4-5 and mild impingement at L5-S1. The dominant   finding is the L4-5 right lateral recess disc extrusion versus disc   fragment exerting mass effect on the right L5 nerves.   2. 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  "MR Hip R WO contrast 12/01/20: "IMPRESSION:  1. Mild symmetric degenerative spurring of both femoral heads and  acetabula. A specific cause for the patient's recent hip snap and  hip pain is not otherwise identified.  2. Sigmoid colon diverticulosis.";   ? Patient Stated Goals decrease pain, improve walking   ? Currently in Pain? No/denies   ? ?  ?  ? ?  ? ? ? ? ? ? ?Therapeutic Exercises:  ? ?Standing at support bar:  ?-SLS 30 seconds each LE x2 trials ?-squat with minimal UE support 10x  ?- lateral step ups x 15 reps each leg ?-Standing with 1 LE on edge of step - then contralateral LE hip hike x 12  reps- Increased difficulty raising right hip.  ?-Step taps without UE support x 15 reps each ?  ?  ? ?  ?Supine: ?  ?Tra contraction pressing into swiss ball with hands and knees 10x 3 second holds  ?TrA contraction with contralateral UE/LE raises 10x (Dying bug)  ?Green swiss ball hamstring curl 15x  ?Bridge 15x  ?Bridge 15x (Heels on green ball)  ? LE rotation 30 seconds x 2 (  ?  ?Sidleying:  ?-clamshell  20x  each LE with GTB (VC to keep feet together)  ?-hip abduction with straight leg 10x each LE  (VC to stay on side and not rock back)  ?  ?Pt educated throughout session about proper posture and technique with exercises. Improved exercise technique, movement at target joints, use of target muscles after min to mod verbal, visual, tactile cues.  ?  ?  ? ?  ?  ?  ?  ?  ?  ? ? ? ? ? ? ? ? ? ? ? ? ? ? ? ? ? ? ? ? ? ? PT Education - 11/03/21 1325   ? ? Education Details Exercise technique   ? Person(s) Educated Patient   ? Methods Explanation;Demonstration;Tactile cues;Verbal cues   ? Comprehension Verbalized understanding;Returned demonstration;Verbal cues required;Tactile cues required;Need further instruction   ? ?  ?  ? ?  ? ? ? PT Short Term Goals - 10/03/21 1719   ? ?  ? PT SHORT TERM GOAL #1  ? Title Patient will be independent in home exercise program to improve strength/mobility for better functional independence with ADLs.   ? Baseline 3/20: to be issued next 1-2 visits   ? Time 6   ? Period Weeks   ? Status New   ? Target Date 11/14/21   ?  ? PT SHORT TERM GOAL #2  ? Title Pt will report a worst pain of no greater than 3/10 within past two weeks to indicate improved QOL   ? Baseline 3/20: 8/10   ? Time 6   ? Period Weeks   ? Status New   ? Target Date 11/14/21   ? ?  ?  ? ?  ? ? ? ? PT Long Term Goals - 10/10/21 1500   ? ?  ? PT LONG TERM GOAL #1  ? Title Patient will increase FOTO score by at least 10 points to demonstrate improvement in mobility and quality of life.   ? Baseline 3/20: 61   ? Time 12    ? Period Weeks   ? Status New   ? Target Date 12/26/21   ?  ? PT LONG TERM GOAL #2  ? Title Patient (> 42 years old) will complete five times sit  to stand test in <12 seconds indicating an increased LE strength and improved balance.   ? Baseline 3/20: 13.5 sec (previously 14.8 sec on 3/13)   ? Time 12   ? Period Weeks   ? Status New   ? Target Date 12/26/21   ?  ? PT LONG TERM GOAL #3  ? Title Patient will increase six minute walk test distance to >1000 for progression to community ambulator and improve gait ability   ? Baseline 3/20: deferred (previously 605 ft on 3/13)   ? Time 12   ? Period Weeks   ? Status New   ? Target Date 12/26/21   ?  ? PT LONG TERM GOAL #4  ? Title Patient will increase 10 meter walk test to >1.55ms as to improve gait speed for better community ambulation and to reduce fall risk.   ? Baseline 3/20: 0.64 m/s   ? Time 12   ? Period Weeks   ? Status New   ? Target Date 12/26/21   ?  ? PT LONG TERM GOAL #5  ? Title Patient will increase Berg Balance score by > 6 points to demonstrate decreased fall risk during functional activities.   ? Baseline 3/20: deferred; 3/27: 48/56   ? Time 12   ? Period Weeks   ? Status New   ? Target Date 12/26/21   ? ?  ?  ? ?  ? ? ? ? ? ? ? ? Plan - 11/02/21 1326   ? ? Clinical Impression Statement Patient presents with good motivation for today's session. He was able to return demonstration with core exercises and later with LE strengthening. He was able to hip hike using QL today but significantly weaker on right side vs. Left. The pt will continue to benefit from further skilled PT to improve pain, mobility, and strength.   ? Personal Factors and Comorbidities Comorbidity 3+;Age   ? Comorbidities diabetes, hypertension, hyperlipidemia, neuropathy   ? Examination-Activity Limitations Locomotion Level;Lift;Transfers;Stand   ? Examination-Participation Restrictions Community Activity;Shop;Yard Work;Cleaning   ? Stability/Clinical Decision Making Evolving/Moderate  complexity   ? Rehab Potential Fair   ? PT Frequency 2x / week   ? PT Duration 12 weeks   ? PT Treatment/Interventions Moist Heat;Electrical Stimulation;Gait training;Stair training;Functional mobility t

## 2021-11-07 ENCOUNTER — Ambulatory Visit: Payer: Medicare Other

## 2021-11-07 DIAGNOSIS — R2689 Other abnormalities of gait and mobility: Secondary | ICD-10-CM

## 2021-11-07 DIAGNOSIS — M25551 Pain in right hip: Secondary | ICD-10-CM

## 2021-11-07 DIAGNOSIS — M6281 Muscle weakness (generalized): Secondary | ICD-10-CM

## 2021-11-07 DIAGNOSIS — R2681 Unsteadiness on feet: Secondary | ICD-10-CM

## 2021-11-07 NOTE — Therapy (Signed)
[facility] ?[place], [place], [address] ?Phone: [phone]   Fax:  [phone] ? ?Physical Therapy Treatment ? ?Patient Details  ?Name: Michael Cohen ?MRN: [id number] ?Date of Birth: [birth year] ?Referring Provider (PT): Kirk Ruths, MD ? ? ?Encounter Date: 11/07/2021 ? ? PT End of Session - 11/07/21 1304   ? ? Visit Number 9   ? Number of Visits 25   ? Date for PT Re-Evaluation 12/26/21   ? Progress Note Due on Visit 10   ? PT Start Time 1259   ? PT Stop Time 1344   ? PT Time Calculation (min) 45 min   ? Equipment Utilized During Treatment Gait belt   ? Activity Tolerance Patient tolerated treatment well   ? Behavior During Therapy [facility] for tasks assessed/performed   ? ?  ?  ? ?  ? ? ?Past Medical History:  ?Diagnosis Date  ? Diabetes mellitus without complication ([place])   ? Hypercholesteremia   ? Hypertension   ? ? ?Past Surgical History:  ?Procedure Laterality Date  ? CATARACT EXTRACTION Bilateral   ? COLONOSCOPY WITH PROPOFOL N/A 12/12/2017  ? Procedure: COLONOSCOPY WITH PROPOFOL;  Surgeon: [name], [name], MD;  Location: [facility];  Service: Gastroenterology;  Laterality: N/A;  ? EYE SURGERY    ? ? ?There were no vitals filed for this visit. ? ? Subjective Assessment - 11/07/21 1303   ? ? Subjective Patient reports having a good weekend, went to a musical. No pain reported.   ? Pertinent History Pt is a 81 yo male who presents to PT with new referral for gait abnormality. Pt known to PT. Pt with c/o recurrence of R hip pain, in addition to R low back pain that radiates down to foot (pt originally seen in clinic for R hip pain, foot). He has noticed worsening "limp." Pt noted with increased antalgic gait pattern since recurrence of pain. Per pt, new onset started approximatley a month and a half ago. Pt with recent MRI on 09/27/21 that indicates ?prominent impingement at L4-5 and mild impingement L5-S1" (see below for details).  Pt currently taking advil for pain management. Pt has ?injection? for low back scheduled for the 31st of this month. Pt reports his pain fluctuates in intensity but is worst in the morning. Pain can also increase pt bending forward. He rates current pain as 3-4/10, worst pain as 8/10 an best pain as 2-3/10. 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 PMH is significant for DMII, hypercholesterimia, HLD, HTN, neuropathy.   ? Limitations Walking;House hold activities;Standing   ? How long can you sit comfortably? Few hours   ? How long can you stand comfortably? Limited first thing in morning due to increased R LBP/hip pain that radiates down RLE   ? How long can you walk comfortably? Pain onset first thing in morning, limited immediately, limited generally with long distances   ? Diagnostic tests 09/27/21: MR lumbar spine ?IMPRESSION:   1. Lumbar spondylosis and degenerative disc disease cause prominent   impingement at L4-5 and mild impingement at L5-S1. The dominant   finding is the L4-5 right lateral recess disc extrusion versus disc   fragment exerting mass effect on the right L5 nerves.   2. 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  "  MR Hip R WO contrast 12/01/20: "IMPRESSION:  1. Mild symmetric degenerative spurring of both femoral heads and  acetabula. A specific cause for the patient's recent hip snap and  hip pain is not otherwise identified.  2. Sigmoid colon diverticulosis.";   ? Patient Stated Goals decrease pain, improve walking   ? Currently in Pain? No/denies   ? ?  ?  ? ?  ? ? ? ? ? ? ? ? ? ? ?Therapeutic Exercises:  ?  ?Nustep Lvl 4 seat position 8; cues for keeping SPM>60 for cardiovascular challenge x 4 minutes ?Standing at support bar:  ?-SLS 30 seconds each LE x2 trials ?-squat with minimal UE support 10x  ?-Step taps without UE support x  15 reps each ?-heel raises with UE support x15 reps  ?-step up/down with UE support x15reps   ?  ?  ?  ?Supine: ?  ?Tra contraction pressing into swiss ball with hands and knees 10x 3 second holds  ?TrA contraction with contralateral UE/LE raises 10x (Dead  bug)  ?Green swiss ball hamstring curl 15x  ?Bridge 15x; cues for control and stabilization to decrease eccentric "plop" ? ? LE rotation 30 seconds x 2 (  ?  ?Sidleying:  ?-clamshell  20x  each LE with GTB (VC to keep feet together)  ?-hip abduction with straight leg 10x each LE  (VC to stay on side and not rock back)  ?  ?Pt educated throughout session about proper posture and technique with exercises. Improved exercise technique, movement at target joints, use of target muscles after min to mod verbal, visual, tactile cues.  ? ? ? ?Patient has no pain throughout session. He has excellent motivation throughout session. LE strengthening tolerated well with occasional cues for stabilization and orientation. Cross body coordination continues to be challenging for patient at this time. The pt will continue to benefit from further skilled PT to improve pain, mobility, and strength. ? ? ? ? ? ? ? ? ? ? ? ? ? ? PT Education - 11/07/21 1304   ? ? Education Details exercise technique, body mechanics   ? Person(s) Educated Patient   ? Methods Explanation;Demonstration;Tactile cues;Verbal cues   ? Comprehension Verbalized understanding;Returned demonstration;Verbal cues required;Tactile cues required   ? ?  ?  ? ?  ? ? ? PT Short Term Goals - 10/03/21 1719   ? ?  ? PT SHORT TERM GOAL #1  ? Title Patient will be independent in home exercise program to improve strength/mobility for better functional independence with ADLs.   ? Baseline 3/20: to be issued next 1-2 visits   ? Time 6   ? Period Weeks   ? Status New   ? Target Date 11/14/21   ?  ? PT SHORT TERM GOAL #2  ? Title Pt will report a worst pain of no greater than 3/10 within past two weeks to indicate improved QOL   ?  Baseline 3/20: 8/10   ? Time 6   ? Period Weeks   ? Status New   ? Target Date 11/14/21   ? ?  ?  ? ?  ? ? ? ? PT Long Term Goals - 10/10/21 1500   ? ?  ? PT LONG TERM GOAL #1  ? Title Patient will increase FOTO score by at least 10 points to demonstrate improvement in mobility and quality of life.   ? Baseline 3/20: 61   ? Time 12   ? Period Weeks   ?  Status New   ? Target Date 12/26/21   ?  ? PT LONG TERM GOAL #2  ? Title Patient (> 43 years old) will complete five times sit to stand test in <12 seconds indicating an increased LE strength and improved balance.   ? Baseline 3/20: 13.5 sec (previously 14.8 sec on 3/13)   ? Time 12   ? Period Weeks   ? Status New   ? Target Date 12/26/21   ?  ? PT LONG TERM GOAL #3  ? Title Patient will increase six minute walk test distance to >1000 for progression to community ambulator and improve gait ability   ? Baseline 3/20: deferred (previously 605 ft on 3/13)   ? Time 12   ? Period Weeks   ? Status New   ? Target Date 12/26/21   ?  ? PT LONG TERM GOAL #4  ? Title Patient will increase 10 meter walk test to >1.32ms as to improve gait speed for better community ambulation and to reduce fall risk.   ? Baseline 3/20: 0.64 m/s   ? Time 12   ? Period Weeks   ? Status New   ? Target Date 12/26/21   ?  ? PT LONG TERM GOAL #5  ? Title Patient will increase Berg Balance score by > 6 points to demonstrate decreased fall risk during functional activities.   ? Baseline 3/20: deferred; 3/27: 48/56   ? Time 12   ? Period Weeks   ? Status New   ? Target Date 12/26/21   ? ?  ?  ? ?  ? ? ? ? ? ? ? ? Plan - 11/07/21 1315   ? ? Clinical Impression Statement Patient has no pain throughout session. He has excellent motivation throughout session. LE strengthening tolerated well with occasional cues for stabilization and orientation. Cross body coordination continues to be challenging for patient at this time. The pt will continue to benefit from further skilled PT to improve pain, mobility, and  strength.   ? Personal Factors and Comorbidities Comorbidity 3+;Age   ? Comorbidities diabetes, hypertension, hyperlipidemia, neuropathy   ? Examination-Activity Limitations Locomotion Level;Lift;Tran

## 2021-11-09 ENCOUNTER — Ambulatory Visit: Payer: Medicare Other | Admitting: Physical Therapy

## 2021-11-09 DIAGNOSIS — R2689 Other abnormalities of gait and mobility: Secondary | ICD-10-CM | POA: Diagnosis not present

## 2021-11-09 DIAGNOSIS — M25551 Pain in right hip: Secondary | ICD-10-CM

## 2021-11-09 DIAGNOSIS — R2681 Unsteadiness on feet: Secondary | ICD-10-CM

## 2021-11-09 DIAGNOSIS — M6281 Muscle weakness (generalized): Secondary | ICD-10-CM

## 2021-11-09 NOTE — Therapy (Signed)
[place] [facility] ?[place], [place], [address] ?Phone: [phone]   Fax:  [phone] ? ?Physical Therapy Treatment/ Physical Therapy Progress Note ? ? ?Dates of reporting period  10/03/21   to   11/09/21 ? ? ?Patient Details  ?Name: Michael Cohen ?MRN: [id number] ?Date of Birth: August 05, 1940 ?Referring Provider (PT): Kirk Ruths, MD ? ? ?Encounter Date: 11/09/2021 ? ? PT End of Session - 11/09/21 1438   ? ? Visit Number 10   ? Number of Visits 25   ? Date for PT Re-Evaluation 12/26/21   ? Progress Note Due on Visit 10   ? PT Start Time 1430   ? PT Stop Time 1513   ? PT Time Calculation (min) 43 min   ? Equipment Utilized During Treatment Gait belt   ? Activity Tolerance Patient tolerated treatment well   ? Behavior During Therapy [facility] for tasks assessed/performed   ? ?  ?  ? ?  ? ? ?Past Medical History:  ?Diagnosis Date  ? Diabetes mellitus without complication ([place])   ? Hypercholesteremia   ? Hypertension   ? ? ?Past Surgical History:  ?Procedure Laterality Date  ? CATARACT EXTRACTION Bilateral   ? COLONOSCOPY WITH PROPOFOL N/A 12/12/2017  ? Procedure: COLONOSCOPY WITH PROPOFOL;  Surgeon: [name], [name], MD;  Location: [facility] ENDOSCOPY;  Service: Gastroenterology;  Laterality: N/A;  ? EYE SURGERY    ? ? ?There were no vitals filed for this visit. ? ? Subjective Assessment - 11/09/21 1434   ? ? Subjective Pt reports he is doing well and is not having pain from the pinched nerve at this time.   ? Pertinent History Pt is a 81 yo male who presents to PT with new referral for gait abnormality. Pt known to PT. Pt with c/o recurrence of R hip pain, in addition to R low back pain that radiates down to foot (pt originally seen in clinic for R hip pain, foot). He has noticed worsening "limp." Pt noted with increased antalgic gait pattern since recurrence of pain. Per pt, new onset started approximatley a month and a half ago. Pt with recent MRI on  09/27/21 that indicates ?prominent impingement at L4-5 and mild impingement L5-S1" (see below for details). Pt currently taking advil for pain management. Pt has ?injection? for low back scheduled for the 31st of this month. Pt reports his pain fluctuates in intensity but is worst in the morning. Pain can also increase pt bending forward. He rates current pain as 3-4/10, worst pain as 8/10 an best pain as 2-3/10. 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 PMH is significant for DMII, hypercholesterimia, HLD, HTN, neuropathy.   ? Limitations Walking;House hold activities;Standing   ? How long can you sit comfortably? Few hours   ? How long can you stand comfortably? Limited first thing in morning due to increased R LBP/hip pain that radiates down RLE   ? How long can you walk comfortably? Pain onset first thing in morning, limited immediately, limited generally with long distances   ? Diagnostic tests 09/27/21: MR lumbar spine ?IMPRESSION:   1. Lumbar spondylosis and degenerative disc disease cause prominent   impingement at L4-5 and mild impingement at L5-S1. The dominant   finding is the L4-5 right lateral recess disc extrusion versus disc   fragment exerting mass effect on the right L5 nerves.  2. 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  "MR Hip R WO contrast 12/01/20: "IMPRESSION:  1. Mild symmetric degenerative spurring of both femoral heads and  acetabula. A specific cause for the patient's recent hip snap and  hip pain is not otherwise identified.  2. Sigmoid colon diverticulosis.";   ? Patient Stated Goals decrease pain, improve walking   ? ?  ?  ? ?  ? ? ?Physical therapy treatment session today consisted of completing assessment of goals and administration of testing as demonstrated in flow sheet. Addition treatments may be found below.   ? ? ?Therapeutic Exercises:  ?  ?Nustep Lvl 4 seat position 8; cues for keeping SPM>60 for cardiovascular challenge x 4 minutes ? ?700 feet on 6MWT, slowed gait speed following 3 minutes.  Slowed gait speed following several minutes of ambulation but able to continue with test. ?-Decreased step length noted as patient demonstrated fatigue. ? ?0.71 m/s with 10 m walk test ? ?FOTO 58 - decrease since IE from 61  ? ? ?48 on BERG, see flowsheets for further details improved on NBOS and SLS but lost point on weight shifting.  ? ? ? ? ? ? ? ? ? ? ? ? ? ? ? ? ? ? ? ? ? ? ? ? ? PT Education - 11/09/21 1437   ? ? Education Details exercise technique, body mechanics   ? Person(s) Educated Patient   ? Methods Explanation;Demonstration;Tactile cues;Verbal cues   ? Comprehension Verbalized understanding;Returned demonstration;Verbal cues required   ? ?  ?  ? ?  ? ? ? PT Short Term Goals - 11/09/21 1440   ? ?  ? PT SHORT TERM GOAL #1  ? Title Patient will be independent in home exercise program to improve strength/mobility for better functional independence with ADLs.   ? Baseline patient is compliant with all his HEP; attending water aerobics on days he's not coming to therapy.   ? Time 4   ? Period Weeks   ? Status Achieved   ? Target Date 08/03/20   ?  ? PT SHORT TERM GOAL #2  ? Title Patient will increase RLE gross strength to 4+/5 as to improve functional strength for independent gait, increased standing tolerance and increased ADL ability.   ? Baseline 12/21: RLE gross strength 4/5 2/23: see note; 4/11: grossly 5/5 BLEs; 7/18: grossly 5/5 BLEs   ? Time 4   ? Period Weeks   ? Status Achieved   ? Target Date 08/03/20   ? ?  ?  ? ?  ? ? ? ? PT Long Term Goals - 11/09/21 1442   ? ?  ? PT LONG TERM GOAL #1  ? Title Patient will increase FOTO score by at least 10 points to demonstrate improvement in mobility and quality of life.   ? Baseline 3/20: 61 4/26: 58   ? Time 12   ? Period Weeks   ? Status On-going   ? Target Date  12/26/21   ?  ? PT LONG TERM GOAL #2  ? Title Patient (> 5 years old) will complete five times sit to stand test in <12 seconds indicating an increased LE strength and improved balance.   ? Baseline 3/20: 13.5 sec (previously 14.8 sec on 3/13) 4/26 11.36 sec but used UE support on rep 1  on chair arms and in knees for remainder of reps)   ?  Time 12   ? Period Weeks   ? Status Partially Met   ? Target Date 12/26/21   ?  ? PT LONG TERM GOAL #3  ? Title Patient will increase six minute walk test distance to >1000 for progression to community ambulator and improve gait ability   ? Baseline 3/20: deferred (previously 605 ft on 3/13)   ? Time 12   ? Period Weeks   ? Status On-going   ? Target Date 12/26/21   ?  ? PT LONG TERM GOAL #4  ? Title Patient will increase 10 meter walk test to >1.57ms as to improve gait speed for better community ambulation and to reduce fall risk.   ? Baseline 3/20: 0.64 m/s 4/26: .71 m/s   ? Time 12   ? Period Weeks   ? Status On-going   ? Target Date 12/26/21   ?  ? PT LONG TERM GOAL #5  ? Title Patient will increase Berg Balance score by > 6 points to demonstrate decreased fall risk during functional activities.   ? Baseline 3/20: deferred; 3/27: 48/56   ? Time 12   ? Period Weeks   ? Status On-going   ? Target Date 12/26/21   ? ?  ?  ? ?  ? ? ? ? ? ? ? ? Plan - 11/09/21 1438   ? ? Clinical Impression Statement Pt presents to PT for progress note. Pt patient has made improvements in his 6-minute walk test, 5 times sit to stand, and 10 m walk test indicating improved ambulatory capacity, lower extremity strength, and decreased risk of falls.  Patient did maintain the same score on his Berg balance test we did make improvements in single-leg stance stability as well as standing with narrow base of support.  Patient also made significant improvements in his levels of pain and he has not had significant pain radiating down his leg for the last several weeks.  Patient does continue to demonstrate  gait impairments as well as some fatigue and decrease in pace during 6-minute walk test is still at risk of falls based on his Berg balance scores.  Patient will continue to benefit from skilled physical therapy in [place]

## 2021-11-10 ENCOUNTER — Encounter: Payer: Self-pay | Admitting: Physical Therapy

## 2021-11-14 ENCOUNTER — Ambulatory Visit: Payer: Medicare Other | Attending: Internal Medicine

## 2021-11-14 DIAGNOSIS — S066XAA Traumatic subarachnoid hemorrhage with loss of consciousness status unknown, initial encounter: Secondary | ICD-10-CM | POA: Diagnosis not present

## 2021-11-14 DIAGNOSIS — R4182 Altered mental status, unspecified: Secondary | ICD-10-CM | POA: Diagnosis not present

## 2021-11-14 DIAGNOSIS — M25551 Pain in right hip: Secondary | ICD-10-CM | POA: Insufficient documentation

## 2021-11-14 DIAGNOSIS — R2681 Unsteadiness on feet: Secondary | ICD-10-CM | POA: Insufficient documentation

## 2021-11-14 DIAGNOSIS — R2689 Other abnormalities of gait and mobility: Secondary | ICD-10-CM | POA: Insufficient documentation

## 2021-11-14 DIAGNOSIS — M6281 Muscle weakness (generalized): Secondary | ICD-10-CM | POA: Insufficient documentation

## 2021-11-14 NOTE — Therapy (Signed)
[place] [facility] ?[place], [place], [address] ?Phone: [phone]   Fax:  [phone] ? ?Physical Therapy Treatment ? ?Patient Details  ?Name: Michael Cohen ?MRN: [id number] ?Date of Birth: 01/08/1941 ?Referring Provider (PT): Kirk Ruths, MD ? ? ?Encounter Date: 11/14/2021 ? ? PT End of Session - 11/14/21 1343   ? ? Visit Number 11   ? Number of Visits 25   ? Date for PT Re-Evaluation 12/26/21   ? Progress Note Due on Visit 10   ? PT Start Time 1345   ? PT Stop Time 5465   ? PT Time Calculation (min) 44 min   ? Equipment Utilized During Treatment Gait belt   ? Activity Tolerance Patient tolerated treatment well   ? Behavior During Therapy [facility] for tasks assessed/performed   ? ?  ?  ? ?  ? ? ?Past Medical History:  ?Diagnosis Date  ? Diabetes mellitus without complication ([place])   ? Hypercholesteremia   ? Hypertension   ? ? ?Past Surgical History:  ?Procedure Laterality Date  ? CATARACT EXTRACTION Bilateral   ? COLONOSCOPY WITH PROPOFOL N/A 12/12/2017  ? Procedure: COLONOSCOPY WITH PROPOFOL;  Surgeon: [name], [name], MD;  Location: [facility] ENDOSCOPY;  Service: Gastroenterology;  Laterality: N/A;  ? EYE SURGERY    ? ? ?There were no vitals filed for this visit. ? ? Subjective Assessment - 11/14/21 1349   ? ? Subjective Patient reports no falls or LOB since last session. Reports overall his mobility is at a low level even though he doesn't have pain.   ? Pertinent History Pt is a 81 yo male who presents to PT with new referral for gait abnormality. Pt known to PT. Pt with c/o recurrence of R hip pain, in addition to R low back pain that radiates down to foot (pt originally seen in clinic for R hip pain, foot). He has noticed worsening "limp." Pt noted with increased antalgic gait pattern since recurrence of pain. Per pt, new onset started approximatley a month and a half ago. Pt with recent MRI on 09/27/21 that indicates ?prominent impingement at  L4-5 and mild impingement L5-S1" (see below for details). Pt currently taking advil for pain management. Pt has ?injection? for low back scheduled for the 31st of this month. Pt reports his pain fluctuates in intensity but is worst in the morning. Pain can also increase pt bending forward. He rates current pain as 3-4/10, worst pain as 8/10 an best pain as 2-3/10. 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 PMH is significant for DMII, hypercholesterimia, HLD, HTN, neuropathy.   ? Limitations Walking;House hold activities;Standing   ? How long can you sit comfortably? Few hours   ? How long can you stand comfortably? Limited first thing in morning due to increased R LBP/hip pain that radiates down RLE   ? How long can you walk comfortably? Pain onset first thing in morning, limited immediately, limited generally with long distances   ? Diagnostic tests 09/27/21: MR lumbar spine ?IMPRESSION:   1. Lumbar spondylosis and degenerative disc disease cause prominent   impingement at L4-5 and mild impingement at L5-S1. The dominant   finding is the L4-5 right lateral recess disc extrusion versus disc   fragment exerting mass effect on the right L5 nerves.   2. Chronic 50% compression fracture T12 with 4 mm of chronic  bony   retropulsion but no impingement at this level.?  "MR Hip R WO contrast 12/01/20: "IMPRESSION:  1. Mild symmetric degenerative spurring of both femoral heads and  acetabula. A specific cause for the patient's recent hip snap and  hip pain is not otherwise identified.  2. Sigmoid colon diverticulosis.";   ? Patient Stated Goals decrease pain, improve walking   ? Currently in Pain? No/denies   ? ?  ?  ? ?  ? ? ? ? ? ? ?  ?Therapeutic Exercises:  ?  ?Nustep Lvl 5 seat position 8; cues for keeping SPM>60 for cardiovascular challenge x 4 minutes ? ?Standing at support bar:  ?-SLS 30 seconds each LE x2 trials ?-squat with  minimal UE support 10x  ?-4 way hip raise 10x each LE with UE support ?-6" Step taps without UE support x 15 reps each ?-heel raises with UE support x15 reps  ?-6" step up/down with UE support x15reps each LE  ?-6" step up/down lateral up/down x15 reps each LE   ?-lateral modified lunge x10 each LE with BUE support  ?  ?Sit to stand with weighted ball (2000 gr) 10x  ? ? ?Pt educated throughout session about proper posture and technique with exercises. Improved exercise technique, movement at target joints, use of target muscles after min to mod verbal, visual, tactile cues. ? ?Patient presents to physical therapy with excellent motivation. He does have fatigue with intermittent need for seated rest breaks. Occasional foot drag/hip drop noted with fatigue. The pt will continue to benefit from further skilled PT to improve pain, mobility, and strength. ? ? ? ? ? ? ? ? ? ? ? ? ? ? ? ? ? ? ? ? PT Education - 11/14/21 1343   ? ? Education Details exercise technique, body mechanics   ? Person(s) Educated Patient   ? Methods Explanation;Demonstration;Tactile cues;Verbal cues   ? Comprehension Verbalized understanding;Returned demonstration;Verbal cues required;Tactile cues required   ? ?  ?  ? ?  ? ? ? PT Short Term Goals - 11/09/21 1440   ? ?  ? PT SHORT TERM GOAL #1  ? Title Patient will be independent in home exercise program to improve strength/mobility for better functional independence with ADLs.   ? Baseline patient is compliant with all his HEP; attending water aerobics on days he's not coming to therapy.   ? Time 4   ? Period Weeks   ? Status Achieved   ? Target Date 08/03/20   ?  ? PT SHORT TERM GOAL #2  ? Title Patient will increase RLE gross strength to 4+/5 as to improve functional strength for independent gait, increased standing tolerance and increased ADL ability.   ? Baseline 12/21: RLE gross strength 4/5 2/23: see note; 4/11: grossly 5/5 BLEs; 7/18: grossly 5/5 BLEs   ? Time 4   ? Period Weeks   ? Status  Achieved   ? Target Date 08/03/20   ? ?  ?  ? ?  ? ? ? ? PT Long Term Goals - 11/09/21 1442   ? ?  ? PT LONG TERM GOAL #1  ? Title Patient will increase FOTO score by at least 10 points to demonstrate improvement in mobility and quality of life.   ? Baseline 3/20: 61 4/26: 58   ? Time 12   ? Period Weeks   ? Status On-going   ? Target Date 12/26/21   ?  ? PT LONG TERM GOAL #2  ?  Title Patient (> 34 years old) will complete five times sit to stand test in <12 seconds indicating an increased LE strength and improved balance.   ? Baseline 3/20: 13.5 sec (previously 14.8 sec on 3/13) 4/26 11.36 sec but used UE support on rep 1  on chair arms and in knees for remainder of reps)   ? Time 12   ? Period Weeks   ? Status Partially Met   ? Target Date 12/26/21   ?  ? PT LONG TERM GOAL #3  ? Title Patient will increase six minute walk test distance to >1000 for progression to community ambulator and improve gait ability   ? Baseline 3/20: deferred (previously 605 ft on 3/13)   ? Time 12   ? Period Weeks   ? Status On-going   ? Target Date 12/26/21   ?  ? PT LONG TERM GOAL #4  ? Title Patient will increase 10 meter walk test to >1.9ms as to improve gait speed for better community ambulation and to reduce fall risk.   ? Baseline 3/20: 0.64 m/s 4/26: .71 m/s   ? Time 12   ? Period Weeks   ? Status On-going   ? Target Date 12/26/21   ?  ? PT LONG TERM GOAL #5  ? Title Patient will increase Berg Balance score by > 6 points to demonstrate decreased fall risk during functional activities.   ? Baseline 3/20: deferred; 3/27: 48/56   ? Time 12   ? Period Weeks   ? Status On-going   ? Target Date 12/26/21   ? ?  ?  ? ?  ? ? ? ? ? ? ? ? Plan - 11/14/21 1417   ? ? Clinical Impression Statement Patient presents to physical therapy with excellent motivation. He does have fatigue with intermittent need for seated rest breaks. Occasional foot drag/hip drop noted with fatigue. The pt will continue to benefit from further skilled PT to improve  pain, mobility, and strength.   ? Personal Factors and Comorbidities Comorbidity 3+;Age   ? Comorbidities diabetes, hypertension, hyperlipidemia, neuropathy   ? Examination-Activity Limitations [place]

## 2021-11-16 ENCOUNTER — Other Ambulatory Visit: Payer: Self-pay

## 2021-11-16 ENCOUNTER — Inpatient Hospital Stay
Admission: EM | Admit: 2021-11-16 | Discharge: 2021-12-15 | DRG: 023 | Disposition: E | Payer: Medicare Other | Attending: Internal Medicine | Admitting: Internal Medicine

## 2021-11-16 ENCOUNTER — Ambulatory Visit: Payer: Medicare Other

## 2021-11-16 DIAGNOSIS — Z7982 Long term (current) use of aspirin: Secondary | ICD-10-CM

## 2021-11-16 DIAGNOSIS — R402233 Coma scale, best verbal response, inappropriate words, at hospital admission: Secondary | ICD-10-CM | POA: Diagnosis present

## 2021-11-16 DIAGNOSIS — M6281 Muscle weakness (generalized): Secondary | ICD-10-CM

## 2021-11-16 DIAGNOSIS — Z9842 Cataract extraction status, left eye: Secondary | ICD-10-CM

## 2021-11-16 DIAGNOSIS — Z66 Do not resuscitate: Secondary | ICD-10-CM | POA: Diagnosis present

## 2021-11-16 DIAGNOSIS — G935 Compression of brain: Secondary | ICD-10-CM | POA: Diagnosis not present

## 2021-11-16 DIAGNOSIS — F10129 Alcohol abuse with intoxication, unspecified: Secondary | ICD-10-CM | POA: Diagnosis present

## 2021-11-16 DIAGNOSIS — S066XAA Traumatic subarachnoid hemorrhage with loss of consciousness status unknown, initial encounter: Principal | ICD-10-CM | POA: Diagnosis present

## 2021-11-16 DIAGNOSIS — R4182 Altered mental status, unspecified: Secondary | ICD-10-CM

## 2021-11-16 DIAGNOSIS — R402113 Coma scale, eyes open, never, at hospital admission: Secondary | ICD-10-CM | POA: Diagnosis present

## 2021-11-16 DIAGNOSIS — I4891 Unspecified atrial fibrillation: Secondary | ICD-10-CM | POA: Diagnosis present

## 2021-11-16 DIAGNOSIS — E781 Pure hyperglyceridemia: Secondary | ICD-10-CM | POA: Diagnosis not present

## 2021-11-16 DIAGNOSIS — G8191 Hemiplegia, unspecified affecting right dominant side: Secondary | ICD-10-CM | POA: Diagnosis present

## 2021-11-16 DIAGNOSIS — G9341 Metabolic encephalopathy: Secondary | ICD-10-CM | POA: Diagnosis present

## 2021-11-16 DIAGNOSIS — Z9841 Cataract extraction status, right eye: Secondary | ICD-10-CM

## 2021-11-16 DIAGNOSIS — E870 Hyperosmolality and hypernatremia: Secondary | ICD-10-CM | POA: Diagnosis not present

## 2021-11-16 DIAGNOSIS — R2681 Unsteadiness on feet: Secondary | ICD-10-CM

## 2021-11-16 DIAGNOSIS — Z20822 Contact with and (suspected) exposure to covid-19: Secondary | ICD-10-CM | POA: Diagnosis present

## 2021-11-16 DIAGNOSIS — B9689 Other specified bacterial agents as the cause of diseases classified elsewhere: Secondary | ICD-10-CM | POA: Diagnosis present

## 2021-11-16 DIAGNOSIS — Y92008 Other place in unspecified non-institutional (private) residence as the place of occurrence of the external cause: Secondary | ICD-10-CM

## 2021-11-16 DIAGNOSIS — Y906 Blood alcohol level of 120-199 mg/100 ml: Secondary | ICD-10-CM | POA: Diagnosis present

## 2021-11-16 DIAGNOSIS — F10929 Alcohol use, unspecified with intoxication, unspecified: Secondary | ICD-10-CM

## 2021-11-16 DIAGNOSIS — F10139 Alcohol abuse with withdrawal, unspecified: Secondary | ICD-10-CM | POA: Diagnosis not present

## 2021-11-16 DIAGNOSIS — R402353 Coma scale, best motor response, localizes pain, at hospital admission: Secondary | ICD-10-CM | POA: Diagnosis present

## 2021-11-16 DIAGNOSIS — J9602 Acute respiratory failure with hypercapnia: Secondary | ICD-10-CM | POA: Diagnosis not present

## 2021-11-16 DIAGNOSIS — E1140 Type 2 diabetes mellitus with diabetic neuropathy, unspecified: Secondary | ICD-10-CM | POA: Diagnosis present

## 2021-11-16 DIAGNOSIS — I440 Atrioventricular block, first degree: Secondary | ICD-10-CM | POA: Diagnosis present

## 2021-11-16 DIAGNOSIS — Z515 Encounter for palliative care: Secondary | ICD-10-CM

## 2021-11-16 DIAGNOSIS — R2689 Other abnormalities of gait and mobility: Secondary | ICD-10-CM

## 2021-11-16 DIAGNOSIS — I609 Nontraumatic subarachnoid hemorrhage, unspecified: Secondary | ICD-10-CM

## 2021-11-16 DIAGNOSIS — E538 Deficiency of other specified B group vitamins: Secondary | ICD-10-CM | POA: Diagnosis present

## 2021-11-16 DIAGNOSIS — Z23 Encounter for immunization: Secondary | ICD-10-CM

## 2021-11-16 DIAGNOSIS — Y92009 Unspecified place in unspecified non-institutional (private) residence as the place of occurrence of the external cause: Secondary | ICD-10-CM

## 2021-11-16 DIAGNOSIS — E1165 Type 2 diabetes mellitus with hyperglycemia: Secondary | ICD-10-CM | POA: Diagnosis present

## 2021-11-16 DIAGNOSIS — E119 Type 2 diabetes mellitus without complications: Secondary | ICD-10-CM

## 2021-11-16 DIAGNOSIS — G8321 Monoplegia of upper limb affecting right dominant side: Secondary | ICD-10-CM | POA: Diagnosis present

## 2021-11-16 DIAGNOSIS — Z7984 Long term (current) use of oral hypoglycemic drugs: Secondary | ICD-10-CM

## 2021-11-16 DIAGNOSIS — A419 Sepsis, unspecified organism: Secondary | ICD-10-CM | POA: Diagnosis not present

## 2021-11-16 DIAGNOSIS — S065XAA Traumatic subdural hemorrhage with loss of consciousness status unknown, initial encounter: Secondary | ICD-10-CM | POA: Diagnosis not present

## 2021-11-16 DIAGNOSIS — R296 Repeated falls: Principal | ICD-10-CM

## 2021-11-16 DIAGNOSIS — E7800 Pure hypercholesterolemia, unspecified: Secondary | ICD-10-CM | POA: Diagnosis present

## 2021-11-16 DIAGNOSIS — I480 Paroxysmal atrial fibrillation: Secondary | ICD-10-CM | POA: Diagnosis not present

## 2021-11-16 DIAGNOSIS — I10 Essential (primary) hypertension: Secondary | ICD-10-CM | POA: Diagnosis present

## 2021-11-16 DIAGNOSIS — J9811 Atelectasis: Secondary | ICD-10-CM | POA: Diagnosis not present

## 2021-11-16 DIAGNOSIS — M25551 Pain in right hip: Secondary | ICD-10-CM

## 2021-11-16 DIAGNOSIS — J9601 Acute respiratory failure with hypoxia: Secondary | ICD-10-CM | POA: Diagnosis not present

## 2021-11-16 DIAGNOSIS — E876 Hypokalemia: Secondary | ICD-10-CM | POA: Diagnosis present

## 2021-11-16 DIAGNOSIS — W1830XA Fall on same level, unspecified, initial encounter: Secondary | ICD-10-CM | POA: Diagnosis present

## 2021-11-16 DIAGNOSIS — S061XAA Traumatic cerebral edema with loss of consciousness status unknown, initial encounter: Secondary | ICD-10-CM | POA: Diagnosis present

## 2021-11-16 DIAGNOSIS — J690 Pneumonitis due to inhalation of food and vomit: Secondary | ICD-10-CM | POA: Diagnosis not present

## 2021-11-16 DIAGNOSIS — Z79899 Other long term (current) drug therapy: Secondary | ICD-10-CM

## 2021-11-16 DIAGNOSIS — W19XXXA Unspecified fall, initial encounter: Secondary | ICD-10-CM

## 2021-11-16 DIAGNOSIS — J158 Pneumonia due to other specified bacteria: Secondary | ICD-10-CM | POA: Diagnosis not present

## 2021-11-16 DIAGNOSIS — G913 Post-traumatic hydrocephalus, unspecified: Secondary | ICD-10-CM | POA: Diagnosis not present

## 2021-11-16 DIAGNOSIS — Z87891 Personal history of nicotine dependence: Secondary | ICD-10-CM

## 2021-11-16 DIAGNOSIS — S0001XA Abrasion of scalp, initial encounter: Secondary | ICD-10-CM | POA: Diagnosis present

## 2021-11-16 DIAGNOSIS — I4510 Unspecified right bundle-branch block: Secondary | ICD-10-CM | POA: Diagnosis present

## 2021-11-16 DIAGNOSIS — K219 Gastro-esophageal reflux disease without esophagitis: Secondary | ICD-10-CM | POA: Diagnosis present

## 2021-11-16 MED ORDER — SODIUM CHLORIDE 0.9 % IV BOLUS (SEPSIS)
1000.0000 mL | Freq: Once | INTRAVENOUS | Status: AC
  Administered 2021-11-17: 1000 mL via INTRAVENOUS

## 2021-11-16 MED ORDER — TETANUS-DIPHTH-ACELL PERTUSSIS 5-2.5-18.5 LF-MCG/0.5 IM SUSY
0.5000 mL | PREFILLED_SYRINGE | Freq: Once | INTRAMUSCULAR | Status: AC
  Administered 2021-11-17: 0.5 mL via INTRAMUSCULAR
  Filled 2021-11-16: qty 0.5

## 2021-11-16 NOTE — ED Triage Notes (Signed)
Per EMS, pt coming, from [place] who lives with wife. Per wife, pt was found to have fallen in hallway and hit head. Hematoma noted to back of head. No blood thinners. Per wife pt is altered. Pt normally alert and oriented x4. Pt able to tell me name and birthday but unaware of all other information. Pt also admits to possible ETOH use.  ?

## 2021-11-16 NOTE — Therapy (Signed)
[facility] ?[place], [place], [address] ?Phone: [phone]   Fax:  [phone] ? ?Physical Therapy Treatment ? ?Patient Details  ?Name: Michael Cohen ?MRN: [id number] ?Date of Birth: 1940-12-16 ?Referring Provider (PT): Kirk Ruths, MD ? ? ?Encounter Date: 11/27/2021 ? ? PT End of Session - 11/21/2021 1353   ? ? Visit Number 12   ? Number of Visits 25   ? Date for PT Re-Evaluation 12/26/21   ? Progress Note Due on Visit 10   ? PT Start Time 1345   ? PT Stop Time 6962   ? PT Time Calculation (min) 44 min   ? Equipment Utilized During Treatment Gait belt   ? Activity Tolerance Patient tolerated treatment well   ? Behavior During Therapy [facility] for tasks assessed/performed   ? ?  ?  ? ?  ? ? ?Past Medical History:  ?Diagnosis Date  ? Diabetes mellitus without complication ([place])   ? Hypercholesteremia   ? Hypertension   ? ? ?Past Surgical History:  ?Procedure Laterality Date  ? CATARACT EXTRACTION Bilateral   ? COLONOSCOPY WITH PROPOFOL N/A 12/12/2017  ? Procedure: COLONOSCOPY WITH PROPOFOL;  Surgeon: [name], [name], MD;  Location: [facility] ENDOSCOPY;  Service: Gastroenterology;  Laterality: N/A;  ? EYE SURGERY    ? ? ?There were no vitals filed for this visit. ? ? Subjective Assessment - 12/12/2021 1352   ? ? Subjective Patient reports no falls or LOB. Is going to a therapy event with his dog on Sunday.   ? Pertinent History Pt is a 81 yo male who presents to PT with new referral for gait abnormality. Pt known to PT. Pt with c/o recurrence of R hip pain, in addition to R low back pain that radiates down to foot (pt originally seen in clinic for R hip pain, foot). He has noticed worsening "limp." Pt noted with increased antalgic gait pattern since recurrence of pain. Per pt, new onset started approximatley a month and a half ago. Pt with recent MRI on 09/27/21 that indicates ?prominent impingement at L4-5 and mild impingement L5-S1" (see below for  details). Pt currently taking advil for pain management. Pt has ?injection? for low back scheduled for the 31st of this month. Pt reports his pain fluctuates in intensity but is worst in the morning. Pain can also increase pt bending forward. He rates current pain as 3-4/10, worst pain as 8/10 an best pain as 2-3/10. 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 PMH is significant for DMII, hypercholesterimia, HLD, HTN, neuropathy.   ? Limitations Walking;House hold activities;Standing   ? How long can you sit comfortably? Few hours   ? How long can you stand comfortably? Limited first thing in morning due to increased R LBP/hip pain that radiates down RLE   ? How long can you walk comfortably? Pain onset first thing in morning, limited immediately, limited generally with long distances   ? Diagnostic tests 09/27/21: MR lumbar spine ?IMPRESSION:   1. Lumbar spondylosis and degenerative disc disease cause prominent   impingement at L4-5 and mild impingement at L5-S1. The dominant   finding is the L4-5 right lateral recess disc extrusion versus disc   fragment exerting mass effect on the right L5 nerves.   2. 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  "MR Hip R WO contrast 12/01/20: "IMPRESSION:  1. Mild symmetric degenerative spurring of both femoral heads and  acetabula. A specific cause for the patient's recent hip snap and  hip pain is not otherwise identified.  2. Sigmoid colon diverticulosis.";   ? Patient Stated Goals decrease pain, improve walking   ? Currently in Pain? No/denies   ? ?  ?  ? ?  ? ? ? ? ? ? ? ? ? ?Therapeutic Exercises:  ?  ?Nustep Lvl 5 seat position 8; cues for keeping SPM>60 for cardiovascular challenge x 4 minutes ?  ?Standing at support bar:  ?-6" step up/down with UE support x15reps each LE  ?-lateral modified lunge x10 each LE with  BUE support  ?  ?Sit to stand with weighted ball (2000 gr) 10x  ?  ?Standing with # 4lb ankle weight: CGA for stability ? ?-Hip extension with B upper extremity support, cueing for neutral hip alignment, upright posture for optimal muscle recruitment, and sequencing, 10x each LE,  ?-Hip abduction with B upper extremity support, cueing for neutral foot alignment for correct muscle activation, 10x each LE ?-Hip flexion with B upper extremity support, cueing for body mechanics, speed of muscle recruitment for optimal strengthening and stabilization 10x each LE ?-Hamstring curl with B upper extremity support, cueing for knee alignment for recruitment of hamstring musculature, 10x each LE ? ? ?Seated with # 4 ankle weights  ?-Seated marches with upright posture, back away from back of chair for abdominal/trunk activation/stabilization, 10x each LE ?-Seated LAQ with 3 second holds, 10x each LE, cueing for muscle activation and sequencing for neutral alignment ?-Seated IR/ER with cueing for stabilizing knee placement with lateral foot movement for optimal muscle recruitment, 10x each LE ?-heel raise 15x  ?  ?STS 10x  ? ?Pt educated throughout session about proper posture and technique with exercises. Improved exercise technique, movement at target joints, use of target muscles after min to mod verbal, visual, tactile cues ? ? ? ? ?Patient presents with excellent motivation throughout physical therapy session. He tolerates increased resistance of ankle weights with no pain increase. Limp continues to be present before and after session. The pt will continue to benefit from further skilled PT to improve pain, mobility, and strength. ? ? ? ? ? ? ? ? ? ? ? ? ? ? PT Education - 11/27/2021 1353   ? ? Education Details exercise technique, body mechanics   ? Person(s) Educated Patient   ? Methods Explanation;Demonstration;Tactile cues;Verbal cues   ? Comprehension Verbalized understanding;Returned demonstration;Verbal cues  required;Tactile cues required   ? ?  ?  ? ?  ? ? ? PT Short Term Goals - 11/09/21 1440   ? ?  ? PT SHORT TERM GOAL #1  ? Title Patient will be independent in home exercise program to improve strength/mobility for better functional independence with ADLs.   ? Baseline patient is compliant with all his HEP; attending water aerobics on days he's not coming to therapy.   ? Time 4   ? Period Weeks   ? Status Achieved   ? Target Date 08/03/20   ?  ? PT SHORT TERM GOAL #2  ? Title Patient will increase RLE gross strength to 4+/5 as to improve functional strength for independent gait, increased standing tolerance and increased ADL ability.   ? Baseline 12/21: RLE gross strength 4/5 2/23: see note; 4/11: grossly 5/5 BLEs; 7/18: grossly 5/5 BLEs   ? Time 4   ?  Period Weeks   ? Status Achieved   ? Target Date 08/03/20   ? ?  ?  ? ?  ? ? ? ? PT Long Term Goals - 11/09/21 1442   ? ?  ? PT LONG TERM GOAL #1  ? Title Patient will increase FOTO score by at least 10 points to demonstrate improvement in mobility and quality of life.   ? Baseline 3/20: 61 4/26: 58   ? Time 12   ? Period Weeks   ? Status On-going   ? Target Date 12/26/21   ?  ? PT LONG TERM GOAL #2  ? Title Patient (> 65 years old) will complete five times sit to stand test in <12 seconds indicating an increased LE strength and improved balance.   ? Baseline 3/20: 13.5 sec (previously 14.8 sec on 3/13) 4/26 11.36 sec but used UE support on rep 1  on chair arms and in knees for remainder of reps)   ? Time 12   ? Period Weeks   ? Status Partially Met   ? Target Date 12/26/21   ?  ? PT LONG TERM GOAL #3  ? Title Patient will increase six minute walk test distance to >1000 for progression to community ambulator and improve gait ability   ? Baseline 3/20: deferred (previously 605 ft on 3/13)   ? Time 12   ? Period Weeks   ? Status On-going   ? Target Date 12/26/21   ?  ? PT LONG TERM GOAL #4  ? Title Patient will increase 10 meter walk test to >1.23ms as to improve gait  speed for better community ambulation and to reduce fall risk.   ? Baseline 3/20: 0.64 m/s 4/26: .71 m/s   ? Time 12   ? Period Weeks   ? Status On-going   ? Target Date 12/26/21   ?  ? PT LONG TERM GOAL #5  ? Ti

## 2021-11-17 ENCOUNTER — Inpatient Hospital Stay: Payer: Medicare Other

## 2021-11-17 ENCOUNTER — Encounter: Payer: Self-pay | Admitting: Radiology

## 2021-11-17 ENCOUNTER — Inpatient Hospital Stay: Payer: Medicare Other | Admitting: Anesthesiology

## 2021-11-17 ENCOUNTER — Emergency Department: Payer: Medicare Other

## 2021-11-17 ENCOUNTER — Other Ambulatory Visit: Payer: Self-pay

## 2021-11-17 ENCOUNTER — Encounter: Admission: EM | Disposition: E | Payer: Self-pay | Source: Home / Self Care | Attending: Internal Medicine

## 2021-11-17 DIAGNOSIS — E1165 Type 2 diabetes mellitus with hyperglycemia: Secondary | ICD-10-CM | POA: Diagnosis present

## 2021-11-17 DIAGNOSIS — F10129 Alcohol abuse with intoxication, unspecified: Secondary | ICD-10-CM | POA: Diagnosis present

## 2021-11-17 DIAGNOSIS — E119 Type 2 diabetes mellitus without complications: Secondary | ICD-10-CM | POA: Diagnosis not present

## 2021-11-17 DIAGNOSIS — Y92009 Unspecified place in unspecified non-institutional (private) residence as the place of occurrence of the external cause: Secondary | ICD-10-CM

## 2021-11-17 DIAGNOSIS — Y906 Blood alcohol level of 120-199 mg/100 ml: Secondary | ICD-10-CM | POA: Diagnosis present

## 2021-11-17 DIAGNOSIS — S066XAA Traumatic subarachnoid hemorrhage with loss of consciousness status unknown, initial encounter: Secondary | ICD-10-CM | POA: Diagnosis present

## 2021-11-17 DIAGNOSIS — Z20822 Contact with and (suspected) exposure to covid-19: Secondary | ICD-10-CM | POA: Diagnosis present

## 2021-11-17 DIAGNOSIS — J690 Pneumonitis due to inhalation of food and vomit: Secondary | ICD-10-CM | POA: Diagnosis not present

## 2021-11-17 DIAGNOSIS — J9811 Atelectasis: Secondary | ICD-10-CM | POA: Diagnosis not present

## 2021-11-17 DIAGNOSIS — G935 Compression of brain: Secondary | ICD-10-CM | POA: Diagnosis not present

## 2021-11-17 DIAGNOSIS — I10 Essential (primary) hypertension: Secondary | ICD-10-CM | POA: Diagnosis present

## 2021-11-17 DIAGNOSIS — J158 Pneumonia due to other specified bacteria: Secondary | ICD-10-CM | POA: Diagnosis not present

## 2021-11-17 DIAGNOSIS — R569 Unspecified convulsions: Secondary | ICD-10-CM | POA: Diagnosis not present

## 2021-11-17 DIAGNOSIS — Y92008 Other place in unspecified non-institutional (private) residence as the place of occurrence of the external cause: Secondary | ICD-10-CM | POA: Diagnosis not present

## 2021-11-17 DIAGNOSIS — K219 Gastro-esophageal reflux disease without esophagitis: Secondary | ICD-10-CM

## 2021-11-17 DIAGNOSIS — G913 Post-traumatic hydrocephalus, unspecified: Secondary | ICD-10-CM | POA: Diagnosis not present

## 2021-11-17 DIAGNOSIS — Z7189 Other specified counseling: Secondary | ICD-10-CM | POA: Diagnosis not present

## 2021-11-17 DIAGNOSIS — R402233 Coma scale, best verbal response, inappropriate words, at hospital admission: Secondary | ICD-10-CM | POA: Diagnosis present

## 2021-11-17 DIAGNOSIS — F10139 Alcohol abuse with withdrawal, unspecified: Secondary | ICD-10-CM | POA: Diagnosis not present

## 2021-11-17 DIAGNOSIS — G8321 Monoplegia of upper limb affecting right dominant side: Secondary | ICD-10-CM | POA: Diagnosis present

## 2021-11-17 DIAGNOSIS — J9602 Acute respiratory failure with hypercapnia: Secondary | ICD-10-CM | POA: Diagnosis not present

## 2021-11-17 DIAGNOSIS — G9341 Metabolic encephalopathy: Secondary | ICD-10-CM | POA: Diagnosis present

## 2021-11-17 DIAGNOSIS — W1830XA Fall on same level, unspecified, initial encounter: Secondary | ICD-10-CM | POA: Diagnosis present

## 2021-11-17 DIAGNOSIS — J9601 Acute respiratory failure with hypoxia: Secondary | ICD-10-CM | POA: Diagnosis not present

## 2021-11-17 DIAGNOSIS — F10929 Alcohol use, unspecified with intoxication, unspecified: Secondary | ICD-10-CM

## 2021-11-17 DIAGNOSIS — Z66 Do not resuscitate: Secondary | ICD-10-CM | POA: Diagnosis present

## 2021-11-17 DIAGNOSIS — W19XXXA Unspecified fall, initial encounter: Secondary | ICD-10-CM

## 2021-11-17 DIAGNOSIS — A419 Sepsis, unspecified organism: Secondary | ICD-10-CM | POA: Diagnosis not present

## 2021-11-17 DIAGNOSIS — I609 Nontraumatic subarachnoid hemorrhage, unspecified: Secondary | ICD-10-CM

## 2021-11-17 DIAGNOSIS — R402113 Coma scale, eyes open, never, at hospital admission: Secondary | ICD-10-CM | POA: Diagnosis present

## 2021-11-17 DIAGNOSIS — R4182 Altered mental status, unspecified: Secondary | ICD-10-CM | POA: Diagnosis present

## 2021-11-17 DIAGNOSIS — R402353 Coma scale, best motor response, localizes pain, at hospital admission: Secondary | ICD-10-CM | POA: Diagnosis present

## 2021-11-17 DIAGNOSIS — Z23 Encounter for immunization: Secondary | ICD-10-CM | POA: Diagnosis present

## 2021-11-17 DIAGNOSIS — E1140 Type 2 diabetes mellitus with diabetic neuropathy, unspecified: Secondary | ICD-10-CM | POA: Diagnosis present

## 2021-11-17 DIAGNOSIS — E870 Hyperosmolality and hypernatremia: Secondary | ICD-10-CM | POA: Diagnosis not present

## 2021-11-17 DIAGNOSIS — Z515 Encounter for palliative care: Secondary | ICD-10-CM | POA: Diagnosis not present

## 2021-11-17 HISTORY — PX: CRANIOTOMY: SHX93

## 2021-11-17 LAB — BLOOD GAS, ARTERIAL
Acid-base deficit: 2 mmol/L (ref 0.0–2.0)
Acid-base deficit: 3.4 mmol/L — ABNORMAL HIGH (ref 0.0–2.0)
Bicarbonate: 22.3 mmol/L (ref 20.0–28.0)
Bicarbonate: 21.4 mmol/L (ref 20.0–28.0)
FIO2: 70 %
O2 Saturation: 97.4 %
O2 Saturation: 99.3 %
PEEP: 8 cmH2O
Patient temperature: 37
Patient temperature: 37
RATE: 16 resp/min
Spontaneous VT: 500 mL
pCO2 arterial: 36 mmHg (ref 32–48)
pCO2 arterial: 37 mmHg (ref 32–48)
pH, Arterial: 7.4 (ref 7.35–7.45)
pH, Arterial: 7.37 (ref 7.35–7.45)
pO2, Arterial: 78 mmHg — ABNORMAL LOW (ref 83–108)
pO2, Arterial: 111 mmHg — ABNORMAL HIGH (ref 83–108)

## 2021-11-17 LAB — COMPREHENSIVE METABOLIC PANEL
ALT: 16 U/L (ref 0–44)
AST: 23 U/L (ref 15–41)
Albumin: 3.7 g/dL (ref 3.5–5.0)
Alkaline Phosphatase: 58 U/L (ref 38–126)
Anion gap: 13 (ref 5–15)
BUN: 8 mg/dL (ref 8–23)
CO2: 24 mmol/L (ref 22–32)
Calcium: 9 mg/dL (ref 8.9–10.3)
Chloride: 102 mmol/L (ref 98–111)
Creatinine, Ser: 0.67 mg/dL (ref 0.61–1.24)
GFR, Estimated: >60 mL/min (ref >60)
Glucose, Bld: 140 mg/dL — ABNORMAL HIGH (ref 70–99)
Potassium: 3.3 mmol/L — ABNORMAL LOW (ref 3.5–5.1)
Sodium: 139 mmol/L (ref 135–145)
Total Bilirubin: 0.7 mg/dL (ref 0.3–1.2)
Total Protein: 6.4 g/dL — ABNORMAL LOW (ref 6.5–8.1)

## 2021-11-17 LAB — URINALYSIS, ROUTINE W REFLEX MICROSCOPIC
Bilirubin Urine: NEGATIVE
Glucose, UA: 50 mg/dL — AB
Ketones, ur: 20 mg/dL — AB
Leukocytes,Ua: NEGATIVE
Nitrite: NEGATIVE
Protein, ur: NEGATIVE mg/dL
Specific Gravity, Urine: 1.03 (ref 1.005–1.030)
pH: 5 (ref 5.0–8.0)

## 2021-11-17 LAB — CBC
HCT: 40.5 % (ref 39.0–52.0)
Hemoglobin: 12.5 g/dL — ABNORMAL LOW (ref 13.0–17.0)
MCH: 25.5 pg — ABNORMAL LOW (ref 26.0–34.0)
MCHC: 30.9 g/dL (ref 30.0–36.0)
MCV: 82.5 fL (ref 80.0–100.0)
Platelets: 219 10*3/uL (ref 150–400)
RBC: 4.91 MIL/uL (ref 4.22–5.81)
RDW: 16.7 % — ABNORMAL HIGH (ref 11.5–15.5)
WBC: 8.2 10*3/uL (ref 4.0–10.5)
nRBC: 0 % (ref 0.0–0.2)

## 2021-11-17 LAB — MRSA NEXT GEN BY PCR, NASAL: MRSA by PCR Next Gen: NOT DETECTED

## 2021-11-17 LAB — GLUCOSE, CAPILLARY
Glucose-Capillary: 130 mg/dL — ABNORMAL HIGH (ref 70–99)
Glucose-Capillary: 158 mg/dL — ABNORMAL HIGH (ref 70–99)
Glucose-Capillary: 159 mg/dL — ABNORMAL HIGH (ref 70–99)
Glucose-Capillary: 178 mg/dL — ABNORMAL HIGH (ref 70–99)
Glucose-Capillary: 213 mg/dL — ABNORMAL HIGH (ref 70–99)

## 2021-11-17 LAB — URINE DRUG SCREEN, QUALITATIVE (ARMC ONLY)
Amphetamines, Ur Screen: NOT DETECTED
Barbiturates, Ur Screen: NOT DETECTED
Benzodiazepine, Ur Scrn: NOT DETECTED
Cannabinoid 50 Ng, Ur ~~LOC~~: NOT DETECTED
Cocaine Metabolite,Ur ~~LOC~~: NOT DETECTED
MDMA (Ecstasy)Ur Screen: NOT DETECTED
Methadone Scn, Ur: NOT DETECTED
Opiate, Ur Screen: NOT DETECTED
Phencyclidine (PCP) Ur S: NOT DETECTED
Tricyclic, Ur Screen: NOT DETECTED

## 2021-11-17 LAB — CBG MONITORING, ED
Glucose-Capillary: 152 mg/dL — ABNORMAL HIGH (ref 70–99)
Glucose-Capillary: 176 mg/dL — ABNORMAL HIGH (ref 70–99)
Glucose-Capillary: 204 mg/dL — ABNORMAL HIGH (ref 70–99)

## 2021-11-17 LAB — TROPONIN I (HIGH SENSITIVITY)
Troponin I (High Sensitivity): 5 ng/L (ref <18)
Troponin I (High Sensitivity): 6 ng/L (ref <18)

## 2021-11-17 LAB — RESP PANEL BY RT-PCR (FLU A&B, COVID) ARPGX2
Influenza A by PCR: NEGATIVE
Influenza B by PCR: NEGATIVE
SARS Coronavirus 2 by RT PCR: NEGATIVE

## 2021-11-17 LAB — PROTIME-INR
INR: 1 (ref 0.8–1.2)
Prothrombin Time: 13.4 seconds (ref 11.4–15.2)

## 2021-11-17 LAB — ABO/RH: ABO/RH(D): O POS

## 2021-11-17 LAB — ETHANOL: Alcohol, Ethyl (B): 188 mg/dL — ABNORMAL HIGH (ref <10)

## 2021-11-17 IMAGING — DX DG ABD PORTABLE 1V
1 series · 1 of 1 positions shown · non-contrast
Comparison: [DATE] at [AZ] hours

CLINICAL DATA: OG tube placement

EXAM:
PORTABLE ABDOMEN - 1 VIEW

[abdomen supine]
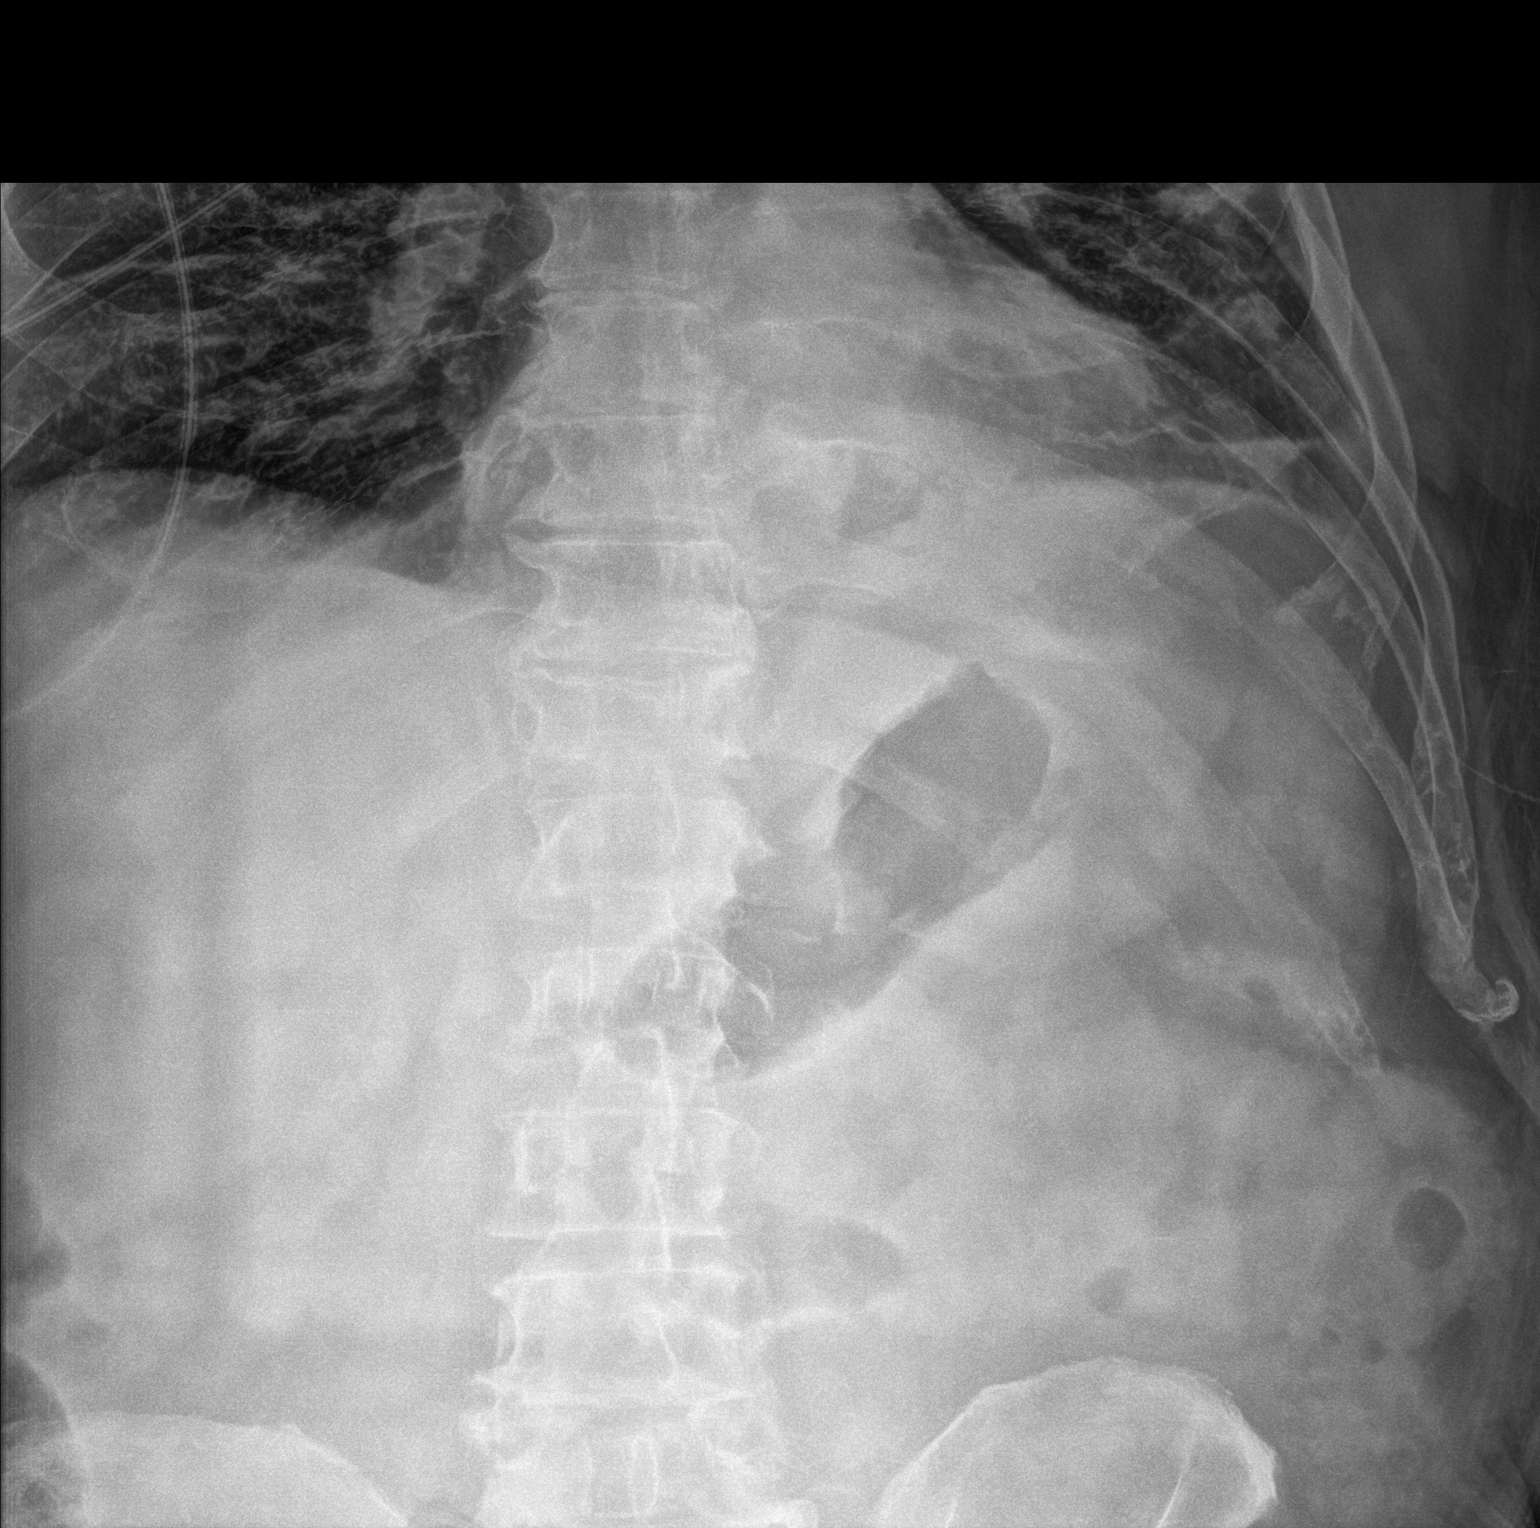

[1 of 1 positions shown; findings below may reference images not displayed]

FINDINGS: Distal tip of orogastric tube is seen within the mid chest
approximately 10 cm above the level of the GE junction. Included
bowel gas pattern is nonobstructive.
IMPRESSION: Distal tip of orogastric tube within the mid chest approximately 10
cm above the level of the GE junction. Recommend advancement into
the stomach.

## 2021-11-17 IMAGING — DX DG CHEST 1V PORT
1 series · 2 of 2 positions shown · non-contrast
Comparison: Today at [DATE] a.m.

CLINICAL DATA: Intubation.

EXAM:
PORTABLE CHEST 1 VIEW

[Series 1: chest ap · 0.14mm/px · 2 of 2 slices shown]
[im 1/2]
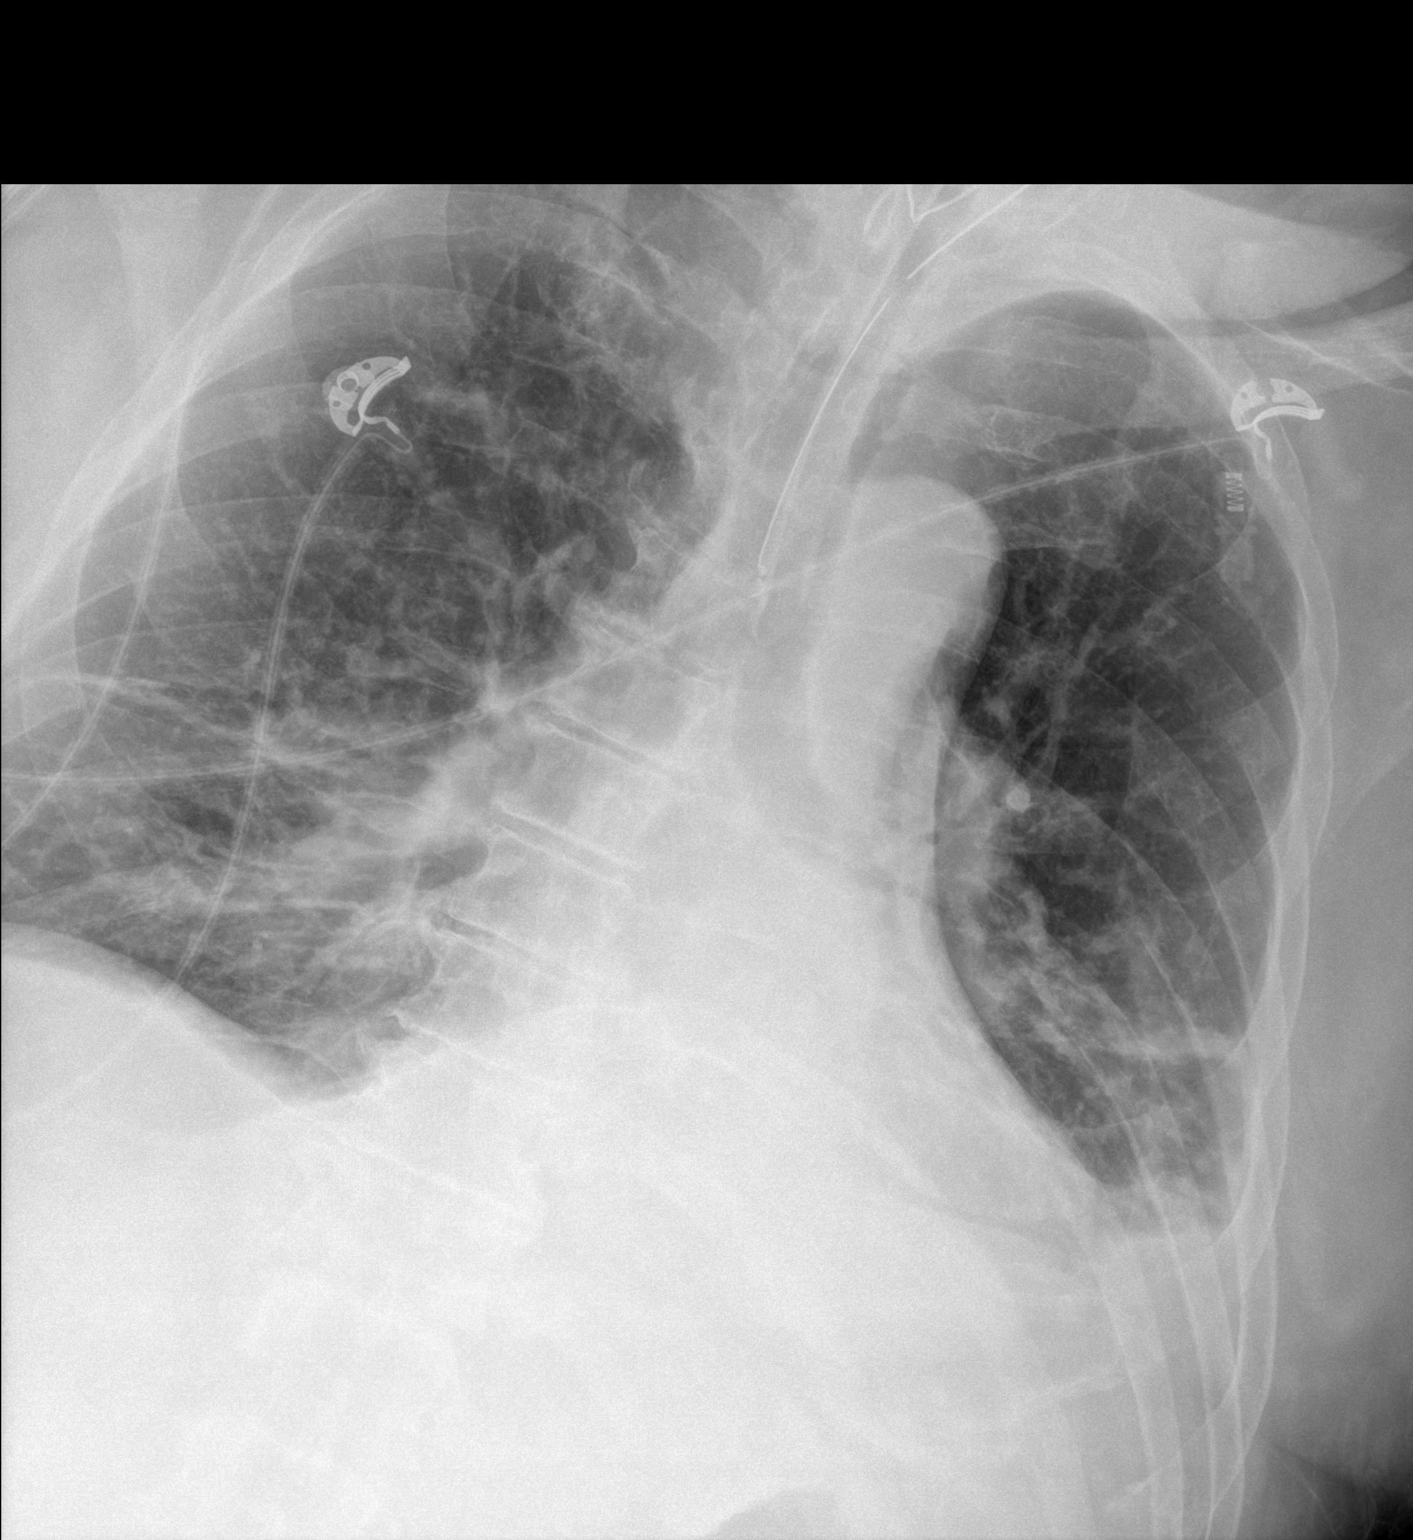
[im 2/2]
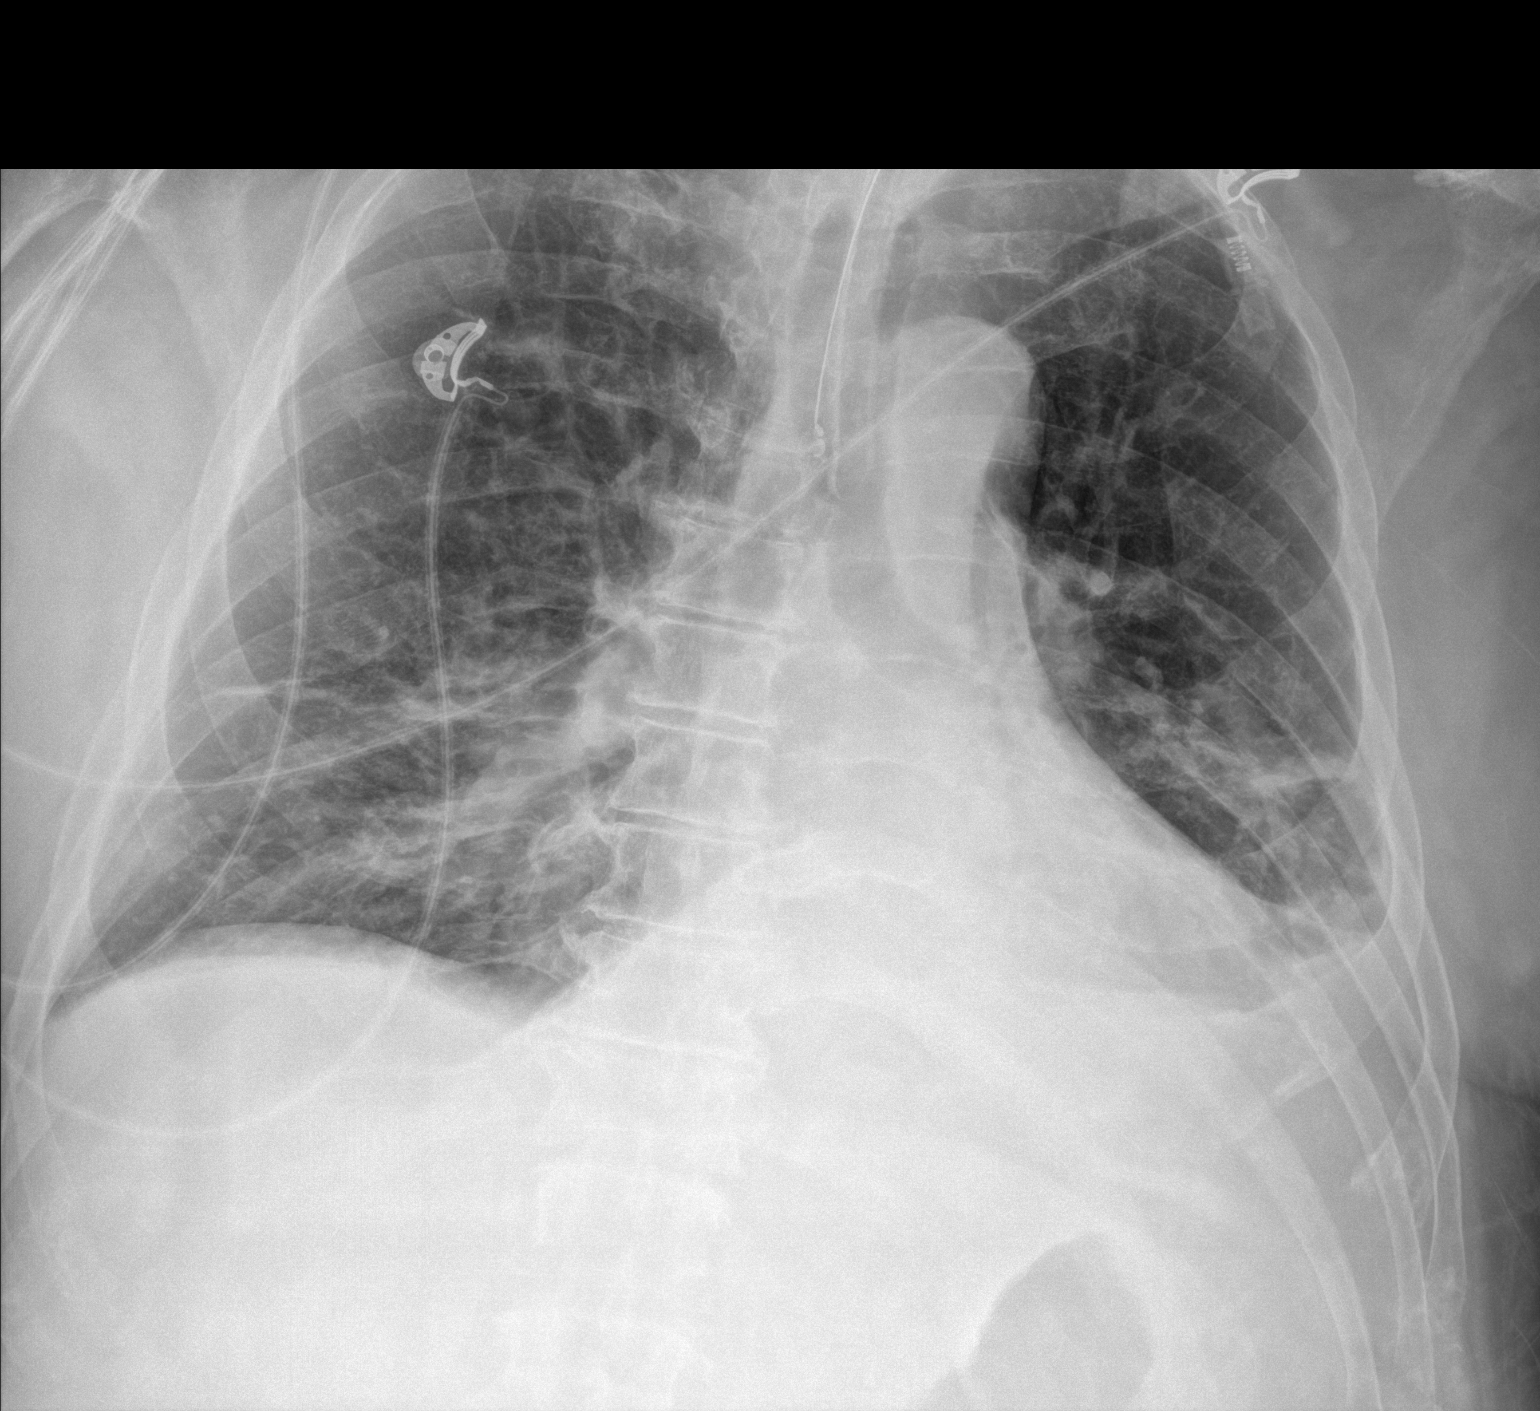

[2 of 2 positions shown; findings below may reference images not displayed]

FINDINGS: Endotracheal tube with tip 9 mm above the carina.

Enteric tube which loops at the level of the lower neck, tip at the
mid chest.

Worsening aeration at the left base. Streaky opacity at both lung
bases with small left pleural effusion. No pneumothorax. Normal
heart size.
IMPRESSION: 1. Endotracheal tube with tip 9 mm above the carina.
2. Malpositioned enteric tube which loops at the neck and tip
terminates at the mid chest.
3. Worsening aeration at the left base.

## 2021-11-17 IMAGING — CT CT HEAD W/O CM
4 series · 16 of 47 positions shown, 18 images · non-contrast
Comparison: Head CT [DATE]

CLINICAL DATA: Craniotomy, postop



[Series 2: head bone · axial · 0.45mm/px · z∈[-397,-361]mm · 3 of 88 slices shown]
[im 9/88  bone]
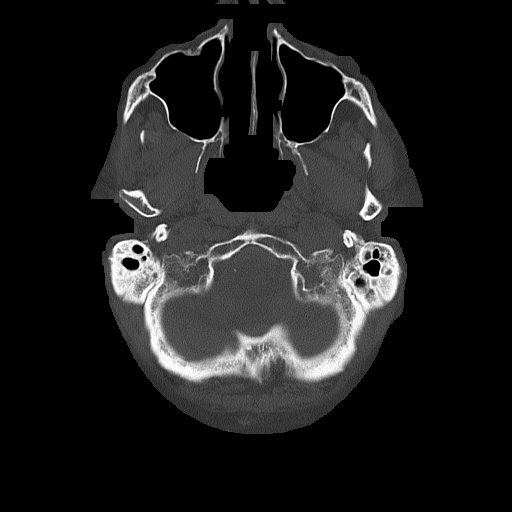
[im 18/88  bone]
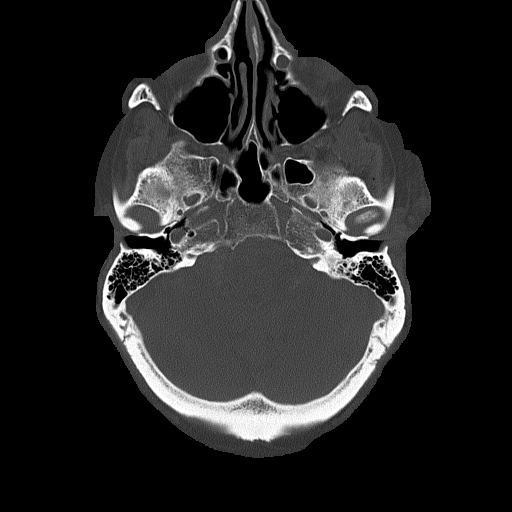
[im 27/88  bone]
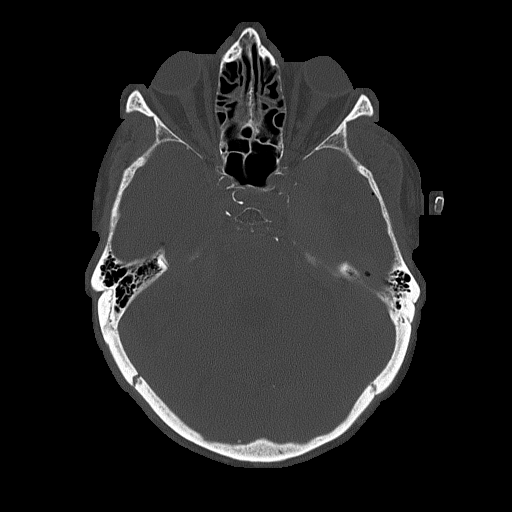

[Series 3: head wo · axial · 0.45mm/px · z∈[-393,-263]mm · 7 of 36 slices shown, 9 images]
[im 5/36  brain]
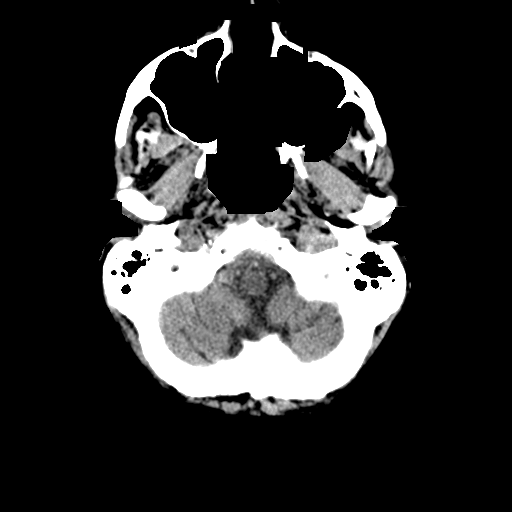
[im 5/36  bone]
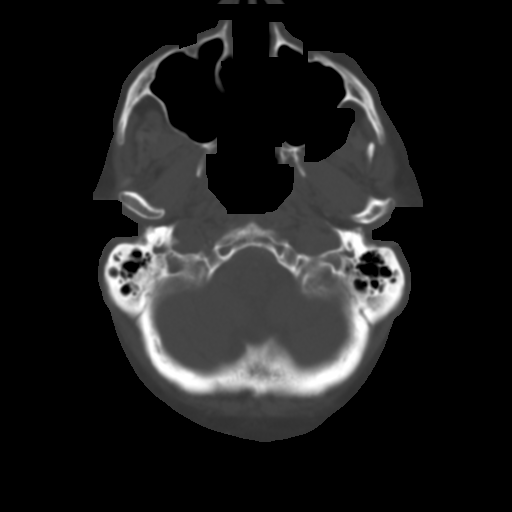
[im 9/36  brain]
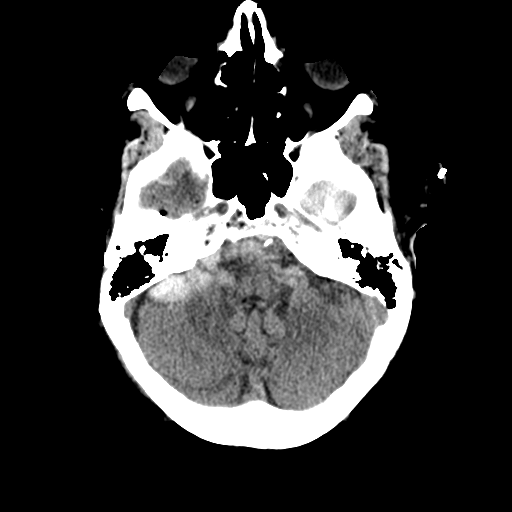
[im 14/36  brain]
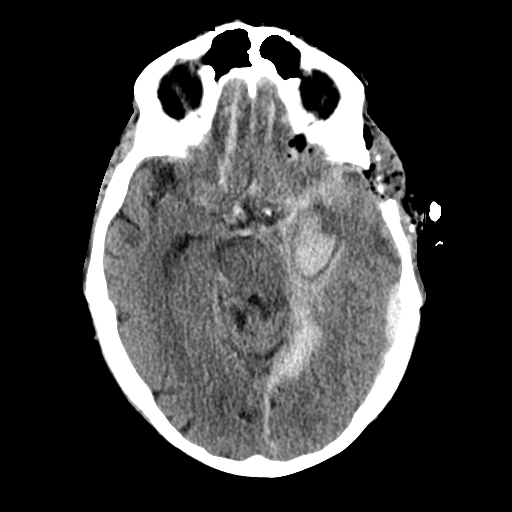
[im 18/36  brain]
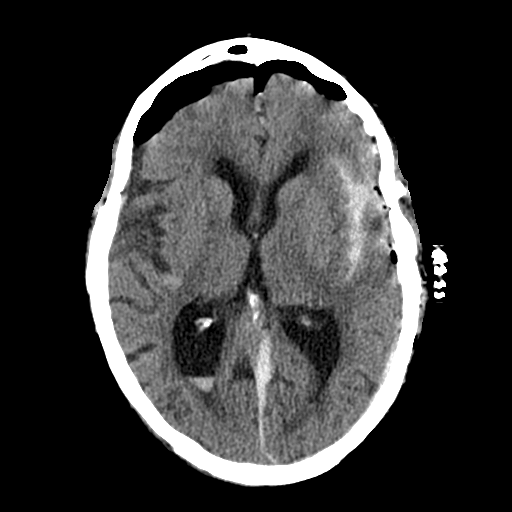
[im 22/36  brain]
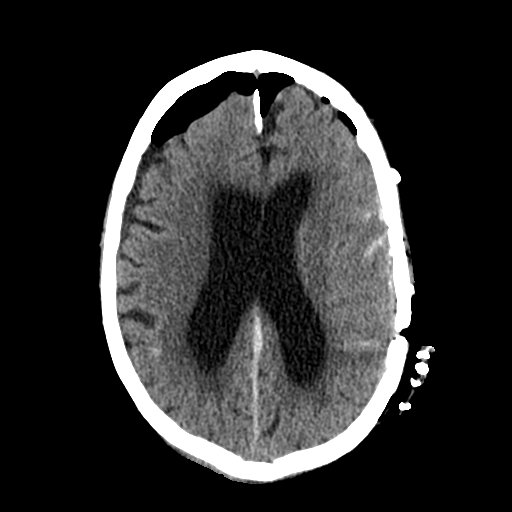
[im 22/36  bone]
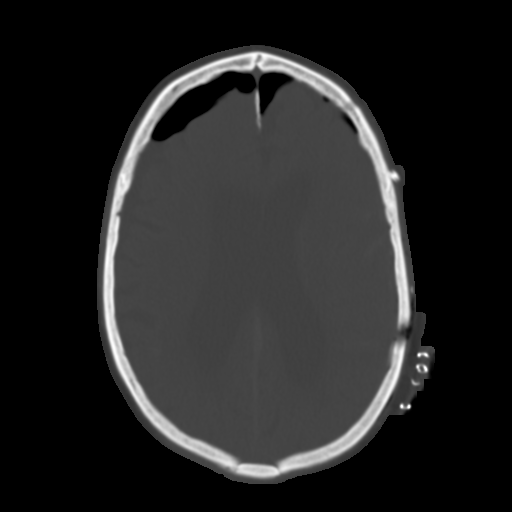
[im 27/36  brain]
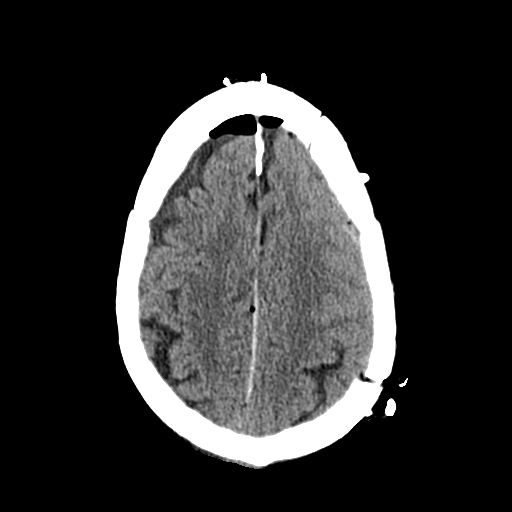
[im 31/36  brain]
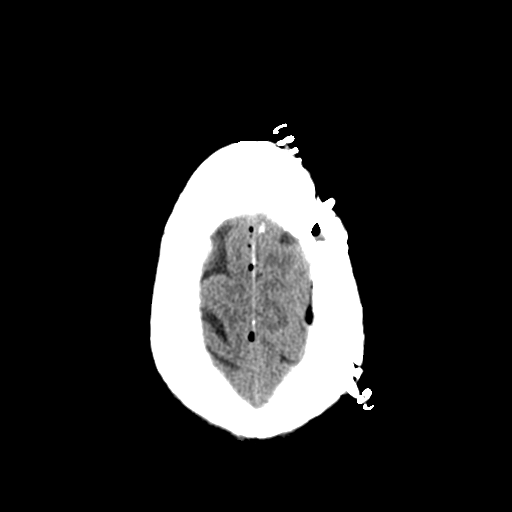

[Series 4: coronal soft tissue · coronal · 0.31mm/px · 3 of 72 slices shown]
[im 24/72  brain]
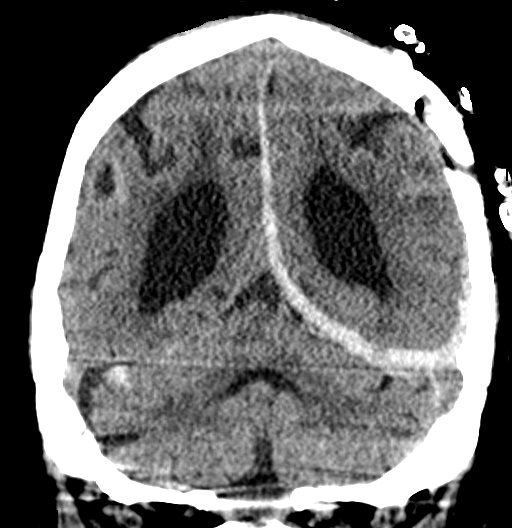
[im 32/72  brain]
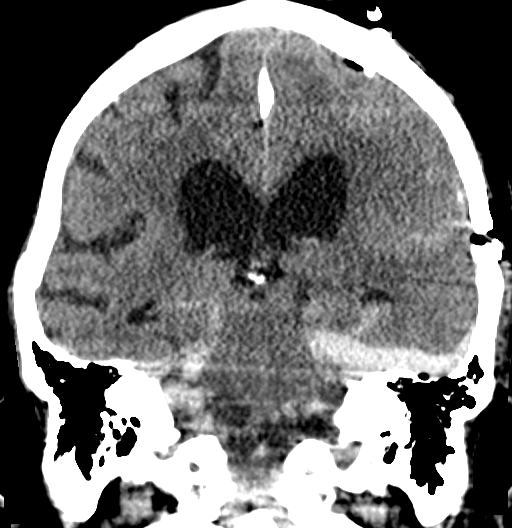
[im 40/72  brain]
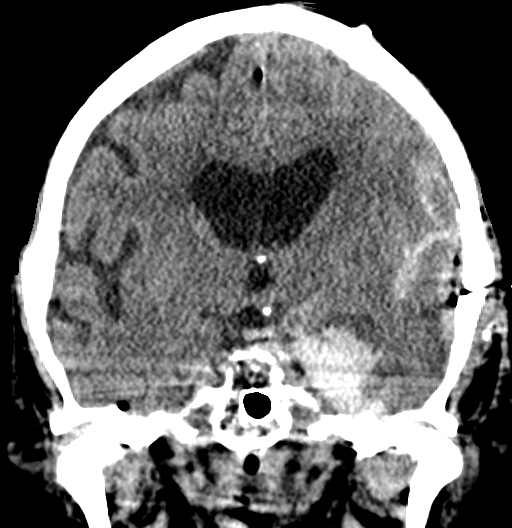

[Series 5: sagittal soft tissue · sagittal · 0.32mm/px · 3 of 56 slices shown]
[im 19/56  brain]
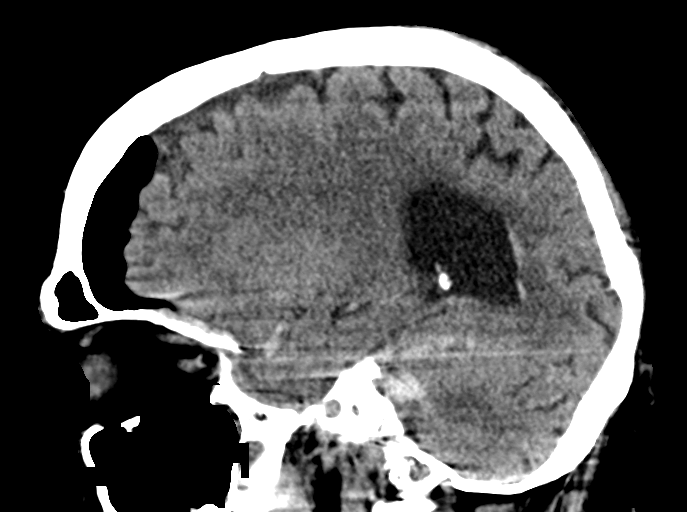
[im 28/56  brain]
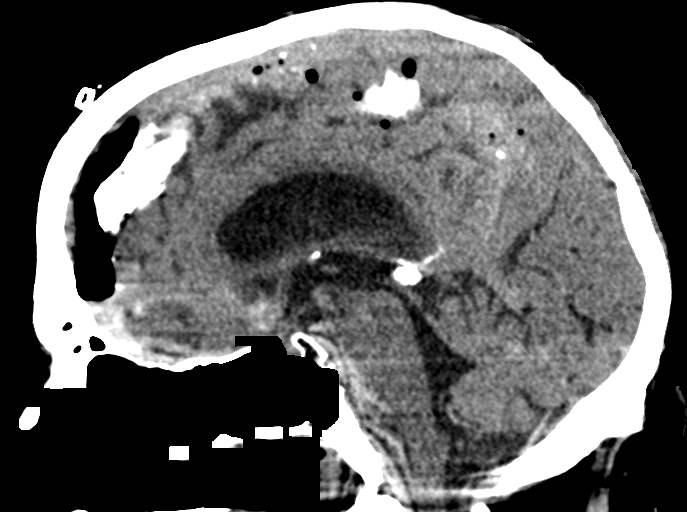
[im 37/56  brain]
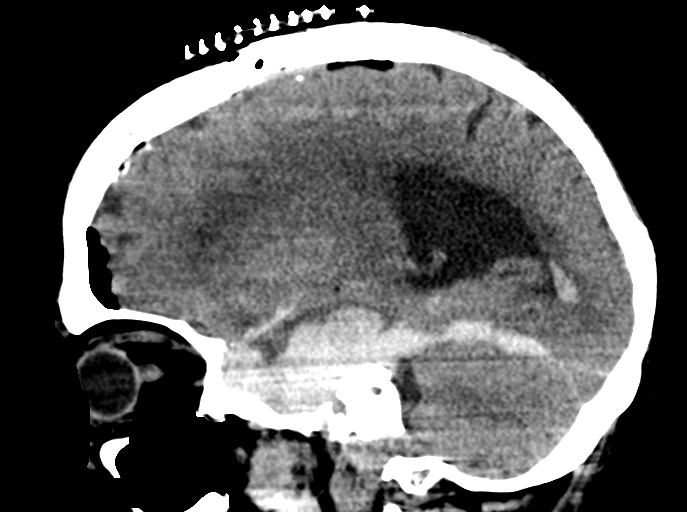

[16 of 47 positions shown; findings below may reference images not displayed]

FINDINGS: Brain: Status post left convexity subdural hematoma evacuation.
Midline shift has resolved. There is still mixed subdural and
subarachnoid blood surrounding the cerebellum and over the left
convexity, but the dominant component of the subdural hematoma has
markedly decreased. There is moderate pneumocephalus.

Vascular: No abnormal hyperdensity of the major intracranial
arteries or dural venous sinuses. No intracranial atherosclerosis.

Skull: Recent left-sided craniotomy

Sinuses/Orbits: No fluid levels or advanced mucosal thickening of
the visualized paranasal sinuses. No mastoid or middle ear effusion.
The orbits are normal.
IMPRESSION: 1. Status post left convexity subdural hematoma evacuation with
resolution of midline shift.
2. Residual mixed subdural and subarachnoid blood surrounding the
cerebellum and over the left convexity, but the dominant component
of the subdural hematoma has markedly decreased.

## 2021-11-17 IMAGING — CT CT ANGIO HEAD
3 of 7 series · 18 of 47 positions shown · IV contrast (APPLIED)
Comparison: None Available.

CLINICAL DATA: Subarachnoid hemorrhage

EXAM:
CT ANGIOGRAPHY HEAD
TECHNIQUE: Multidetector CT imaging of the head was performed using the
standard protocol during bolus administration of intravenous
contrast. Multiplanar CT image reconstructions and MIPs were
obtained to evaluate the vascular anatomy.

[Series 6: ax thin · axial · 0.40mm/px · z∈[+227,+379]mm · 12 of 178 slices shown]
[im 11/178  brain]
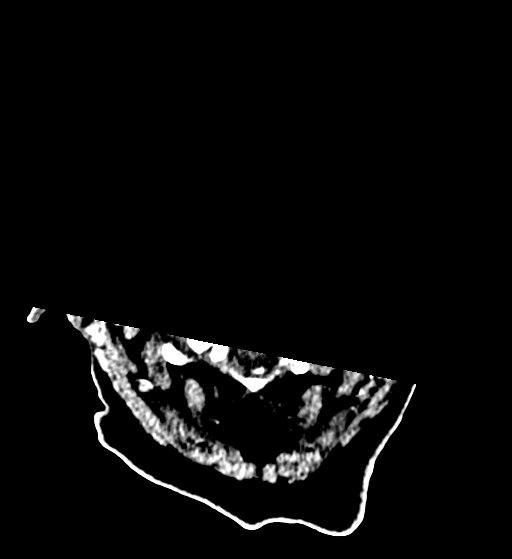
[im 32/178  bone]
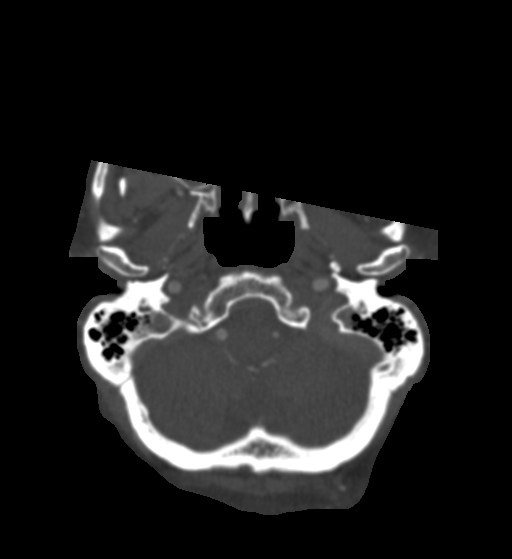
[im 42/178  brain]
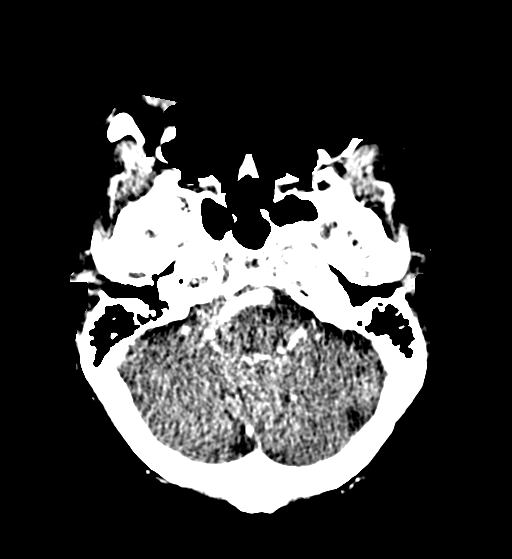
[im 53/178  bone]
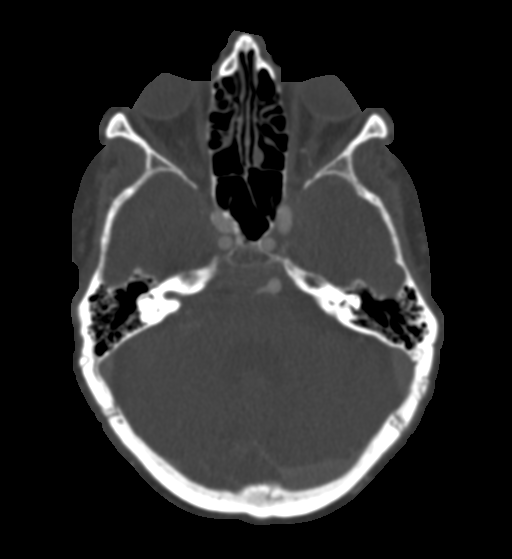
[im 73/178  brain]
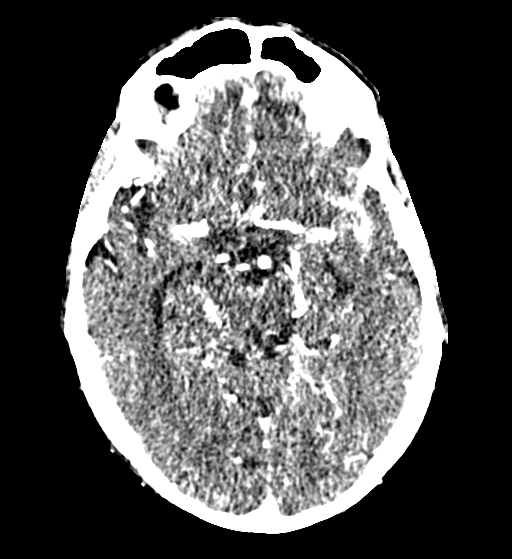
[im 84/178  bone]
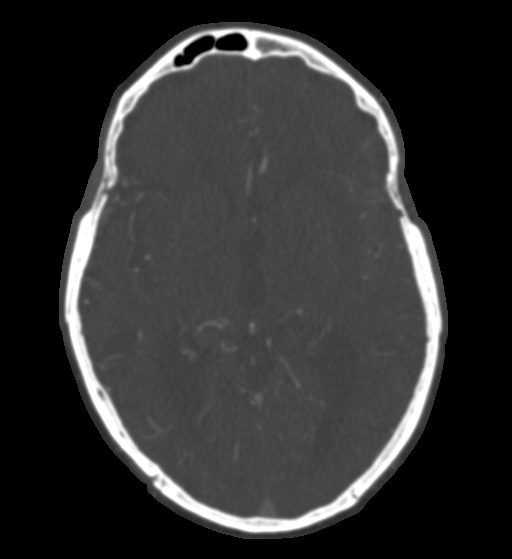
[im 94/178  brain]
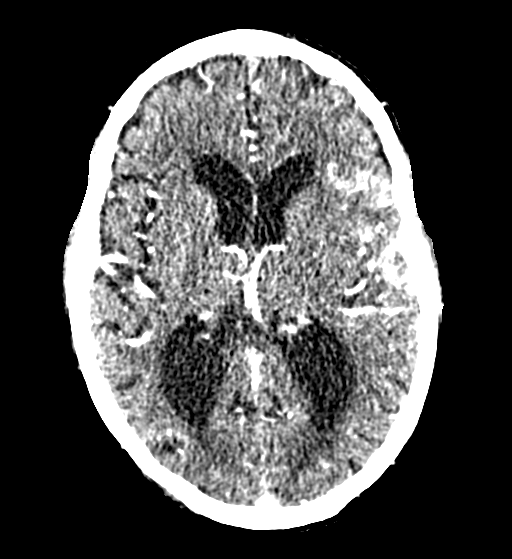
[im 115/178  bone]
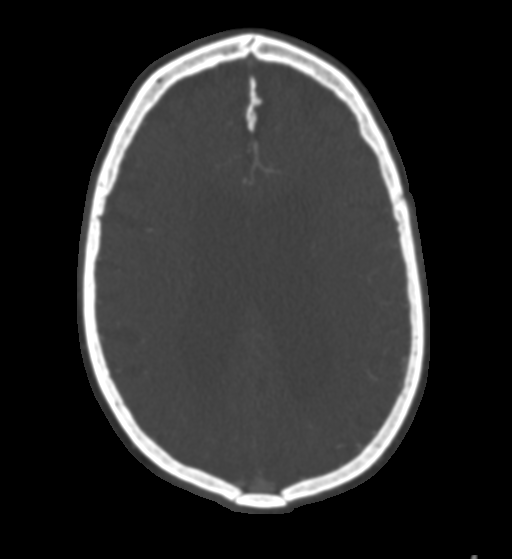
[im 125/178  brain]
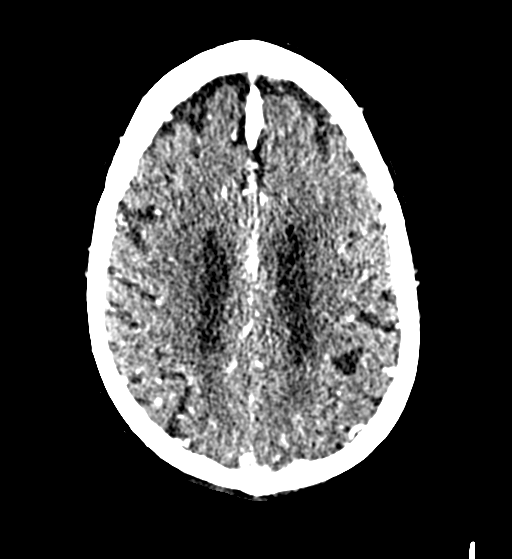
[im 136/178  bone]
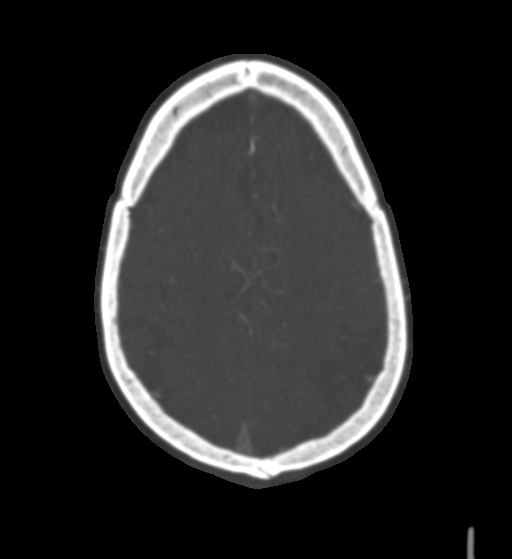
[im 157/178  brain]
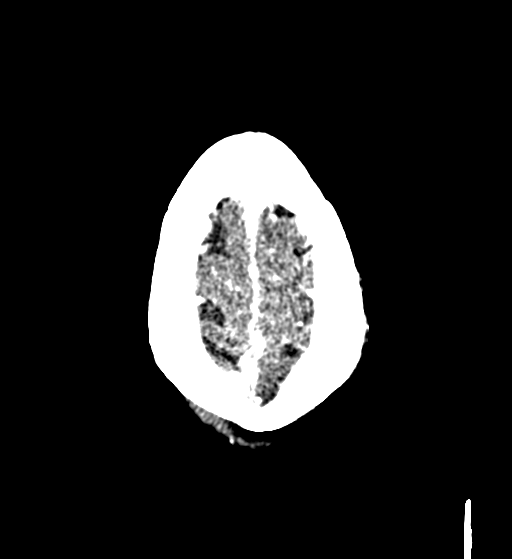
[im 167/178  bone]
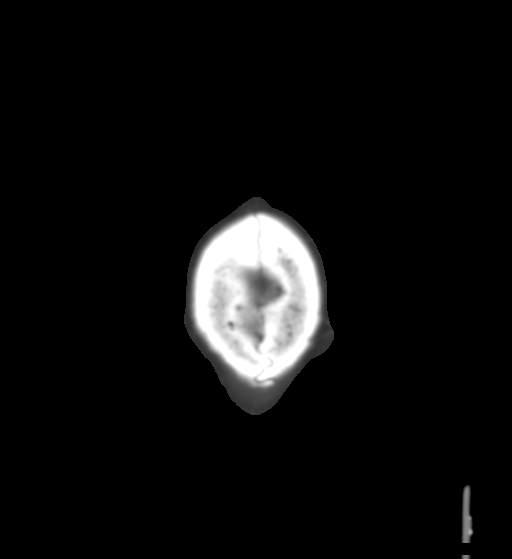

[Series 8: cor thin · coronal · 0.36mm/px · 3 of 215 slices shown]
[im 62/215  brain]
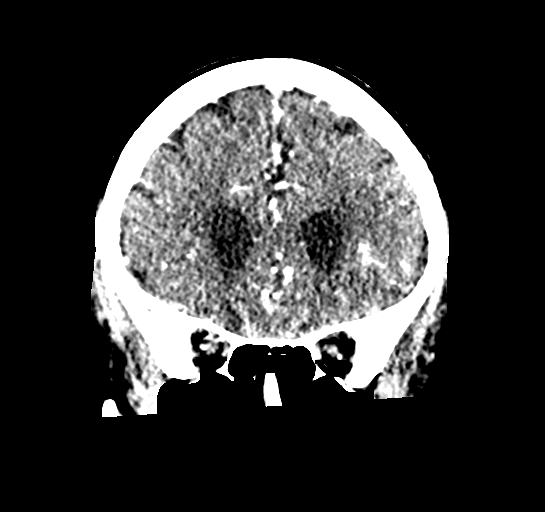
[im 92/215  brain]
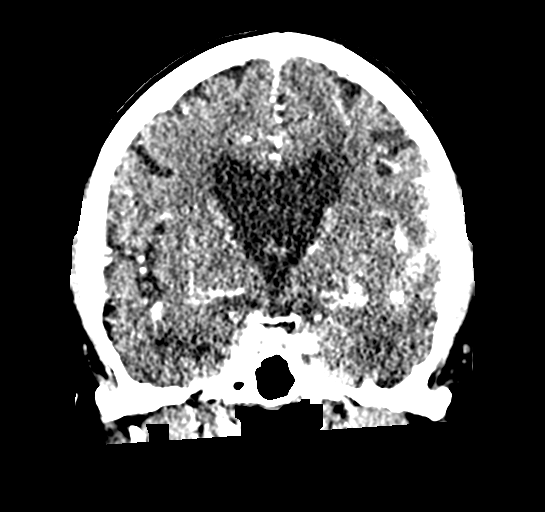
[im 123/215  brain]
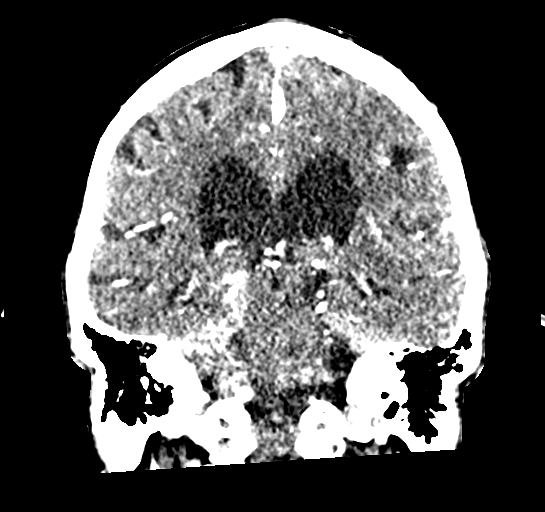

[Series 10: sag thin · sagittal · 0.36mm/px · 3 of 154 slices shown]
[im 31/154  brain]
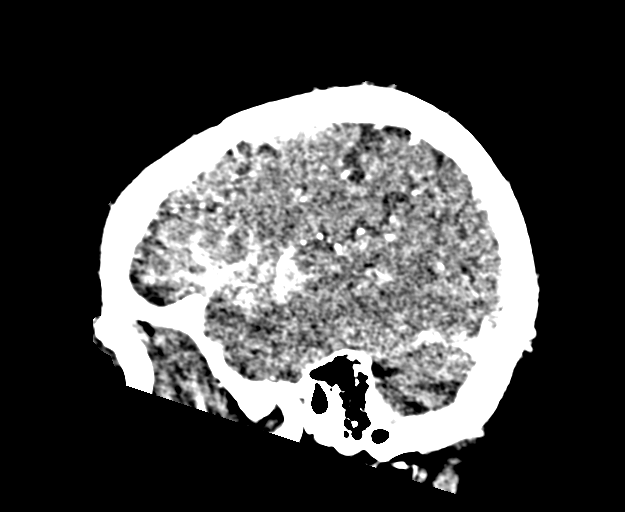
[im 62/154  brain]
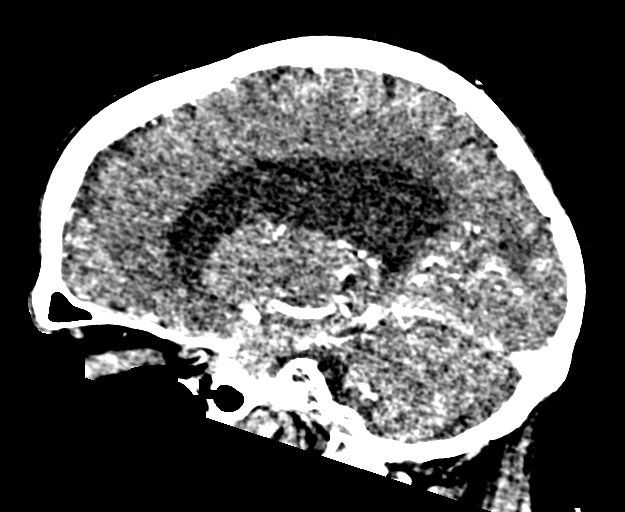
[im 92/154  brain]
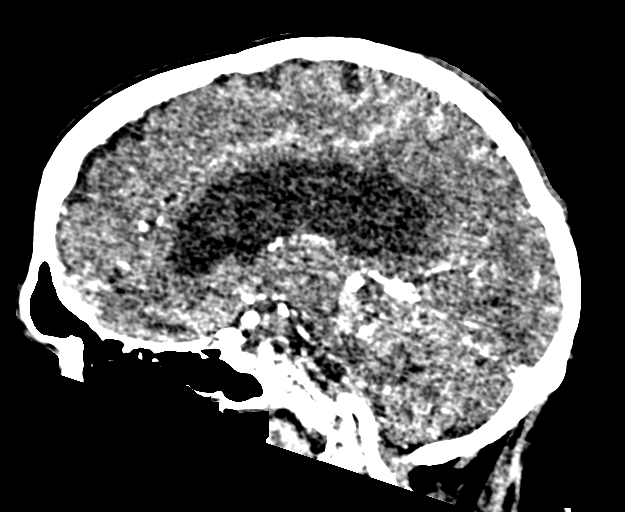

[18 of 47 positions shown; findings below may reference images not displayed]

RADIATION DOSE REDUCTION: This exam was performed according to the
departmental dose-optimization program which includes automated
exposure control, adjustment of the mA and/or kV according to
patient size and/or use of iterative reconstruction technique.

CONTRAST:  75mL OMNIPAQUE IOHEXOL 350 MG/ML SOLN
FINDINGS: POSTERIOR CIRCULATION:

--Vertebral arteries: Normal

--Inferior cerebellar arteries: Normal.

--Basilar artery: Normal.

--Superior cerebellar arteries: Normal.

--Posterior cerebral arteries: Normal.

ANTERIOR CIRCULATION:

--Intracranial internal carotid arteries: Normal.

--Anterior cerebral arteries (ACA): Normal.

--Middle cerebral arteries (MCA): Normal.

ANATOMIC VARIANTS: None

Review of the MIP images confirms the above findings.
IMPRESSION: No aneurysm, occlusion or high-grade stenosis of the intracranial
arteries.

## 2021-11-17 IMAGING — CT CT HEAD W/O CM
4 series · 16 of 47 positions shown, 18 images · non-contrast
Comparison: None Available.

CLINICAL DATA: Recent fall with altered mental status



[Series 2: head wo · axial · 0.50mm/px · z∈[+330,+460]mm · 7 of 36 slices shown, 9 images]
[im 5/36  brain]
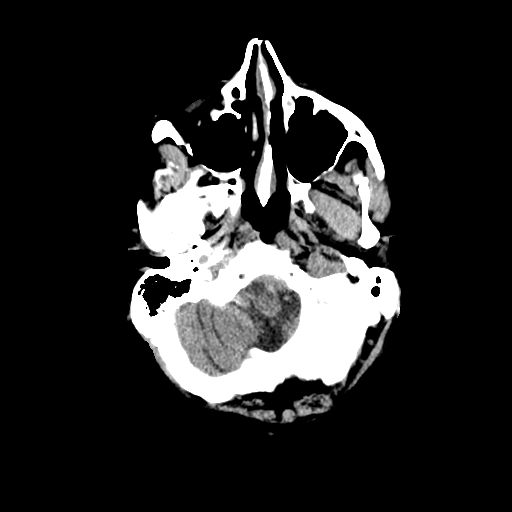
[im 5/36  bone]
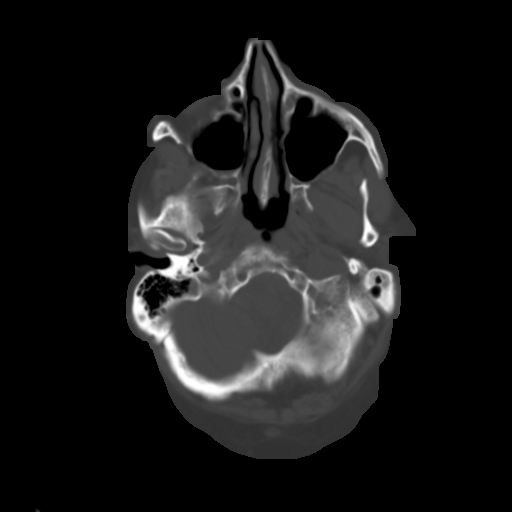
[im 9/36  brain]
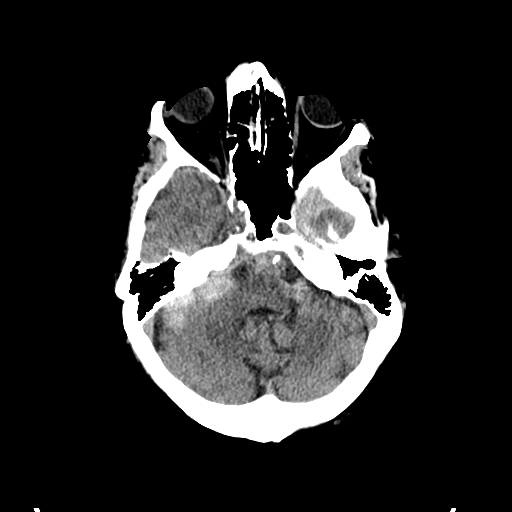
[im 14/36  brain]
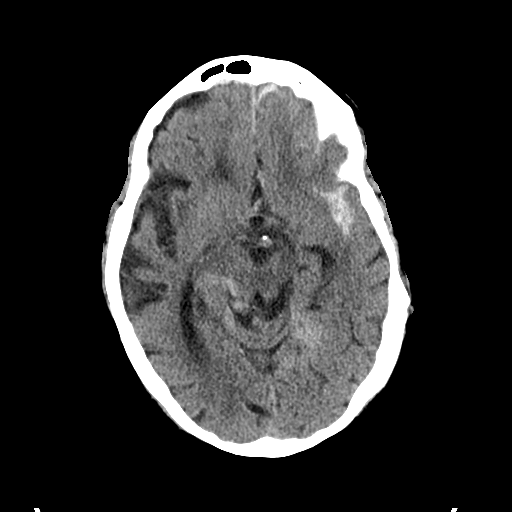
[im 18/36  brain]
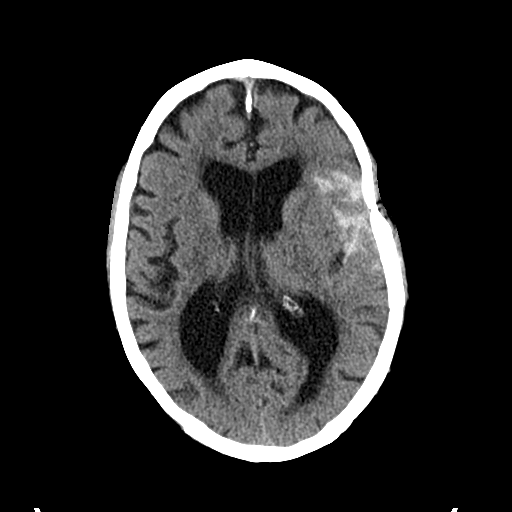
[im 22/36  brain]
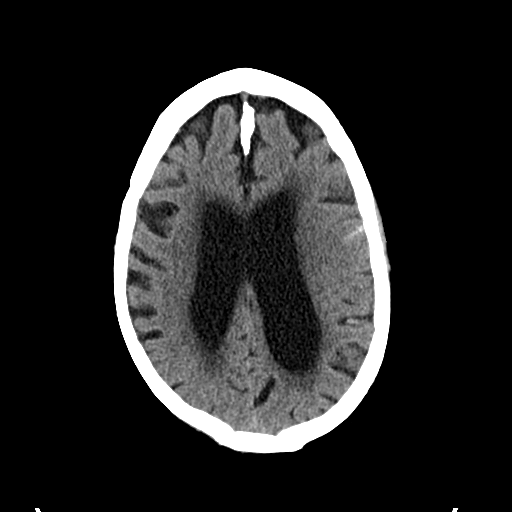
[im 22/36  bone]
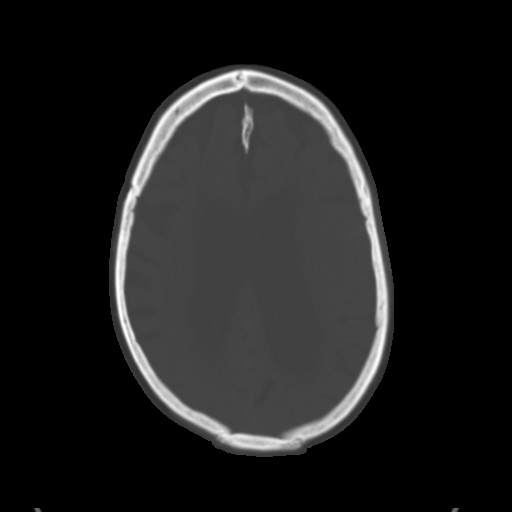
[im 27/36  brain]
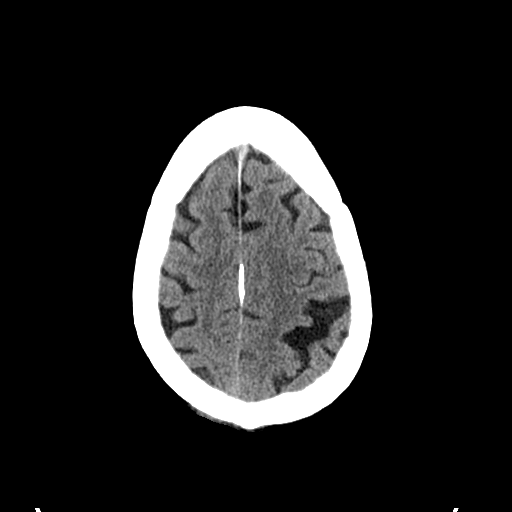
[im 31/36  brain]
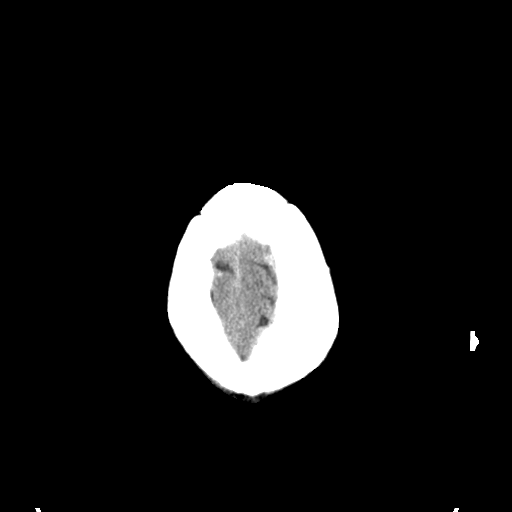

[Series 3: head bone · axial · 0.50mm/px · z∈[+326,+362]mm · 3 of 90 slices shown]
[im 9/90  bone]
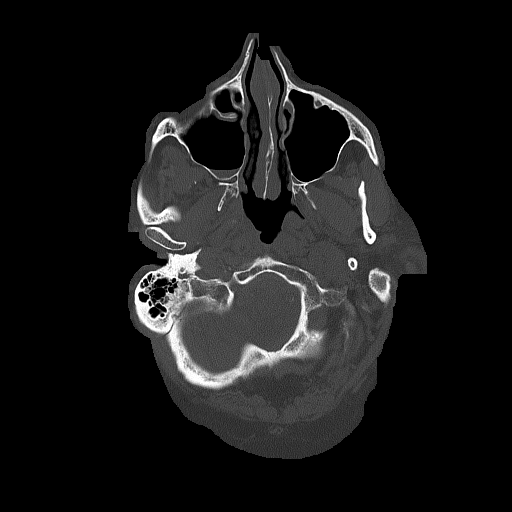
[im 18/90  bone]
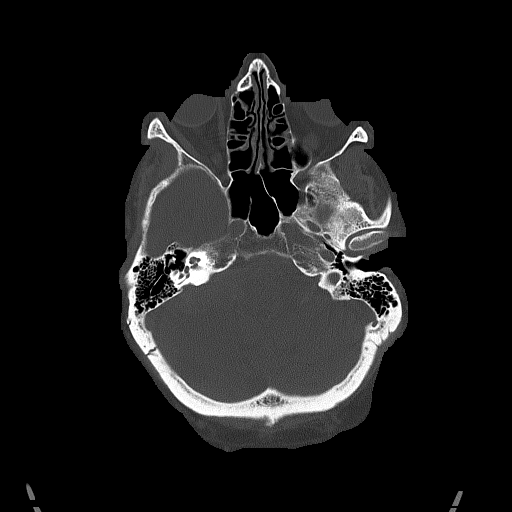
[im 27/90  bone]
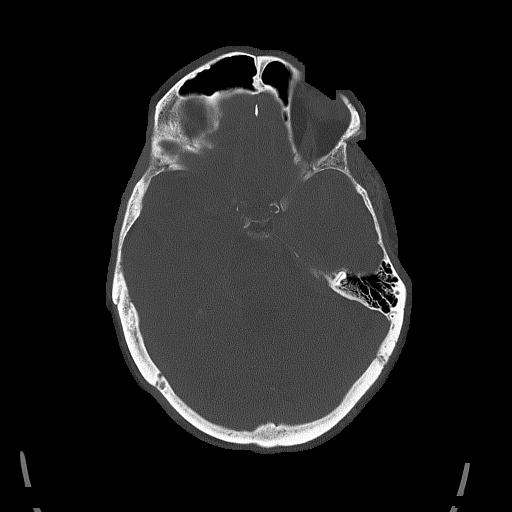

[Series 4: coronal soft tissue · coronal · 0.36mm/px · 3 of 73 slices shown]
[im 25/73  brain]
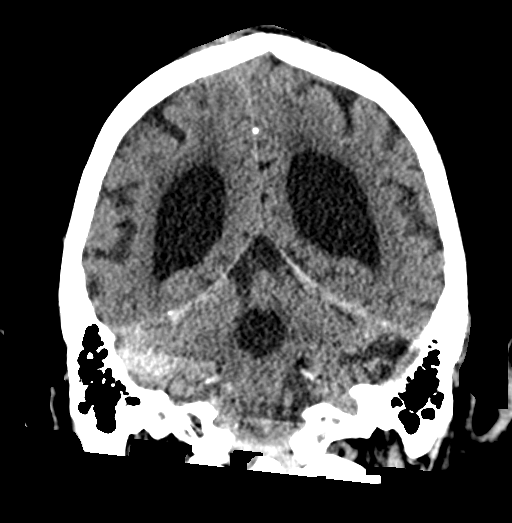
[im 33/73  brain]
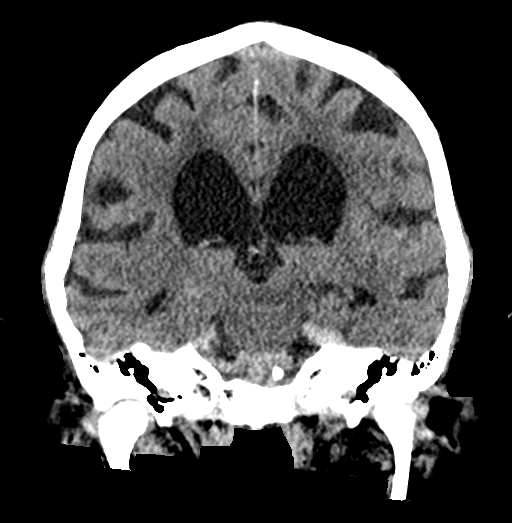
[im 41/73  brain]
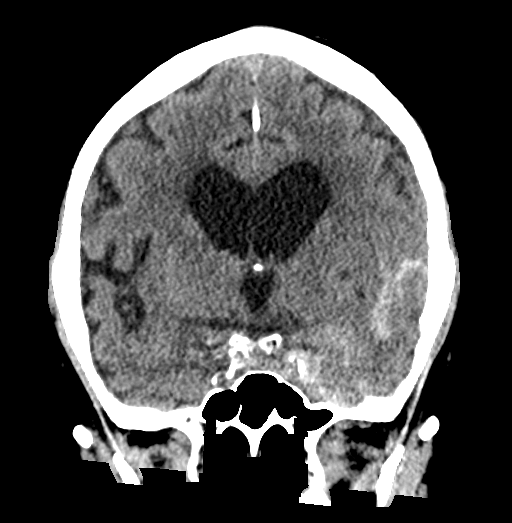

[Series 5: sagittal soft tissue · sagittal · 0.37mm/px · 3 of 54 slices shown]
[im 18/54  brain]
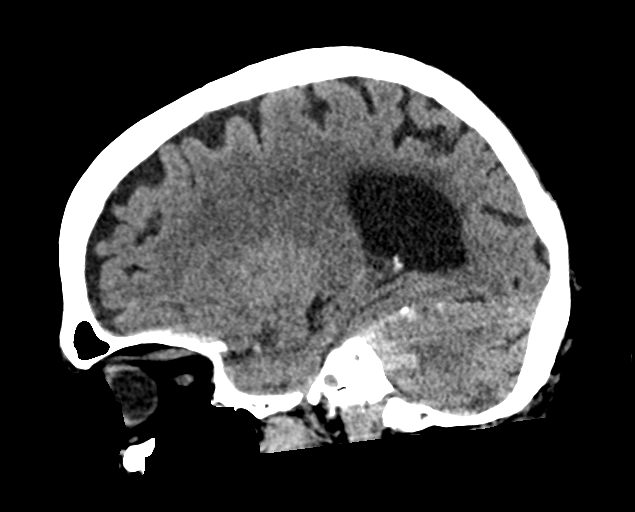
[im 27/54  brain]
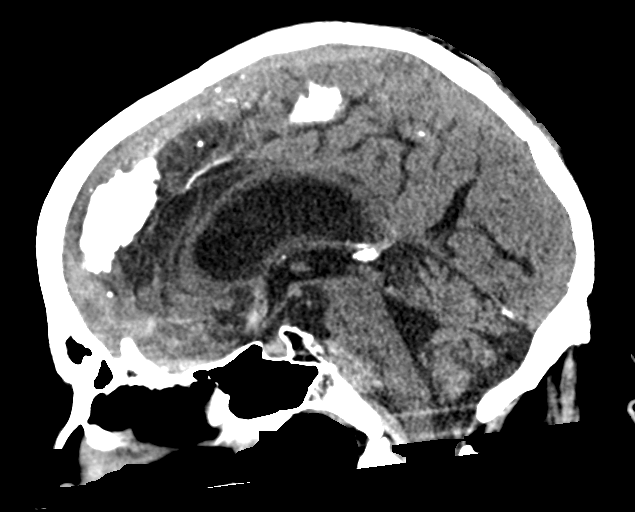
[im 36/54  brain]
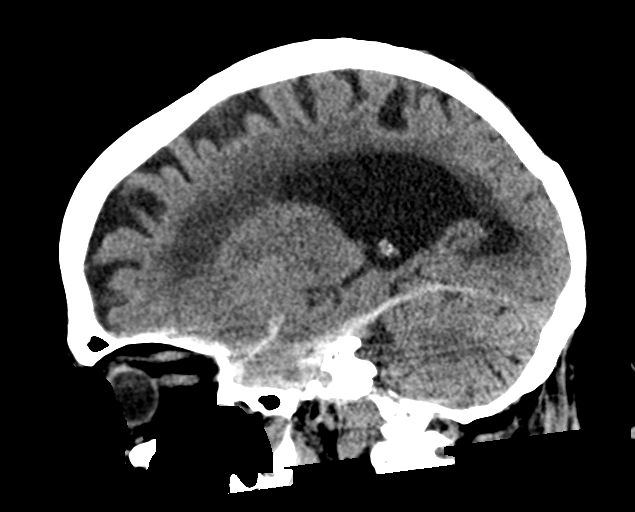

[16 of 47 positions shown; findings below may reference images not displayed]

FINDINGS: CT HEAD FINDINGS

Brain: There are changes consistent with subarachnoid hemorrhage
primarily within the sylvian fissure on the left but extending into
the basilar cisterns as well as along the tentorium bilaterally
worse on the right than the left. No acute infarct or
space-occupying mass lesion is seen. Chronic atrophic and ischemic
changes are noted.

Vascular: No hyperdense vessel or unexpected calcification.

Skull: Normal. Negative for fracture or focal lesion.

Sinuses/Orbits: No acute finding.

Other: None.

CT CERVICAL SPINE FINDINGS

Alignment: Within normal limits.

Skull base and vertebrae: 7 cervical segments are well visualized.
Vertebral body height is well maintained. Multilevel facet
hypertrophic changes are noted. Disc space narrowing at C5-6 with
mild osteophytic changes is seen. Mild osteophytic changes are noted
at C6-7 and C7-T1 as well. The odontoid is within normal limits. No
acute fracture or acute facet abnormality is noted.

Soft tissues and spinal canal: Surrounding soft tissue structures
are within normal limits.

Upper chest: Visualized lung apices are within normal limits.

Other: None
IMPRESSION: CT of the head: Diffuse subarachnoid hemorrhage as described above
concentrated predominately in the left sylvian fissure. These
changes are disproportionate to the recent injury and raise
suspicion for left MCA aneurysm rupture. CTA of the head is
recommended for further evaluation.

Chronic atrophic and ischemic changes are noted.

CT of the cervical spine: Degenerative change without acute
abnormality.

Critical Value/emergent results were called by telephone at the time
of interpretation on [DATE] at [DATE] to Dr. MONCHU , who
verbally acknowledged these results.

## 2021-11-17 IMAGING — CT CT HEAD W/O CM
5 of 10 series · 15 of 47 positions shown, 16 images · non-contrast
Comparison: CT from earlier today

CLINICAL DATA: Head trauma with worsening condition



[Series 2: head wo · axial · 0.54mm/px · z∈[-130,-75]mm · 2 of 35 slices shown (1 of 2)]
[im 12/35  brain]
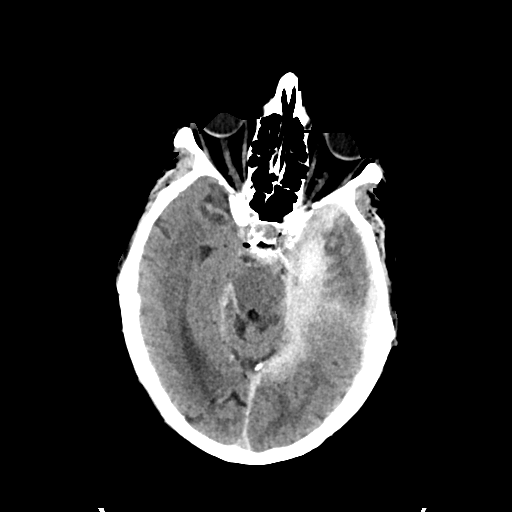
[im 23/35  brain]
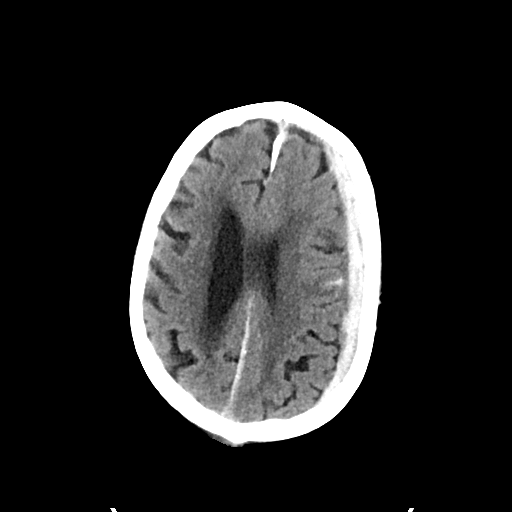

[Series 4: cor soft · coronal · 0.35mm/px · 3 of 71 slices shown]
[im 18/71  brain]
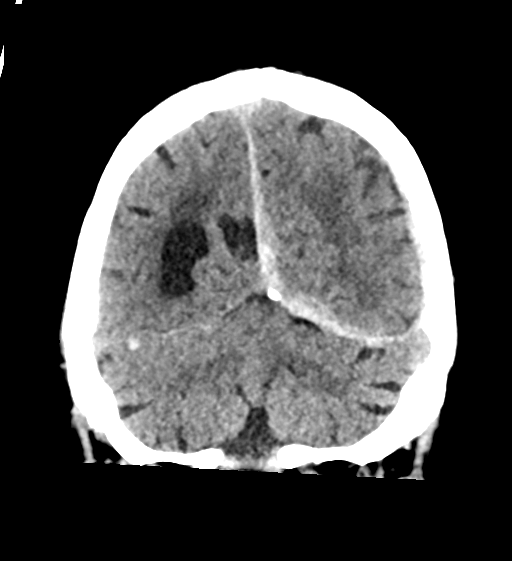
[im 36/71  brain]
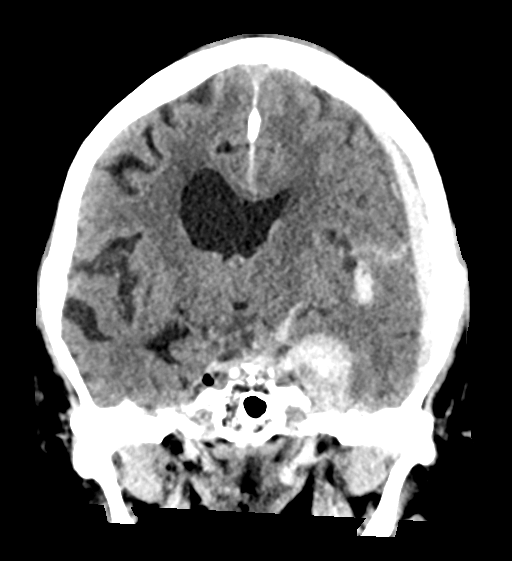
[im 53/71  brain]
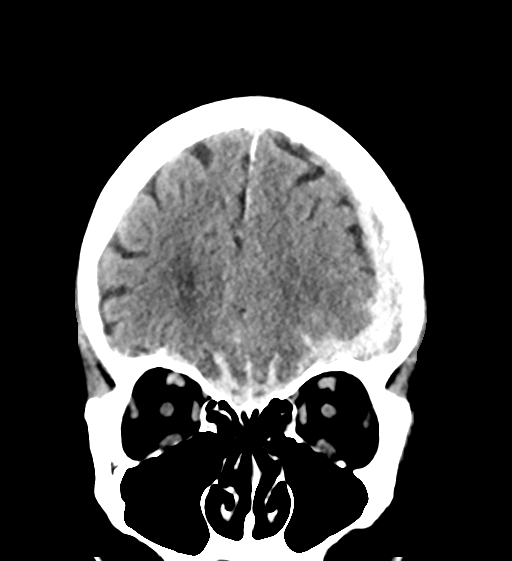

[Series 8: head bone · axial · 0.51mm/px · z∈[-159,-29]mm · 6 of 92 slices shown]
[im 14/92  bone]
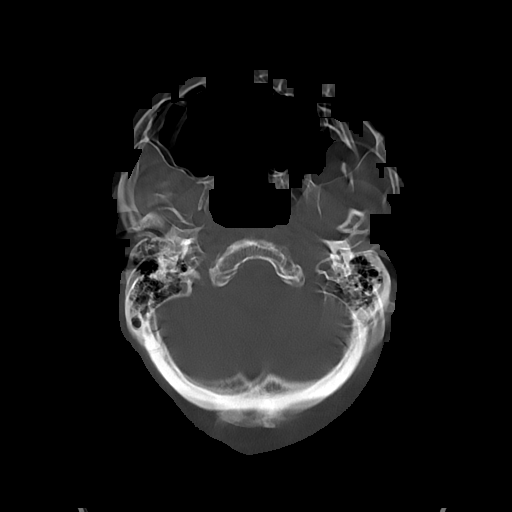
[im 27/92  bone]
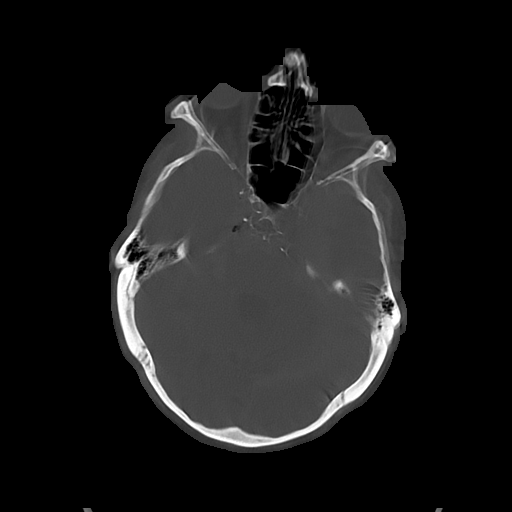
[im 40/92  bone]
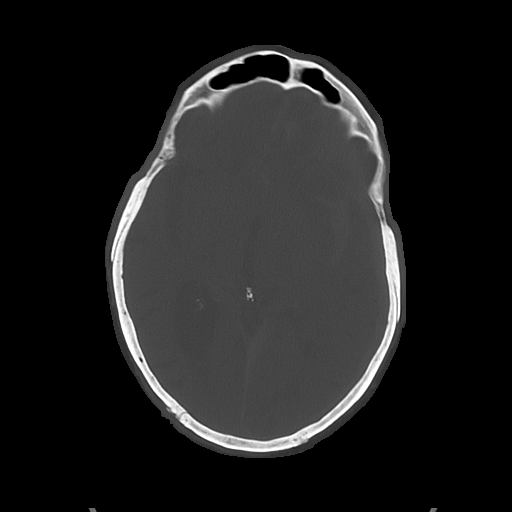
[im 53/92  bone]
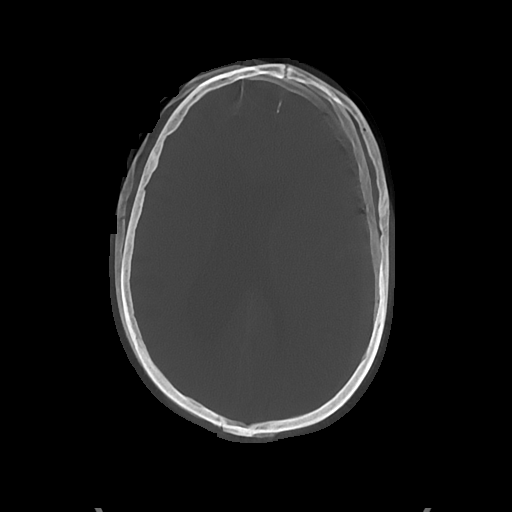
[im 66/92  bone]
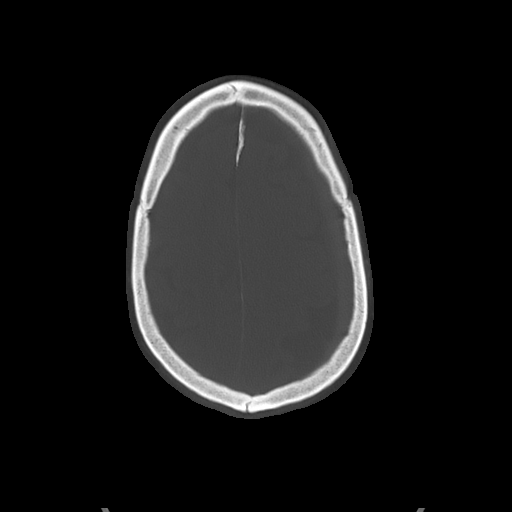
[im 79/92  bone]
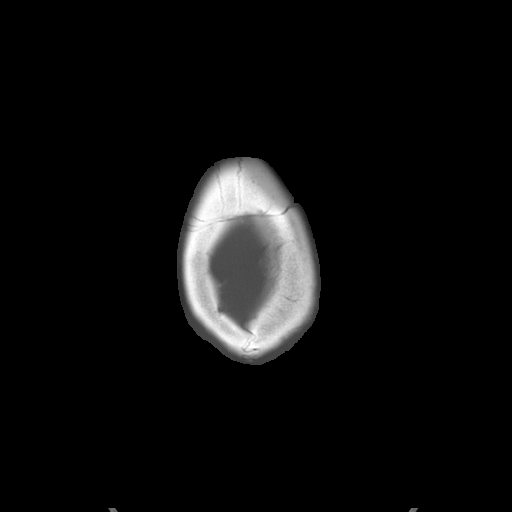

[Series 9: head wo · axial · 0.39mm/px · z∈[-110,-52]mm · 2 of 38 slices shown, 3 images (2 of 2)]
[im 13/38  brain]
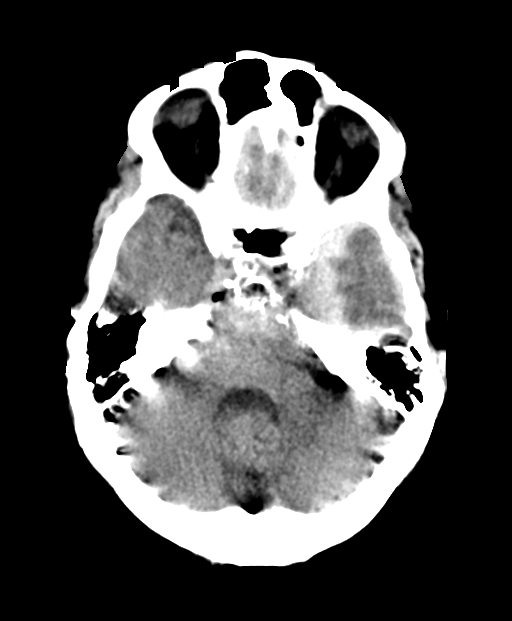
[im 13/38  bone]
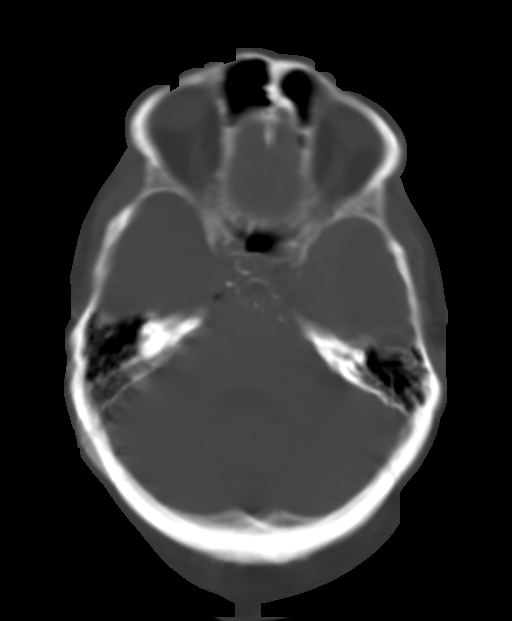
[im 25/38  brain]
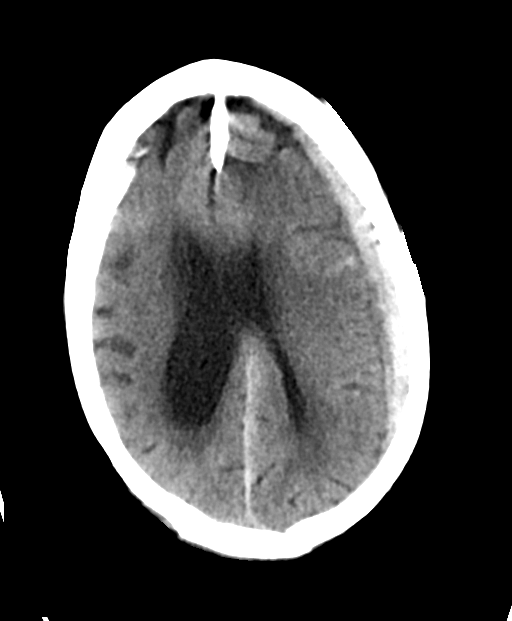

[Series 11: sag soft · sagittal · 0.38mm/px · 2 of 53 slices shown]
[im 18/53  brain]
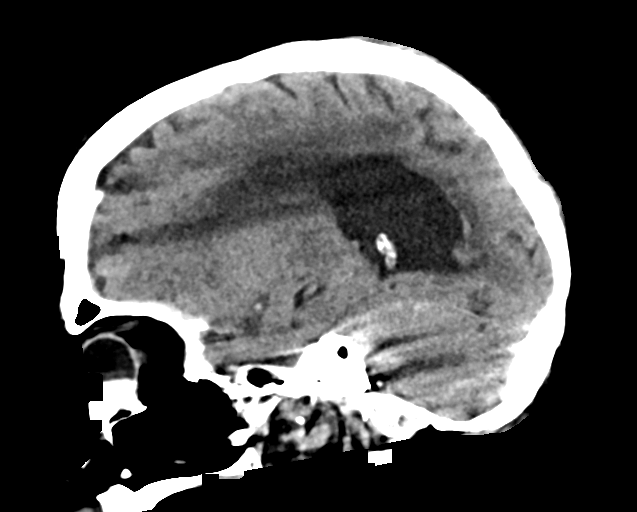
[im 35/53  brain]
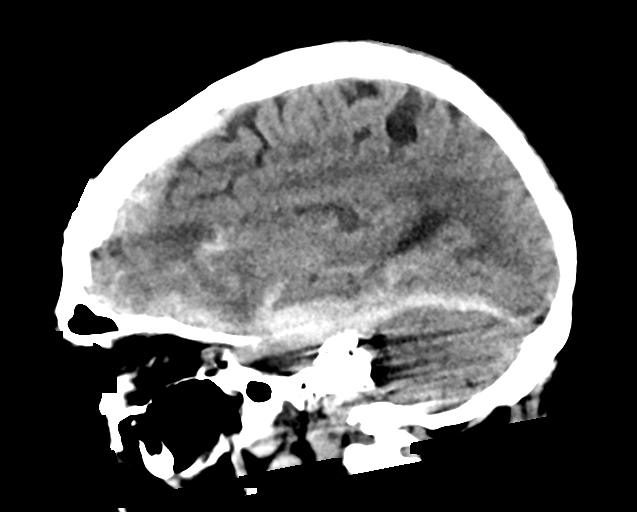

[15 of 47 positions shown; findings below may reference images not displayed]

FINDINGS: Brain: Newly seen subdural hematoma on the left which involves the
majority of the left cerebral convexity and measures up to 1 cm in
thickness. The hematoma is primarily high-density. Trace subdural
hematoma at the anterior right frontal convexity. There has been
some increased thickness of subarachnoid hemorrhage which is mainly
seen in the prepontine, right CP angle and right ambient cisterns
and on the left at the lower sylvian fissure. CTA was performed
earlier tonight. Bilateral inferior frontal and left anterior
temporal low-density attributed to contusions. There is midline
shift towards the right measuring 7 mm, primarily related to the
subdural hematoma. No infarct. Ventriculomegaly with effacement on
the left.

Vascular: Atheromatous calcification

Skull: Negative for calvarial fracture

Sinuses/Orbits: Negative

Other: Critical Value/emergent results were called by telephone at
the time of interpretation on [DATE] at [DATE] to provider
RONLOR , who verbally acknowledged these results.
IMPRESSION: 1. New subdural hematoma along the majority of the left cerebral
convexity measuring up to 1 cm in thickness and primarily
responsible for new 7 mm midline shift.
2. Some thickening of subarachnoid hemorrhage although stable
pattern.
3. Trace subdural hematoma along the anterior right frontal
convexity.
4. Early visualization of cerebral contusion in the bilateral
inferior frontal and left temporal lobes.

## 2021-11-17 IMAGING — DX DG ABDOMEN 1V
1 series · 1 of 1 positions shown · non-contrast
Comparison: No priors.

CLINICAL DATA: 80-year-old male status post orogastric tube
placement.

EXAM:
ABDOMEN - 1 VIEW

[abdomen supine]
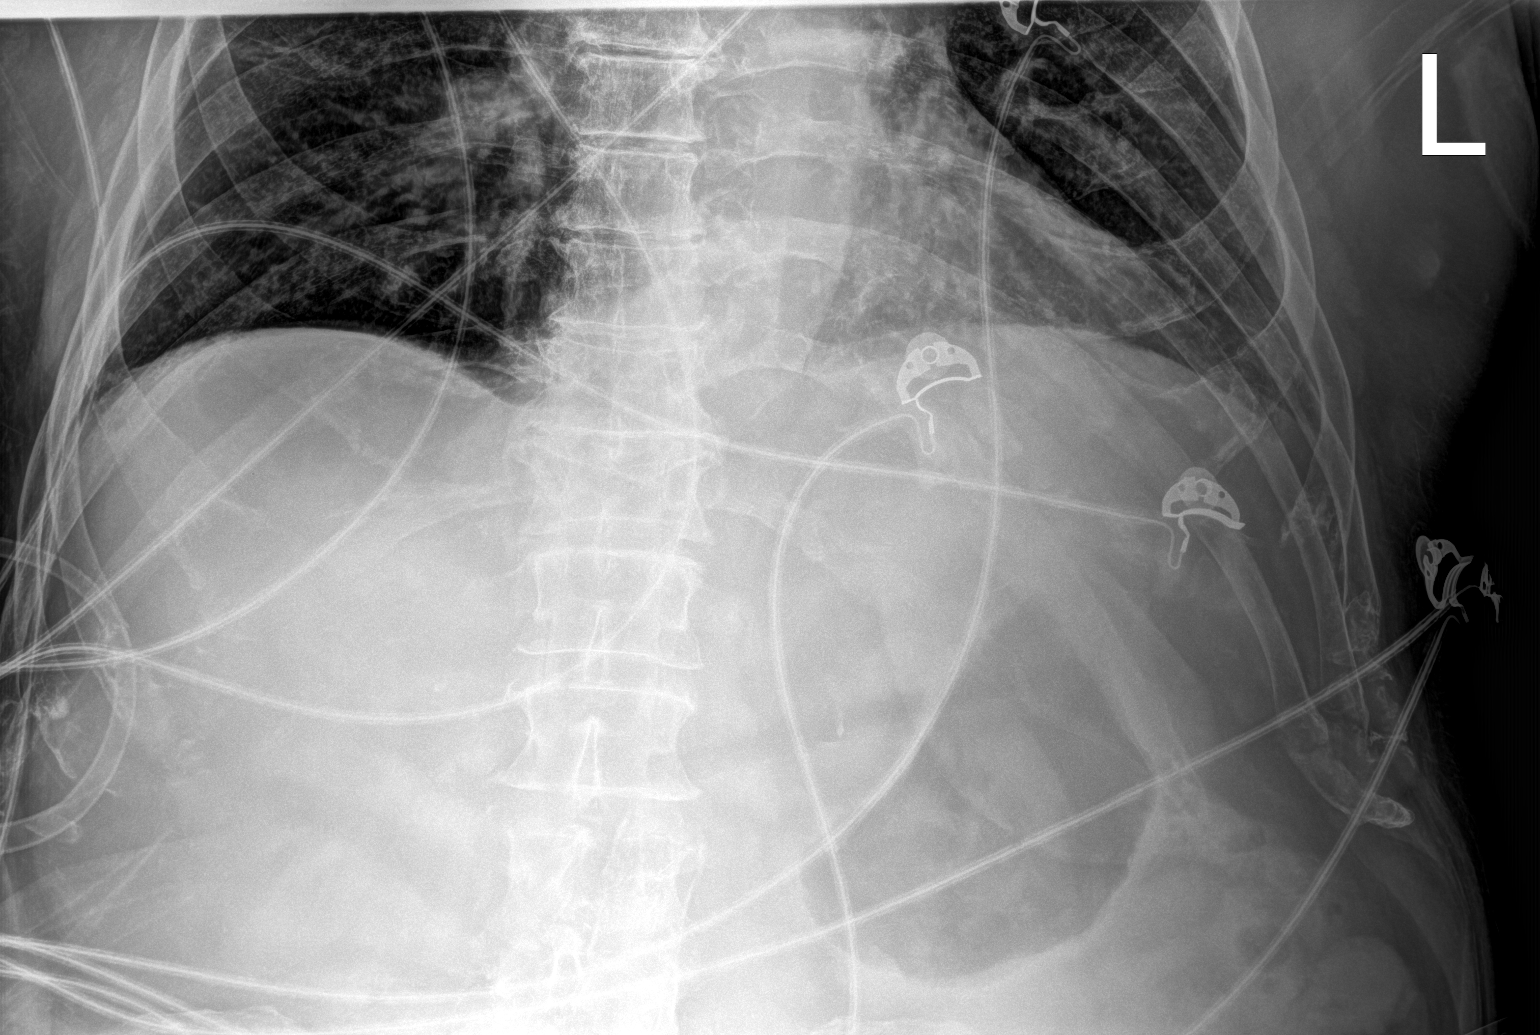

[1 of 1 positions shown; findings below may reference images not displayed]

FINDINGS: No enteric tube is noted. Contemporaneously obtained chest x-ray
demonstrated in enteric tube at the junction of the proximal and mid
esophagus.
IMPRESSION: 1. Enteric tube not visualized, presumably in the proximal to mid
esophagus as demonstrated on contemporaneously obtained chest x-ray.

## 2021-11-17 IMAGING — CT CT CERVICAL SPINE W/O CM
3 of 4 series · 11 of 33 positions shown, 13 images · non-contrast
Comparison: None Available.

CLINICAL DATA: Recent fall with altered mental status



[Series 6: sagittal bone · sagittal · 0.26mm/px · 5 of 61 slices shown, 6 images]
[im 21/61  bone]
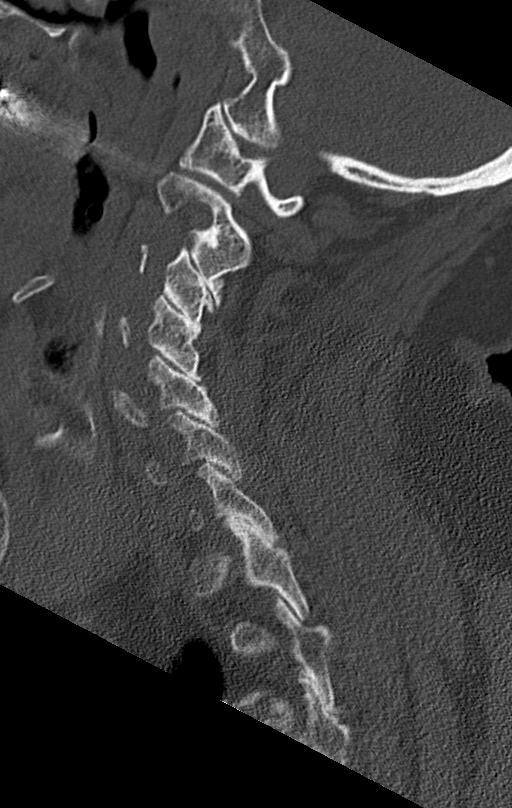
[im 26/61  bone]
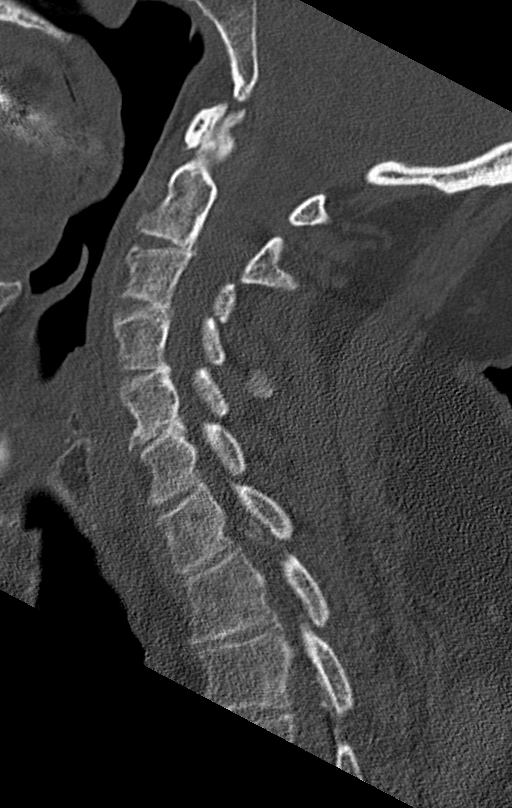
[im 31/61  soft-tissue]
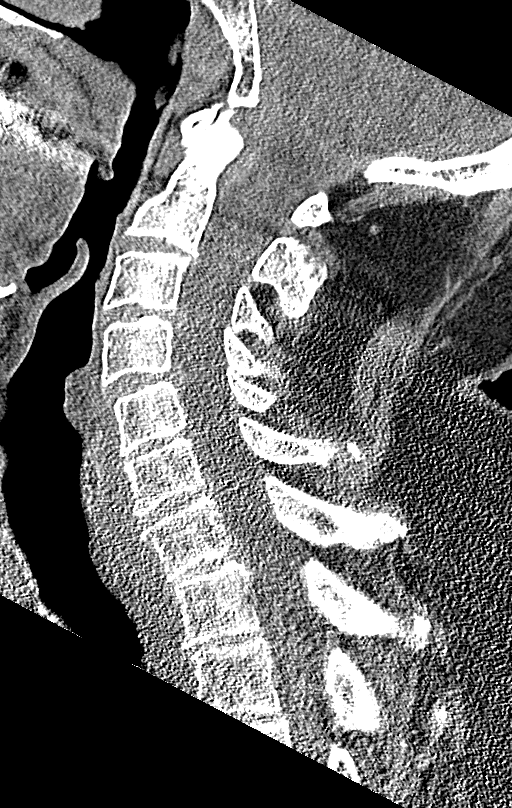
[im 31/61  bone]
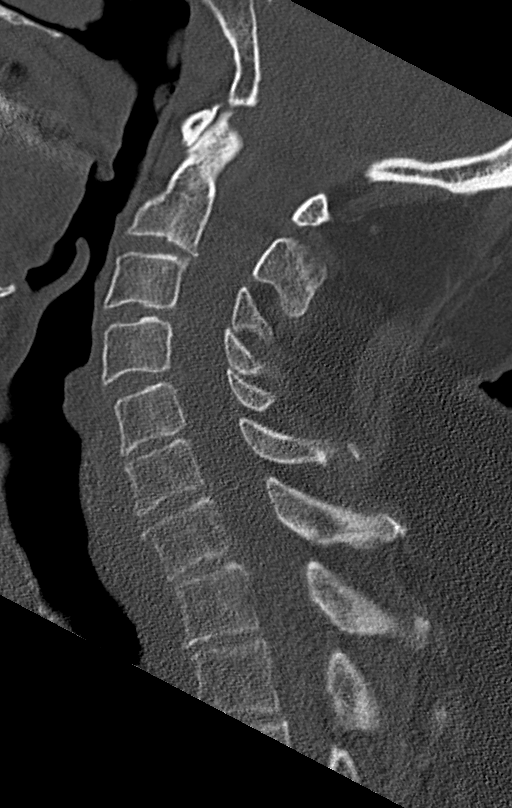
[im 36/61  bone]
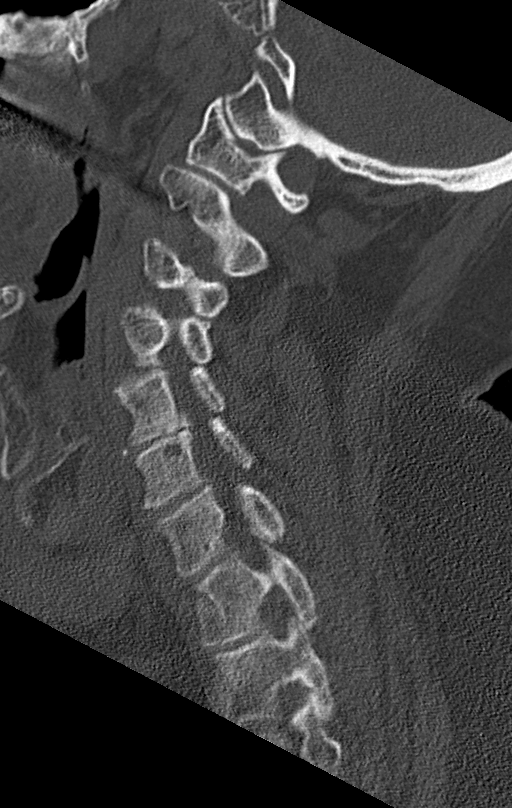
[im 41/61  bone]
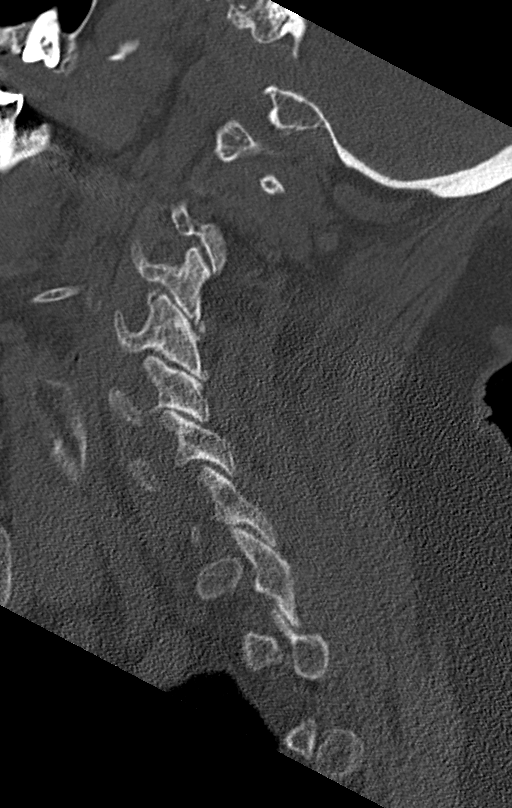

[Series 7: coronal bone · coronal · 0.24mm/px · 3 of 64 slices shown]
[im 17/64  bone]
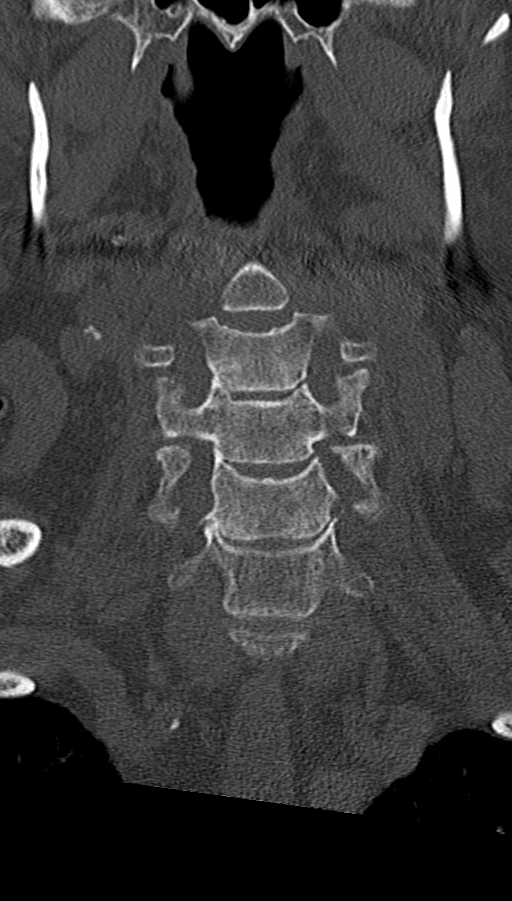
[im 27/64  bone]
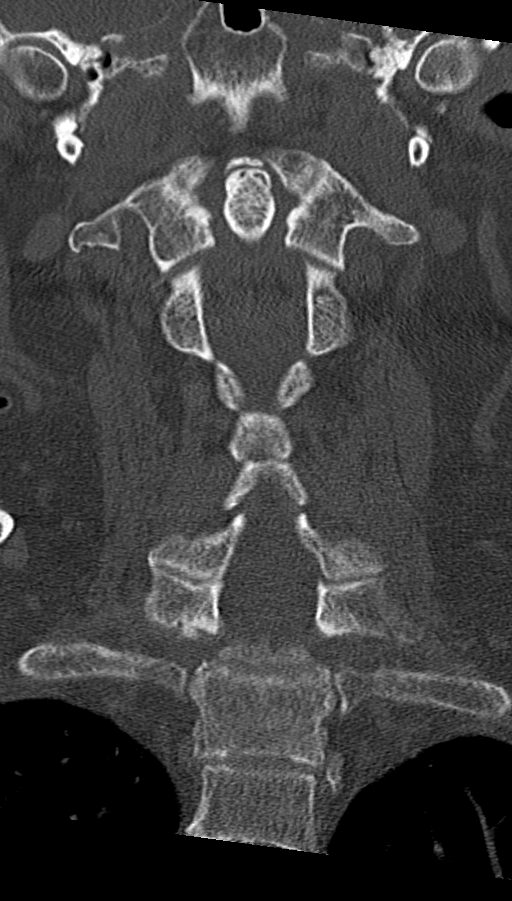
[im 37/64  bone]
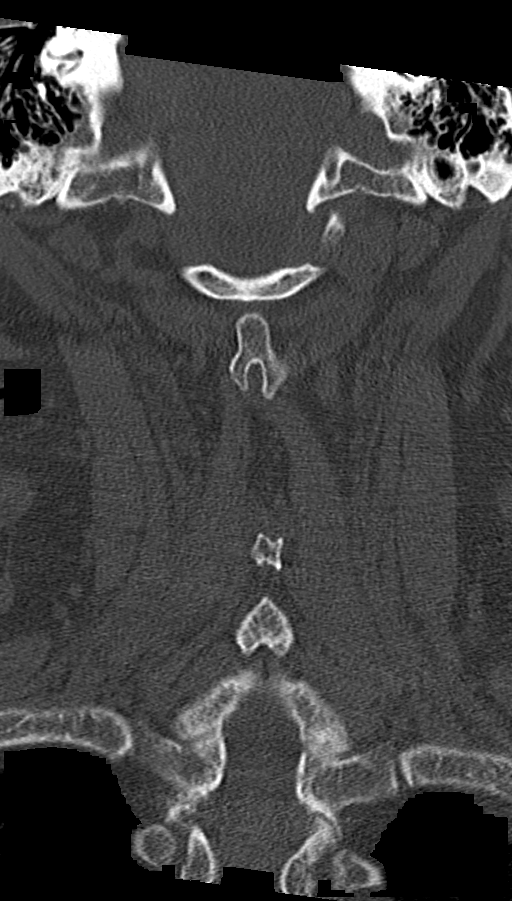

[Series 8: orthogonal bone · axial · 0.24mm/px · z∈[+158,+263]mm · 3 of 107 slices shown, 4 images]
[im 31/107  soft-tissue]
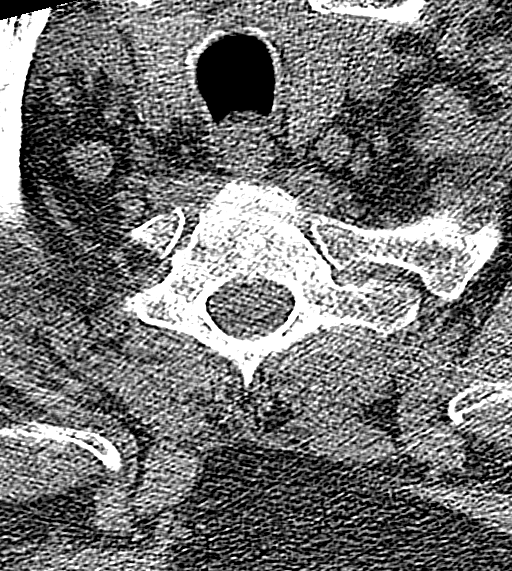
[im 31/107  bone]
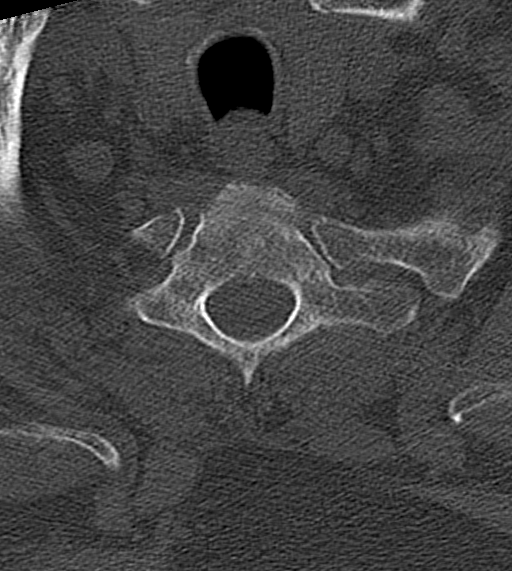
[im 61/107  bone]
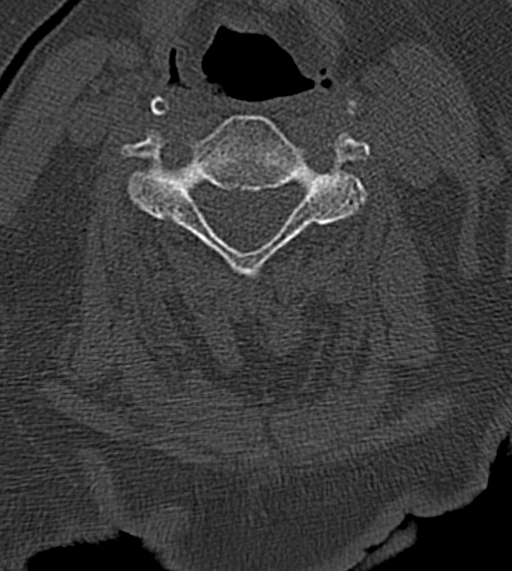
[im 91/107  bone]
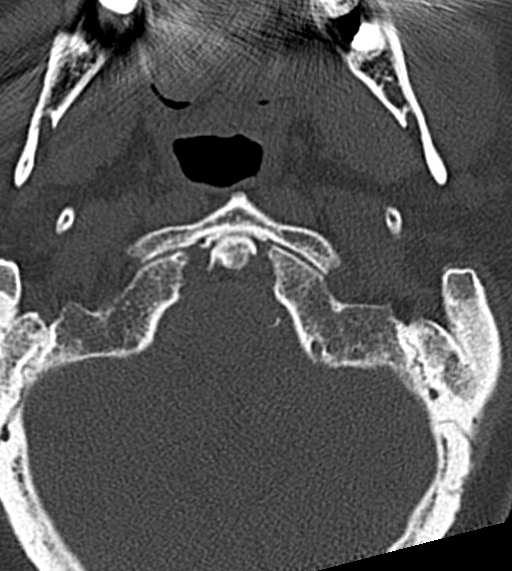

[11 of 33 positions shown; findings below may reference images not displayed]

FINDINGS: CT HEAD FINDINGS

Brain: There are changes consistent with subarachnoid hemorrhage
primarily within the sylvian fissure on the left but extending into
the basilar cisterns as well as along the tentorium bilaterally
worse on the right than the left. No acute infarct or
space-occupying mass lesion is seen. Chronic atrophic and ischemic
changes are noted.

Vascular: No hyperdense vessel or unexpected calcification.

Skull: Normal. Negative for fracture or focal lesion.

Sinuses/Orbits: No acute finding.

Other: None.

CT CERVICAL SPINE FINDINGS

Alignment: Within normal limits.

Skull base and vertebrae: 7 cervical segments are well visualized.
Vertebral body height is well maintained. Multilevel facet
hypertrophic changes are noted. Disc space narrowing at C5-6 with
mild osteophytic changes is seen. Mild osteophytic changes are noted
at C6-7 and C7-T1 as well. The odontoid is within normal limits. No
acute fracture or acute facet abnormality is noted.

Soft tissues and spinal canal: Surrounding soft tissue structures
are within normal limits.

Upper chest: Visualized lung apices are within normal limits.

Other: None
IMPRESSION: CT of the head: Diffuse subarachnoid hemorrhage as described above
concentrated predominately in the left sylvian fissure. These
changes are disproportionate to the recent injury and raise
suspicion for left MCA aneurysm rupture. CTA of the head is
recommended for further evaluation.

Chronic atrophic and ischemic changes are noted.

CT of the cervical spine: Degenerative change without acute
abnormality.

Critical Value/emergent results were called by telephone at the time
of interpretation on [DATE] at [DATE] to Dr. MONCHU , who
verbally acknowledged these results.

## 2021-11-17 IMAGING — DX DG CHEST 1V PORT
1 series · 1 of 1 positions shown · non-contrast
Comparison: No priors.

CLINICAL DATA: 80-year-old male status post intubation for hypoxia.

EXAM:
PORTABLE CHEST 1 VIEW

[chest ap]
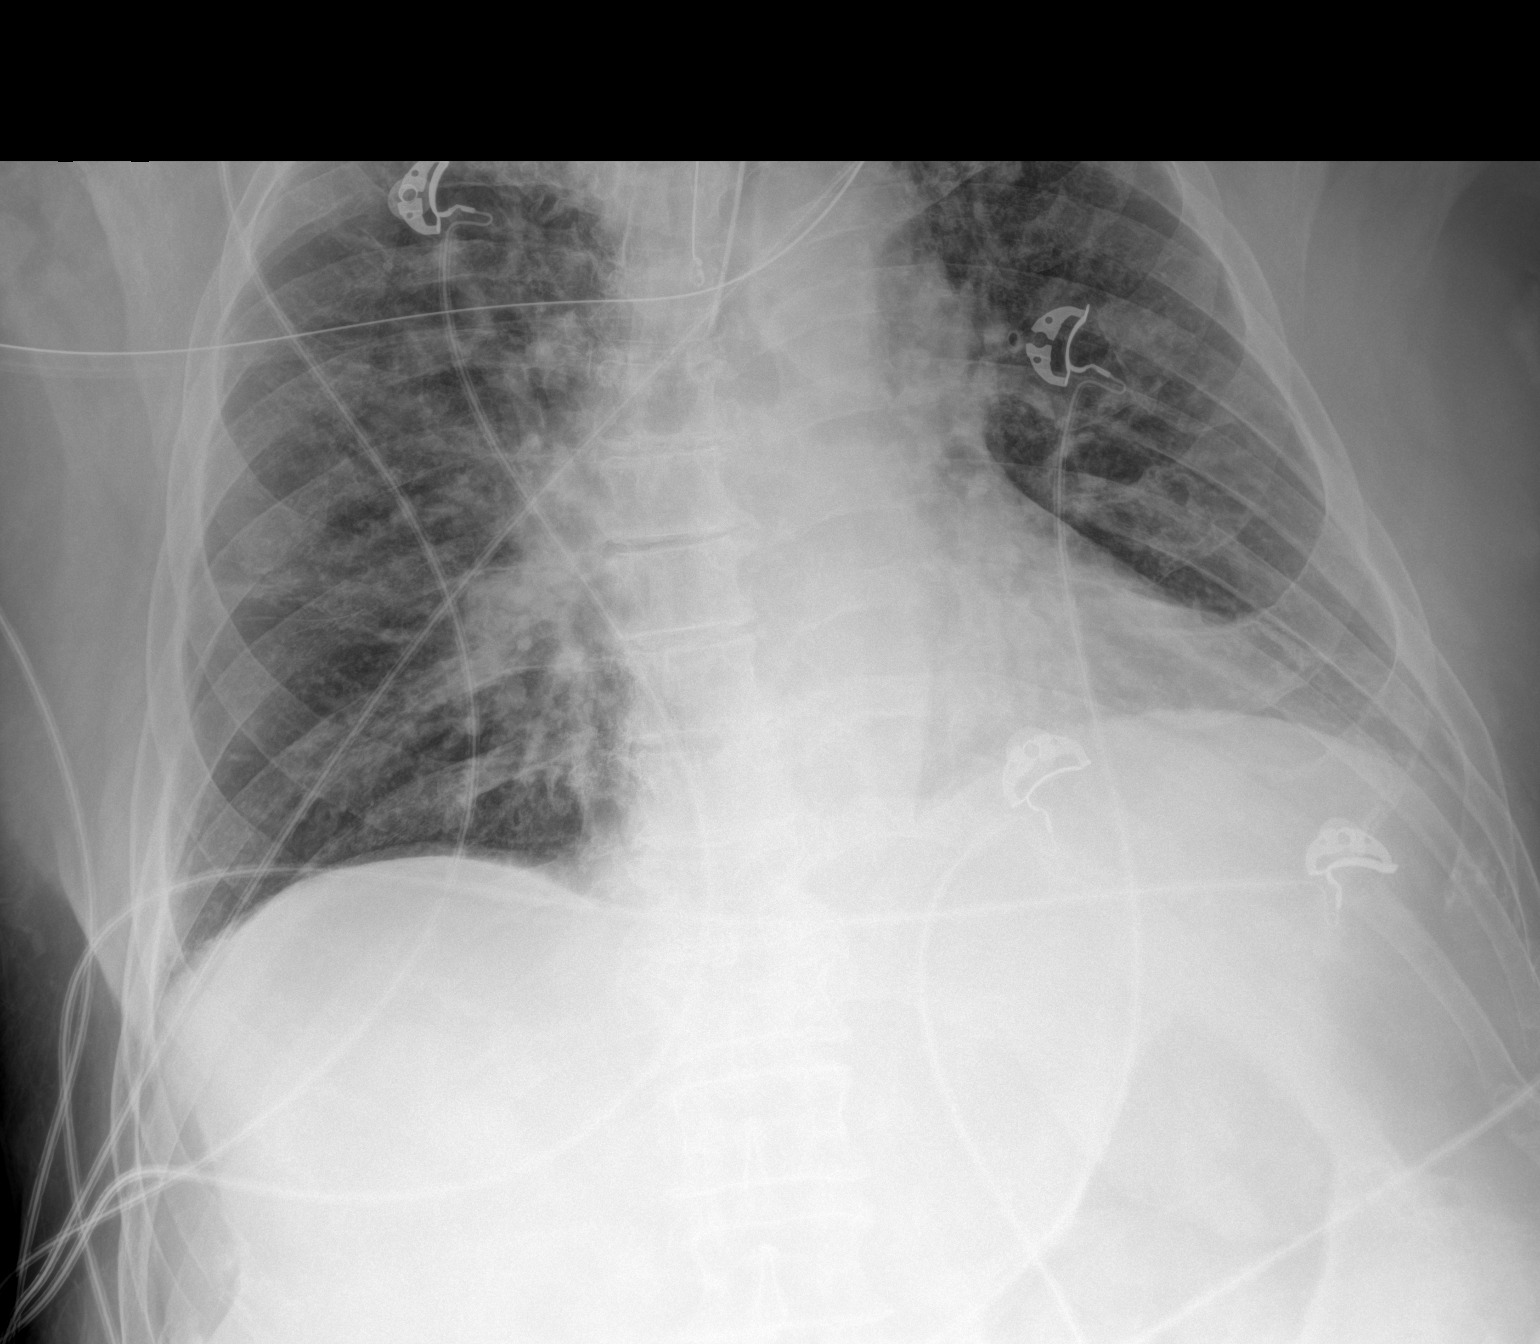

[1 of 1 positions shown; findings below may reference images not displayed]

FINDINGS: An endotracheal tube is in place with tip 1.2 cm above the carina.
Tip of enteric tube is noted in the proximal to mid esophagus. Lung
volumes are low. Ill-defined opacities are noted in the lungs, most
evident in the central aspect of the lungs, favored to be related to
low lung volumes and areas of passive atelectasis. No confluent
consolidative airspace disease. No pleural effusions. No
pneumothorax. No evidence of pulmonary edema. Heart size is normal.
The patient is rotated to the left on today's exam, resulting in
distortion of the mediastinal contours and reduced diagnostic
sensitivity and specificity for mediastinal pathology.
IMPRESSION: 1. Support apparatus, as above. Please take note of the high
position of the enteric tube.
2. Low lung volumes with probable areas of passive subsegmental
atelectasis, as above.

## 2021-11-17 SURGERY — CRANIOTOMY HEMATOMA EVACUATION SUBDURAL
Anesthesia: General | Site: Head | Laterality: Left

## 2021-11-17 MED ORDER — CHLORDIAZEPOXIDE HCL 25 MG PO CAPS: 25.0000 mg | ORAL_CAPSULE | Freq: Four times a day (QID) | ORAL | Status: DC | PRN

## 2021-11-17 MED ORDER — FENTANYL BOLUS VIA INFUSION
25.0000 ug | INTRAVENOUS | Status: DC | PRN
  Administered 2021-11-17: 75 ug via INTRAVENOUS
  Administered 2021-11-17: 25 ug via INTRAVENOUS
  Administered 2021-11-17: 50 ug via INTRAVENOUS
  Filled 2021-11-17: qty 100

## 2021-11-17 MED ORDER — ONDANSETRON HCL 4 MG/2ML IJ SOLN: 4.0000 mg | INTRAMUSCULAR | Status: DC | PRN

## 2021-11-17 MED ORDER — TRAZODONE HCL 50 MG PO TABS: 25.0000 mg | ORAL_TABLET | Freq: Every evening | ORAL | Status: DC | PRN

## 2021-11-17 MED ORDER — LEVETIRACETAM IN NACL 1000 MG/100ML IV SOLN
1000.0000 mg | Freq: Once | INTRAVENOUS | Status: AC
  Administered 2021-11-17: 1000 mg via INTRAVENOUS
  Filled 2021-11-17: qty 100

## 2021-11-17 MED ORDER — ADULT MULTIVITAMIN W/MINERALS CH
1.0000 | ORAL_TABLET | Freq: Every day | ORAL | Status: DC
  Administered 2021-11-22 – 2021-11-25 (×4): 1
  Filled 2021-11-17 (×5): qty 1

## 2021-11-17 MED ORDER — LINAGLIPTIN 5 MG PO TABS: 5.0000 mg | ORAL_TABLET | Freq: Every day | ORAL | Status: DC

## 2021-11-17 MED ORDER — INSULIN ASPART 100 UNIT/ML IJ SOLN
0.0000 [IU] | INTRAMUSCULAR | Status: DC
  Administered 2021-11-17 (×2): 3 [IU] via SUBCUTANEOUS
  Administered 2021-11-17: 5 [IU] via SUBCUTANEOUS
  Administered 2021-11-17 – 2021-11-18 (×2): 2 [IU] via SUBCUTANEOUS
  Administered 2021-11-18 (×2): 3 [IU] via SUBCUTANEOUS
  Administered 2021-11-19: 5 [IU] via SUBCUTANEOUS
  Administered 2021-11-19: 3 [IU] via SUBCUTANEOUS
  Administered 2021-11-19: 5 [IU] via SUBCUTANEOUS
  Administered 2021-11-19: 3 [IU] via SUBCUTANEOUS
  Administered 2021-11-19: 2 [IU] via SUBCUTANEOUS
  Administered 2021-11-19: 3 [IU] via SUBCUTANEOUS
  Filled 2021-11-17 (×12): qty 1

## 2021-11-17 MED ORDER — SODIUM CHLORIDE 0.9 % IV SOLN
250.0000 mg | Freq: Every day | INTRAVENOUS | Status: AC
  Administered 2021-11-18 – 2021-11-19 (×2): 250 mg via INTRAVENOUS
  Filled 2021-11-17 (×3): qty 2.5

## 2021-11-17 MED ORDER — LORATADINE 10 MG PO TABS
10.0000 mg | ORAL_TABLET | Freq: Every day | ORAL | Status: DC
  Administered 2021-11-22 – 2021-11-28 (×7): 10 mg
  Filled 2021-11-17 (×8): qty 1

## 2021-11-17 MED ORDER — CHLORDIAZEPOXIDE HCL 25 MG PO CAPS: 25.0000 mg | ORAL_CAPSULE | ORAL | Status: DC

## 2021-11-17 MED ORDER — POTASSIUM CHLORIDE 10 MEQ/100ML IV SOLN: 10.0000 meq | INTRAVENOUS | Status: AC

## 2021-11-17 MED ORDER — SODIUM CHLORIDE 0.9 % IV SOLN: INTRAVENOUS | Status: DC

## 2021-11-17 MED ORDER — FENTANYL 2500MCG IN NS 250ML (10MCG/ML) PREMIX INFUSION
25.0000 ug/h | INTRAVENOUS | Status: DC
  Administered 2021-11-17 (×2): 25 ug/h via INTRAVENOUS
  Administered 2021-11-18 – 2021-11-19 (×2): 100 ug/h via INTRAVENOUS
  Filled 2021-11-17 (×4): qty 250

## 2021-11-17 MED ORDER — CHLORDIAZEPOXIDE HCL 25 MG PO CAPS: 25.0000 mg | ORAL_CAPSULE | Freq: Every day | ORAL | Status: DC

## 2021-11-17 MED ORDER — BUPIVACAINE-EPINEPHRINE (PF) 0.5% -1:200000 IJ SOLN
INTRAMUSCULAR | Status: AC
  Filled 2021-11-17: qty 30

## 2021-11-17 MED ORDER — BUPIVACAINE-EPINEPHRINE (PF) 0.5% -1:200000 IJ SOLN
INTRAMUSCULAR | Status: DC | PRN
  Administered 2021-11-17: 10 mL

## 2021-11-17 MED ORDER — VITAMIN D 25 MCG (1000 UNIT) PO TABS
2000.0000 [IU] | ORAL_TABLET | Freq: Every day | ORAL | Status: DC
  Administered 2021-11-22 – 2021-11-28 (×7): 2000 [IU]
  Filled 2021-11-17 (×8): qty 2

## 2021-11-17 MED ORDER — PHENYLEPHRINE HCL-NACL 20-0.9 MG/250ML-% IV SOLN
25.0000 ug/min | INTRAVENOUS | Status: DC
  Administered 2021-11-17: 25 ug/min via INTRAVENOUS
  Administered 2021-11-18: 30 ug/min via INTRAVENOUS
  Filled 2021-11-17 (×2): qty 250

## 2021-11-17 MED ORDER — ONDANSETRON HCL 4 MG/2ML IJ SOLN
4.0000 mg | Freq: Once | INTRAMUSCULAR | Status: DC
  Filled 2021-11-17: qty 2

## 2021-11-17 MED ORDER — ACETAMINOPHEN 160 MG/5ML PO SOLN
650.0000 mg | ORAL | Status: DC | PRN
  Administered 2021-11-21 – 2021-11-22 (×3): 650 mg
  Filled 2021-11-17 (×6): qty 20.3

## 2021-11-17 MED ORDER — CHLORHEXIDINE GLUCONATE CLOTH 2 % EX PADS
6.0000 | MEDICATED_PAD | Freq: Every day | CUTANEOUS | Status: DC
  Administered 2021-11-17 – 2021-11-28 (×12): 6 via TOPICAL

## 2021-11-17 MED ORDER — FREE WATER
30.0000 mL | Status: DC
  Administered 2021-11-18 – 2021-11-21 (×16): 30 mL

## 2021-11-17 MED ORDER — OMEGA-3-ACID ETHYL ESTERS 1 G PO CAPS
1.0000 g | ORAL_CAPSULE | Freq: Every day | ORAL | Status: DC
Start: 2021-11-17 — End: 2021-11-17

## 2021-11-17 MED ORDER — LEVETIRACETAM IN NACL 500 MG/100ML IV SOLN
500.0000 mg | Freq: Two times a day (BID) | INTRAVENOUS | Status: DC
  Administered 2021-11-17 – 2021-11-23 (×12): 500 mg via INTRAVENOUS
  Filled 2021-11-17 (×13): qty 100

## 2021-11-17 MED ORDER — PHENYLEPHRINE HCL (PRESSORS) 10 MG/ML IV SOLN
INTRAVENOUS | Status: AC
  Filled 2021-11-17: qty 1

## 2021-11-17 MED ORDER — VITAL HIGH PROTEIN PO LIQD: 1000.0000 mL | ORAL | Status: DC

## 2021-11-17 MED ORDER — ATORVASTATIN CALCIUM 20 MG PO TABS: 40.0000 mg | ORAL_TABLET | Freq: Every day | ORAL | Status: DC

## 2021-11-17 MED ORDER — PROPOFOL 10 MG/ML IV BOLUS
INTRAVENOUS | Status: AC
  Filled 2021-11-17: qty 20

## 2021-11-17 MED ORDER — CHLORHEXIDINE GLUCONATE 0.12% ORAL RINSE (MEDLINE KIT)
15.0000 mL | Freq: Two times a day (BID) | OROMUCOSAL | Status: DC
  Administered 2021-11-17 – 2021-11-28 (×22): 15 mL via OROMUCOSAL
  Filled 2021-11-17: qty 15

## 2021-11-17 MED ORDER — HEMOSTATIC AGENTS (NO CHARGE) OPTIME
TOPICAL | Status: DC | PRN
  Administered 2021-11-17: 1 via TOPICAL

## 2021-11-17 MED ORDER — POLYETHYLENE GLYCOL 3350 17 G PO PACK
17.0000 g | PACK | Freq: Every day | ORAL | Status: DC
  Administered 2021-11-19 – 2021-11-27 (×5): 17 g
  Filled 2021-11-17 (×6): qty 1

## 2021-11-17 MED ORDER — FENTANYL CITRATE PF 50 MCG/ML IJ SOSY
100.0000 ug | PREFILLED_SYRINGE | Freq: Once | INTRAMUSCULAR | Status: AC
  Administered 2021-11-17: 100 ug via INTRAVENOUS
  Filled 2021-11-17: qty 2

## 2021-11-17 MED ORDER — PANTOPRAZOLE SODIUM 40 MG IV SOLR: 40.0000 mg | Freq: Every day | INTRAVENOUS | Status: DC

## 2021-11-17 MED ORDER — POTASSIUM CHLORIDE 20 MEQ PO PACK: 40.0000 meq | PACK | Freq: Once | ORAL | Status: DC

## 2021-11-17 MED ORDER — IOHEXOL 350 MG/ML SOLN
75.0000 mL | Freq: Once | INTRAVENOUS | Status: AC | PRN
  Administered 2021-11-17: 75 mL via INTRAVENOUS

## 2021-11-17 MED ORDER — GELATIN ABSORBABLE 12-7 MM EX MISC
CUTANEOUS | Status: AC
  Filled 2021-11-17: qty 1

## 2021-11-17 MED ORDER — SODIUM CHLORIDE 0.9 % IV SOLN: INTRAVENOUS | Status: DC | PRN

## 2021-11-17 MED ORDER — PHENYLEPHRINE HCL-NACL 20-0.9 MG/250ML-% IV SOLN: 0.0000 ug/min | INTRAVENOUS | Status: DC

## 2021-11-17 MED ORDER — MIDAZOLAM-SODIUM CHLORIDE 100-0.9 MG/100ML-% IV SOLN
INTRAVENOUS | Status: AC
  Filled 2021-11-17: qty 100

## 2021-11-17 MED ORDER — SENNOSIDES-DOCUSATE SODIUM 8.6-50 MG PO TABS: 1.0000 | ORAL_TABLET | Freq: Two times a day (BID) | ORAL | Status: DC

## 2021-11-17 MED ORDER — 0.9 % SODIUM CHLORIDE (POUR BTL) OPTIME
TOPICAL | Status: DC | PRN
  Administered 2021-11-17: 1000 mL
  Administered 2021-11-17: 500 mL
  Administered 2021-11-17: 1000 mL

## 2021-11-17 MED ORDER — PROPOFOL 1000 MG/100ML IV EMUL
5.0000 ug/kg/min | INTRAVENOUS | Status: DC
  Administered 2021-11-17: 5 ug/kg/min via INTRAVENOUS
  Administered 2021-11-17: 15 ug/kg/min via INTRAVENOUS
  Filled 2021-11-17 (×2): qty 100

## 2021-11-17 MED ORDER — DOCUSATE SODIUM 50 MG/5ML PO LIQD
100.0000 mg | Freq: Two times a day (BID) | ORAL | Status: DC
  Administered 2021-11-18 – 2021-11-27 (×11): 100 mg
  Filled 2021-11-17 (×14): qty 10

## 2021-11-17 MED ORDER — PROPOFOL 1000 MG/100ML IV EMUL: 5.0000 ug/kg/min | INTRAVENOUS | Status: DC

## 2021-11-17 MED ORDER — ETOMIDATE 2 MG/ML IV SOLN
INTRAVENOUS | Status: AC
  Filled 2021-11-17: qty 20

## 2021-11-17 MED ORDER — BACITRACIN ZINC 500 UNIT/GM EX OINT
TOPICAL_OINTMENT | CUTANEOUS | Status: AC
  Filled 2021-11-17: qty 28.35

## 2021-11-17 MED ORDER — ROCURONIUM BROMIDE 100 MG/10ML IV SOLN
INTRAVENOUS | Status: DC | PRN
Start: 2021-11-17 — End: 2021-11-17
  Administered 2021-11-17: 30 mg via INTRAVENOUS
  Administered 2021-11-17: 70 mg via INTRAVENOUS
  Administered 2021-11-17: 100 mg via INTRAVENOUS

## 2021-11-17 MED ORDER — SURGIFLO WITH THROMBIN (HEMOSTATIC MATRIX KIT) OPTIME
TOPICAL | Status: DC | PRN
  Administered 2021-11-17: 1 via TOPICAL

## 2021-11-17 MED ORDER — ETOMIDATE 2 MG/ML IV SOLN
INTRAVENOUS | Status: DC | PRN
Start: 2021-11-17 — End: 2021-11-18
  Administered 2021-11-17: 30 mg via INTRAVENOUS

## 2021-11-17 MED ORDER — OMEGA-3-ACID ETHYL ESTERS 1 G PO CAPS
1.0000 g | ORAL_CAPSULE | Freq: Every day | ORAL | Status: DC
  Administered 2021-11-24 – 2021-11-28 (×5): 1 g
  Filled 2021-11-17 (×6): qty 1

## 2021-11-17 MED ORDER — MIDAZOLAM HCL 2 MG/2ML IJ SOLN
1.0000 mg | INTRAMUSCULAR | Status: DC | PRN
  Administered 2021-11-23 (×3): 1 mg via INTRAVENOUS
  Filled 2021-11-17 (×3): qty 2

## 2021-11-17 MED ORDER — CHLORDIAZEPOXIDE HCL 25 MG PO CAPS: 25.0000 mg | ORAL_CAPSULE | Freq: Once | ORAL | Status: DC

## 2021-11-17 MED ORDER — FENTANYL CITRATE (PF) 250 MCG/5ML IJ SOLN
INTRAMUSCULAR | Status: AC
  Filled 2021-11-17: qty 5

## 2021-11-17 MED ORDER — ACETAMINOPHEN 650 MG RE SUPP
650.0000 mg | RECTAL | Status: DC | PRN
  Administered 2021-11-17 – 2021-11-20 (×4): 650 mg via RECTAL
  Filled 2021-11-17 (×4): qty 1

## 2021-11-17 MED ORDER — MIDAZOLAM HCL 2 MG/2ML IJ SOLN: 2.0000 mg | INTRAMUSCULAR | Status: DC | PRN

## 2021-11-17 MED ORDER — BACITRACIN 500 UNIT/GM EX OINT
TOPICAL_OINTMENT | CUTANEOUS | Status: DC | PRN
  Administered 2021-11-17: 1 via TOPICAL

## 2021-11-17 MED ORDER — FOLIC ACID 1 MG PO TABS
1.0000 mg | ORAL_TABLET | Freq: Every day | ORAL | Status: DC
  Administered 2021-11-22 – 2021-11-28 (×7): 1 mg
  Filled 2021-11-17 (×8): qty 1

## 2021-11-17 MED ORDER — STROKE: EARLY STAGES OF RECOVERY BOOK: Freq: Once | Status: DC

## 2021-11-17 MED ORDER — PHENYLEPHRINE 80 MCG/ML (10ML) SYRINGE FOR IV PUSH (FOR BLOOD PRESSURE SUPPORT)
PREFILLED_SYRINGE | INTRAVENOUS | Status: DC | PRN
  Administered 2021-11-17 (×4): 80 ug via INTRAVENOUS

## 2021-11-17 MED ORDER — CHLORDIAZEPOXIDE HCL 25 MG PO CAPS: 25.0000 mg | ORAL_CAPSULE | Freq: Four times a day (QID) | ORAL | Status: DC

## 2021-11-17 MED ORDER — DEXAMETHASONE SODIUM PHOSPHATE 10 MG/ML IJ SOLN
INTRAMUSCULAR | Status: DC | PRN
  Administered 2021-11-17: 10 mg via INTRAVENOUS

## 2021-11-17 MED ORDER — SODIUM CHLORIDE 0.9 % IV SOLN
3.0000 g | Freq: Four times a day (QID) | INTRAVENOUS | Status: DC
  Administered 2021-11-17 – 2021-11-18 (×3): 3 g via INTRAVENOUS
  Filled 2021-11-17: qty 8
  Filled 2021-11-17: qty 3
  Filled 2021-11-17: qty 8
  Filled 2021-11-17: qty 3
  Filled 2021-11-17: qty 8

## 2021-11-17 MED ORDER — LORAZEPAM 2 MG/ML IJ SOLN
1.0000 mg | INTRAMUSCULAR | Status: DC | PRN
  Administered 2021-11-17: 1 mg via INTRAVENOUS
  Filled 2021-11-17: qty 1

## 2021-11-17 MED ORDER — LISINOPRIL-HYDROCHLOROTHIAZIDE 10-12.5 MG PO TABS: 1.0000 | ORAL_TABLET | Freq: Every day | ORAL | Status: DC

## 2021-11-17 MED ORDER — CHLORDIAZEPOXIDE HCL 25 MG PO CAPS: 25.0000 mg | ORAL_CAPSULE | Freq: Three times a day (TID) | ORAL | Status: DC

## 2021-11-17 MED ORDER — PROPOFOL 500 MG/50ML IV EMUL
INTRAVENOUS | Status: DC | PRN
  Administered 2021-11-17: 50 ug/kg/min via INTRAVENOUS

## 2021-11-17 MED ORDER — EPHEDRINE SULFATE (PRESSORS) 50 MG/ML IJ SOLN
INTRAMUSCULAR | Status: DC | PRN
  Administered 2021-11-17: 5 mg via INTRAVENOUS

## 2021-11-17 MED ORDER — MAGNESIUM HYDROXIDE 400 MG/5ML PO SUSP: 30.0000 mL | Freq: Every day | ORAL | Status: DC | PRN

## 2021-11-17 MED ORDER — SODIUM CHLORIDE 0.9% IV SOLUTION
Freq: Once | INTRAVENOUS | Status: AC
  Filled 2021-11-17: qty 250

## 2021-11-17 MED ORDER — ACETAMINOPHEN 325 MG PO TABS
650.0000 mg | ORAL_TABLET | ORAL | Status: DC | PRN
  Administered 2021-11-18 – 2021-11-24 (×2): 650 mg via ORAL
  Filled 2021-11-17: qty 2

## 2021-11-17 MED ORDER — CEFAZOLIN SODIUM-DEXTROSE 2-3 GM-%(50ML) IV SOLR
INTRAVENOUS | Status: DC | PRN
  Administered 2021-11-17: 2 g via INTRAVENOUS

## 2021-11-17 MED ORDER — NITROGLYCERIN IN D5W 200-5 MCG/ML-% IV SOLN
INTRAVENOUS | Status: AC
  Filled 2021-11-17: qty 250

## 2021-11-17 MED ORDER — ORAL CARE MOUTH RINSE
15.0000 mL | OROMUCOSAL | Status: DC
  Administered 2021-11-17 – 2021-11-28 (×105): 15 mL via OROMUCOSAL
  Filled 2021-11-17 (×3): qty 15

## 2021-11-17 MED ORDER — MANNITOL 25 % IV SOLN
INTRAVENOUS | Status: DC | PRN
  Administered 2021-11-17: 50 g via INTRAVENOUS

## 2021-11-17 MED ORDER — THIAMINE HCL 100 MG/ML IJ SOLN
Freq: Once | INTRAVENOUS | Status: AC
  Filled 2021-11-17: qty 1000

## 2021-11-17 MED ORDER — ATORVASTATIN CALCIUM 20 MG PO TABS
40.0000 mg | ORAL_TABLET | Freq: Every day | ORAL | Status: DC
Start: 2021-11-17 — End: 2021-11-28
  Administered 2021-11-22 – 2021-11-28 (×7): 40 mg
  Filled 2021-11-17 (×8): qty 2

## 2021-11-17 MED ORDER — THIAMINE HCL 100 MG/ML IJ SOLN
100.0000 mg | Freq: Every day | INTRAMUSCULAR | Status: DC
  Administered 2021-11-20 – 2021-11-24 (×5): 100 mg via INTRAVENOUS
  Filled 2021-11-17 (×6): qty 2

## 2021-11-17 MED ORDER — LORATADINE 10 MG PO TABS: 10.0000 mg | ORAL_TABLET | Freq: Every day | ORAL | Status: DC

## 2021-11-17 MED ORDER — GELATIN ABSORBABLE 100 CM EX MISC
CUTANEOUS | Status: AC
  Filled 2021-11-17: qty 1

## 2021-11-17 MED ORDER — PHENYLEPHRINE HCL-NACL 20-0.9 MG/250ML-% IV SOLN
INTRAVENOUS | Status: AC
  Filled 2021-11-17: qty 250

## 2021-11-17 MED ORDER — SODIUM CHLORIDE 0.9 % IV BOLUS
500.0000 mL | Freq: Once | INTRAVENOUS | Status: AC
  Administered 2021-11-17: 500 mL via INTRAVENOUS

## 2021-11-17 MED ORDER — SUCCINYLCHOLINE CHLORIDE 20 MG/ML IJ SOLN
INTRAMUSCULAR | Status: DC | PRN
  Administered 2021-11-17: 120 mg via INTRAVENOUS

## 2021-11-17 MED ORDER — FENTANYL CITRATE (PF) 100 MCG/2ML IJ SOLN
INTRAMUSCULAR | Status: DC | PRN
  Administered 2021-11-17: 150 ug via INTRAVENOUS
  Administered 2021-11-17 (×2): 50 ug via INTRAVENOUS

## 2021-11-17 MED ORDER — MULTIVITAMINS PO CAPS: 1.0000 | ORAL_CAPSULE | Freq: Every day | ORAL | Status: DC

## 2021-11-17 MED ORDER — MANNITOL 25 % IV SOLN
50.0000 g | Freq: Once | INTRAVENOUS | Status: AC
  Administered 2021-11-17: 50 g via INTRAVENOUS
  Filled 2021-11-17: qty 200

## 2021-11-17 MED ORDER — FENTANYL CITRATE PF 50 MCG/ML IJ SOSY
25.0000 ug | PREFILLED_SYRINGE | Freq: Once | INTRAMUSCULAR | Status: AC
  Administered 2021-11-17: 25 ug via INTRAVENOUS

## 2021-11-17 MED ORDER — PROPOFOL 1000 MG/100ML IV EMUL
INTRAVENOUS | Status: AC
Start: 2021-11-17 — End: ?
  Filled 2021-11-17: qty 100

## 2021-11-17 MED ORDER — LACTATED RINGERS IV BOLUS
1000.0000 mL | Freq: Once | INTRAVENOUS | Status: AC
  Administered 2021-11-17: 1000 mL via INTRAVENOUS

## 2021-11-17 MED ORDER — MIDAZOLAM-SODIUM CHLORIDE 100-0.9 MG/100ML-% IV SOLN
0.5000 mg/h | INTRAVENOUS | Status: DC
  Administered 2021-11-17: 0.5 mg/h via INTRAVENOUS
  Administered 2021-11-19: 3 mg/h via INTRAVENOUS
  Filled 2021-11-17: qty 100

## 2021-11-17 MED ORDER — TRAZODONE HCL 50 MG PO TABS
25.0000 mg | ORAL_TABLET | Freq: Every evening | ORAL | Status: DC | PRN
  Administered 2021-11-18: 25 mg
  Filled 2021-11-17: qty 1

## 2021-11-17 MED ORDER — ROCURONIUM BROMIDE 10 MG/ML (PF) SYRINGE
PREFILLED_SYRINGE | INTRAVENOUS | Status: AC
  Filled 2021-11-17: qty 10

## 2021-11-17 MED ORDER — PANTOPRAZOLE SODIUM 40 MG PO TBEC
40.0000 mg | DELAYED_RELEASE_TABLET | Freq: Every day | ORAL | Status: DC
Start: 2021-11-17 — End: 2021-11-17

## 2021-11-17 MED ORDER — MIDAZOLAM HCL 2 MG/2ML IJ SOLN
1.0000 mg | INTRAMUSCULAR | Status: AC | PRN
  Administered 2021-11-17 – 2021-11-22 (×3): 1 mg via INTRAVENOUS
  Filled 2021-11-17 (×2): qty 2

## 2021-11-17 MED ORDER — LIDOCAINE-EPINEPHRINE 1 %-1:100000 IJ SOLN
INTRAMUSCULAR | Status: AC
  Filled 2021-11-17: qty 1

## 2021-11-17 MED ORDER — SUCCINYLCHOLINE CHLORIDE 200 MG/10ML IV SOSY
PREFILLED_SYRINGE | INTRAVENOUS | Status: AC
  Filled 2021-11-17: qty 10

## 2021-11-17 MED ORDER — PROPOFOL 1000 MG/100ML IV EMUL
INTRAVENOUS | Status: AC
  Administered 2021-11-17: 20 ug/kg/min via INTRAVENOUS
  Filled 2021-11-17: qty 100

## 2021-11-17 MED ORDER — PANTOPRAZOLE 2 MG/ML SUSPENSION
40.0000 mg | Freq: Every day | ORAL | Status: DC
  Administered 2021-11-19 – 2021-11-28 (×10): 40 mg
  Filled 2021-11-17 (×11): qty 20

## 2021-11-17 MED ORDER — OCUVITE-LUTEIN PO CAPS: 6.0000 | ORAL_CAPSULE | Freq: Every day | ORAL | Status: DC

## 2021-11-17 SURGICAL SUPPLY — 82 items
APPLICATOR CHLORAPREP 10.5 ORG (MISCELLANEOUS) ×4 IMPLANT
BASIN GRAD PLASTIC 32OZ STRL (MISCELLANEOUS) ×3 IMPLANT
BLADE CLIPPER SPEC (BLADE) ×2 IMPLANT
BLADE SURG 15 STRL LF DISP TIS (BLADE) ×1 IMPLANT
BLADE SURG 15 STRL SS (BLADE) ×1
BULB RESERV EVAC DRAIN JP 100C (MISCELLANEOUS) ×1 IMPLANT
BUR ACORN 7.5 PRECISION (BURR) ×2 IMPLANT
BUR SPIRAL ROUTER 2.3 (BUR) ×2 IMPLANT
BUR TAPERED ROUTER 2.3 (BUR) IMPLANT
BURR TAPERED ROUTER 2.3 (BUR) ×2
CNTNR SPEC 2.5X3XGRAD LEK (MISCELLANEOUS) ×3
CONT SPEC 4OZ STER OR WHT (MISCELLANEOUS) ×3
CONTAINER SPEC 2.5X3XGRAD LEK (MISCELLANEOUS) ×3 IMPLANT
COUNTER NEEDLE 20/40 LG (NEEDLE) ×2 IMPLANT
DRAIN CHANNEL JP 10F RND 20C F (MISCELLANEOUS) ×1 IMPLANT
DRAIN JP 10F RND SILICONE (MISCELLANEOUS) IMPLANT
DRAPE INCISE 23X17 IOBAN STRL (DRAPES) ×1
DRAPE INCISE 23X17 STRL (DRAPES) ×2 IMPLANT
DRAPE INCISE IOBAN 23X17 STRL (DRAPES) ×1 IMPLANT
DRAPE INCISE IOBAN 66X45 STRL (DRAPES) ×2 IMPLANT
DRAPE SURG 17X11 SM STRL (DRAPES) ×8 IMPLANT
DRAPE WARM FLUID 44X44 (DRAPES) ×1 IMPLANT
DRSG TEGADERM 4X4.75 (GAUZE/BANDAGES/DRESSINGS) ×2 IMPLANT
ELECT CAUTERY BLADE TIP 2.5 (TIP) ×2
ELECT REM PT RETURN 9FT ADLT (ELECTROSURGICAL) ×2
ELECTRODE CAUTERY BLDE TIP 2.5 (TIP) ×1 IMPLANT
ELECTRODE REM PT RTRN 9FT ADLT (ELECTROSURGICAL) ×1 IMPLANT
GAUZE 4X4 16PLY ~~LOC~~+RFID DBL (SPONGE) ×4 IMPLANT
GAUZE XEROFORM 1X8 LF (GAUZE/BANDAGES/DRESSINGS) ×2 IMPLANT
GLOVE BIOGEL PI IND STRL 6.5 (GLOVE) ×1 IMPLANT
GLOVE BIOGEL PI INDICATOR 6.5 (GLOVE) ×1
GLOVE SURG SYN 6.5 ES PF (GLOVE) ×2 IMPLANT
GLOVE SURG SYN 6.5 PF PI (GLOVE) ×1 IMPLANT
GLOVE SURG SYN 8.5  E (GLOVE) ×4
GLOVE SURG SYN 8.5 E (GLOVE) ×4 IMPLANT
GLOVE SURG SYN 8.5 PF PI (GLOVE) ×4 IMPLANT
GLOVE SURG UNDER POLY LF SZ8.5 (GLOVE) ×2 IMPLANT
GOWN SRG LRG LVL 4 IMPRV REINF (GOWNS) ×1 IMPLANT
GOWN SRG XL LVL 3 NONREINFORCE (GOWNS) ×1 IMPLANT
GOWN STRL NON-REIN TWL XL LVL3 (GOWNS) ×1
GOWN STRL REIN LRG LVL4 (GOWNS) ×1
GRADUATE 1200CC STRL 31836 (MISCELLANEOUS) ×2 IMPLANT
GRAFT DURAGEN MATRIX 1WX1L (Tissue) IMPLANT
GRAFT DURAGEN MATRIX 2WX2L IMPLANT
GRAFT DURAGEN MATRIX 3WX3L (Graft) IMPLANT
GRAFT DURAGEN MATRIX 5WX7L (Graft) ×1 IMPLANT
HEMOSTAT SURGICEL 2X14 (HEMOSTASIS) IMPLANT
HEMOSTAT SURGICEL 2X3 (HEMOSTASIS) ×1 IMPLANT
HEMOSTAT SURGICEL 4X8 (HEMOSTASIS) IMPLANT
HOOK STAY BLUNT/RETRACTOR 5M (MISCELLANEOUS) ×2 IMPLANT
KIT TURNOVER KIT A (KITS) ×2 IMPLANT
MANIFOLD NEPTUNE II (INSTRUMENTS) ×2 IMPLANT
MARKER SKIN DUAL TIP RULER LAB (MISCELLANEOUS) ×4 IMPLANT
MAT ABSORB  FLUID 56X50 GRAY (MISCELLANEOUS) ×1
MAT ABSORB FLUID 56X50 GRAY (MISCELLANEOUS) ×1 IMPLANT
NEEDLE HYPO 22GX1.5 SAFETY (NEEDLE) ×2 IMPLANT
NS IRRIG 1000ML POUR BTL (IV SOLUTION) ×2 IMPLANT
NS IRRIG 500ML POUR BTL (IV SOLUTION) ×1 IMPLANT
PACK CRANIOTOMY CUSTOM (CUSTOM PROCEDURE TRAY) ×2 IMPLANT
PAD ARMBOARD 7.5X6 YLW CONV (MISCELLANEOUS) ×4 IMPLANT
PATTIES SURGICAL .5 X3 (DISPOSABLE) ×1 IMPLANT
PIN MAYFIELD SKULL DISP (PIN) ×1 IMPLANT
PLATE 1.5  2HOLE LNG NEURO (Plate) ×2 IMPLANT
PLATE 1.5 2HOLE LNG NEURO (Plate) IMPLANT
PLATE 1.5/0.5 18.5MM BURR HOLE (Plate) ×2 IMPLANT
SCREW SELF DRILL HT 1.5/4MM (Screw) ×18 IMPLANT
SET CATH VENT DRAIN 3-15 1.9D (DRAIN) IMPLANT
SHEET NEURO XL SOL CTL (MISCELLANEOUS) ×2 IMPLANT
SOL PREP PROV IODINE SCRUB 4OZ (MISCELLANEOUS) ×2 IMPLANT
SOL PREP PVP 2OZ (MISCELLANEOUS) ×2
SOLUTION PREP PVP 2OZ (MISCELLANEOUS) ×1 IMPLANT
STAPLER SKIN PROX 35W (STAPLE) ×6 IMPLANT
SURGIFLO W/THROMBIN 8M KIT (HEMOSTASIS) ×3 IMPLANT
SURGILUBE 2OZ TUBE FLIPTOP (MISCELLANEOUS) ×2 IMPLANT
SUT ETHILON 3-0 FS-10 30 BLK (SUTURE) ×2
SUT NURALON 4 0 TR CR/8 (SUTURE) ×2 IMPLANT
SUT VIC AB 2-0 CT1 18 (SUTURE) ×4 IMPLANT
SUTURE EHLN 3-0 FS-10 30 BLK (SUTURE) IMPLANT
SYR 10ML LL (SYRINGE) ×2 IMPLANT
SYR 20ML LL LF (SYRINGE) ×4 IMPLANT
TAPE CLOTH 3X10 WHT NS LF (GAUZE/BANDAGES/DRESSINGS) ×2 IMPLANT
TOWEL OR 17X26 4PK STRL BLUE (TOWEL DISPOSABLE) ×8 IMPLANT

## 2021-11-17 NOTE — Progress Notes (Signed)
?  Chaplain On-Call responded to Rapid Response notification at 0512 hours. ? ?Met Dr. Cyril Mourning Ward at patient's room while patient was receiving X-Ray examination. ?Learned from Dr. Leonides Schanz that the patient's wife left earlier for home, and will be called to return to the ED. ?Dr. Leonides Schanz stated that the patient has a serious brain bleed, and is awaiting further assessment by the Neurology service. ? ?Chaplain is available for additional support as needed. ? ?Chaplain Pollyann Samples ?M.Div., [facility] ? ? ?

## 2021-11-17 NOTE — Progress Notes (Signed)
Initial Nutrition Assessment ? ?DOCUMENTATION CODES:  ? ?Not applicable ? ?INTERVENTION:  ? ?Vital HP '@65ml'$ /hr- Initiate at 30m/hr and increase by 152mhr q 8 hours until goal rate is reached.  ? ?Free water flushes 3082m4 hours to maintain tube patency  ? ?Regimen provides 1560kcal/day, 136g/day protein and 1484m5my of free water.  ? ?Pt at high refeed risk; recommend monitor potassium, magnesium and phosphorus labs daily until stable ? ?NUTRITION DIAGNOSIS:  ? ?Inadequate oral intake related to inability to eat (pt sedated and ventilated) as evidenced by NPO status. ? ?GOAL:  ? ?Provide needs based on ASPEN/SCCM guidelines ? ?MONITOR:  ? ?Vent status, Labs, Weight trends, TF tolerance, Skin, I & O's ? ?REASON FOR ASSESSMENT:  ? ?Ventilator ?  ? ?ASSESSMENT:  ? ?80 y50 male with h/o etoh abuse, GERD, HTN and DM who is admitted with SDH after fall now s/p left frontotemporoparietal craniotomy for evacuation of hematoma 5/4. ? ?Pt sedated and ventilated. OGT coiled in esophagus and placement has been attempted numerous times. Plan is to initiate tube feeds today if access can be gained. Pt is likely at refeed risk. Per chart, pt is down 11lbs(6%) since January; RD unsure how recently weight loss occurred.  ? ?Medications reviewed and include: D3, colace, folic acid, insulin, MVI, omega 3, zofran, protonix, miralax, thiamine, unasyn, propofol  ? ?Labs reviewed: K 3.3(L) ?Cbgs- 158, 176, 204, 152 x 24 hrs ? ?Patient is currently intubated on ventilator support ?MV: 7.7 L/min ?Temp (24hrs), Avg:99.7 ?F (37.6 ?C), Min:97.3 ?F (36.3 ?C), Max:100.6 ?F (38.1 ?C) ? ?Propofol: 7.5 ml/hr- provides 198kcal/day  ? ?MAP- >65mm16m? ?UOP- 1275ml 67mP drain in place ? ?NUTRITION - FOCUSED PHYSICAL EXAM: ? ?Flowsheet Row Most Recent Value  ?Orbital Region No depletion  ?Upper Arm Region No depletion  ?Thoracic and Lumbar Region No depletion  ?Buccal Region No depletion  ?Temple Region No depletion  ?Clavicle Bone Region Mild  depletion  ?Clavicle and Acromion Bone Region Mild depletion  ?Scapular Bone Region No depletion  ?Dorsal Hand Mild depletion  ?Patellar Region Mild depletion  ?Anterior Thigh Region Mild depletion  ?Posterior Calf Region Mild depletion  ?Edema (RD Assessment) None  ?Hair Reviewed  ?Eyes Reviewed  ?Mouth Reviewed  ?Skin Reviewed  ?Nails Reviewed  ? ?Diet Order:   ?Diet Order   ? ?       ?  Diet NPO time specified  Diet effective now       ?  ? ?  ?  ? ?  ? ?EDUCATION NEEDS:  ? ?No education needs have been identified at this time ? ?Skin:  Skin Assessment: Reviewed RN Assessment (incision head) ? ?Last BM:  pta ? ?Height:  ? ?Ht Readings from Last 1 Encounters:  ?11/29/2021 '5\' 10"'$  (1.778 m)  ? ? ?Weight:  ? ?Wt Readings from Last 1 Encounters:  ?11/20/2021 86.2 kg  ? ? ?Ideal Body Weight:  75.45 kg ? ?BMI:  Body mass index is 27.26 kg/m?. ? ?Estimated Nutritional Needs:  ? ?Kcal:  1589kcal/day ? ?Protein:  120-135g/day ? ?Fluid:  1.9-2.2L/day ? ?Kaniyah Lisby Koleen DistanceD, LDN ?Please refer to [facility] for RD and/or RD on-call/weekend/after hours pager ? ?

## 2021-11-17 NOTE — ED Notes (Signed)
Patient transported to CT 

## 2021-11-17 NOTE — ED Notes (Signed)
Consent form for surgery signed by pts wife. Provider at bedside explaining procedure. ?

## 2021-11-17 NOTE — Code Documentation (Signed)
X-ray at bedside

## 2021-11-17 NOTE — ED Provider Notes (Addendum)
6:00 AM  Pt becoming increasingly agitated and difficult to redirect.  Began vomiting.  Concern for possible aspiration as patient had rhonchorous breath sounds diffusely and oxygen saturation of 83% on room air.  Does not wear oxygen chronically.  Decision was made to intubate patient for airway protection.  Patient was confirmed to be a full code by the hospitalist service but wife is no longer at the bedside.  Intubated patient without difficulty.  Blood glucose in the 200s.  Rectal temp 98.1.  Repeat head CT obtained which showed no significant change in his subarachnoid bleed but now development of a left subdural with midline shift.  Dr. Izora Ribas with neurosurgery made aware and is coming to see the patient now.  I also contacted his wife and requested that she come to the emergency department so that she could have a discussion with neurosurgery about goals of care.  Discussed with hospitalist and critical care provider Briton Lee Chest Rust, NP.  Patient to be admitted to the ICU. ? ?6:35 AM  Pt's wife now at bedside along with NSG for further discussion. ? ? ?6:44 AM  Pt will be going to OR now.  Will order 50g of mannitol. ? ? ?Procedure Name: Intubation ?Date/Time: 11/14/2021 5:25 AM ?Performed by: Samayra Hebel, Delice Bison, DO ?Pre-anesthesia Checklist: Patient identified, Emergency Drugs available, Patient being monitored, Timeout performed and Suction available ?Oxygen Delivery Method: Non-rebreather mask ?Preoxygenation: Pre-oxygenation with 100% oxygen ?Induction Type: Rapid sequence ?Ventilation: Mask ventilation without difficulty ?Laryngoscope Size: 4 and Glidescope ?Grade View: Grade II ?Tube size: 7.5 mm ?Number of attempts: 1 ?Placement Confirmation: Breath sounds checked- equal and bilateral, CO2 detector, ETT inserted through vocal cords under direct vision and Positive ETCO2 ?Secured at: 28 cm ?Tube secured with: ETT holder ? ? ? ?  ?Kayden Hutmacher, Delice Bison, DO ?11/30/2021 0645 ? ?

## 2021-11-17 NOTE — Progress Notes (Addendum)
Critical care note ? ?Date of note: 12/08/2021 ? ?Subjective: The patient became too agitated and had nausea and vomiting with subsequent diminished pulse oximetry and respiratory distress and suspected aspiration with decreased responsiveness. ? ?Objective: Physical examination: ?Generally: Acutely ill mentally presented elderly Caucasian male in moderate respiratory distress. ?Vital signs: BP was 150/87 with a heart rate of 79 and pulse oximetry of 88% on room air and respiratory rate of 18. ?Head - atraumatic, normocephalic.  ?Pupils - equal, round and reactive to light and accommodation. Extraocular movements are intact. No scleral icterus.  ?Oropharynx - moist mucous membranes and tongue. No pharyngeal erythema or exudate.  ?Neck - supple. No JVD. Carotid pulses 2+ bilaterally. No carotid bruits. No palpable thyromegaly or lymphadenopathy. ?Cardiovascular - regular rate and rhythm. Normal S1 and S2. No murmurs, gallops or rubs.  ?Lungs -diminished bibasilar breath sounds. ?Abdomen - soft and nontender. Positive bowel sounds. No palpable organomegaly or masses.  ?Extremities - no pitting edema, clubbing or cyanosis.  ?Neuro - grossly non-focal. ?Skin - no rashes. ?GU and rectal exam - deferred. ? ?Assessment/plan: ?1.  Acute hypoxic respiratory failure due to aspiration from vomiting in the setting of large traumatic subarachnoid hemorrhage. ?- The patient was intubated and placed on mechanical ventilation by Dr. Leonides Schanz.  Portable chest x-ray showed ET tube in place with low lung volumes and probable areas of passive subsegmental atelectasis.  Abdominal x-ray showed enteric tube was not visualized, presumably in the proximal to mid esophagus. ?- Stat head CT scan without contrast was ordered to assess for extension of his SAH. ?- As needed antiemetics were ordered. ?- With stability of his SAH, he will be transferred to an ICU bed.  ICU team is aware about the patient ?- With extension of his [facility], will defer  management to Dr. Izora Ribas. ? ?Authorized and performed by: Eugenie Norrie, MD ?Total critical care time: Approximately  30     minutes. ?Due to a high probability of clinically significant, life-threatening deterioration, the patient required my highest level of preparedness to intervene emergently and I personally spent this critical care time directly and personally managing the patient.  This critical care time included obtaining a history, examining the patient, pulse oximetry, ordering and review of studies, arranging urgent treatment with development of management plan, evaluation of patient's response to treatment, frequent reassessment, and discussions with other providers. ?This critical care time was performed to assess and manage the high probability of imminent, life-threatening deterioration that could result in multiorgan failure.  It was exclusive of separately billable procedures and treating other patients and teaching time. ?  ?Critical care time spent: 30 minutes ? ?  ?

## 2021-11-17 NOTE — ED Notes (Signed)
Pt alert to self only at this time. Pt continues to try to get out of bed and remains agitated. ?

## 2021-11-17 NOTE — Progress Notes (Signed)
SLP Cancellation Note ? ?Patient Details ?Name: Michael Cohen ?MRN: [id number] ?DOB: 1940-07-30 ? ? ?Cancelled treatment:       Reason Eval/Treat Not Completed: Patient not medically ready;Patient at procedure or test/unavailable;Medical issues which prohibited therapy  ? ?SLP consult received and appreciated. Chart review completed. Head CT, 11/14/2021, "1. New subdural hematoma along the majority of the left cerebral convexity measuring up to 1 cm in thickness and primarily responsible for new 7 mm midline shift. 2. Some thickening of subarachnoid hemorrhage although stable pattern. 3. Trace subdural hematoma along the anterior right frontal ?convexity. 4. Early visualization of cerebral contusion in the bilateral inferior frontal and left temporal lobes." Pt currently intubated and in OR for L craniotomy and SDH evacuation.  ? ?Given the above, SLP to hold at this time and will continue efforts as appropriate. ? ?Cherrie Gauze, M.S., CCC-SLP ?Speech-Language Pathologist ?[facility] ?[phone] ([place]) ? ?Quintella Baton ?11/18/2021, 8:05 AM ?

## 2021-11-17 NOTE — Progress Notes (Signed)
?   11/18/2021 1230  ?Clinical Encounter Type  ?Visited With Patient not available  ?Visit Type Initial;Spiritual support;Social support  ? ?Chaplain Burris stopped by to offer support based on report of Michael Cohen. Spouse not present. Offered presence and silent prayer for Michael Cohen. Will f/u this afternoon or page chaplain as needed. ?

## 2021-11-17 NOTE — Progress Notes (Signed)
?  Chaplain On-Call received a page from the ED Secretary asking for the Chaplain to escort the patient's wife from the ED to the Operating Room waiting area. ? ?Chaplain arrived at ED-16 and met the patient's wife. ? ?Chaplain pushed the patient's walker, while Staff Nurses from the [facility] assisted by pushing the patient in a wheelchair. ? ?Chaplain provided spiritual and emotional support for patient's wife. ?Provided assurance of the availability of further support from Chaplains. ? ?Chaplain Charlie Dashauna Heymann ?M.Div., [facility] ?

## 2021-11-17 NOTE — Assessment & Plan Note (Signed)
-   We will continue PPI therapy 

## 2021-11-17 NOTE — Code Documentation (Signed)
Xray called at this time.

## 2021-11-17 NOTE — ED Notes (Signed)
ET tube confirmed through positive color change and breathe sounds.  ?ET and OG placement confirmed by xray by Dr. Leonides Schanz ?

## 2021-11-17 NOTE — ED Notes (Addendum)
Respiratory and Dr. Leonides Schanz at bedside. All equipment at bedside. Preparing for intubation. ?

## 2021-11-17 NOTE — Op Note (Signed)
Indications: The patient is a 81yo male who presented with a subdural hematoma.  Due to ongoing brain compression and symptoms, surgical intervention was recommended.  ? ?Findings: subdural hematoma ? ?Preoperative Diagnosis: subdural hematoma ?Postoperative Diagnosis: same ? ? ?EBL: 250 ml ?IVF: see AR ml ?Drains: 1 placed ?Disposition: Intubated and Critically ill to ICU ?Complications: none ? ?A foley catheter was placed in ER. ? ? ?Preoperative Note:  ? ?Risks of surgery discussed include: infection, bleeding, stroke, coma, death, paralysis, CSF leak, nerve/spinal cord injury, numbness, tingling, weakness, vascular injury, need for further surgery, persistent symptoms, and the risks of anesthesia. The patient's wife understood these risks and agreed to proceed. ? ?NAME OF PROCEDURE:               ?1. Left Frontotemporoparietal Craniotomy for evacuation of hematoma ? ? ?PROCEDURE:  Patient was brought to the operating room, intubated. The mayfield pins were applied.  The patient was then positioned for a left-sided craniotomy.   ? ?The incision was planned, then prepped and draped in standard fashion.  The incision was opened sharply, then the galea opened.  A retractor was placed.  The temporalis muscle was divided, then the periosteal used to reflect the muscle.   A frontotemporoparietal craniotomy was then fashioned with the burr and craniotome. ? ?The dura was identified, then opened sharply.  A subdural hematoma was identified.  The acute subdural was then removed using irrigation and suction.  After removal, the intradural space was inspected and hemostasis achieved.   ? ?After hemostasis was achieved, we turned attention to closure.  The dura was approximated.  Tackup sutures were placed. The craniotomy site was checked and a fixation plate used to reconstruct the skull.  A subgaleal drain was placed.The temporalis and galea were closed.  Staples were used on the skin. A sterile dressing was placed.    ? ?Needle, lap and all counts were correct at the end of the case.   ? ?Cooper Render PA assisted in the procedure.  ? ?Meade Maw MD ?Neurosurgery ? ? ?

## 2021-11-17 NOTE — ED Notes (Signed)
Pt transported to CT with this RN on monitor with respiratory  ?

## 2021-11-17 NOTE — Interval H&P Note (Signed)
History and Physical Interval Note: ? ?11/22/2021 ?7:35 AM ? ?Michael Cohen  has presented today for surgery, with the diagnosis of subdural hematoma.  The various methods of treatment have been discussed with the patient and family. After consideration of risks, benefits and other options for treatment, the patient has consented to  Procedure(s): ?CRANIOTOMY HEMATOMA EVACUATION SUBDURAL (Left) as a surgical intervention.  The patient's history has been reviewed, patient examined, no change in status, stable for surgery.  I have reviewed the patient's chart and labs.  Questions were answered to the patient's satisfaction.   ? ? ?Susannah Carbin ? ? ?

## 2021-11-17 NOTE — Assessment & Plan Note (Signed)
-   The patient will be placed on supplement coverage with NovoLog. ?- We will hold off his metformin and continue his Januvia. ?

## 2021-11-17 NOTE — ED Notes (Signed)
Pts wife leaves bedside. Bed alarm remains on and call light is within reach.  ?

## 2021-11-17 NOTE — Assessment & Plan Note (Signed)
-   We will continue his antihypertensives. 

## 2021-11-17 NOTE — Consult Note (Signed)
? ?NAME:  Michael Cohen, MRN:  [id number], DOB:  April 10, 1941, LOS: 0 ?ADMISSION DATE:  12/04/2021, CONSULTATION DATE:  12/06/2021 ?REFERRING MD:  Dr. Sidney Ace, CHIEF COMPLAINT: Fall   ? ?History of Present Illness:  ?81 year old male presenting to [facility] ED from [facility] on 11/20/2021 via EMS.  Per the patient's wife, he had gone out to walk the dog that evening and had fallen in the entryway hitting his head on the corner of the baseboard.  She reported he was altered post fall with complaints of a headache. Patient is not on systemic blood thinners but does take a baby aspirin every other day for heart health.  Wife confirms that the patient drinks 2 scotches daily and sometimes wine with dinner, unclear the exact amount of scotch.  She denies any other recent complaints.  No reports of witnessed seizure-like activity and the patient did confirm that he had drinking alcohol that evening. ?ED course: ?Head CT showed large subarachnoid hemorrhage.  Follow-up CTA revealed no aneurysm.  EDP spoke with Dr. Cari Caraway with neurosurgery who initially recommended admitting to hospitalist service with repeat CT in 6 hours.  TRH admitted patient to stepdown unit.  However, patient still boarding in ED when he became agitated attempting to get out of bed and then vomited.  At this point EDP described patient as having gurgling respirations with SPO2 83% on room air.  Patient was emergently intubated requiring mechanical ventilatory support.  Stat repeat head CT revealed new subdural bleed with 7 mm midline shift.  Dr. Cari Caraway from neurosurgery made aware of change, discussed options bedside with wife and the decision was made to take the patient urgently to the OR and transferred directly to ICU. ?medications given: Keppra 1000 mg, mannitol 50 g, IV contrast, fentanyl, 1 L NS, Tdap, banana bag, etomidate, succinylcholine, propofol drip started ?Initial Vitals: 97.3 F, 17, 62, 117/67 and SPO2 95% on room air ?Significant labs:  (Labs/ Imaging personally reviewed) ?I, Domingo Pulse Rust-Chester, AGACNP-BC, personally viewed and interpreted this ECG. ?EKG Interpretation: Date: 11/14/2021, EKG Time: 23: 55, Rate: 63, Rhythm: First-degree heart block, QRS Axis: Normal,  ?Intervals: First-degree heart block, borderline prolonged QTc, ST/T Wave abnormalities: None, Narrative Interpretation: NSR with first-degree heart block ?Chemistry: Na+:139, K+: 3.3, BUN/Cr.: 8/0.67, Serum CO2/ AG: 24/13 ?Hematology: WBC: 8.2, Hgb: 12.5,  ?Troponin: 6 >5, COVID-19 & Influenza A/B: pending ?ABG: pending ?CXR 12/06/2021: Low lung volumes with probable areas of passive subsegmental atelectasis, as above ?CT head Wo contrast 12/07/2021: Diffuse subarachnoid hemorrhage as described above concentrated predominately in the left sylvian fissure. 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 CTA of the head is ?recommended for further evaluation. Chronic atrophic and ischemic changes are noted ?CT cervical spine 12/07/2021: Degenerative change without acute abnormality ?CT angio head 12/09/2021: No aneurysm, occlusion or high-grade stenosis of the intracranial arteries ?CT head wo contrast 11/25/2021: New subdural hematoma along the majority of the left cerebral convexity measuring up to 1 cm in thickness and primarily responsible for new 7 mm midline shift. Some thickening of subarachnoid hemorrhage although stable pattern. Trace subdural hematoma along the anterior right frontal convexity. Early visualization of cerebral contusion in the bilateral inferior frontal and left temporal lobes. ? ?PCCM consulted due to deterioration requiring emergent intubation and mechanical ventilatory support. ? 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 ?Significant Hospital Events: ?Including procedures, antibiotic start and stop dates in addition to other pertinent events   ?  12/09/2021: Admit to ICU with  worsening SAH after emergent intubation on mechanical ventilatory support with plans to go urgently to OR for neurosurgery ? ?Interim History / Subjective:  ?Patient intubated and sedated unable to participate in interview.  Appears to be purposefully moving extremities, reflexes intact. ?Discussed plan of care with spouse at bedside, all questions and concerns answered at this time ? ?Objective   ?Blood pressure 137/71, pulse 75, temperature (!) 97.3 ?F (36.3 ?C), temperature source Oral, resp. rate 14, height '5\' 10"'$  (1.778 m), weight 86.2 kg, SpO2 95 %. ?   ?   ? ?Intake/Output Summary (Last 24 hours) at 12/05/2021 0528 ?Last data filed at 12/03/2021 0128 ?Gross per 24 hour  ?Intake 1093.28 ml  ?Output --  ?Net 1093.28 ml  ? ?Filed Weights  ? 11/26/2021 0143  ?Weight: 86.2 kg  ? ? ?Examination: ?General: Adult male, critically ill, lying in bed intubated & sedated requiring mechanical ventilation, NAD ?HEENT: MM pink/moist, anicteric, atraumatic, neck supple ?Neuro: intubated & sedated commands, PERRL +3, MAE ?CV: s1s2 RRR, NSR on monitor, no r/m/g ?Pulm: Regular, non labored on PRVC 60% PEEP 5, breath sounds clear-BUL & crackles-BLL ?GI: soft, rounded, bs x 4 ?GU: external catheter in place with clear yellow urine ?Skin: limited exam- hematoma on posterior head ?Extremities: warm/dry, pulses + 2 R/P, no edema noted ? ?Resolved Hospital Problem list   ? ? ?Assessment & Plan:  ?Acute Hypoxic / Hypercapnic Respiratory Failure secondary to aspiration in the setting of Traumatic SAH ?Risk for Aspiration Pneumonia ?- Ventilator settings: PRVC  8 mL/kg, 60% FiO2, 5 PEEP, continue ventilator support & lung protective strategies ?- Wean PEEP & FiO2 as tolerated, maintain SpO2 > 90% ?- Head of bed elevated 30 degrees, VAP protocol in place ?- Plateau pressures less than 30 cm H20  ?- Intermittent chest x-ray & ABG PRN ?- Daily WUA with SBT as tolerated  ?- Ensure adequate pulmonary hygiene  ?- F/u cultures, trend PCT ?- Continue  Aspiration Pna coverage: unasyn ?- Bronchodilators PRN ?- PAD protocol in place: continue Fentanyl drip & Propofol drip ? ?Traumatic SAH secondary to mechanical fall PTA ?Repeat CTH shows newly developed with new 75m midline shift ?Dr. YCari Carawaybedside discussing options with spouse, overall poor prognosis. Decision made to take patient to OR for urgent intervention. ?Post-op orders per Neurosurgery recommendations ?- Neurosurgery following, appreciate input ?- 1 unit of platelets ordered with consent of spouse, due to worsening bleed in the setting of patient taking a baby aspirin every other day at home ?- frequent neuro checks, NIHSS Q shift ?- falls & seizure precautions ? ?Hypokalemia ?-Potassium supplementation ordered ?-Daily BMP replace electrolytes as needed ? ?ETOH use ?- CIWA protocol, with librium taper ?- thiamine, folic acid, multi-vitamin ?- TOC consult ? ?Type 2 Diabetes Mellitus ?Hemoglobin A1C: pending ?- Monitor CBG Q 4 hours ?- SSI moderate dosing ?- target range while in ICU: 140-180 ?- follow ICU hyper/hypo-glycemia protocol ? ?Hypercholesteremia ?- continue Lipitor & Omega 3 ? ?Best Practice (right click and "Reselect all SmartList Selections" daily)  ?Diet/type: NPO w/ meds via tube ?DVT prophylaxis: SCD ?GI prophylaxis: PPI ?Lines: N/A ?Foley:  N/A ?Code Status:  full code ?Last date of multidisciplinary goals of care discussion [11/15/2021] ? ?Labs   ?CBC: ?Recent Labs  ?Lab 12/07/2021 ?0003  ?WBC 8.2  ?HGB 12.5*  ?HCT 40.5  ?MCV 82.5  ?PLT 219  ? ? ?Basic Metabolic Panel: ?Recent Labs  ?Lab 11/20/2021 ?0003  ?NA 139  ?K 3.3*  ?  CL 102  ?CO2 24  ?GLUCOSE 140*  ?BUN 8  ?CREATININE 0.67  ?CALCIUM 9.0  ? ?GFR: ?Estimated Creatinine Clearance: 77.3 mL/min (by C-G formula based on SCr of 0.67 mg/dL). ?Recent Labs  ?Lab 11/15/2021 ?0003  ?WBC 8.2  ? ? ?Liver Function Tests: ?Recent Labs  ?Lab 12/07/2021 ?0003  ?AST 23  ?ALT 16  ?ALKPHOS 58  ?BILITOT 0.7  ?PROT 6.4*  ?ALBUMIN 3.7  ? ?No results for input(s):  LIPASE, AMYLASE in the last 168 hours. ?No results for input(s): AMMONIA in the last 168 hours. ? ?ABG ?No results found for: PHART, PCO2ART, PO2ART, HCO3, TCO2, ACIDBASEDEF, O2SAT  ? ?Coagulation Profile: ?Re

## 2021-11-17 NOTE — Anesthesia Procedure Notes (Signed)
Arterial Line Insertion ?Start/End05/11/2021 7:55 AM, 12/12/2021 8:00 AM ?Performed by: Iran Ouch, MD, Jerrye Noble, CRNA, CRNA ? Patient location: OR. ?Preanesthetic checklist: patient identified, IV checked, site marked, risks and benefits discussed, surgical consent, monitors and equipment checked and pre-op evaluation ?Patient sedated ?radial was placed ?Catheter size: 20 G ?Hand hygiene performed , maximum sterile barriers used  and Seldinger technique used ? ?Attempts: 1 ?Procedure performed without using ultrasound guided technique. ?Following insertion, dressing applied and Biopatch. ?Post procedure assessment: normal ? ?Patient tolerated the procedure well with no immediate complications. ? ? ?

## 2021-11-17 NOTE — H&P (Signed)
?  ?  ?[place] ? ? ?PATIENT NAME: Michael Cohen   ? ?MR#:  [id number] ? ?DATE OF BIRTH:  02/26/1941 ? ?DATE OF ADMISSION:  11/30/2021 ? ?PRIMARY CARE PHYSICIAN: Kirk Ruths, MD  ? ?Patient is coming from: Home ? ?REQUESTING/REFERRING PHYSICIAN: Ward, Delice Bison, DO ?CHIEF COMPLAINT:  ? ?Chief Complaint  ?Patient presents with  ? Altered Mental Status  ? Fall  ? ? ?HISTORY OF PRESENT ILLNESS:  ?Michael Cohen is a 81 y.o. male with medical history significant for hypertension and dyslipidemia, who presented to the emergency room with a [place] of altered mental status after having a witnessed fall at home.  The patient was complaining of headache with this confusion.  Per his wife he did not have any nausea or vomiting or abdominal pain.  He denied any chest pain or dyspnea or palpitations.  No paresthesias or focal muscle weakness.  He was initially disoriented to time but during my interview is alert and oriented x3.  No urinary or stool incontinence.  No tinnitus or vertigo.  No cough or wheezing or hemoptysis.  No other bleeding diathesis.  He is on aspirin but no other anticoagulants. ? ?ED Course: When he came to the ER, vital signs were within normal.  Labs revealed mild hypokalemia of 3.3 with otherwise normal CMP.  Total protein was 6.4.  High-sensitivity troponin I was 6 and later 5.  CBC showed hemoglobin of 12.5.  INR was 1 and PT 13.4.  Alcohol level was 188. ?EKG as reviewed by me : EKG showed normal sinus rhythm with a rate of 63 with prolonged PR interval and incomplete right bundle branch block. ?Imaging: Noncontrasted CT scan revealed the following: ?CT of the head: Diffuse subarachnoid hemorrhage as described above ?concentrated predominately in the left sylvian fissure. 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 CTA of the head is ?recommended for further evaluation. ?  ?Chronic atrophic and ischemic changes are noted. ?   ?CT of the cervical spine: Degenerative change without acute ?abnormality. ? ?Head CTA revealed no aneurysm, occlusion or high-grade stenosis of the intracranial arteries. ? ?The patient was given 1 L bolus of IV normal saline and Tdap booster.  He will be admitted to stepdown unit bed for further evaluation and management. ?PAST MEDICAL HISTORY:  ? ?Past Medical History:  ?Diagnosis Date  ? Diabetes mellitus without complication ([place])   ? Hypercholesteremia   ? Hypertension   ? ? ?PAST SURGICAL HISTORY:  ? ?Past Surgical History:  ?Procedure Laterality Date  ? CATARACT EXTRACTION Bilateral   ? COLONOSCOPY WITH PROPOFOL N/A 12/12/2017  ? Procedure: COLONOSCOPY WITH PROPOFOL;  Surgeon: [name], [name], MD;  Location: [facility] ENDOSCOPY;  Service: Gastroenterology;  Laterality: N/A;  ? EYE SURGERY    ? ? ?SOCIAL HISTORY:  ? ?Social History  ? ?Tobacco Use  ? Smoking status: Former  ?  Types: Cigarettes  ?  Quit date: 41  ?  Years since quitting: 42.3  ? Smokeless tobacco: Never  ?Substance Use Topics  ? Alcohol use: Yes  ?  Alcohol/week: 14.0 standard drinks  ?  Types: 14 Shots of liquor per week  ? ? ?FAMILY HISTORY:  ?No family history on file. ? ?DRUG ALLERGIES:  ?No Known Allergies ? ?REVIEW OF SYSTEMS:  ? ?ROS ?As per history of present illness. All pertinent systems were reviewed above. Constitutional, HEENT, cardiovascular, respiratory, GI, GU, musculoskeletal, neuro, psychiatric, endocrine, integumentary and  hematologic systems were reviewed and are otherwise negative/unremarkable except for positive findings mentioned above in the HPI. ? ? ?MEDICATIONS AT HOME:  ? ?Prior to Admission medications   ?Medication Sig Start Date End Date Taking? Authorizing Provider  ?acetaminophen (TYLENOL) 325 MG tablet Take 650 mg by mouth every 6 (six) hours as needed.    [provider]  ?aspirin EC 81 MG tablet Take 81 mg by mouth daily.    [provider]  ?atorvastatin (LIPITOR) 40 MG tablet Take 40 mg by  mouth daily.    [provider]  ?cetirizine (ZYRTEC) 10 MG tablet Take 10 mg by mouth daily.    [provider]  ?cholecalciferol (VITAMIN D) 400 units TABS tablet Take 2,000 Units by mouth daily.    [provider]  ?lisinopril-hydrochlorothiazide (PRINZIDE,ZESTORETIC) 10-12.5 MG tablet Take 1 tablet by mouth daily.    [provider]  ?metFORMIN (GLUCOPHAGE-XR) 500 MG 24 hr tablet Take 500 mg by mouth daily with breakfast.    [provider]  ?Multiple Vitamin (MULTIVITAMIN) capsule Take 1 capsule by mouth daily.    [provider]  ?multivitamin-lutein (OCUVITE-LUTEIN) CAPS capsule Take 6 capsules by mouth daily.    [provider]  ?Omega-3 Fatty Acids (FISH OIL) 1000 MG CAPS Take 1,000 mg by mouth 1 day or 1 dose.    [provider]  ?omeprazole (PRILOSEC) 20 MG capsule Take 20 mg by mouth daily.    [provider]  ?sitaGLIPtin (JANUVIA) 100 MG tablet Take 50 mg by mouth daily.    [provider]  ?UNABLE TO FIND Take by mouth once.    [provider]  ? ?  ? ?VITAL SIGNS:  ?Blood pressure 137/71, pulse 75, temperature (!) 97.3 ?F (36.3 ?C), temperature source Oral, resp. rate 14, height '5\' 10"'$  (1.778 m), weight 86.2 kg, SpO2 95 %. ? ?PHYSICAL EXAMINATION:  ?Physical Exam ? ?GENERAL:  81 y.o.-year-old Caucasian male patient lying in the bed with no acute distress.  He was asleep and easily arousable.  He was alert and oriented X3. ?EYES: Pupils equal, round, reactive to light and accommodation. No scleral icterus. Extraocular muscles intact.  ?HEENT: Head atraumatic, normocephalic. Oropharynx and nasopharynx clear.  ?NECK:  Supple, no jugular venous distention. No thyroid enlargement, no tenderness.  ?LUNGS: Normal breath sounds bilaterally, no wheezing, rales,rhonchi or crepitation. No use of accessory muscles of respiration.  ?CARDIOVASCULAR: Regular rate and rhythm, S1, S2 normal. No murmurs, rubs, or  gallops.  ?ABDOMEN: Soft, nondistended, nontender. Bowel sounds present. No organomegaly or mass.  ?EXTREMITIES: No pedal edema, cyanosis, or clubbing.  ?NEUROLOGIC: Cranial nerves II through XII are intact. Muscle strength 5/5 in all extremities. Sensation intact. Gait not checked.  ?PSYCHIATRIC: The patient is alert and oriented x 3.  Normal affect and good eye contact. ?SKIN: No obvious rash, lesion, or ulcer.  ? ?LABORATORY PANEL:  ? ?CBC ?Recent Labs  ?Lab 11/20/2021 ?0003  ?WBC 8.2  ?HGB 12.5*  ?HCT 40.5  ?PLT 219  ? ?------------------------------------------------------------------------------------------------------------------ ? ?Chemistries  ?Recent Labs  ?Lab 11/15/2021 ?0003  ?NA 139  ?K 3.3*  ?CL 102  ?CO2 24  ?GLUCOSE 140*  ?BUN 8  ?CREATININE 0.67  ?CALCIUM 9.0  ?AST 23  ?ALT 16  ?ALKPHOS 58  ?BILITOT 0.7  ? ?------------------------------------------------------------------------------------------------------------------ ? ?Cardiac Enzymes ?No results for input(s): TROPONINI in the last 168 hours. ?------------------------------------------------------------------------------------------------------------------ ? ?RADIOLOGY:  ?CT Angio Head W or Wo Contrast ? ?Result Date: 12/07/2021 ?CLINICAL DATA:  Subarachnoid  hemorrhage EXAM: CT ANGIOGRAPHY HEAD TECHNIQUE: Multidetector CT imaging of the head was performed using the standard protocol during bolus administration of intravenous contrast. Multiplanar CT image reconstructions and MIPs were obtained to evaluate the vascular anatomy. RADIATION DOSE REDUCTION: This exam was performed according to the departmental dose-optimization program which includes automated exposure control, adjustment of the mA and/or kV according to patient size and/or use of iterative reconstruction technique. CONTRAST:  19m OMNIPAQUE IOHEXOL 350 MG/ML SOLN COMPARISON:  None Available. FINDINGS: POSTERIOR CIRCULATION: --Vertebral arteries: Normal --Inferior cerebellar arteries:  Normal. --Basilar artery: Normal. --Superior cerebellar arteries: Normal. --Posterior cerebral arteries: Normal. ANTERIOR CIRCULATION: --Intracranial internal carotid arteries: Normal. --Anterior cerebral art

## 2021-11-17 NOTE — Assessment & Plan Note (Signed)
-   This is clearly the culprit for his traumatic SAH. ?- His fall is clearly secondary to alcohol intoxication. 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 ?- We will place him on a banana bag daily and as needed IV Ativan. ?

## 2021-11-17 NOTE — ED Notes (Signed)
OR team at bedside transporting pt down to OR ?

## 2021-11-17 NOTE — ED Notes (Signed)
Dr. Mitchell Heir at bedside  ?

## 2021-11-17 NOTE — ED Notes (Signed)
Pt hooked up to male purewick  ?

## 2021-11-17 NOTE — ED Notes (Signed)
Bed alarm turned on due to confusion. Wife continues to remain at bedside. Call light within reach.  ?

## 2021-11-17 NOTE — ED Notes (Signed)
ED Provider at bedside. 

## 2021-11-17 NOTE — H&P (View-Only) (Signed)
? ?Referring Physician:  ?No referring provider defined for this encounter. ? ?Primary Physician:  ?Kirk Ruths, MD ? ?Chief Complaint:  subdural hematoma ? ?History of Present Illness: ?12/14/2021 ?Michael Cohen is a 81 y.o. male who presents with the chief complaint of fall. He had a witnessed fall after which he had a headache and confusion prompting presentation to the ER.   ? ?He had an initial CT showing SAH but no substantial compression.  CTA negative for aneurysm.  Around 5 am, he became more and more agitated and then had an aspiration event.  He was intubated for worsening oxygenation, then taken for a new CT scan.  This scan showed development of a new left subdural hematoma with new midline shift and brain compression. ? ?The patient is unable to give history due to his altered mental status. ? ?Review of Systems:  ?A 10 point review of systems is negative, except for the pertinent positives and negatives detailed in the HPI. ? ?Past Medical History: ?Past Medical History:  ?Diagnosis Date  ? Diabetes mellitus without complication ([place])   ? Hypercholesteremia   ? Hypertension   ? ? ?Past Surgical History: ?Past Surgical History:  ?Procedure Laterality Date  ? CATARACT EXTRACTION Bilateral   ? COLONOSCOPY WITH PROPOFOL N/A 12/12/2017  ? Procedure: COLONOSCOPY WITH PROPOFOL;  Surgeon: [name], [name], MD;  Location: [facility] ENDOSCOPY;  Service: Gastroenterology;  Laterality: N/A;  ? EYE SURGERY    ? ? ?Allergies: ?Allergies as of 12/08/2021  ? (No Known Allergies)  ? ? ?Medications: ? ?Current Facility-Administered Medications:  ?   stroke: early stages of recovery book, , Does not apply, Once, Mansy, Jan A, MD ?  0.9 %  sodium chloride infusion, , Intravenous, Continuous, Mansy, Jan A, MD ?  acetaminophen (TYLENOL) tablet 650 mg, 650 mg, Oral, Q4H PRN **OR** acetaminophen (TYLENOL) 160 MG/5ML solution 650 mg, 650 mg, Per Tube, Q4H PRN **OR** acetaminophen (TYLENOL) suppository 650 mg, 650 mg,  Rectal, Q4H PRN, Mansy, Jan A, MD ?  Ampicillin-Sulbactam (UNASYN) 3 g in sodium chloride 0.9 % 100 mL IVPB, 3 g, Intravenous, Q6H, Belue, Alver Sorrow, RPH ?  atorvastatin (LIPITOR) tablet 40 mg, 40 mg, Per Tube, Daily, Rust-Ellakate Gonsalves, Toribio Harbour L, NP ?  chlorhexidine gluconate (MEDLINE KIT) (PERIDEX) 0.12 % solution 15 mL, 15 mL, Mouth Rinse, BID, Rust-Jeliyah Middlebrooks, Toribio Harbour L, NP ?  cholecalciferol (VITAMIN D3) tablet 2,000 Units, 2,000 Units, Oral, Daily, Mansy, Jan A, MD ?  docusate (COLACE) 50 MG/5ML liquid 100 mg, 100 mg, Per Tube, BID, Rust-Erik Nessel, Toribio Harbour L, NP ?  etomidate (AMIDATE) 2 MG/ML injection, , , ,  ?  etomidate (AMIDATE) injection, , Intravenous, PRN, Ward, Kristen N, DO, 30 mg at 11/27/2021 0522 ?  fentaNYL (SUBLIMAZE) bolus via infusion 25-100 mcg, 25-100 mcg, Intravenous, Q15 min PRN, Rust-Roslind Michaux, Toribio Harbour L, NP ?  fentaNYL (SUBLIMAZE) injection 25 mcg, 25 mcg, Intravenous, Once, Rust-Rosanne Wohlfarth, Toribio Harbour L, NP ?  fentaNYL 2526mg in NS 2512m(1011mml) infusion-PREMIX, 25-200 mcg/hr, Intravenous, Continuous, Rust-Johaan Ryser, Britton L, NP ?  insulin aspart (novoLOG) injection 0-15 Units, 0-15 Units, Subcutaneous, Q4H, Rust-Michelina Mexicano, Britton L, NP ?  loratadine (CLARITIN) tablet 10 mg, 10 mg, Per Tube, Daily, Rust-Savahanna Almendariz, Britton L, NP ?  mannitol 25 % injection 50 g, 50 g, Intravenous, Once, Ward, KriDelice BisonO ?  MEDLINE mouth rinse, 15 mL, Mouth Rinse, 10 times per day, Rust-Shaye Lagace, Britton L, NP ?  midazolam (VERSED) injection 1 mg, 1 mg, Intravenous, Q15 min PRN, Rust-Takya Vandivier, BriHuel Cote  NP ?  midazolam (VERSED) injection 1 mg, 1 mg, Intravenous, Q2H PRN, Rust-Kainan Patty, Toribio Harbour L, NP ?  ondansetron (ZOFRAN) injection 4 mg, 4 mg, Intravenous, Q4H PRN, Mansy, Jan A, MD ?  ondansetron [facility]) injection 4 mg, 4 mg, Intravenous, Once, Ward, Kristen N, DO ?  pantoprazole sodium (PROTONIX) 40 mg/20 mL oral suspension 40 mg, 40 mg, Per Tube, Daily, Rust-Jennfer Gassen, Toribio Harbour L, NP ?  polyethylene glycol (MIRALAX /  GLYCOLAX) packet 17 g, 17 g, Per Tube, Daily, Rust-Adiya Selmer, Toribio Harbour L, NP ?  potassium chloride (KLOR-CON) packet 40 mEq, 40 mEq, Per Tube, Once, Rust-Duvan Mousel, Toribio Harbour L, NP ?  potassium chloride 10 mEq in 100 mL IVPB, 10 mEq, Intravenous, Q1 Hr x 4, Rust-Trust Crago, Britton L, NP ?  propofol (DIPRIVAN) 1000 MG/100ML infusion, 5-50 mcg/kg/min, Intravenous, Continuous, Rust-Remas Sobel, Britton L, NP ?  succinylcholine (ANECTINE) 200 MG/10ML syringe, , , ,  ?  succinylcholine (ANECTINE) injection, , Intravenous, PRN, Ward, Kristen N, DO, 120 mg at 12/07/2021 0523 ?  traZODone (DESYREL) tablet 25 mg, 25 mg, Per Tube, QHS PRN, Rust-Lamark Schue, Huel Cote, NP ? ?Current Outpatient Medications:  ?  acetaminophen (TYLENOL) 325 MG tablet, Take 650 mg by mouth every 6 (six) hours as needed., Disp: , Rfl:  ?  aspirin EC 81 MG tablet, Take 81 mg by mouth daily., Disp: , Rfl:  ?  atorvastatin (LIPITOR) 40 MG tablet, Take 40 mg by mouth daily., Disp: , Rfl:  ?  cetirizine (ZYRTEC) 10 MG tablet, Take 10 mg by mouth daily., Disp: , Rfl:  ?  cholecalciferol (VITAMIN D) 400 units TABS tablet, Take 2,000 Units by mouth daily., Disp: , Rfl:  ?  lisinopril-hydrochlorothiazide (PRINZIDE,ZESTORETIC) 10-12.5 MG tablet, Take 1 tablet by mouth daily., Disp: , Rfl:  ?  metFORMIN (GLUCOPHAGE-XR) 500 MG 24 hr tablet, Take 500 mg by mouth daily with breakfast., Disp: , Rfl:  ?  Multiple Vitamin (MULTIVITAMIN) capsule, Take 1 capsule by mouth daily., Disp: , Rfl:  ?  multivitamin-lutein (OCUVITE-LUTEIN) CAPS capsule, Take 6 capsules by mouth daily., Disp: , Rfl:  ?  Omega-3 Fatty Acids (FISH OIL) 1000 MG CAPS, Take 1,000 mg by mouth 1 day or 1 dose., Disp: , Rfl:  ?  omeprazole (PRILOSEC) 20 MG capsule, Take 20 mg by mouth daily., Disp: , Rfl:  ?  sitaGLIPtin (JANUVIA) 100 MG tablet, Take 50 mg by mouth daily., Disp: , Rfl:  ?  UNABLE TO FIND, Take by mouth once., Disp: , Rfl:  ? ? ?Social History: ?Social History  ? ?Tobacco Use  ? Smoking status: Former   ?  Types: Cigarettes  ?  Quit date: 56  ?  Years since quitting: 42.3  ? Smokeless tobacco: Never  ?Vaping Use  ? Vaping Use: Never used  ?Substance Use Topics  ? Alcohol use: Yes  ?  Alcohol/week: 14.0 standard drinks  ?  Types: 14 Shots of liquor per week  ? Drug use: Never  ? ? ?Family Medical History: ?No family history on file. ? ?Physical Examination: ?Vitals:  ? 12/05/2021 0145 11/25/2021 0445  ?BP: 112/60 137/71  ?Pulse: 77 75  ?Resp: 14   ?Temp:    ?SpO2: 98% 95%  ? ?Intubated - has evidence of aspiration PNA.  Heart RRR ? ?General: Patient is well developed, well nourished. ? ?Psychiatric: Patient is non-anxious. ? ?Head:  Pupils equal, round, and reactive to light. ? ?ENT:  Oral mucosa appears well hydrated. ? ?Neck:   Supple.   ? ?Respiratory: Patient is intubated ? ?  Extremities: No edema. ? ?Vascular: Palpable pulses in dorsal pedal vessels. ? ?Skin:   On exposed skin, there are no abnormal skin lesions. ? ?NEUROLOGICAL:  ?General: intubated, sedated ?He opens eyes spontaneous.  He does not follow commands.  He does not answer questions.  He is moving all 4 limbs spontaneously. He nearly localizes with his R arm. ? ?Grimace is symmetric.  He does not protrude tongue.  He does not cooperate with drift. ? ?He is at least antigravity in all 4 limbs.  Moves R more briskly than L. ? ?Sensory examination unreliable.  Reflex and Gait examination deferred. ? ?Imaging: ?CT Head 11/25/2021 ?IMPRESSION: ?1. New subdural hematoma along the majority of the left cerebral ?convexity measuring up to 1 cm in thickness and primarily ?responsible for new 7 mm midline shift. ?2. Some thickening of subarachnoid hemorrhage although stable ?pattern. ?3. Trace subdural hematoma along the anterior right frontal ?convexity. ?4. Early visualization of cerebral contusion in the bilateral ?inferior frontal and left temporal lobes. ?  ?  ?Electronically Signed ?  By: Jorje Guild M.D. ?  On: 12/08/2021 06:13 ? ?I have personally  reviewed the images and agree with the above interpretation. ? ?Labs: ? ?  Latest Ref Rng & Units 12/02/2021  ? 12:03 AM  ?CBC  ?WBC 4.0 - 10.5 K/uL 8.2    ?Hemoglobin 13.0 - 17.0 g/dL 12.5    ?Hematocrit 39.0 - 52.0

## 2021-11-17 NOTE — Transfer of Care (Signed)
Immediate Anesthesia Transfer of Care Note ? ?Patient: Michael Cohen ? ?Procedure(s) Performed: CRANIOTOMY HEMATOMA EVACUATION SUBDURAL (Left: Head) ? ?Patient Location: ICU ? ?Anesthesia Type:General ? ?Level of Consciousness: Patient remains intubated per anesthesia plan ? ?Airway & Oxygen Therapy: Patient remains intubated per anesthesia plan ? ?Post-op Assessment: Report given to RN and Post -op Vital signs reviewed and stable ? ?Post vital signs: Reviewed and stable ? ?Last Vitals:  ?Vitals Value Taken Time  ?BP 120/63 11/29/2021 1008  ?Temp    ?Pulse 92 11/30/2021 1017  ?Resp 16 12/11/2021 1017  ?SpO2 100 % 11/14/2021 1017  ?Vitals shown include unvalidated device data. ? ?Last Pain:  ?Vitals:  ? 12/08/2021 2359  ?TempSrc: Oral  ?   ? ?  ? ?Complications: No notable events documented. ?

## 2021-11-17 NOTE — Code Documentation (Signed)
Pt appears to have thrown up and appears to have aspirated. Dr Leonides Schanz at beside to evaluate pt. Rapid response called. Pt preapring to be intubated at this time.  ?

## 2021-11-17 NOTE — Significant Event (Signed)
Rapid Response Event Note  ? ?Reason for Call :  ?AMS, Aspiration ? ?Initial Focused Assessment:  ?Patient not following commands swinging arms attempted to get out of the bed. Patient with increase work of breathing per staff patient had vomited and there was concerns with aspiration as well as a change in mental status. Dr Leonides Schanz was at bedside when rapid nurse arrived to room. Preparing to intubate patient. Patient intubated for airway protection.  ? ? ? ? ?Interventions:  ?Patient intubated  ?OG placed ?CXR gotten for placement ? ?Plan of Care:  ?CT  ?Maintain airway ? ? ?Event Summary:  ? ?MD Notified: Neomia Glass NP ?Call Time: 0513 ?[place] ?End Time:0530 ? ?Gonzella Lex, RN ?

## 2021-11-17 NOTE — ED Notes (Signed)
Pt very aggitated in CT. No orders in at this time for bolus. This RN called Dr. Leonides Schanz, per verbal order, 30 mg bolus of propofol can be given  ?

## 2021-11-17 NOTE — ED Notes (Signed)
Pt returned from CT with this RN and respiratory ?

## 2021-11-17 NOTE — Progress Notes (Signed)
Pt transported to CT and back to CCU without any issues.  ?

## 2021-11-17 NOTE — Consult Note (Addendum)
? ?Referring Physician:  ?No referring provider defined for this encounter. ? ?Primary Physician:  ?Kirk Ruths, MD ? ?Chief Complaint:  subdural hematoma ? ?History of Present Illness: ?12/11/2021 ?Michael Cohen is a 81 y.o. male who presents with the chief complaint of fall. He had a witnessed fall after which he had a headache and confusion prompting presentation to the ER.   ? ?He had an initial CT showing SAH but no substantial compression.  CTA negative for aneurysm.  Around 5 am, he became more and more agitated and then had an aspiration event.  He was intubated for worsening oxygenation, then taken for a new CT scan.  This scan showed development of a new left subdural hematoma with new midline shift and brain compression. ? ?The patient is unable to give history due to his altered mental status. ? ?Review of Systems:  ?A 10 point review of systems is negative, except for the pertinent positives and negatives detailed in the HPI. ? ?Past Medical History: ?Past Medical History:  ?Diagnosis Date  ? Diabetes mellitus without complication ([place])   ? Hypercholesteremia   ? Hypertension   ? ? ?Past Surgical History: ?Past Surgical History:  ?Procedure Laterality Date  ? CATARACT EXTRACTION Bilateral   ? COLONOSCOPY WITH PROPOFOL N/A 12/12/2017  ? Procedure: COLONOSCOPY WITH PROPOFOL;  Surgeon: [name], [name], MD;  Location: [facility] ENDOSCOPY;  Service: Gastroenterology;  Laterality: N/A;  ? EYE SURGERY    ? ? ?Allergies: ?Allergies as of 11/22/2021  ? (No Known Allergies)  ? ? ?Medications: ? ?Current Facility-Administered Medications:  ?   stroke: early stages of recovery book, , Does not apply, Once, Mansy, Jan A, MD ?  0.9 %  sodium chloride infusion, , Intravenous, Continuous, Mansy, Jan A, MD ?  acetaminophen (TYLENOL) tablet 650 mg, 650 mg, Oral, Q4H PRN **OR** acetaminophen (TYLENOL) 160 MG/5ML solution 650 mg, 650 mg, Per Tube, Q4H PRN **OR** acetaminophen (TYLENOL) suppository 650 mg, 650 mg,  Rectal, Q4H PRN, Mansy, Jan A, MD ?  Ampicillin-Sulbactam (UNASYN) 3 g in sodium chloride 0.9 % 100 mL IVPB, 3 g, Intravenous, Q6H, Belue, Alver Sorrow, RPH ?  atorvastatin (LIPITOR) tablet 40 mg, 40 mg, Per Tube, Daily, Rust-Robertlee Rogacki, Toribio Harbour L, NP ?  chlorhexidine gluconate (MEDLINE KIT) (PERIDEX) 0.12 % solution 15 mL, 15 mL, Mouth Rinse, BID, Rust-Starlyn Droge, Toribio Harbour L, NP ?  cholecalciferol (VITAMIN D3) tablet 2,000 Units, 2,000 Units, Oral, Daily, Mansy, Jan A, MD ?  docusate (COLACE) 50 MG/5ML liquid 100 mg, 100 mg, Per Tube, BID, Rust-Ahuva Poynor, Toribio Harbour L, NP ?  etomidate (AMIDATE) 2 MG/ML injection, , , ,  ?  etomidate (AMIDATE) injection, , Intravenous, PRN, Ward, Kristen N, DO, 30 mg at 12/12/2021 0522 ?  fentaNYL (SUBLIMAZE) bolus via infusion 25-100 mcg, 25-100 mcg, Intravenous, Q15 min PRN, Rust-Loney Peto, Toribio Harbour L, NP ?  fentaNYL (SUBLIMAZE) injection 25 mcg, 25 mcg, Intravenous, Once, Rust-Rilan Eiland, Toribio Harbour L, NP ?  fentaNYL 2526mg in NS 2512m(1011mml) infusion-PREMIX, 25-200 mcg/hr, Intravenous, Continuous, Rust-Aeron Donaghey, Britton L, NP ?  insulin aspart (novoLOG) injection 0-15 Units, 0-15 Units, Subcutaneous, Q4H, Rust-Donice Alperin, Britton L, NP ?  loratadine (CLARITIN) tablet 10 mg, 10 mg, Per Tube, Daily, Rust-Kano Heckmann, Britton L, NP ?  mannitol 25 % injection 50 g, 50 g, Intravenous, Once, Ward, KriDelice BisonO ?  MEDLINE mouth rinse, 15 mL, Mouth Rinse, 10 times per day, Rust-Morgana Rowley, Britton L, NP ?  midazolam (VERSED) injection 1 mg, 1 mg, Intravenous, Q15 min PRN, Rust-Selita Staiger, BriHuel Cote  NP ?  midazolam (VERSED) injection 1 mg, 1 mg, Intravenous, Q2H PRN, Rust-Jerlean Peralta, Toribio Harbour L, NP ?  ondansetron (ZOFRAN) injection 4 mg, 4 mg, Intravenous, Q4H PRN, Mansy, Jan A, MD ?  ondansetron [facility]) injection 4 mg, 4 mg, Intravenous, Once, Ward, Kristen N, DO ?  pantoprazole sodium (PROTONIX) 40 mg/20 mL oral suspension 40 mg, 40 mg, Per Tube, Daily, Rust-Mariaelena Cade, Toribio Harbour L, NP ?  polyethylene glycol (MIRALAX /  GLYCOLAX) packet 17 g, 17 g, Per Tube, Daily, Rust-Shweta Aman, Toribio Harbour L, NP ?  potassium chloride (KLOR-CON) packet 40 mEq, 40 mEq, Per Tube, Once, Rust-Nowell Sites, Toribio Harbour L, NP ?  potassium chloride 10 mEq in 100 mL IVPB, 10 mEq, Intravenous, Q1 Hr x 4, Rust-Shevonne Wolf, Britton L, NP ?  propofol (DIPRIVAN) 1000 MG/100ML infusion, 5-50 mcg/kg/min, Intravenous, Continuous, Rust-Zayvion Stailey, Britton L, NP ?  succinylcholine (ANECTINE) 200 MG/10ML syringe, , , ,  ?  succinylcholine (ANECTINE) injection, , Intravenous, PRN, Ward, Kristen N, DO, 120 mg at 11/23/2021 0523 ?  traZODone (DESYREL) tablet 25 mg, 25 mg, Per Tube, QHS PRN, Rust-Isabele Lollar, Huel Cote, NP ? ?Current Outpatient Medications:  ?  acetaminophen (TYLENOL) 325 MG tablet, Take 650 mg by mouth every 6 (six) hours as needed., Disp: , Rfl:  ?  aspirin EC 81 MG tablet, Take 81 mg by mouth daily., Disp: , Rfl:  ?  atorvastatin (LIPITOR) 40 MG tablet, Take 40 mg by mouth daily., Disp: , Rfl:  ?  cetirizine (ZYRTEC) 10 MG tablet, Take 10 mg by mouth daily., Disp: , Rfl:  ?  cholecalciferol (VITAMIN D) 400 units TABS tablet, Take 2,000 Units by mouth daily., Disp: , Rfl:  ?  lisinopril-hydrochlorothiazide (PRINZIDE,ZESTORETIC) 10-12.5 MG tablet, Take 1 tablet by mouth daily., Disp: , Rfl:  ?  metFORMIN (GLUCOPHAGE-XR) 500 MG 24 hr tablet, Take 500 mg by mouth daily with breakfast., Disp: , Rfl:  ?  Multiple Vitamin (MULTIVITAMIN) capsule, Take 1 capsule by mouth daily., Disp: , Rfl:  ?  multivitamin-lutein (OCUVITE-LUTEIN) CAPS capsule, Take 6 capsules by mouth daily., Disp: , Rfl:  ?  Omega-3 Fatty Acids (FISH OIL) 1000 MG CAPS, Take 1,000 mg by mouth 1 day or 1 dose., Disp: , Rfl:  ?  omeprazole (PRILOSEC) 20 MG capsule, Take 20 mg by mouth daily., Disp: , Rfl:  ?  sitaGLIPtin (JANUVIA) 100 MG tablet, Take 50 mg by mouth daily., Disp: , Rfl:  ?  UNABLE TO FIND, Take by mouth once., Disp: , Rfl:  ? ? ?Social History: ?Social History  ? ?Tobacco Use  ? Smoking status: Former   ?  Types: Cigarettes  ?  Quit date: 56  ?  Years since quitting: 42.3  ? Smokeless tobacco: Never  ?Vaping Use  ? Vaping Use: Never used  ?Substance Use Topics  ? Alcohol use: Yes  ?  Alcohol/week: 14.0 standard drinks  ?  Types: 14 Shots of liquor per week  ? Drug use: Never  ? ? ?Family Medical History: ?No family history on file. ? ?Physical Examination: ?Vitals:  ? 12/11/2021 0145 11/19/2021 0445  ?BP: 112/60 137/71  ?Pulse: 77 75  ?Resp: 14   ?Temp:    ?SpO2: 98% 95%  ? ?Intubated - has evidence of aspiration PNA.  Heart RRR ? ?General: Patient is well developed, well nourished. ? ?Psychiatric: Patient is non-anxious. ? ?Head:  Pupils equal, round, and reactive to light. ? ?ENT:  Oral mucosa appears well hydrated. ? ?Neck:   Supple.   ? ?Respiratory: Patient is intubated ? ?  Extremities: No edema. ? ?Vascular: Palpable pulses in dorsal pedal vessels. ? ?Skin:   On exposed skin, there are no abnormal skin lesions. ? ?NEUROLOGICAL:  ?General: intubated, sedated ?He opens eyes spontaneous.  He does not follow commands.  He does not answer questions.  He is moving all 4 limbs spontaneously. He nearly localizes with his R arm. ? ?Grimace is symmetric.  He does not protrude tongue.  He does not cooperate with drift. ? ?He is at least antigravity in all 4 limbs.  Moves R more briskly than L. ? ?Sensory examination unreliable.  Reflex and Gait examination deferred. ? ?Imaging: ?CT Head 12/08/2021 ?IMPRESSION: ?1. New subdural hematoma along the majority of the left cerebral ?convexity measuring up to 1 cm in thickness and primarily ?responsible for new 7 mm midline shift. ?2. Some thickening of subarachnoid hemorrhage although stable ?pattern. ?3. Trace subdural hematoma along the anterior right frontal ?convexity. ?4. Early visualization of cerebral contusion in the bilateral ?inferior frontal and left temporal lobes. ?  ?  ?Electronically Signed ?  By: Jorje Guild M.D. ?  On: 11/29/2021 06:13 ? ?I have personally  reviewed the images and agree with the above interpretation. ? ?Labs: ? ?  Latest Ref Rng & Units 12/13/2021  ? 12:03 AM  ?CBC  ?WBC 4.0 - 10.5 K/uL 8.2    ?Hemoglobin 13.0 - 17.0 g/dL 12.5    ?Hematocrit 39.0 - 52.0

## 2021-11-17 NOTE — ED Notes (Addendum)
20 mg bolus dose given per Dr. Leonides Schanz due to pt aggitation. ?

## 2021-11-17 NOTE — Assessment & Plan Note (Signed)
-   The patient will be admitted to stepdown unit bed. ?- We will follow neurochecks per [facility] protocol. ?- We will repeat his head CT scan in 6 hours from the first CT. ?- Neurosurgery consultation will be obtained. ?- Dr. Izora Ribas was notified and is aware about the patient. ?- We will obviously hold his aspirin. ?

## 2021-11-17 NOTE — Anesthesia Preprocedure Evaluation (Addendum)
Anesthesia Evaluation  ?Patient identified by MRN, date of birth, ID band ?Patient unresponsive ? ? ? ?Reviewed: ?Patient's Chart, lab work & pertinent test results, Unable to perform ROS - Chart review onlyPreop documentation limited or incomplete due to emergent nature of procedure. ? ?Airway ?Mallampati: III ? ?TM Distance: >3 FB ?Neck ROM: full ? ? ? Dental ?no notable dental hx. ? ?  ?Pulmonary ?former smoker,  ?  ?Pulmonary exam normal ? ? ? ? ? ? ? Cardiovascular ?hypertension, Normal cardiovascular exam ? ?EKG 5/4: ?Sinus rhythm ?Prolonged PR interval ?Incomplete right bundle branch block ?Low voltage, precordial leads ?  ?Neuro/Psych ?SAH (subarachnoid hemorrhage ?negative psych ROS  ? GI/Hepatic ?Neg liver ROS, GERD  Medicated,  ?Endo/Other  ?diabetes, Type 2 ? Renal/GU ?  ? ?  ?Musculoskeletal ? ? Abdominal ?Normal abdominal exam  (+)   ?Peds ? Hematology ?negative hematology ROS ?(+)   ?Anesthesia Other Findings ?He had a witnessed fall after which he had a headache and confusion prompting presentation to the ER.  Around 5 am, he became more and more agitated and then had an aspiration event. Per NSGY, L to R midline shift and brain compression.  GCS currently [id number]=9T. ?mannitol ordered by surgeon. Possible need for platelets due to rapid progression of bleed. Pt is on aspirin.  ? ? ?CT HEAD: ?CT of the head: Diffuse subarachnoid hemorrhage as described above ?concentrated predominately in the left sylvian fissure. 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 CTA of the head is ?recommended for further evaluation. ? ?Hyperglycemia ?Hypokalemia ?Ethanol 188 ? ?Past Medical History: ?No date: Diabetes mellitus without complication ([place]) ?No date: Hypercholesteremia ?No date: Hypertension ? ?Past Surgical History: ?No date: CATARACT EXTRACTION; Bilateral ?12/12/2017: COLONOSCOPY WITH PROPOFOL; N/A ?     Comment:  Procedure: COLONOSCOPY WITH PROPOFOL;  Surgeon: [place],  ?             [name], MD;  Location: [facility] ENDOSCOPY;  Service:  ?             Gastroenterology;  Laterality: N/A; ?No date: EYE SURGERY ? ?BMI   ? Body Mass Index: 27.26 kg/m?  ?  ? ? Reproductive/Obstetrics ?negative OB ROS ? ?  ? ? ? ? ? ? ? ? ? ? ? ? ? ?  ?  ? ? ? ? ? ? ?Anesthesia Physical ?Anesthesia Plan ? ?ASA: 4 and emergent ? ?Anesthesia Plan: General ETT  ? ?Post-op Pain Management:   ? ?Induction: Intravenous ? ?PONV Risk Score and Plan: Dexamethasone and Treatment may vary due to age or medical condition ? ?Airway Management Planned: Oral ETT ? ?Additional Equipment: Arterial line ? ?Intra-op Plan:  ? ?Post-operative Plan: Post-operative intubation/ventilation ? ?Informed Consent: I have reviewed the patients History and Physical, chart, labs and discussed the procedure including the risks, benefits and alternatives for the proposed anesthesia with the patient or authorized representative who has indicated his/her understanding and acceptance.  ? ? ? ?Dental Advisory Given, Only emergency history available and Consent reviewed with POA ? ?Plan Discussed with: Anesthesiologist, CRNA and Surgeon ? ?Anesthesia Plan Comments:   ? ? ? ? ? ?Anesthesia Quick Evaluation ? ?

## 2021-11-17 NOTE — ED Notes (Signed)
Banana bag ordered from pharmacy  ?

## 2021-11-17 NOTE — ED Provider Notes (Signed)
? ?[facility] ?Provider Note ? ? ? Event Date/Time  ? First MD Initiated Contact with Patient 11/18/2021 2351   ?  (approximate) ? ? ?History  ? ?Altered Mental Status and Fall ? ? ?HPI ? ?Michael Cohen is a 81 y.o. male with history of hypertension, diabetes, hyperlipidemia who presents to the emergency department with EMS from [facility] independent facility after he fell at home.  Wife reports he went outside to walk the dog and came back and she found him on the floor of the hallway.  Fall was unwitnessed.  He was confused.  He is on aspirin but no other antiplatelet or anticoagulant.  Has a hematoma to the back of the head.  He is unable to tell us what happened.  No witnessed seizure-like activity.  He denies any pain.  Admits to drinking alcohol tonight.  Blood glucose was 124 with EMS. ? ? ?History provided by patient and EMS. ? ? ? ?Past Medical History:  ?Diagnosis Date  ? Diabetes mellitus without complication ([place])   ? Hypercholesteremia   ? Hypertension   ? ? ?Past Surgical History:  ?Procedure Laterality Date  ? CATARACT EXTRACTION Bilateral   ? COLONOSCOPY WITH PROPOFOL N/A 12/12/2017  ? Procedure: COLONOSCOPY WITH PROPOFOL;  Surgeon: [name], [name], MD;  Location: [facility] ENDOSCOPY;  Service: Gastroenterology;  Laterality: N/A;  ? EYE SURGERY    ? ? ?MEDICATIONS:  ?Prior to Admission medications   ?Medication Sig Start Date End Date Taking? Authorizing Provider  ?acetaminophen (TYLENOL) 325 MG tablet Take 650 mg by mouth every 6 (six) hours as needed.    [provider]  ?aspirin EC 81 MG tablet Take 81 mg by mouth daily.    [provider]  ?atorvastatin (LIPITOR) 40 MG tablet Take 40 mg by mouth daily.    [provider]  ?cetirizine (ZYRTEC) 10 MG tablet Take 10 mg by mouth daily.    [provider]  ?cholecalciferol (VITAMIN D) 400 units TABS tablet Take 2,000 Units by mouth daily.    [provider]   ?lisinopril-hydrochlorothiazide (PRINZIDE,ZESTORETIC) 10-12.5 MG tablet Take 1 tablet by mouth daily.    [provider]  ?metFORMIN (GLUCOPHAGE-XR) 500 MG 24 hr tablet Take 500 mg by mouth daily with breakfast.    [provider]  ?Multiple Vitamin (MULTIVITAMIN) capsule Take 1 capsule by mouth daily.    [provider]  ?multivitamin-lutein (OCUVITE-LUTEIN) CAPS capsule Take 6 capsules by mouth daily.    [provider]  ?Omega-3 Fatty Acids (FISH OIL) 1000 MG CAPS Take 1,000 mg by mouth 1 day or 1 dose.    [provider]  ?omeprazole (PRILOSEC) 20 MG capsule Take 20 mg by mouth daily.    [provider]  ?sitaGLIPtin (JANUVIA) 100 MG tablet Take 50 mg by mouth daily.    [provider]  ?UNABLE TO FIND Take by mouth once.    [provider]  ? ? ?Physical Exam  ? ?Triage Vital Signs: ?ED Triage Vitals  ?Enc Vitals Group  ?   BP 11/24/2021 2359 117/75  ?   Pulse Rate 12/12/2021 2359 63  ?   Resp 11/22/2021 2359 18  ?   Temp 11/24/2021 2359 (!) 97.3 ?F (36.3 ?C)  ?   Temp Source 11/24/2021 2359 Oral  ?   SpO2 11/22/2021 2359 96 %  ?   Weight --   ?   Height 12/11/2021 0000 '5\' 10"'$  (1.778 m)  ?  Head Circumference --   ?   Peak Flow --   ?   Pain Score --   ?   Pain Loc --   ?   Pain Edu? --   ?   Excl. in [place]? --   ? ? ?Most recent vital signs: ?Vitals:  ? 12/03/2021 0130 11/27/2021 0145  ?BP: 114/68 112/60  ?Pulse: 71 77  ?Resp: 13 14  ?Temp:    ?SpO2: 97% 98%  ? ? ? ?CONSTITUTIONAL: Alert and oriented to person and place but not time or situation.  Elderly.  Smells of alcohol. ?HEAD: Normocephalic; hematoma and abrasion to the posterior scalp ?EYES: Conjunctivae clear, PERRL, EOMI ?ENT: normal nose; no rhinorrhea; moist mucous membranes; pharynx without lesions noted; no dental injury; no septal hematoma, no epistaxis; no facial deformity or bony tenderness ?NECK: Supple, no midline spinal tenderness, step-off or deformity; trachea midline ?CARD: RRR; S1 and  S2 appreciated; no murmurs, no clicks, no rubs, no gallops ?RESP: Normal chest excursion without splinting or tachypnea; breath sounds clear and equal bilaterally; no wheezes, no rhonchi, no rales; no hypoxia or respiratory distress ?CHEST:  chest wall stable, no crepitus or ecchymosis or deformity, nontender to palpation; no flail chest ?ABD/GI: Normal bowel sounds; non-distended; soft, non-tender, no rebound, no guarding; no ecchymosis or other lesions noted ?PELVIS:  stable, nontender to palpation ?BACK:  The back appears normal; no midline spinal tenderness, step-off or deformity ?EXT: Normal ROM in all joints; non-tender to palpation; no edema; normal capillary refill; no cyanosis, no bony tenderness or bony deformity of patient's extremities, no joint effusion, compartments are soft, extremities are warm and well-perfused, no ecchymosis ?SKIN: Normal color for age and race; warm ?NEURO: No facial asymmetry, normal speech, moving all extremities equally, unable to reliably test sensation at this time ? ?ED Results / Procedures / Treatments  ? ?LABS: ?(all labs ordered are listed, but only abnormal results are displayed) ?Labs Reviewed  ?CBC - Abnormal; Notable for the following components:  ?    Result Value  ? Hemoglobin 12.5 (*)   ? MCH 25.5 (*)   ? RDW 16.7 (*)   ? All other components within normal limits  ?COMPREHENSIVE METABOLIC PANEL - Abnormal; Notable for the following components:  ? Potassium 3.3 (*)   ? Glucose, Bld 140 (*)   ? Total Protein 6.4 (*)   ? All other components within normal limits  ?ETHANOL - Abnormal; Notable for the following components:  ? Alcohol, Ethyl (B) 188 (*)   ? All other components within normal limits  ?CBG MONITORING, ED - Abnormal; Notable for the following components:  ? Glucose-Capillary 152 (*)   ? All other components within normal limits  ?URINALYSIS, ROUTINE W REFLEX MICROSCOPIC  ?URINE DRUG SCREEN, QUALITATIVE ([facility] ONLY)  ?PROTIME-INR  ?TROPONIN I (HIGH  SENSITIVITY)  ?TROPONIN I (HIGH SENSITIVITY)  ? ? ? ?EKG: ? ? ? Date: 12/05/2021 23:55 ? Rate: 63 ? Rhythm: normal sinus rhythm ? QRS Axis: normal ? Intervals: normal ? ST/T Wave abnormalities: normal ? Conduction Disutrbances: none ? Narrative Interpretation: unremarkable ? ? ? ? ? ?RADIOLOGY: ?My personal review and interpretation of imaging: CT head shows subarachnoid hemorrhage in the left sylvian fissure.  CTA shows no aneurysm.  CT cervical spine shows no fracture or dislocation. ? ?I have personally reviewed all radiology reports. ?CT Angio Head W or Wo Contrast ? ?Result Date: 11/26/2021 ?CLINICAL DATA:  Subarachnoid hemorrhage EXAM: CT ANGIOGRAPHY HEAD TECHNIQUE: Multidetector CT imaging of the  head was performed using the standard protocol during bolus administration of intravenous contrast. Multiplanar CT image reconstructions and MIPs were obtained to evaluate the vascular anatomy. RADIATION DOSE REDUCTION: This exam was performed according to the departmental dose-optimization program which includes automated exposure control, adjustment of the mA and/or kV according to patient size and/or use of iterative reconstruction technique. CONTRAST:  75m OMNIPAQUE IOHEXOL 350 MG/ML SOLN COMPARISON:  None Available. FINDINGS: POSTERIOR CIRCULATION: --Vertebral arteries: Normal --Inferior cerebellar arteries: Normal. --Basilar artery: Normal. --Superior cerebellar arteries: Normal. --Posterior cerebral arteries: Normal. ANTERIOR CIRCULATION: --Intracranial internal carotid arteries: Normal. --Anterior cerebral arteries (ACA): Normal. --Middle cerebral arteries (MCA): Normal. ANATOMIC VARIANTS: None Review of the MIP images confirms the above findings. IMPRESSION: No aneurysm, occlusion or high-grade stenosis of the intracranial arteries. Electronically Signed   By: KUlyses JarredM.D.   On: 11/19/2021 01:25  ? ?CT HEAD WO CONTRAST (5MM) ? ?Result Date: 11/25/2021 ?CLINICAL DATA:  Recent fall with altered mental  status EXAM: CT HEAD WITHOUT CONTRAST CT CERVICAL SPINE WITHOUT CONTRAST TECHNIQUE: Multidetector CT imaging of the head and cervical spine was performed following the standard protocol without intravenous contrast. Multiplanar CT image recons

## 2021-11-17 NOTE — Progress Notes (Signed)
Pharmacy Antibiotic Note ? ?Michael Cohen is a 81 y.o. male admitted on 11/29/2021 with aspiration pneumonia.  Pharmacy has been consulted for Unasyn dosing. ? ?Plan: ?Unasyn 3 gm q6h x 5 day per indication & renal fxn ? ?Pharmacy will continue to follow and will adjust and/or extend abx dosing if warranted. ? ?Height: '5\' 10"'$  (177.8 cm) ?Weight: 86.2 kg (190 lb) ?IBW/kg (Calculated) : 73 ? ?Temp (24hrs), Avg:97.3 ?F (36.3 ?C), Min:97.3 ?F (36.3 ?C), Max:97.3 ?F (36.3 ?C) ? ?Recent Labs  ?Lab 11/25/2021 ?0003  ?WBC 8.2  ?CREATININE 0.67  ?  ?Estimated Creatinine Clearance: 77.3 mL/min (by C-G formula based on SCr of 0.67 mg/dL).   ? ?No Known Allergies ? ?Antimicrobials this admission: ?5/04 Unasyn >> x 5 days ? ?Microbiology results: ?No lab cx ordered or pending at this time. ? ?Thank you for allowing pharmacy to be a part of this patient?s care. ? ?Renda Rolls, PharmD, MBA ?11/15/2021 ?6:47 AM ? ? ?

## 2021-11-17 NOTE — ED Notes (Signed)
Report given to OR.

## 2021-11-18 ENCOUNTER — Inpatient Hospital Stay: Payer: Medicare Other

## 2021-11-18 DIAGNOSIS — I609 Nontraumatic subarachnoid hemorrhage, unspecified: Secondary | ICD-10-CM | POA: Diagnosis not present

## 2021-11-18 LAB — TYPE AND SCREEN
ABO/RH(D): O POS
Antibody Screen: NEGATIVE
Unit division: 0
Unit division: 0

## 2021-11-18 LAB — BPAM RBC
Blood Product Expiration Date: 202306012359
Blood Product Expiration Date: 202306012359
Unit Type and Rh: 5100
Unit Type and Rh: 5100

## 2021-11-18 LAB — MAGNESIUM: Magnesium: 1.5 mg/dL — ABNORMAL LOW (ref 1.7–2.4)

## 2021-11-18 LAB — BASIC METABOLIC PANEL
Anion gap: 9 (ref 5–15)
BUN: 10 mg/dL (ref 8–23)
CO2: 24 mmol/L (ref 22–32)
Calcium: 7.2 mg/dL — ABNORMAL LOW (ref 8.9–10.3)
Chloride: 107 mmol/L (ref 98–111)
Creatinine, Ser: 0.61 mg/dL (ref 0.61–1.24)
GFR, Estimated: >60 mL/min (ref >60)
Glucose, Bld: 164 mg/dL — ABNORMAL HIGH (ref 70–99)
Potassium: 3.2 mmol/L — ABNORMAL LOW (ref 3.5–5.1)
Sodium: 140 mmol/L (ref 135–145)

## 2021-11-18 LAB — PREPARE PLATELET PHERESIS: Unit division: 0

## 2021-11-18 LAB — BPAM PLATELET PHERESIS
Blood Product Expiration Date: 202305052359
ISSUE DATE / TIME: 202305041427
Unit Type and Rh: 5100

## 2021-11-18 LAB — GLUCOSE, CAPILLARY
Glucose-Capillary: 119 mg/dL — ABNORMAL HIGH (ref 70–99)
Glucose-Capillary: 140 mg/dL — ABNORMAL HIGH (ref 70–99)
Glucose-Capillary: 150 mg/dL — ABNORMAL HIGH (ref 70–99)
Glucose-Capillary: 152 mg/dL — ABNORMAL HIGH (ref 70–99)
Glucose-Capillary: 171 mg/dL — ABNORMAL HIGH (ref 70–99)
Glucose-Capillary: 208 mg/dL — ABNORMAL HIGH (ref 70–99)

## 2021-11-18 LAB — PREPARE RBC (CROSSMATCH)

## 2021-11-18 LAB — HEMOGLOBIN A1C
Hgb A1c MFr Bld: 6.9 % — ABNORMAL HIGH (ref 4.8–5.6)
Mean Plasma Glucose: 151.33 mg/dL

## 2021-11-18 LAB — TRIGLYCERIDES: Triglycerides: 55 mg/dL (ref <150)

## 2021-11-18 LAB — PHOSPHORUS: Phosphorus: 3.2 mg/dL (ref 2.5–4.6)

## 2021-11-18 IMAGING — DX DG ABD PORTABLE 1V
1 series · 1 of 1 positions shown · non-contrast
Comparison: [DATE]

CLINICAL DATA: Feeding tube placement

EXAM:
PORTABLE ABDOMEN - 1 VIEW

[abdomen supine]
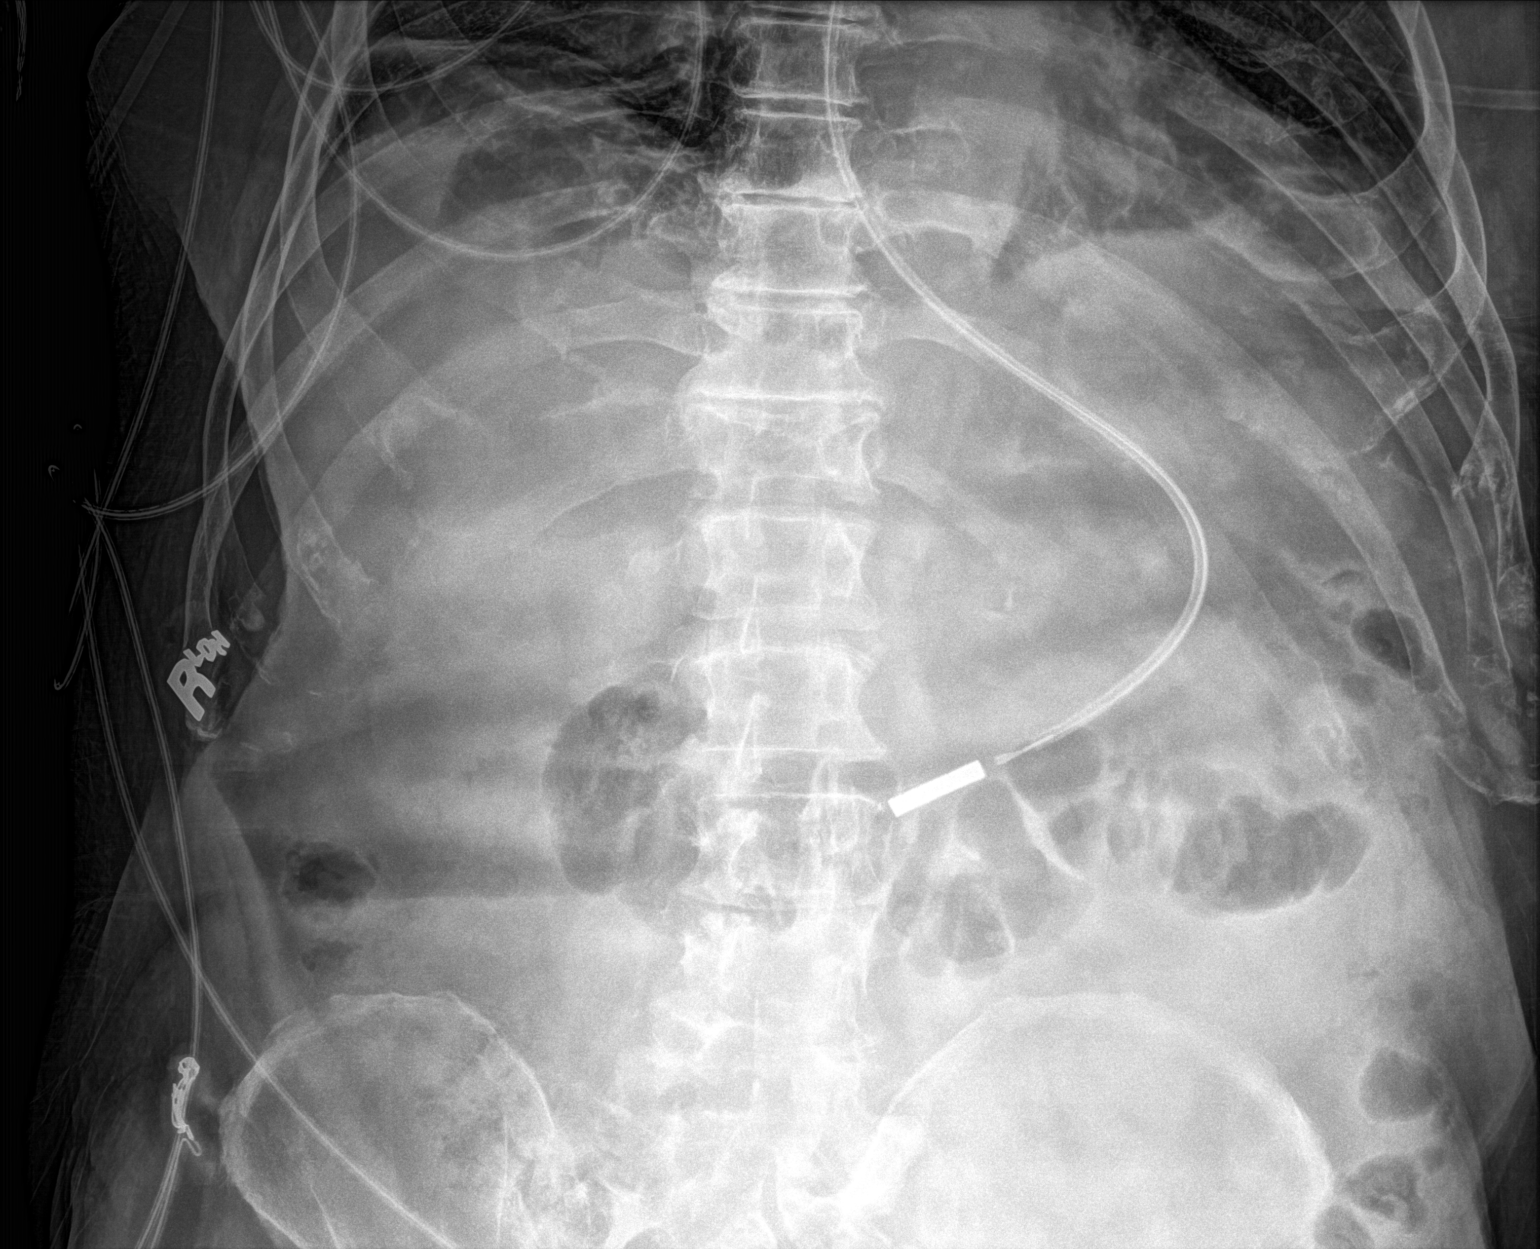

[1 of 1 positions shown; findings below may reference images not displayed]

FINDINGS: Limited radiograph of the lower chest and upper abdomen was obtained
for the purposes of enteric tube localization. A weighted enteric
tube is seen coursing below the diaphragm with distal tip
terminating within the expected location of the gastric body.
IMPRESSION: Enteric tube within the gastric body.

## 2021-11-18 MED ORDER — POTASSIUM CHLORIDE 10 MEQ/100ML IV SOLN
10.0000 meq | INTRAVENOUS | Status: DC
  Filled 2021-11-18 (×4): qty 100

## 2021-11-18 MED ORDER — VITAL HIGH PROTEIN PO LIQD
1000.0000 mL | ORAL | Status: DC
  Administered 2021-11-18 – 2021-11-20 (×2): 1000 mL

## 2021-11-18 MED ORDER — POTASSIUM CHLORIDE 20 MEQ PO PACK: 40.0000 meq | PACK | Freq: Once | ORAL | Status: DC

## 2021-11-18 MED ORDER — POTASSIUM CHLORIDE 20 MEQ PO PACK
40.0000 meq | PACK | Freq: Once | ORAL | Status: AC
  Administered 2021-11-18: 40 meq
  Filled 2021-11-18: qty 2

## 2021-11-18 MED ORDER — MAGNESIUM SULFATE 2 GM/50ML IV SOLN
2.0000 g | Freq: Once | INTRAVENOUS | Status: AC
  Administered 2021-11-18: 2 g via INTRAVENOUS
  Filled 2021-11-18: qty 50

## 2021-11-18 NOTE — Progress Notes (Signed)
? ?NAME:  Kolin Erdahl, MRN:  [id number], DOB:  1941/06/16, LOS: 1 ?ADMISSION DATE:  11/27/2021, CONSULTATION DATE:  12/14/2021 ?REFERRING MD:  Dr. Sidney Ace, CHIEF COMPLAINT: Fall   ? ?History of Present Illness:  ?81 year old male presenting to [facility] ED from [facility] on 11/24/2021 via EMS.  Per the patient's wife, he had gone out to walk the dog that evening and had fallen in the entryway hitting his head on the corner of the baseboard.  She reported he was altered post fall with complaints of a headache. Patient is not on systemic blood thinners but does take a baby aspirin every other day for heart health.  Wife confirms that the patient drinks 2 scotches daily and sometimes wine with dinner, unclear the exact amount of scotch.  She denies any other recent complaints.  No reports of witnessed seizure-like activity and the patient did confirm that he had drinking alcohol that evening. ?ED course: ?Head CT showed large subarachnoid hemorrhage.  Follow-up CTA revealed no aneurysm.  EDP spoke with Dr. Cari Caraway with neurosurgery who initially recommended admitting to hospitalist service with repeat CT in 6 hours.  TRH admitted patient to stepdown unit.  However, patient still boarding in ED when he became agitated attempting to get out of bed and then vomited.  At this point EDP described patient as having gurgling respirations with SPO2 83% on room air.  Patient was emergently intubated requiring mechanical ventilatory support.  Stat repeat head CT revealed new subdural bleed with 7 mm midline shift.  Dr. Cari Caraway from neurosurgery made aware of change, discussed options bedside with wife and the decision was made to take the patient urgently to the OR and transferred directly to ICU. ?medications given: Keppra 1000 mg, mannitol 50 g, IV contrast, fentanyl, 1 L NS, Tdap, banana bag, etomidate, succinylcholine, propofol drip started ?Initial Vitals: 97.3 F, 17, 62, 117/67 and SPO2 95% on room air ?Significant labs:  (Labs/ Imaging personally reviewed) ?I, Domingo Pulse Rust-Chester, AGACNP-BC, personally viewed and interpreted this ECG. ?EKG Interpretation: Date: 11/25/2021, EKG Time: 23: 55, Rate: 63, Rhythm: First-degree heart block, QRS Axis: Normal,  ?Intervals: First-degree heart block, borderline prolonged QTc, ST/T Wave abnormalities: None, Narrative Interpretation: NSR with first-degree heart block ?Chemistry: Na+:139, K+: 3.3, BUN/Cr.: 8/0.67, Serum CO2/ AG: 24/13 ?Hematology: WBC: 8.2, Hgb: 12.5,  ?Troponin: 6 >5, COVID-19 & Influenza A/B: pending ?ABG: pending ?CXR 11/23/2021: Low lung volumes with probable areas of passive subsegmental atelectasis, as above ?CT head Wo contrast 11/25/2021: Diffuse subarachnoid hemorrhage as described above concentrated predominately in the left sylvian fissure. 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 CTA of the head is ?recommended for further evaluation. Chronic atrophic and ischemic changes are noted ?CT cervical spine 12/07/2021: Degenerative change without acute abnormality ?CT angio head 12/04/2021: No aneurysm, occlusion or high-grade stenosis of the intracranial arteries ?CT head wo contrast 11/20/2021: New subdural hematoma along the majority of the left cerebral convexity measuring up to 1 cm in thickness and primarily responsible for new 7 mm midline shift. Some thickening of subarachnoid hemorrhage although stable pattern. Trace subdural hematoma along the anterior right frontal convexity. Early visualization of cerebral contusion in the bilateral inferior frontal and left temporal lobes. ? ?PCCM consulted due to deterioration requiring emergent intubation and mechanical ventilatory support. ? 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 ?Significant Hospital Events: ?Including procedures, antibiotic start and stop dates in addition to other pertinent events   ?  11/25/2021: Admit to ICU with  worsening SAH after emergent intubation on mechanical ventilatory support with plans to go urgently to OR for neurosurgery ?5/5 s/p craniotomy for TRAUMATIC SAH/subdural hematoma, remains on vent ?5/5 remains on  vent ? ?Interim History / Subjective:  ?Remains on vent ?+shock ?Started pressors ?+brain injury and damage ?Follow up Neuro recs ? ? ? ?Objective   ?Blood pressure (!) 102/58, pulse 68, temperature 99.9 ?F (37.7 ?C), resp. rate 17, height 5' 10"  (1.778 m), weight 86.2 kg, SpO2 97 %. ?   ?Vent Mode: PRVC ?FiO2 (%):  [35 %-80 %] 35 % ?Set Rate:  [16 bmp] 16 bmp ?Vt Set:  [500 mL] 500 mL ?PEEP:  [5 cmH20-8 cmH20] 5 cmH20 ?Plateau Pressure:  [14 cmH20] 14 cmH20  ? ?Intake/Output Summary (Last 24 hours) at 11/18/2021 0730 ?Last data filed at 11/18/2021 0600 ?Gross per 24 hour  ?Intake 2746.22 ml  ?Output 2777 ml  ?Net -30.78 ml  ? ? ?Filed Weights  ? 12/02/2021 0143  ?Weight: 86.2 kg  ? ? ?REVIEW OF SYSTEMS ? ?PATIENT IS UNABLE TO PROVIDE COMPLETE REVIEW OF SYSTEMS DUE TO SEVERE CRITICAL ILLNESS AND TOXIC METABOLIC ENCEPHALOPATHY ? ? ? ?PHYSICAL EXAMINATION: ? ?GENERAL:critically ill appearing, +resp distress BANDAGES RT SIDE OF HEAD, JP DRAIN IN PLACE ?EYES: Pupils equal, round, reactive to light.  No scleral icterus.  ?MOUTH: Moist mucosal membrane. INTUBATED ?NECK: Supple.  ?PULMONARY: +rhonchi, +wheezing ?CARDIOVASCULAR: S1 and S2.  No murmurs  ?GASTROINTESTINAL: Soft, nontender, -distended. Positive bowel sounds.  ?MUSCULOSKELETAL: No swelling, clubbing, or edema.  ?NEUROLOGIC: obtunded ?SKIN:intact,warm,dry ? ? ?Assessment & Plan:  ?Acute Hypoxic / Hypercapnic Respiratory Failure secondary to aspiration in the setting of Traumatic SAH/Subdural Hematoma s/p craniotomy ?Risk for Aspiration Pneumonia ? ? ?Severe ACUTE Hypoxic and Hypercapnic Respiratory Failure ?-continue Mechanical Ventilator support ?-Wean Fio2 and PEEP as tolerated ?-VAP/VENT bundle implementation ?- Wean PEEP & FiO2 as tolerated, maintain SpO2  > 88% ?- Head of bed elevated 30 degrees, VAP protocol in place ?- Plateau pressures less than 30 cm H20  ?- Intermittent chest x-ray & ABG PRN ?- Ensure adequate pulmonary hygiene  ?-will perform SAT/SBT when respiratory parameters are met ? ? ? ?NEUROLOGY ?ACUTE TOXIC METABOLIC ENCEPHALOPATHY ?Traumatic SAH secondary to mechanical fall PTA ?Repeat CTH shows newly developed with new 69m midline shift ?Dr. YCari Carawaybedside discussing options with spouse, overall poor prognosis. Decision made to take patient to OR for urgent intervention. ?-need for sedation ?-Goal RASS -2 to -3 ?Follow up Neuro Sx recs ? ?Hypokalemia ?-Potassium supplementation ordered ?-Daily BMP replace electrolytes as needed ? ? ? ?ETOH use ?Watch for DT's ? ? ?ENDO ?- ICU hypoglycemic\Hyperglycemia protocol ?-check FSBS per protocol ? ? ?GI ?GI PROPHYLAXIS as indicated ? ?NUTRITIONAL STATUS ?DIET--> ?Constipation protocol as indicated ? ? ?ELECTROLYTES ?-follow labs as needed ?-replace as needed ?-pharmacy consultation and following ? ? ?Best Practice (right click and "Reselect all SmartList Selections" daily)  ?Diet/type: NPO w/ meds via tube ?DVT prophylaxis: SCD ?GI prophylaxis: PPI ?Lines: N/A ?Foley:  N/A ?Code Status:  full code ? ? ?Labs   ?CBC: ?Recent Labs  ?Lab 12/09/2021 ?0003  ?WBC 8.2  ?HGB 12.5*  ?HCT 40.5  ?MCV 82.5  ?PLT 219  ? ? ? ?Basic Metabolic Panel: ?Recent Labs  ?Lab 12/13/2021 ?0003  ?NA 139  ?K 3.3*  ?CL 102  ?CO2 24  ?GLUCOSE 140*  ?BUN 8  ?CREATININE 0.67  ?CALCIUM 9.0  ? ? ?GFR: ?Estimated Creatinine Clearance: 77.3 mL/min (by  C-G formula based on SCr of 0.67 mg/dL). ?Recent Labs  ?Lab 11/18/2021 ?0003  ?WBC 8.2  ? ? ? ?Liver Function Tests: ?Recent Labs  ?Lab 12/11/2021 ?0003  ?AST 23  ?ALT 16  ?ALKPHOS 58  ?BILITOT 0.7  ?PROT 6.4*  ?ALBUMIN 3.7  ? ? ?No results for input(s): LIPASE, AMYLASE in the last 168 hours. ?No results for input(s): AMMONIA in the last 168 hours. ? ?ABG ?   ?Component Value Date/Time  ? PHART 7.37  11/20/2021 1048  ? PCO2ART 37 11/25/2021 1048  ? PO2ART 111 (H) 11/15/2021 1048  ? HCO3 21.4 12/08/2021 1048  ? ACIDBASEDEF 3.4 (H) 12/07/2021 1048  ? O2SAT 99.3 11/25/2021 1048  ?  ? ?Coagulation Profile: ?Rece

## 2021-11-18 NOTE — Progress Notes (Addendum)
PHARMACY CONSULT NOTE  ? ?Pharmacy Consult for Electrolyte Monitoring and Replacement  ? ?Recent Labs: ?Potassium (mmol/L)  ?Date Value  ?11/18/2021 3.2 (L)  ? ?Magnesium (mg/dL)  ?Date Value  ?11/18/2021 1.5 (L)  ? ?Calcium (mg/dL)  ?Date Value  ?11/18/2021 7.2 (L)  ? ?Albumin (g/dL)  ?Date Value  ?12/04/2021 3.7  ? ?Phosphorus (mg/dL)  ?Date Value  ?11/18/2021 3.2  ? ?Sodium (mmol/L)  ?Date Value  ?11/18/2021 140  ? ? ?Assessment: ?81 year old male with PMH of HTN, T2DM, ETOH use, HLD admitted with subarachnoid hemorrhage. S/p craniotomy. ? ?Ventilated and sedated on fentanyl and midazolam. On phenylephrine at 30 mcg/min for pressor support. Currently NPO. ? ?So far unsuccessful in placing NG tube at bedside. RN to attempt Dobhoff today. Patient may need fluoroscopy guided tube placement or PEG. ? ?Goal of Therapy:  ?K > 4 ?Mag > 2 ?All other electrolytes within normal limits ? ?Plan:  ?K 3.2. KCl IV 10 meq x 4.  ?Mag 1.5. Magnesium 2 grams x 1 ?Follow up labs in AM ? ? ?Addendum 1552: NG in place. IV has not been given. Ordered Kcl 40 mEq per tube.  ? ?Wynelle Cleveland, PharmD ?Pharmacy Resident  ?11/18/2021 ?1:40 PM ?

## 2021-11-18 NOTE — Progress Notes (Signed)
?   11/18/21 1100  ?Clinical Encounter Type  ?Visited With Patient and family together  ?Visit Type Follow-up  ?Spiritual Encounters  ?Spiritual Needs Prayer  ? ?Chaplain provided follow-up support ?

## 2021-11-18 NOTE — Progress Notes (Signed)
OT Cancellation Note ? ?Patient Details ?Name: Michael Cohen ?MRN: [id number] ?DOB: 1940-09-04 ? ? ?Cancelled Treatment:    Reason Eval/Treat Not Completed: Patient not medically ready. Consult received, chart reviewed. Spoke with RN this morning. Pt currently intubated, sedated. Will hold OT evaluation at this time and re-attempt at later date/time as pt is medically appropriate.  ? ? ?Ardeth Perfect., MPH, MS, OTR/L ?ascom [phone] ?11/18/21, 1:45 PM ?

## 2021-11-18 NOTE — Progress Notes (Signed)
SLP Cancellation Note ? ?Patient Details ?Name: Michael Cohen ?MRN: [id number] ?DOB: May 07, 1941 ? ? ?Cancelled treatment:       Reason Eval/Treat Not Completed: Patient not medically ready;Patient at procedure or test/unavailable;Medical issues which prohibited therapy  ? ?Per chart review, pt remains intubated at this time. Will continue to monitor for readiness/appropriateness for SLP services. ? ? ?Cherrie Gauze, M.S., CCC-SLP ?Speech-Language Pathologist ?[facility] ?[phone])  ? ?Clearnce Sorrel Lamanda Rudder ?11/18/2021, 8:02 AM ?

## 2021-11-18 NOTE — Anesthesia Postprocedure Evaluation (Signed)
Anesthesia Post Note ? ?Patient: Michael Cohen ? ?Procedure(s) Performed: CRANIOTOMY HEMATOMA EVACUATION SUBDURAL (Left: Head) ? ?Patient location during evaluation: ICU ?Anesthesia Type: General ?Level of consciousness: sedated ?Pain management: pain level controlled ?Vital Signs Assessment: post-procedure vital signs reviewed and stable ?Respiratory status: patient on ventilator - see flowsheet for VS ?Cardiovascular status: stable ?Postop Assessment: no apparent nausea or vomiting ?Anesthetic complications: no ? ? ?No notable events documented. ? ? ?Last Vitals:  ?Vitals:  ? 11/18/21 0740 11/18/21 0800  ?BP:  111/61  ?Pulse:  67  ?Resp:  (!) 9  ?Temp:  37.5 ?C  ?SpO2: 99% 98%  ?  ?Last Pain:  ?Vitals:  ? 11/18/21 0800  ?TempSrc: Bladder  ? ? ?  ?  ?  ?  ?  ?  ? ?Doreen Salvage A ? ? ? ? ?

## 2021-11-18 NOTE — Progress Notes (Signed)
? ?   Attending Progress Note ? ?History: Michael Cohen is ha 81 y.o presenting after a fall presenting to the ER with confusion and headache. Was found to have [place] with left sided SDH. S/p left craniotomy for evacuation on 11/27/2021.  ? ?POD1: Pt remains in the ICU on sedation, pressures, and intubated. Neurologically stable. Incision was bloody drainage yesterday evening. Was reinforced with additional staples without any continued issue.  ? ?Physical Exam: ?Vitals:  ? 11/18/21 0500 11/18/21 0600  ?BP:    ?Pulse: 64 68  ?Resp: 16 17  ?Temp: 98.6 ?F (37 ?C) 99.9 ?F (37.7 ?C)  ?SpO2: 98% 97%  ? ?Intubated and sedated.  ?Opens eyes to voice. Follows commands in BUE  ?Withdraws in BLE. ?JP 160 output overnight. ? ?Data: ? ?Recent Labs  ?Lab 11/26/2021 ?0003  ?NA 139  ?K 3.3*  ?CL 102  ?CO2 24  ?BUN 8  ?CREATININE 0.67  ?GLUCOSE 140*  ?CALCIUM 9.0  ? ?Recent Labs  ?Lab 12/10/2021 ?0003  ?AST 23  ?ALT 16  ?ALKPHOS 58  ?  ? Recent Labs  ?Lab 11/24/2021 ?0003  ?WBC 8.2  ?HGB 12.5*  ?HCT 40.5  ?PLT 219  ? ?Recent Labs  ?Lab 11/14/2021 ?0142  ?INR 1.0  ?  ?   ? ? ?Other tests/results:  ?Head CT 11/30/2021 (post-op) ?IMPRESSION: ?1. Status post left convexity subdural hematoma evacuation with ?resolution of midline shift. ?2. Residual mixed subdural and subarachnoid blood surrounding the ?cerebellum and over the left convexity, but the dominant component ?of the subdural hematoma has markedly decreased. ?  ?  ?Electronically Signed ?  By: Ulyses Jarred M.D. ?  On: 11/18/2021 19:03 ? ?Assessment/Plan: ? ?Michael Cohen is a 81 y.o presenting after a fall resulting in tSAH and SDH s/p craniotomy for SDH evacuation on 12/09/2021. ? ?- post-op head CT shows improvement of SDH ?- patient continues to require intubation at this time for pulmonary status. Will defer to CC for timing of weaning to extubate ?- continue JP drain for now ?-continue Keppra '500mg'$  BID x 7 days ?- Please call with any questions or concerns. ? ? ?Cooper Render  PA-C ?Department of Neurosurgery ? ?  ?

## 2021-11-18 NOTE — Plan of Care (Signed)
?  Problem: Education: ?Goal: Knowledge of disease or condition will improve ?Outcome: Not Progressing ?Goal: Knowledge of secondary prevention will improve (SELECT ALL) ?Outcome: Not Progressing ?Goal: Knowledge of patient specific risk factors will improve (INDIVIDUALIZE FOR PATIENT) ?Outcome: Not Progressing ?  ?

## 2021-11-18 NOTE — IPAL (Signed)
?  Interdisciplinary Goals of Care Family Meeting ? ? ?Date carried out: 11/18/2021 ? ?Location of the meeting: Bedside ? ?Member's involved: Physician, Bedside Registered Nurse, and Family Member or next of kin ? ? ? ? ? ? ? ?GOALS OF CARE DISCUSSION ? ?The Clinical status was relayed to family in detail- ? ?Updated and notified of patients medical condition- ?Patient remains unresponsive and will not open eyes to command.   ?Patient is having a weak cough and struggling to remove secretions.   ?Patient with increased WOB and using accessory muscles to breathe ?Explained to family course of therapy and the modalities  ? ?Patient with Progressive multiorgan failure with a very high probablity of a very minimal chance of meaningful recovery despite all aggressive and optimal medical therapy.  ?PATIENT REMAINS FULL CODE ? ?Severe brain damage  ?+ETOH abuse and withdrawal ?Drinks 2 scotches per day and 2 glasses of wine per day ?Remains on vent ?Severe delirium ? ? ? ?Family understands the situation. ? ? ? ?Family are satisfied with Plan of action and management. All questions answered ? ?Additional CC time 25 mins ? ? ?Corrin Parker, M.D.  ?[facility]  ?Medical Director [place] ?Medical Director [facility] Cardio-Pulmonary Department  ? ? ?

## 2021-11-18 NOTE — Progress Notes (Signed)
PT Cancellation Note ? ?Patient Details ?Name: Michael Cohen ?MRN: [id number] ?DOB: [birth year] ? ? ?Cancelled Treatment:    Reason Eval/Treat Not Completed: Patient not medically ready.  PT consult received.  Chart reviewed.  Pt currently intubated.  Will hold PT at this time and monitor pt's status. ? ?Leitha Bleak, PT ?11/18/21, 8:55 AM ? ?

## 2021-11-19 ENCOUNTER — Inpatient Hospital Stay: Payer: Medicare Other

## 2021-11-19 DIAGNOSIS — I609 Nontraumatic subarachnoid hemorrhage, unspecified: Secondary | ICD-10-CM | POA: Diagnosis not present

## 2021-11-19 LAB — CBC
HCT: 30.7 % — ABNORMAL LOW (ref 39.0–52.0)
Hemoglobin: 9.2 g/dL — ABNORMAL LOW (ref 13.0–17.0)
MCH: 25 pg — ABNORMAL LOW (ref 26.0–34.0)
MCHC: 30 g/dL (ref 30.0–36.0)
MCV: 83.4 fL (ref 80.0–100.0)
Platelets: 152 10*3/uL (ref 150–400)
RBC: 3.68 MIL/uL — ABNORMAL LOW (ref 4.22–5.81)
RDW: 17.1 % — ABNORMAL HIGH (ref 11.5–15.5)
WBC: 10.8 10*3/uL — ABNORMAL HIGH (ref 4.0–10.5)
nRBC: 0 % (ref 0.0–0.2)

## 2021-11-19 LAB — BASIC METABOLIC PANEL
Anion gap: 3 — ABNORMAL LOW (ref 5–15)
Anion gap: 5 (ref 5–15)
BUN: 16 mg/dL (ref 8–23)
BUN: 16 mg/dL (ref 8–23)
CO2: 27 mmol/L (ref 22–32)
CO2: 28 mmol/L (ref 22–32)
Calcium: 7.1 mg/dL — ABNORMAL LOW (ref 8.9–10.3)
Calcium: 7.6 mg/dL — ABNORMAL LOW (ref 8.9–10.3)
Chloride: 110 mmol/L (ref 98–111)
Chloride: 107 mmol/L (ref 98–111)
Creatinine, Ser: 0.52 mg/dL — ABNORMAL LOW (ref 0.61–1.24)
Creatinine, Ser: 0.6 mg/dL — ABNORMAL LOW (ref 0.61–1.24)
GFR, Estimated: >60 mL/min (ref >60)
GFR, Estimated: >60 mL/min (ref >60)
Glucose, Bld: 185 mg/dL — ABNORMAL HIGH (ref 70–99)
Glucose, Bld: 201 mg/dL — ABNORMAL HIGH (ref 70–99)
Potassium: 3.5 mmol/L (ref 3.5–5.1)
Potassium: 3.7 mmol/L (ref 3.5–5.1)
Sodium: 140 mmol/L (ref 135–145)
Sodium: 140 mmol/L (ref 135–145)

## 2021-11-19 LAB — MAGNESIUM
Magnesium: 2.3 mg/dL (ref 1.7–2.4)
Magnesium: 2.4 mg/dL (ref 1.7–2.4)

## 2021-11-19 LAB — GLUCOSE, CAPILLARY
Glucose-Capillary: 150 mg/dL — ABNORMAL HIGH (ref 70–99)
Glucose-Capillary: 168 mg/dL — ABNORMAL HIGH (ref 70–99)
Glucose-Capillary: 188 mg/dL — ABNORMAL HIGH (ref 70–99)
Glucose-Capillary: 197 mg/dL — ABNORMAL HIGH (ref 70–99)
Glucose-Capillary: 222 mg/dL — ABNORMAL HIGH (ref 70–99)
Glucose-Capillary: 246 mg/dL — ABNORMAL HIGH (ref 70–99)

## 2021-11-19 LAB — PHOSPHORUS: Phosphorus: 1.9 mg/dL — ABNORMAL LOW (ref 2.5–4.6)

## 2021-11-19 IMAGING — CT CT HEAD W/O CM
4 series · 15 of 47 positions shown, 17 images · non-contrast
Comparison: Postoperative head CT [DATE] and earlier.

CLINICAL DATA: 80-year-old male with intracranial hemorrhage status
post fall. Postoperative day 2 status post left frontotemporal
parietal craniotomy for evacuation of left subdural hematoma.



[Series 2: head wo · axial · 0.47mm/px · z∈[-131,+19]mm · 7 of 40 slices shown, 9 images]
[im 5/40  brain]
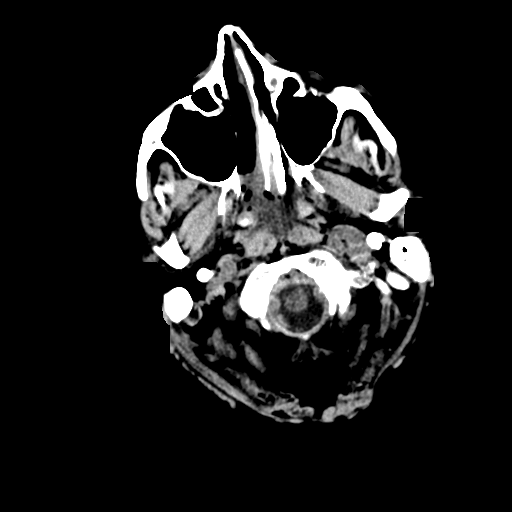
[im 5/40  bone]
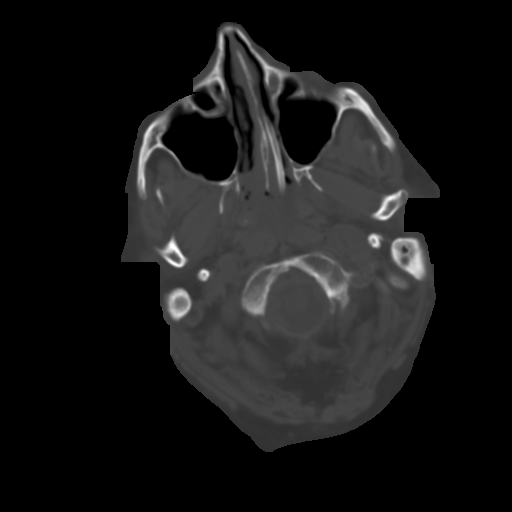
[im 10/40  brain]
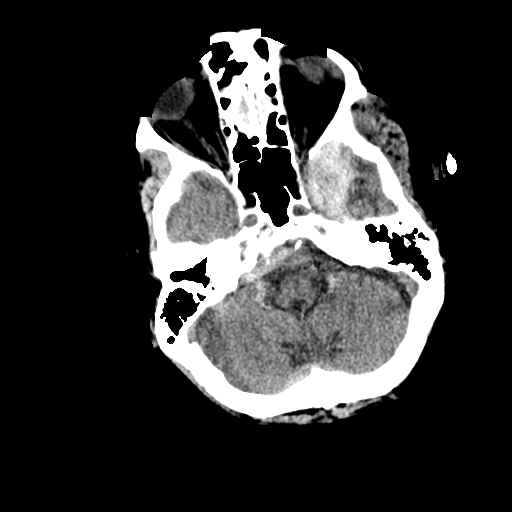
[im 15/40  brain]
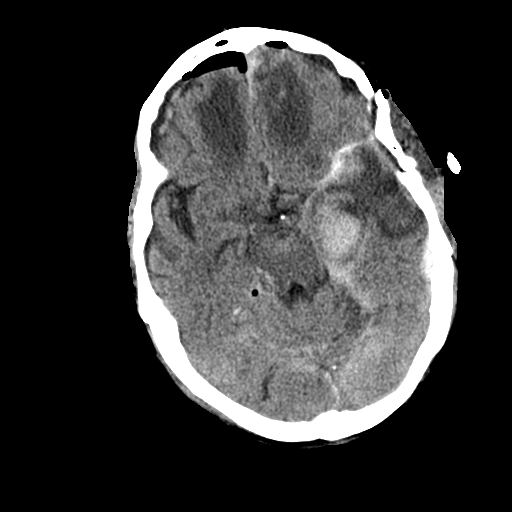
[im 20/40  brain]
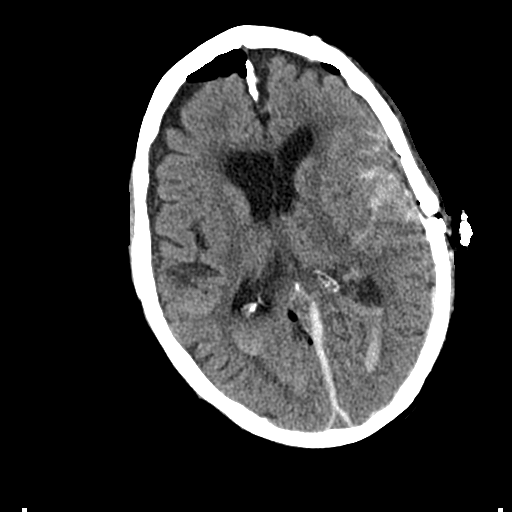
[im 25/40  brain]
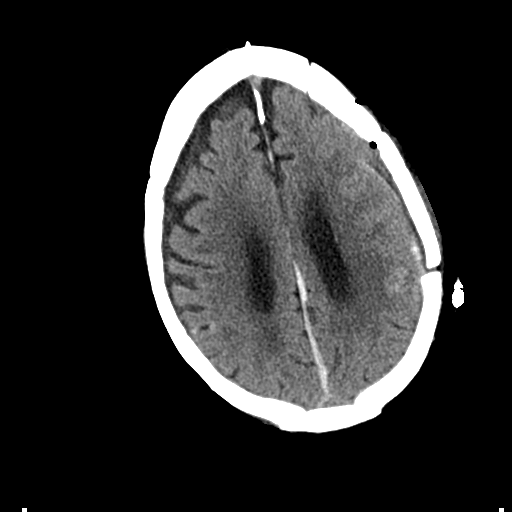
[im 25/40  bone]
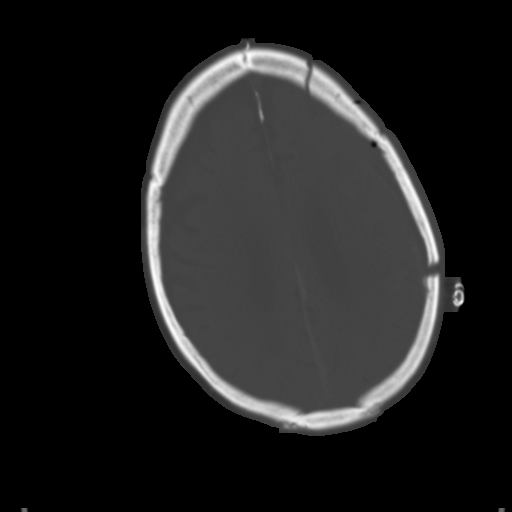
[im 30/40  brain]
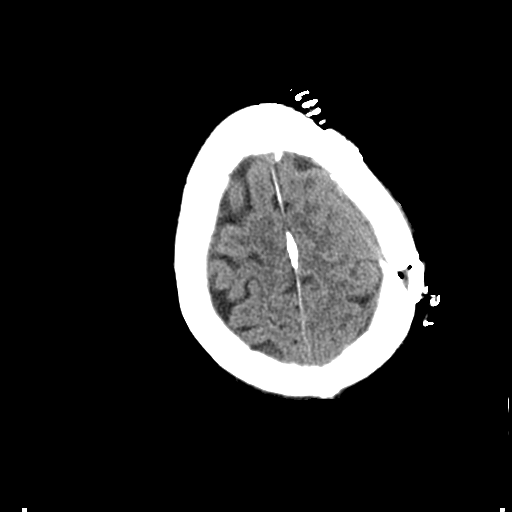
[im 35/40  brain]
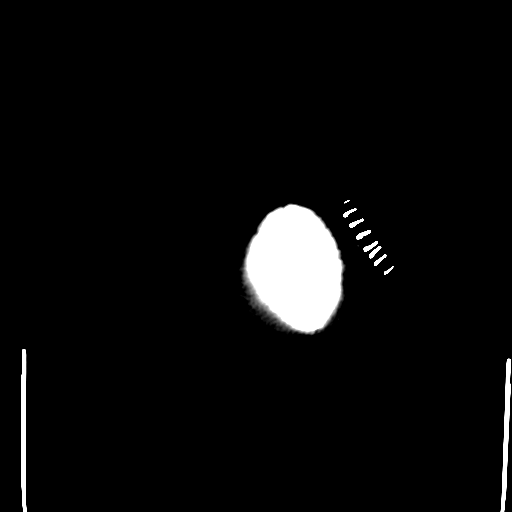

[Series 3: head bone · axial · 0.47mm/px · z∈[-133,-113]mm · 2 of 99 slices shown]
[im 10/99  bone]
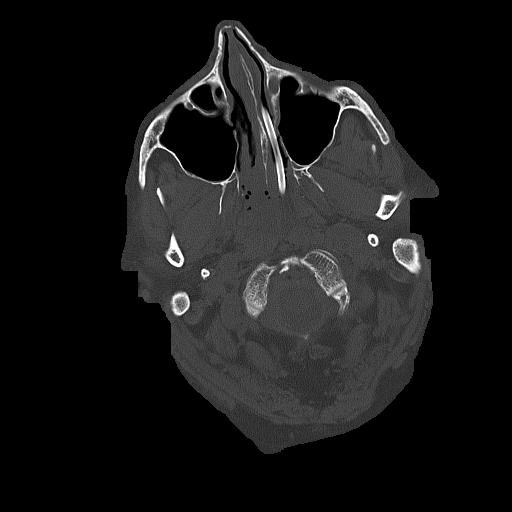
[im 20/99  bone]
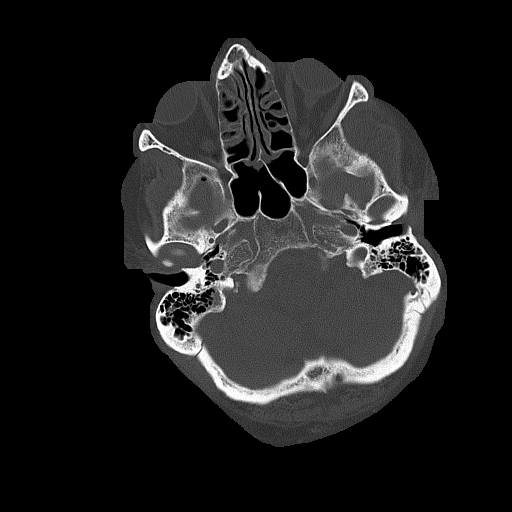

[Series 4: coronal soft tissue · coronal · 0.36mm/px · 3 of 75 slices shown]
[im 25/75  brain]
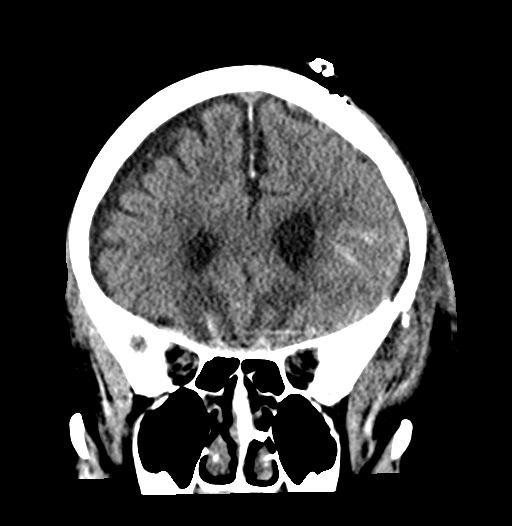
[im 33/75  brain]
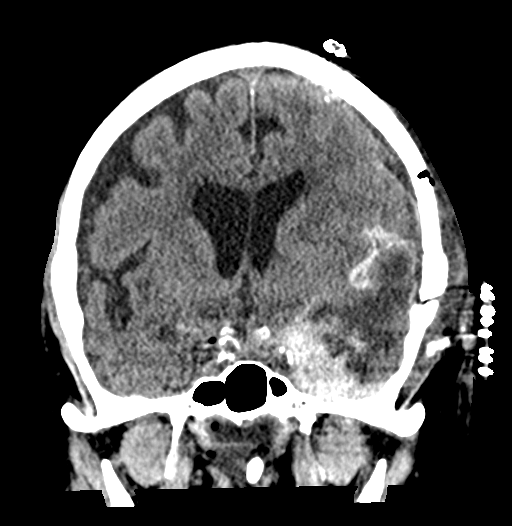
[im 42/75  brain]
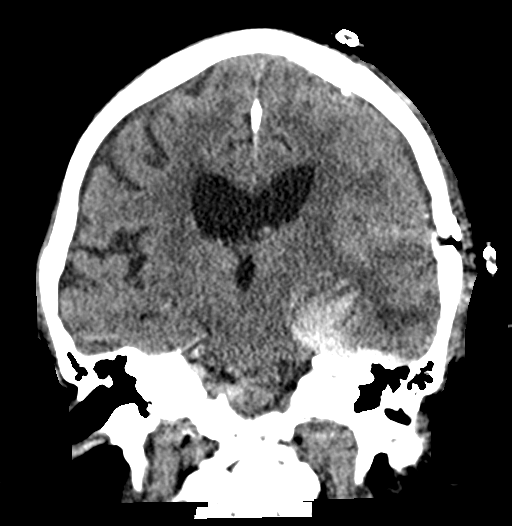

[Series 5: sagittal soft tissue · sagittal · 0.37mm/px · 3 of 61 slices shown]
[im 21/61  brain]
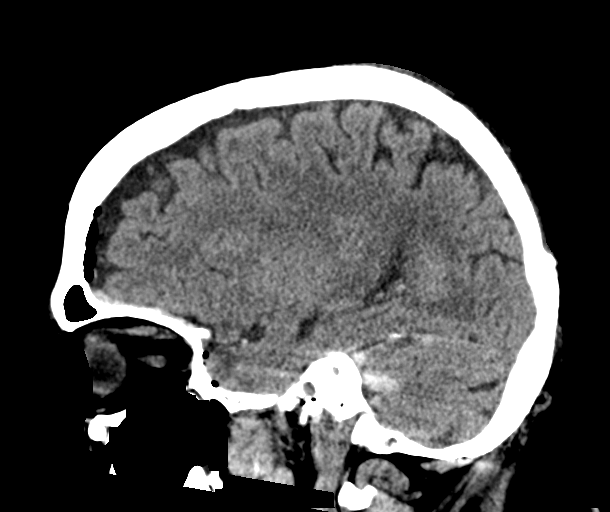
[im 31/61  brain]
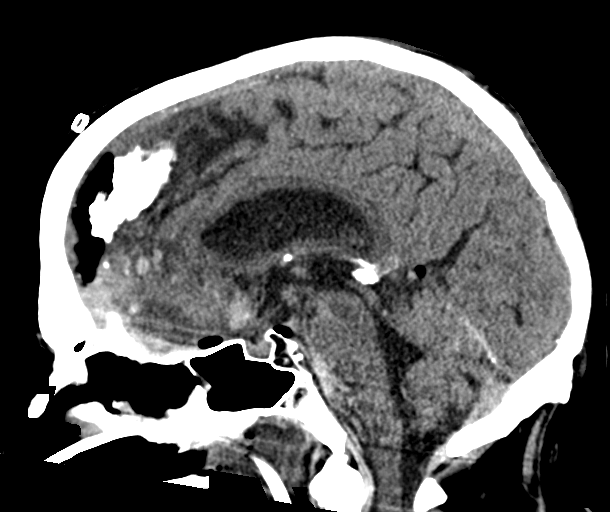
[im 41/61  brain]
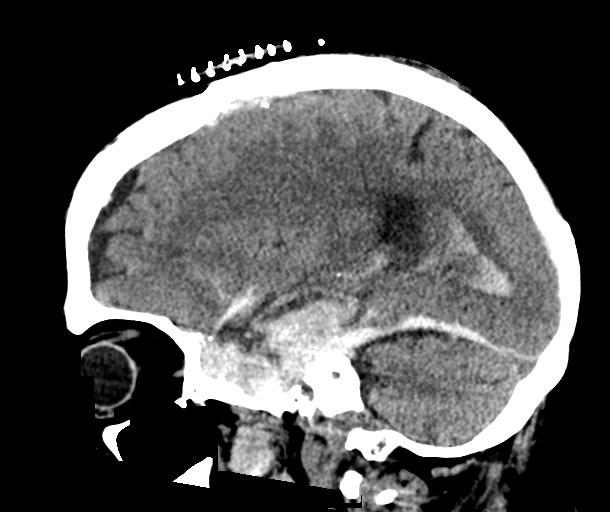

[15 of 47 positions shown; findings below may reference images not displayed]

FINDINGS: Brain: Postoperative pneumocephalus has decreased since [DATE]
but not resolved. Small low-density right side extra-axial
collection superimposed and stable, 4-5 mm (coronal image 31). Mixed
density residual left side subdural hematoma and/or dural repair
underlying the craniotomy is generally 4 mm or less. However, bulky
probable combined extra-axial and parenchymal hemorrhagic contusion
in the left middle cranial fossa remains prominent up to 2 cm in
thickness on series 2, image 38. And there is pronounced edema
throughout the left temporal lobe which also appears increased on
that image (series 4, image 38). More uniform hyperdense subdural
hematoma along the posterior floor of the middle cranial fossa and
layering on the tentorium is stable ranging from 6-7 mm in thickness
(coronal image 46).

Bilateral anterior inferior frontal gyri hemorrhagic contusions have
not significantly changed, with associated parenchymal edema on
series 4, image 18.

Scattered subarachnoid hemorrhage including in the right posterior
fossa has not significantly changed.

A small to moderate volume of intraventricular hemorrhage has
increased (series 2, image 21), but ventricle size and configuration
is stable.

Increasing posttraumatic appearing brain edema in the left temporal
lobe and bilateral inferior frontal gyri (series 2, image 14), but
otherwise stable gray-white matter differentiation. No definite
acute cortically based infarct.

Basilar cisterns have not significantly changed. Overall, mild
rightward midline shift of 4-5 mm is only slightly increased.

Vascular: Calcified atherosclerosis at the skull base. No suspicious
intracranial vascular hyperdensity.

Skull: Stable craniotomy. No new osseous abnormality.

Sinuses/Orbits: Left nasoenteric tube now in place. Mild to moderate
ethmoid mucosal thickening and opacification has not significantly
changed. Tympanic cavities and mastoids remain clear.

Other: Postoperative changes to the scalp. Scalp drain has been
removed. Broad-based residual hematoma at the vertex. Orbits soft
tissues appears stable and negative.
IMPRESSION: 1. Multifocal intracranial hemorrhage following trauma and surgical
evacuation of left side subdural has not significantly changed since
[DATE].

[DATE]. Conspicuous cerebral edema throughout the left temporal lobe and
in both inferior frontal gyri is favored to be posttraumatic, and is
at least in part due to hemorrhagic cerebral contusions.

3. Mild rightward midline shift of 4-5 mm has slightly increased,
but there is no other progressive intracranial mass effect.

4. A moderate volume of intraventricular hemorrhage has increased,
but ventricle size and configuration remains stable.

## 2021-11-19 IMAGING — MR MR HEAD W/O CM
13 of 14 series · 47 of 48 positions shown · non-contrast
Comparison: Head CT same day

CLINICAL DATA: Postop craniotomy for subdural hemorrhage.
Unresponsive. Head trauma, moderate to severe.

EXAM:
MRI HEAD WITHOUT CONTRAST
TECHNIQUE: Multiplanar, multiecho pulse sequences of the brain and surrounding
structures were obtained without intravenous contrast.

[Series 7: ax dwi_tracew · axial · 3.0mm · 1.80mm/px · z∈[+25,+173]mm · 2 of 48 slices shown]
[im 1/48]
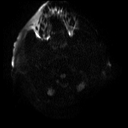
[im 48/48]
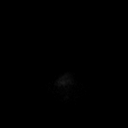

[Series 8: ax dwi_adc · axial · 3.0mm · 1.80mm/px · z∈[+25,+173]mm · 2 of 46 slices shown]
[im 1/46]
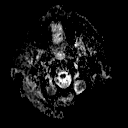
[im 46/46]
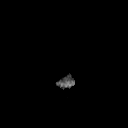

[Series 9: cor dwi_tracew · coronal · 5.0mm · 1.80mm/px · 3 of 38 slices shown]
[im 1/38]
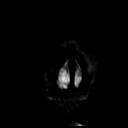
[im 19/38]
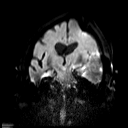
[im 38/38]
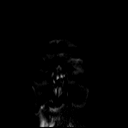

[Series 10: cor dwi_adc · coronal · 5.0mm · 1.80mm/px · 3 of 38 slices shown]
[im 1/38]
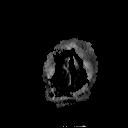
[im 19/38]
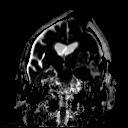
[im 38/38]
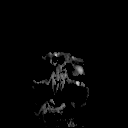

[Series 11: T1 · sagittal · 5.0mm · 0.62mm/px · 2 of 23 slices shown (1 of 2)]
[im 1/23]
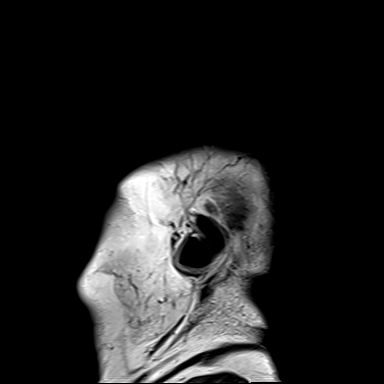
[im 23/23]
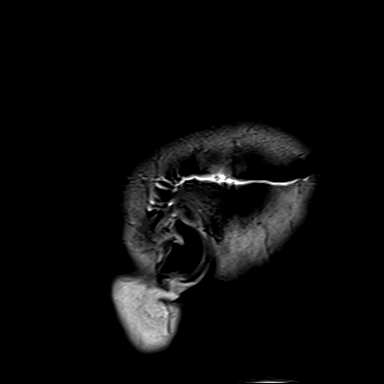

[Series 12: T2 · axial · 5.0mm · 0.86mm/px · z∈[+34,+169]mm · 2 of 25 slices shown (1 of 2)]
[im 1/25]
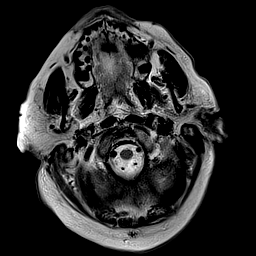
[im 25/25]
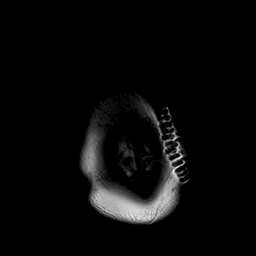

[Series 13: mag_images · axial · 3.0mm · 0.90mm/px · z∈[+28,+171]mm · 4 of 52 slices shown]
[im 1/52]
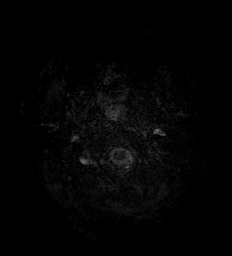
[im 18/52]
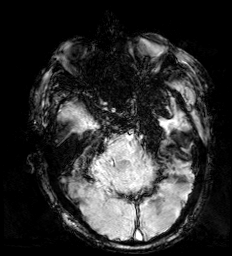
[im 35/52]
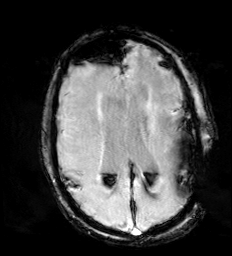
[im 52/52]
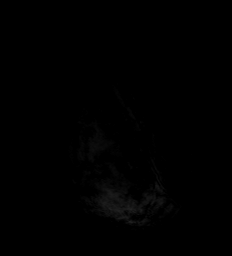

[Series 14: pha_images · axial · 3.0mm · 0.90mm/px · z∈[+28,+171]mm · 4 of 52 slices shown]
[im 1/52]
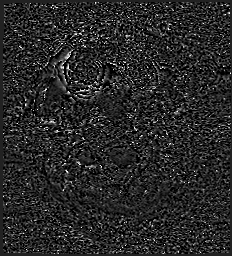
[im 18/52]
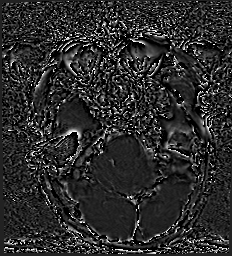
[im 35/52]
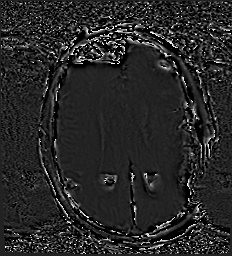
[im 52/52]
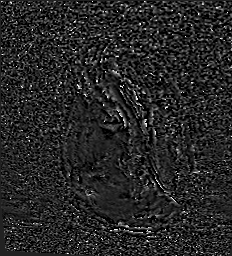

[Series 15: swi_images · axial · 3.0mm · 0.90mm/px · z∈[+28,+171]mm · 4 of 52 slices shown]
[im 1/52]
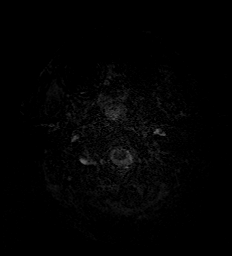
[im 18/52]
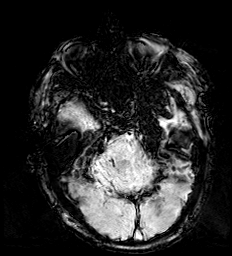
[im 35/52]
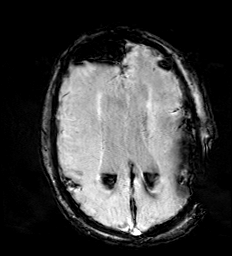
[im 52/52]
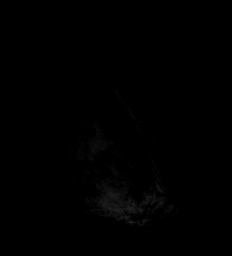

[Series 16: mip_images(sw) · axial · 24.0mm · 0.90mm/px · z∈[+38,+162]mm · 3 of 45 slices shown]
[im 1/45]
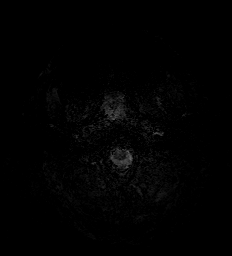
[im 23/45]
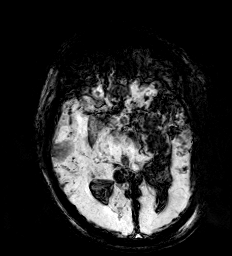
[im 45/45]
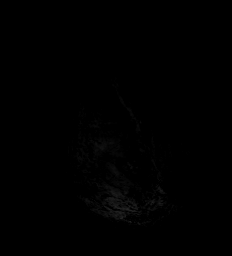

[Series 17: FLAIR · axial · 3.0mm · 0.69mm/px · z∈[+22,+174]mm · 4 of 55 slices shown]
[im 1/55]
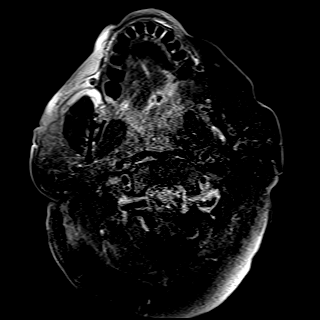
[im 19/55]
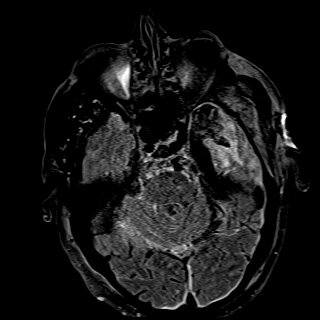
[im 37/55]
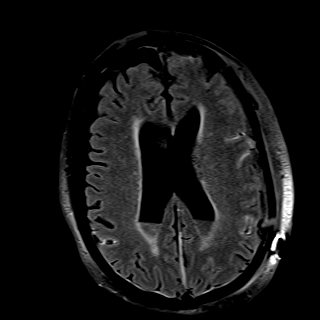
[im 55/55]
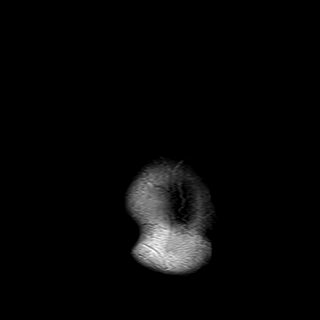

[Series 18: T1 · axial · 1.0mm · 0.98mm/px · z∈[+20,+187]mm · 12 of 176 slices shown (2 of 2)]
[im 1/176]
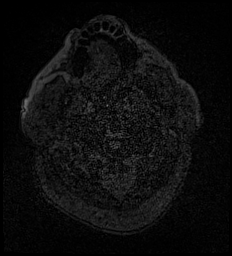
[im 16/176]
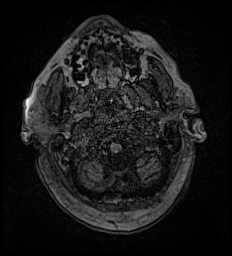
[im 32/176]
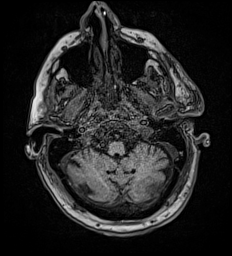
[im 48/176]
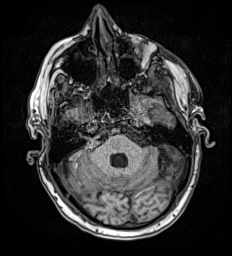
[im 64/176]
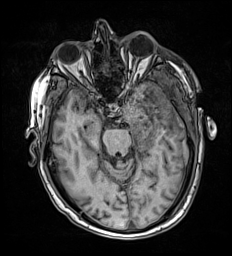
[im 80/176]
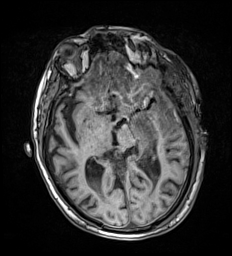
[im 96/176]
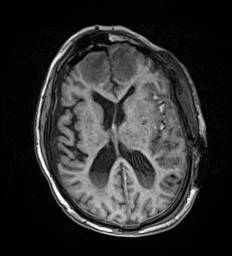
[im 112/176]
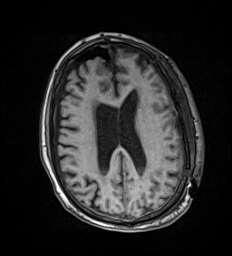
[im 128/176]
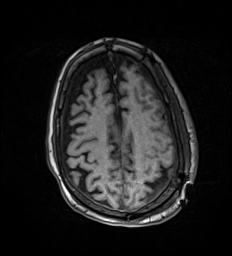
[im 144/176]
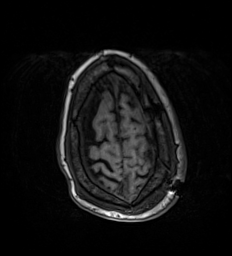
[im 160/176]
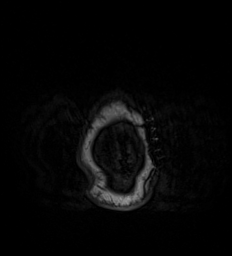
[im 176/176]
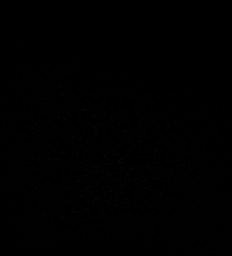

[Series 19: T2 · coronal · 5.0mm · 0.86mm/px · 2 of 29 slices shown (2 of 2)]
[im 1/29]
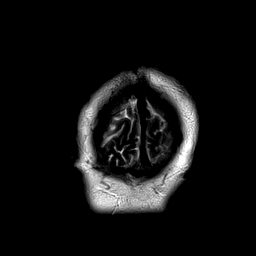
[im 29/29]
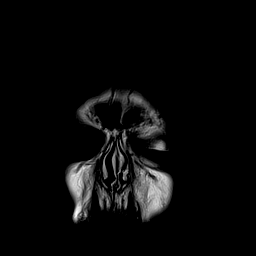

[47 of 48 positions shown; findings below may reference images not displayed]

FINDINGS: Brain: Diffusion imaging does not show any ischemic brain
infarction. Edema and blood products related to hemorrhagic
contusion again seen affecting both frontal lobes and the anterior
left temporal lobe. Widespread subarachnoid hemorrhage as seen
previously. Subdural blood as seen previously along the medial
aspect of the middle cranial fossa on the left, thickness
approximately 13 mm, and along the posterior falx and left
tentorium. Layering blood in the dependent lateral ventricles as
seen previously. No change in ventricular size.

Vascular: Major vessels at the base of the brain show flow.

Skull and upper cervical spine: Craniotomy changes on the left.

Sinuses/Orbits: No significant paranasal sinus finding. Orbits
negative.

Other: None
IMPRESSION: No ischemic infarction identified. Comparing the MRI to the most
recent CT of earlier today, no change is appreciated. Sequela of
hemorrhagic contusion of both frontal lobes and in the anterior left
temporal lobe. Widespread subarachnoid blood and dependent
intraventricular blood with stable ventricular size. Stable subdural
blood along the medial aspect of the middle cranial fossa on the
left, along the falx posteriorly and along the tentorium.

## 2021-11-19 MED ORDER — INSULIN ASPART 100 UNIT/ML IJ SOLN
3.0000 [IU] | INTRAMUSCULAR | Status: DC
  Administered 2021-11-19: 9 [IU] via SUBCUTANEOUS
  Administered 2021-11-20: 6 [IU] via SUBCUTANEOUS
  Administered 2021-11-20: 3 [IU] via SUBCUTANEOUS
  Administered 2021-11-20 – 2021-11-21 (×6): 6 [IU] via SUBCUTANEOUS
  Administered 2021-11-21: 3 [IU] via SUBCUTANEOUS
  Administered 2021-11-21: 6 [IU] via SUBCUTANEOUS
  Administered 2021-11-21: 3 [IU] via SUBCUTANEOUS
  Administered 2021-11-22 (×3): 6 [IU] via SUBCUTANEOUS
  Administered 2021-11-22: 3 [IU] via SUBCUTANEOUS
  Administered 2021-11-22: 9 [IU] via SUBCUTANEOUS
  Administered 2021-11-22 – 2021-11-23 (×2): 6 [IU] via SUBCUTANEOUS
  Administered 2021-11-23: 3 [IU] via SUBCUTANEOUS
  Administered 2021-11-23: 6 [IU] via SUBCUTANEOUS
  Administered 2021-11-23: 3 [IU] via SUBCUTANEOUS
  Administered 2021-11-23: 6 [IU] via SUBCUTANEOUS
  Filled 2021-11-19 (×22): qty 1

## 2021-11-19 MED ORDER — INSULIN ASPART 100 UNIT/ML IJ SOLN: 2.0000 [IU] | INTRAMUSCULAR | Status: DC

## 2021-11-19 MED ORDER — POTASSIUM PHOSPHATES 15 MMOLE/5ML IV SOLN
20.0000 mmol | Freq: Once | INTRAVENOUS | Status: AC
  Administered 2021-11-19: 20 mmol via INTRAVENOUS
  Filled 2021-11-19: qty 6.67

## 2021-11-19 NOTE — Progress Notes (Addendum)
SLP Cancellation Note ? ?Patient Details ?Name: Michael Cohen ?MRN: [id number] ?DOB: [birth year] ? ? ?Cancelled treatment:       Reason Eval/Treat Not Completed: Patient not medically ready;Medical issues which prohibited therapy (chart reviewed) ?Pt remains intubated; sedated. Min. responses. Weaning on sedation per MD.  ?ST services will monitor pt's status next 1-3 days for appropriateness for assessment. ? ? ? ?Orinda Kenner, MS, CCC-SLP ?Speech Language Pathologist ?Re[facility]; [place] ?[phone] (ascom) ?Sufyan Meidinger ?11/19/2021, 12:56 PM ?

## 2021-11-19 NOTE — Progress Notes (Signed)
RT assisted with patient transport to CT and back to ICU. No complications during transport. ?

## 2021-11-19 NOTE — IPAL (Addendum)
?  Interdisciplinary Goals of Care Family Meeting ? ? ?Date carried out: 11/19/2021 ? ? ? ?GOALS OF CARE DISCUSSION ? ?The Clinical status was relayed to family in [place] and Wife at bedside ? ?Updated and notified of patients medical condition- ?Patient remains unresponsive and will not open eyes to command.   ?Patient is having a weak cough and struggling to remove secretions.   ?Patient with increased WOB and using accessory muscles to breathe ?Explained to family course of therapy and the modalities  ? ?Patient with Progressive multiorgan failure with a very high probablity of a very minimal chance of meaningful recovery despite all aggressive and optimal medical therapy.  ? ? ?Family understands the situation. ? ?They have consented and agreed to DNR status ?Patient has signs and symptoms of brain damage ?MRI brain pending ? ?Family are satisfied with Plan of action and management. All questions answered ? ?Additional CC time 35 mins ? ? ?Corrin Parker, M.D.  ?[facility]  ?Medical Director [place] ?Medical Director [facility] Cardio-Pulmonary Department  ? ? ?

## 2021-11-19 NOTE — Progress Notes (Signed)
PHARMACY CONSULT NOTE  ? ?Pharmacy Consult for Electrolyte Monitoring and Replacement  ? ?Recent Labs: ?Potassium (mmol/L)  ?Date Value  ?11/19/2021 3.7  ? ?Magnesium (mg/dL)  ?Date Value  ?11/19/2021 2.4  ? ?Calcium (mg/dL)  ?Date Value  ?11/19/2021 7.6 (L)  ? ?Albumin (g/dL)  ?Date Value  ?11/15/2021 3.7  ? ?Phosphorus (mg/dL)  ?Date Value  ?11/19/2021 1.9 (L)  ? ?Sodium (mmol/L)  ?Date Value  ?11/19/2021 140  ? ?Corrected Ca: 7.8 mg/dL ? ?Assessment: ?81 year old male with PMH of HTN, T2DM, ETOH use, HLD admitted with subarachnoid hemorrhage. S/p craniotomy. ? ?Nutrition: Vital HP '@65ml'$ /hr- Initiate at 54m/hr and increase by 158mhr q 8 hours until goal rate is reached.  ? ----Free water flushes 3013m4 hours to maintain tube patency  ? ?Goal of Therapy:  ?Potassium 4.0 - 5.1 mmol/L ?Magnesium 2.0 - 2.4 mg/dL ?All other electrolytes within normal limits ? ?Plan:  ?20 mmol IV potassium phosphate x 1 (contains 29 mEq IV potassium) ?Follow up labs in AM ? ? ?RodDallie PilesharmD ?11/19/2021 ?11:14 AM ?

## 2021-11-19 NOTE — Progress Notes (Signed)
PT Cancellation Note ? ?Patient Details ?Name: Michael Cohen ?MRN: [id number] ?DOB: Mar 23, 1941 ? ? ?Cancelled Treatment:    Reason Eval/Treat Not Completed: Patient not medically ready PT orders received, chart reviewed. Pt noted to be critically ill & on a ventilator. After speaking with team, will d/c current PT orders & await new ones once pt is medically appropriate. ? ?Lavone Nian, PT, DPT ?11/19/21, 7:51 AM ? ? ?Waunita Schooner ?11/19/2021, 7:48 AM ?

## 2021-11-19 NOTE — Progress Notes (Signed)
OT Cancellation Note ? ?Patient Details ?Name: Michael Cohen ?MRN: [id number] ?DOB: 07-Sep-1940 ? ? ?Cancelled Treatment:    Reason Eval/Treat Not Completed: Patient not medically ready;Other (comment) (Chart reviewed, pt noted to be critically ill and intubated/sedated. Will d/c current OT orders and await new ones once patient is medically ready to participate in therapy.) ? ?Shanon Payor, OTD OTR/L  ?11/19/21, 8:03 AM  ?

## 2021-11-19 NOTE — Plan of Care (Signed)
?  Problem: Education: ?Goal: Knowledge of disease or condition will improve ?Outcome: Not Applicable ?Goal: Knowledge of secondary prevention will improve (SELECT ALL) ?Outcome: Not Applicable ?Goal: Knowledge of patient specific risk factors will improve (INDIVIDUALIZE FOR PATIENT) ?Outcome: Not Applicable ?  ?

## 2021-11-19 NOTE — Progress Notes (Signed)
? ?NAME:  Michael Cohen, MRN:  [id number], DOB:  19-Aug-1940, LOS: 2 ?ADMISSION DATE:  11/25/2021, CONSULTATION DATE:  11/26/2021 ?REFERRING MD:  Dr. Sidney Ace, CHIEF COMPLAINT: Fall   ? ?History of Present Illness:  ?81 year old male presenting to [facility] ED from [facility] on 12/07/2021 via EMS.  Per the patient's wife, he had gone out to walk the dog that evening and had fallen in the entryway hitting his head on the corner of the baseboard.  She reported he was altered post fall with complaints of a headache. Patient is not on systemic blood thinners but does take a baby aspirin every other day for heart health.  Wife confirms that the patient drinks 2 scotches daily and sometimes wine with dinner, unclear the exact amount of scotch.  She denies any other recent complaints.  No reports of witnessed seizure-like activity and the patient did confirm that he had drinking alcohol that evening. ?ED course: ?Head CT showed large subarachnoid hemorrhage.  Follow-up CTA revealed no aneurysm.  EDP spoke with Dr. Cari Caraway with neurosurgery who initially recommended admitting to hospitalist service with repeat CT in 6 hours.  TRH admitted patient to stepdown unit.  However, patient still boarding in ED when he became agitated attempting to get out of bed and then vomited.  At this point EDP described patient as having gurgling respirations with SPO2 83% on room air.  Patient was emergently intubated requiring mechanical ventilatory support.  Stat repeat head CT revealed new subdural bleed with 7 mm midline shift.  Dr. Cari Caraway from neurosurgery made aware of change, discussed options bedside with wife and the decision was made to take the patient urgently to the OR and transferred directly to ICU. ?medications given: Keppra 1000 mg, mannitol 50 g, IV contrast, fentanyl, 1 L NS, Tdap, banana bag, etomidate, succinylcholine, propofol drip started ?Initial Vitals: 97.3 F, 17, 62, 117/67 and SPO2 95% on room air ?Significant labs:  (Labs/ Imaging personally reviewed) ?I, Domingo Pulse Rust-Chester, AGACNP-BC, personally viewed and interpreted this ECG. ?EKG Interpretation: Date: 11/15/2021, EKG Time: 23: 55, Rate: 63, Rhythm: First-degree heart block, QRS Axis: Normal,  ?Intervals: First-degree heart block, borderline prolonged QTc, ST/T Wave abnormalities: None, Narrative Interpretation: NSR with first-degree heart block ?Chemistry: Na+:139, K+: 3.3, BUN/Cr.: 8/0.67, Serum CO2/ AG: 24/13 ?Hematology: WBC: 8.2, Hgb: 12.5,  ?Troponin: 6 >5, COVID-19 & Influenza A/B: pending ?ABG: pending ?CXR 11/15/2021: Low lung volumes with probable areas of passive subsegmental atelectasis, as above ?CT head Wo contrast 11/24/2021: Diffuse subarachnoid hemorrhage as described above concentrated predominately in the left sylvian fissure. 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 CTA of the head is ?recommended for further evaluation. Chronic atrophic and ischemic changes are noted ?CT cervical spine 12/07/2021: Degenerative change without acute abnormality ?CT angio head 11/15/2021: No aneurysm, occlusion or high-grade stenosis of the intracranial arteries ?CT head wo contrast 11/18/2021: New subdural hematoma along the majority of the left cerebral convexity measuring up to 1 cm in thickness and primarily responsible for new 7 mm midline shift. Some thickening of subarachnoid hemorrhage although stable pattern. Trace subdural hematoma along the anterior right frontal convexity. Early visualization of cerebral contusion in the bilateral inferior frontal and left temporal lobes. ? ?PCCM consulted due to deterioration requiring emergent intubation and mechanical ventilatory support. ? 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 ?Significant Hospital Events: ?Including procedures, antibiotic start and stop dates in addition to other pertinent events   ?  11/15/2021: Admit to ICU with  worsening SAH after emergent intubation on mechanical ventilatory support with plans to go urgently to OR for neurosurgery ?5/5 s/p craniotomy for TRAUMATIC SAH/subdural hematoma, remains on vent ?5/5 remains on  vent, family updated ?5/6 plan for WUA ? ?Interim History / Subjective:  ?Remains on vent ?Off pressors ?+brain damage ?+ETOH abuse high risk for DT's ?Follow up Neuro recs ? ?Vent Mode: PRVC ?FiO2 (%):  [35 %] 35 % ?Set Rate:  [12 bmp-16 bmp] 12 bmp ?Vt Set:  [500 mL] 500 mL ?PEEP:  [5 cmH20] 5 cmH20 ?Pressure Support:  [10 cmH20] 10 cmH20 ?Plateau Pressure:  [12 cmH20-17 cmH20] 12 cmH20 ? ? ? ?Objective   ?Blood pressure 115/62, pulse 72, temperature 100.2 ?F (37.9 ?C), resp. rate 12, height 5' 10"  (1.778 m), weight 86.2 kg, SpO2 98 %. ?   ?Vent Mode: PRVC ?FiO2 (%):  [35 %] 35 % ?Set Rate:  [12 bmp-16 bmp] 12 bmp ?Vt Set:  [500 mL] 500 mL ?PEEP:  [5 cmH20] 5 cmH20 ?Pressure Support:  [10 cmH20] 10 cmH20 ?Plateau Pressure:  [12 cmH20-17 cmH20] 12 cmH20  ? ?Intake/Output Summary (Last 24 hours) at 11/19/2021 0953 ?Last data filed at 11/19/2021 0800 ?Gross per 24 hour  ?Intake 1522.35 ml  ?Output 865 ml  ?Net 657.35 ml  ? ? ?Filed Weights  ? 12/01/2021 0143  ?Weight: 86.2 kg  ? ? ?REVIEW OF SYSTEMS ? ?PATIENT IS UNABLE TO PROVIDE COMPLETE REVIEW OF SYSTEMS DUE TO SEVERE CRITICAL ILLNESS AND TOXIC METABOLIC ENCEPHALOPATHY ? ? ? ?PHYSICAL EXAMINATION: ? ?GENERAL:critically ill appearing, +resp distress ?BANDAGES RT SIDE OF HEAD, JP DRAIN IN PLACE ?EYES: Pupils equal, round, reactive to light.  No scleral icterus.  ?MOUTH: Moist mucosal membrane. INTUBATED ?NECK: Supple.  ?PULMONARY: +rhonchi, +wheezing ?CARDIOVASCULAR: S1 and S2.  No murmurs  ?GASTROINTESTINAL: Soft, nontender, -distended. Positive bowel sounds.  ?MUSCULOSKELETAL: No swelling, clubbing, or edema.  ?NEUROLOGIC: obtunded ?SKIN:intact,warm,dry ? ? ? ?Assessment & Plan:  ?Acute Hypoxic / Hypercapnic Respiratory Failure secondary to aspiration in the  setting of Traumatic SAH/Subdural Hematoma s/p craniotomy ?Risk for Aspiration Pneumonia ? ?Severe ACUTE Hypoxic and Hypercapnic Respiratory Failure ?-continue Mechanical Ventilator support ?-Wean Fio2 and PEEP as tolerated ?-VAP/VENT bundle implementation ?- Wean PEEP & FiO2 as tolerated, maintain SpO2 > 88% ?- Head of bed elevated 30 degrees, VAP protocol in place ?- Plateau pressures less than 30 cm H20  ?- Intermittent chest x-ray & ABG PRN ?- Ensure adequate pulmonary hygiene  ?-will perform SAT/SBT when respiratory parameters are met ? ? ?NEUROLOGY ?ACUTE TOXIC METABOLIC ENCEPHALOPATHY ?Traumatic SAH secondary to mechanical fall PTA ?Repeat CTH shows newly developed with new 34m midline shift ?Dr. YCari Carawaybedside discussing options with spouse, overall poor prognosis. Decision made to take patient to OR for urgent intervention. ?WUA pending, family at bedside  ?High risk for DT's ? ?ELECTROLYTES ?-follow labs as needed ?-replace as needed ?-pharmacy consultation and following ? ?SEVERE ALCOHOL WITHDRAWAL ?-Therapy with Thiamine and MVI ?-CIWA Protocol ? ? ?ENDO ?- ICU hypoglycemic\Hyperglycemia protocol ?-check FSBS per protocol ? ? ?GI ?GI PROPHYLAXIS as indicated ? ?NUTRITIONAL STATUS ?DIET-->TF's as tolerated ?Constipation protocol as indicated ? ? ?ELECTROLYTES ?-follow labs as needed ?-replace as needed ?-pharmacy consultation and following ? ?Best Practice (right click and "Reselect all SmartList Selections" daily)  ?Diet/type: NPO w/ meds via tube ?DVT prophylaxis: SCD ?GI prophylaxis: PPI ?Lines: N/A ?Foley:  N/A ?Code Status:  full code ? ? ?Labs   ?CBC: ?Recent Labs  ?Lab  12/04/2021 ?0003 11/19/21 ?3300  ?WBC 8.2 10.8*  ?HGB 12.5* 9.2*  ?HCT 40.5 30.7*  ?MCV 82.5 83.4  ?PLT 219 152  ? ? ? ?Basic Metabolic Panel: ?Recent Labs  ?Lab 11/19/2021 ?0003 11/18/21 ?0725 11/19/21 ?7622 11/19/21 ?6333  ?NA 139 140 140 140  ?K 3.3* 3.2* 3.5 3.7  ?CL 102 107 110 107  ?CO2 24 24 27 28   ?GLUCOSE 140* 164* 185* 201*   ?BUN 8 10 16 16   ?CREATININE 0.67 0.61 0.52* 0.60*  ?CALCIUM 9.0 7.2* 7.1* 7.6*  ?MG  --  1.5* 2.3 2.4  ?PHOS  --  3.2 1.9*  --   ? ? ?GFR: ?Estimated Creatinine Clearance: 77.3 mL/min (A) (by C-G formula based

## 2021-11-19 NOTE — Progress Notes (Signed)
? ?   Attending Progress Note ? ?History: Karmello Abercrombie is a 81 y.o presenting after a fall presenting to the ER with confusion and headache. Was found to have [place] with left sided SDH. S/p left craniotomy for evacuation on 12/08/2021.  ? ?POD2: Pt remains intubated.  He moves BUE and BLE more briskly on the L.  He is currently heavily sedated. ? ?POD1: Pt remains in the ICU on sedation, pressures, and intubated. Neurologically stable. Incision was bloody drainage yesterday evening. Was reinforced with additional staples without any continued issue.  ? ?Physical Exam: ?Vitals:  ? 11/19/21 0945 11/19/21 1000  ?BP:    ?Pulse: 70 71  ?Resp: 12 12  ?Temp: 100.2 ?F (37.9 ?C) 100.2 ?F (37.9 ?C)  ?SpO2: 99% 99%  ? ?Intubated and sedated.  ?Does not OE. Does not regard or FC. ?Localizes LUE, weak withdrawal RUE. Withdraws in BLE more briskly on L. ? ?Data: ? ?Recent Labs  ?Lab 11/18/21 ?0725 11/19/21 ?0342 11/19/21 ?4193  ?NA 140 140 140  ?K 3.2* 3.5 3.7  ?CL 107 110 107  ?CO2 '24 27 28  '$ ?BUN '10 16 16  '$ ?CREATININE 0.61 0.52* 0.60*  ?GLUCOSE 164* 185* 201*  ?CALCIUM 7.2* 7.1* 7.6*  ? ?Recent Labs  ?Lab 11/26/2021 ?0003  ?AST 23  ?ALT 16  ?ALKPHOS 58  ?  ? Recent Labs  ?Lab 11/24/2021 ?0003 11/19/21 ?7902  ?WBC 8.2 10.8*  ?HGB 12.5* 9.2*  ?HCT 40.5 30.7*  ?PLT 219 152  ? ?Recent Labs  ?Lab 12/14/2021 ?0142  ?INR 1.0  ?  ?   ? ? ?Other tests/results:  ?Head CT 12/06/2021 (post-op) ?IMPRESSION: ?1. Status post left convexity subdural hematoma evacuation with ?resolution of midline shift. ?2. Residual mixed subdural and subarachnoid blood surrounding the ?cerebellum and over the left convexity, but the dominant component ?of the subdural hematoma has markedly decreased. ?  ?  ?Electronically Signed ?  By: Ulyses Jarred M.D. ?  On: 12/11/2021 19:03 ? ?Assessment/Plan: ? ?Braedyn Kauk is a 81 y.o presenting after a fall resulting in tSAH and SDH s/p craniotomy for SDH evacuation on 11/25/2021. ? ?He is not as awake today, but is currently  having sedation held to see if he will improve. If he does not return to prior baseline, he may need a head CT.  If that is stable, may need EEG/MRI. ? ?I will discuss with ICU team. ? ?- Defer to CCU regarding extubation.  ?- Would continue to take sedation breaks and keep sedation and light as possible.  ?- continue Keppra '500mg'$  BID x 7 days ?- SBP<160 ? ?Meade Maw MD ?Department of Neurosurgery ? ?  ?

## 2021-11-20 DIAGNOSIS — I609 Nontraumatic subarachnoid hemorrhage, unspecified: Secondary | ICD-10-CM | POA: Diagnosis not present

## 2021-11-20 LAB — BASIC METABOLIC PANEL
Anion gap: 8 (ref 5–15)
BUN: 19 mg/dL (ref 8–23)
CO2: 28 mmol/L (ref 22–32)
Calcium: 7.7 mg/dL — ABNORMAL LOW (ref 8.9–10.3)
Chloride: 105 mmol/L (ref 98–111)
Creatinine, Ser: 0.51 mg/dL — ABNORMAL LOW (ref 0.61–1.24)
GFR, Estimated: >60 mL/min (ref >60)
Glucose, Bld: 178 mg/dL — ABNORMAL HIGH (ref 70–99)
Potassium: 3.4 mmol/L — ABNORMAL LOW (ref 3.5–5.1)
Sodium: 141 mmol/L (ref 135–145)

## 2021-11-20 LAB — PROCALCITONIN
Procalcitonin: <0.1 ng/mL
Procalcitonin: <0.1 ng/mL

## 2021-11-20 LAB — CBC
HCT: 30.1 % — ABNORMAL LOW (ref 39.0–52.0)
Hemoglobin: 9 g/dL — ABNORMAL LOW (ref 13.0–17.0)
MCH: 24.9 pg — ABNORMAL LOW (ref 26.0–34.0)
MCHC: 29.9 g/dL — ABNORMAL LOW (ref 30.0–36.0)
MCV: 83.1 fL (ref 80.0–100.0)
Platelets: 150 10*3/uL (ref 150–400)
RBC: 3.62 MIL/uL — ABNORMAL LOW (ref 4.22–5.81)
RDW: 17.1 % — ABNORMAL HIGH (ref 11.5–15.5)
WBC: 12.3 10*3/uL — ABNORMAL HIGH (ref 4.0–10.5)
nRBC: 0 % (ref 0.0–0.2)

## 2021-11-20 LAB — GLUCOSE, CAPILLARY
Glucose-Capillary: 139 mg/dL — ABNORMAL HIGH (ref 70–99)
Glucose-Capillary: 154 mg/dL — ABNORMAL HIGH (ref 70–99)
Glucose-Capillary: 156 mg/dL — ABNORMAL HIGH (ref 70–99)
Glucose-Capillary: 167 mg/dL — ABNORMAL HIGH (ref 70–99)
Glucose-Capillary: 180 mg/dL — ABNORMAL HIGH (ref 70–99)
Glucose-Capillary: 192 mg/dL — ABNORMAL HIGH (ref 70–99)

## 2021-11-20 LAB — PHOSPHORUS: Phosphorus: 2.6 mg/dL (ref 2.5–4.6)

## 2021-11-20 LAB — MAGNESIUM: Magnesium: 2.4 mg/dL (ref 1.7–2.4)

## 2021-11-20 MED ORDER — POTASSIUM CHLORIDE 20 MEQ PO PACK
40.0000 meq | PACK | Freq: Once | ORAL | Status: AC
  Administered 2021-11-20: 40 meq
  Filled 2021-11-20: qty 2

## 2021-11-20 MED ORDER — FENTANYL CITRATE PF 50 MCG/ML IJ SOSY
25.0000 ug | PREFILLED_SYRINGE | INTRAMUSCULAR | Status: DC | PRN
  Administered 2021-11-20 – 2021-11-22 (×6): 50 ug via INTRAVENOUS
  Filled 2021-11-20 (×6): qty 1

## 2021-11-20 MED ORDER — PROPOFOL 1000 MG/100ML IV EMUL
5.0000 ug/kg/min | INTRAVENOUS | Status: DC
  Administered 2021-11-21: 10 ug/kg/min via INTRAVENOUS
  Filled 2021-11-20: qty 100

## 2021-11-20 MED ORDER — PROPOFOL 1000 MG/100ML IV EMUL
INTRAVENOUS | Status: AC
  Administered 2021-11-20: 5 ug/kg/min via INTRAVENOUS
  Filled 2021-11-20: qty 100

## 2021-11-20 NOTE — Progress Notes (Signed)
? ?NAME:  Michael Cohen, MRN:  [id number], DOB:  April 26, 1941, LOS: 3 ?ADMISSION DATE:  11/21/2021, CONSULTATION DATE:  11/15/2021 ?REFERRING MD:  Dr. Sidney Ace, CHIEF COMPLAINT: Fall   ? ?History of Present Illness:  ?81 year old male presenting to [facility] ED from [facility] on 11/20/2021 via EMS.  Per the patient's wife, he had gone out to walk the dog that evening and had fallen in the entryway hitting his head on the corner of the baseboard.  She reported he was altered post fall with complaints of a headache. Patient is not on systemic blood thinners but does take a baby aspirin every other day for heart health.  Wife confirms that the patient drinks 2 scotches daily and sometimes wine with dinner, unclear the exact amount of scotch.  She denies any other recent complaints.  No reports of witnessed seizure-like activity and the patient did confirm that he had drinking alcohol that evening. ?ED course: ?Head CT showed large subarachnoid hemorrhage.  Follow-up CTA revealed no aneurysm.  EDP spoke with Dr. Cari Caraway with neurosurgery who initially recommended admitting to hospitalist service with repeat CT in 6 hours.  TRH admitted patient to stepdown unit.  However, patient still boarding in ED when he became agitated attempting to get out of bed and then vomited.  At this point EDP described patient as having gurgling respirations with SPO2 83% on room air.  Patient was emergently intubated requiring mechanical ventilatory support.  Stat repeat head CT revealed new subdural bleed with 7 mm midline shift.  Dr. Cari Caraway from neurosurgery made aware of change, discussed options bedside with wife and the decision was made to take the patient urgently to the OR and transferred directly to ICU. ?medications given: Keppra 1000 mg, mannitol 50 g, IV contrast, fentanyl, 1 L NS, Tdap, banana bag, etomidate, succinylcholine, propofol drip started ?Initial Vitals: 97.3 F, 17, 62, 117/67 and SPO2 95% on room air ?Significant labs:  (Labs/ Imaging personally reviewed) ?I, Domingo Pulse Rust-[place]ster, AGACNP-BC, personally viewed and interpreted this ECG. ?EKG Interpretation: Date: 11/22/2021, EKG Time: 23: 55, Rate: 63, Rhythm: First-degree heart block, QRS Axis: Normal,  ?Intervals: First-degree heart block, borderline prolonged QTc, ST/T Wave abnormalities: None, Narrative Interpretation: NSR with first-degree heart block ?Chemistry: Na+:139, K+: 3.3, BUN/Cr.: 8/0.67, Serum CO2/ AG: 24/13 ?Hematology: WBC: 8.2, Hgb: 12.5,  ?Troponin: 6 >5, COVID-19 & Influenza A/B: pending ?ABG: pending ?CXR 12/01/2021: Low lung volumes with probable areas of passive subsegmental atelectasis, as above ?CT head Wo contrast 11/15/2021: Diffuse subarachnoid hemorrhage as described above concentrated predominately in the left sylvian fissure. 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 CTA of the head is ?recommended for further evaluation. Chronic atrophic and ischemic changes are noted ?CT cervical spine 12/07/2021: Degenerative change without acute abnormality ?CT angio head 12/02/2021: No aneurysm, occlusion or high-grade stenosis of the intracranial arteries ?CT head wo contrast 12/03/2021: New subdural hematoma along the majority of the left cerebral convexity measuring up to 1 cm in thickness and primarily responsible for new 7 mm midline shift. Some thickening of subarachnoid hemorrhage although stable pattern. Trace subdural hematoma along the anterior right frontal convexity. Early visualization of cerebral contusion in the bilateral inferior frontal and left temporal lobes. ?MRI BRAIN 5/6 +HEMORRHAGIC CONTUSION OF FRONTAL LOBES ? ? 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 ?Significant Hospital Events: ?Including procedures, antibiotic start and stop dates in addition to other pertinent events   ?11/18/2021: Admit  to ICU with worsening SAH after emergent intubation on mechanical  ventilatory support with plans to go urgently to OR for neurosurgery ?5/5 s/p craniotomy for TRAUMATIC SAH/subdural hematoma, remains on vent ?5/5 remains on  vent, family updated ?5/6 plan for WUA, ABNORMAL MRI BRAIN CONTSUONS, FINDINGS RELAYED TO FAMILY ? ?Interim History / Subjective:  ?Remains critically ill ?Severe traumatic brain damage ?Pressors as needed ?Off all sedation x 24 hrs ?+ETOH abuse high risk for DT's ?Follow up NeuroSx recs ? ?Vent Mode: PRVC ?FiO2 (%):  [35 %] 35 % ?Set Rate:  [12 bmp] 12 bmp ?Vt Set:  [500 mL] 500 mL ?PEEP:  [5 cmH20] 5 cmH20 ?Pressure Support:  [5 cmH20] 5 cmH20 ?Plateau Pressure:  [10 cmH20-14 cmH20] 14 cmH20 ? ? ? ?Objective   ?Blood pressure 119/60, pulse 69, temperature (!) 100.9 ?F (38.3 ?C), temperature source Esophageal, resp. rate (!) 21, height '5\' 10"'$  (1.778 m), weight 86.2 kg, SpO2 100 %. ?   ?Vent Mode: PRVC ?FiO2 (%):  [35 %] 35 % ?Set Rate:  [12 bmp] 12 bmp ?Vt Set:  [500 mL] 500 mL ?PEEP:  [5 cmH20] 5 cmH20 ?Pressure Support:  [5 cmH20] 5 cmH20 ?Plateau Pressure:  [10 cmH20-14 cmH20] 14 cmH20  ? ?Intake/Output Summary (Last 24 hours) at 11/20/2021 0738 ?Last data filed at 11/20/2021 4696 ?Gross per 24 hour  ?Intake 2343.22 ml  ?Output 1315 ml  ?Net 1028.22 ml  ? ? ?Filed Weights  ? 11/22/2021 0143  ?Weight: 86.2 kg  ? ? ?REVIEW OF SYSTEMS ? ?PATIENT IS UNABLE TO PROVIDE COMPLETE REVIEW OF SYSTEMS DUE TO SEVERE CRITICAL ILLNESS AND TOXIC METABOLIC ENCEPHALOPATHY ? ? ? ?PHYSICAL EXAMINATION: ? ?GENERAL:critically ill appearing, BANDAGES  SIDE OF HEAD, JP DRAIN IN PLACE ?EYES: Pupils equal, round, reactive to light.  No scleral icterus.  ?MOUTH: Moist mucosal membrane. INTUBATED ?NECK: Supple.  ?PULMONARY: +rhonchi, +wheezing ?CARDIOVASCULAR: S1 and S2.  No murmurs  ?GASTROINTESTINAL: Soft, nontender, -distended. Positive bowel sounds.  ?MUSCULOSKELETAL: No swelling, clubbing, or edema.  ?NEUROLOGIC: obtunded ?SKIN:intact,warm,dry ? ? ? ?Assessment & Plan:  ?81 yo white  male with severe ETOH abuse with Acute Hypoxic / Hypercapnic Respiratory Failure secondary to aspiration in the setting of Traumatic SAH/Subdural Hematoma s/p craniotomy ? ?Severe ACUTE Hypoxic and Hypercapnic Respiratory Failure ?-continue Mechanical Ventilator support ?-Wean Fio2 and PEEP as tolerated ?-VAP/VENT bundle implementation ?- Wean PEEP & FiO2 as tolerated, maintain SpO2 > 88% ?- Head of bed elevated 30 degrees, VAP protocol in place ?- Plateau pressures less than 30 cm H20  ?- Intermittent chest x-ray & ABG PRN ?- Ensure adequate pulmonary hygiene  ?Off all sedation x 24 hrs ?Follow up NEURO SX recs ? ? ?NEUROLOGY ?ACUTE TOXIC METABOLIC ENCEPHALOPATHY ?Traumatic SAH secondary to mechanical fall PTA ?MRI shows BRAIN CONTUSIONS ?High risk for DT's ? ?ELECTROLYTES ?-follow labs as needed ?-replace as needed ?-pharmacy consultation and following ? ? ?SEVERE ALCOHOL WITHDRAWAL ?-Therapy with Thiamine and MVI ? ? ?ENDO ?- ICU hypoglycemic\Hyperglycemia protocol ?-check FSBS per protocol ? ? ?GI ?GI PROPHYLAXIS as indicated ? ?NUTRITIONAL STATUS ?DIET-->TF's as tolerated ?Constipation protocol as indicated ? ? ?ELECTROLYTES ?-follow labs as needed ?-replace as needed ?-pharmacy consultation and following ? ? ?Best Practice (right click and "Reselect all SmartList Selections" daily)  ?Diet/type: NPO w/ meds via tube ?DVT prophylaxis: SCD ?GI prophylaxis: PPI ?Lines: N/A ?Foley:  N/A ?Code Status:  full code ? ? ?Labs   ?CBC: ?Recent Labs  ?Lab 11/22/2021 ?0003 11/19/21 ?2952 11/20/21 ?8413  ?WBC  8.2 10.8* 12.3*  ?HGB 12.5* 9.2* 9.0*  ?HCT 40.5 30.7* 30.1*  ?MCV 82.5 83.4 83.1  ?PLT 219 152 150  ? ? ? ?Basic Metabolic Panel: ?Recent Labs  ?Lab 12/04/2021 ?0003 11/18/21 ?0725 11/19/21 ?1470 11/19/21 ?9295 11/20/21 ?7473  ?NA 139 140 140 140 141  ?K 3.3* 3.2* 3.5 3.7 3.4*  ?CL 102 107 110 107 105  ?CO2 '24 24 27 28 28  '$ ?GLUCOSE 140* 164* 185* 201* 178*  ?BUN '8 10 16 16 19  '$ ?CREATININE 0.67 0.61 0.52* 0.60* 0.51*   ?CALCIUM 9.0 7.2* 7.1* 7.6* 7.7*  ?MG  --  1.5* 2.3 2.4 2.4  ?PHOS  --  3.2 1.9*  --  2.6  ? ? ?GFR: ?Estimated Creatinine Clearance: 77.3 mL/min (A) (by C-G formula based on SCr of 0.51 mg/dL (L)). ?Recent Labs

## 2021-11-20 NOTE — Progress Notes (Addendum)
? ?   Attending Progress Note ? ?History: Zaidin Blyden is a 81 y.o presenting after a fall presenting to the ER with confusion and headache. Was found to have [place] with left sided SDH. S/p left craniotomy for evacuation on 11/19/2021.  ? ?POD3: Remains intubated.  Is more interactive today. ? ?POD2: Pt remains intubated.  He moves BUE and BLE more briskly on the L.  He is currently heavily sedated. ? ?POD1: Pt remains in the ICU on sedation, pressures, and intubated. Neurologically stable. Incision was bloody drainage yesterday evening. Was reinforced with additional staples without any continued issue.  ? ?Physical Exam: ?Vitals:  ? 11/20/21 1015 11/20/21 1030  ?BP:    ?Pulse: 71 74  ?Resp: 19 20  ?Temp: (!) 102 ?F (38.9 ?C) (!) 102.2 ?F (39 ?C)  ?SpO2: 94% 93%  ? ?Intubated but off sedation.  ?OE spontaneous, regards.  ?PERRL. Facial grimace symmetric. Does not protrude tongue. ?Moves LUE and BLE spontaneously, localizes LUE.  RUE weak withdrawal. ?Incision c/d/I ? ?Data: ? ?Recent Labs  ?Lab 11/19/21 ?0342 11/19/21 ?2774 11/20/21 ?1287  ?NA 140 140 141  ?K 3.5 3.7 3.4*  ?CL 110 107 105  ?CO2 '27 28 28  '$ ?BUN '16 16 19  '$ ?CREATININE 0.52* 0.60* 0.51*  ?GLUCOSE 185* 201* 178*  ?CALCIUM 7.1* 7.6* 7.7*  ? ?Recent Labs  ?Lab 12/14/2021 ?0003  ?AST 23  ?ALT 16  ?ALKPHOS 58  ?  ? Recent Labs  ?Lab 11/26/2021 ?0003 11/19/21 ?8676 11/20/21 ?7209  ?WBC 8.2 10.8* 12.3*  ?HGB 12.5* 9.2* 9.0*  ?HCT 40.5 30.7* 30.1*  ?PLT 219 152 150  ? ?Recent Labs  ?Lab 11/29/2021 ?0142  ?INR 1.0  ?  ?   ? ? ?Other tests/results:  ?Head CT 12/12/2021 (post-op) ?IMPRESSION: ?1. Status post left convexity subdural hematoma evacuation with ?resolution of midline shift. ?2. Residual mixed subdural and subarachnoid blood surrounding the ?cerebellum and over the left convexity, but the dominant component ?of the subdural hematoma has markedly decreased. ?  ?  ?Electronically Signed ?  By: Ulyses Jarred M.D. ?  On: 11/18/2021 19:03 ? ?MRI Brain  11/19/21 ?IMPRESSION: ?No ischemic infarction identified. Comparing the MRI to the most ?recent CT of earlier today, no change is appreciated. Sequela of ?hemorrhagic contusion of both frontal lobes and in the anterior left ?temporal lobe. Widespread subarachnoid blood and dependent ?intraventricular blood with stable ventricular size. Stable subdural ?blood along the medial aspect of the middle cranial fossa on the ?left, along the falx posteriorly and along the tentorium. ?  ?  ?Electronically Signed ?  By: Nelson Chimes M.D. ?  On: 11/19/2021 16:46 ? ?Assessment/Plan: ? ?[name] is a 81 y.o presenting after a fall resulting in tSAH and SDH s/p craniotomy for SDH evacuation on 11/20/2021. ? ?He has improved since yesterday.  He shows evidence of significant parenchymal injury to the bilateral frontal and left temporal lobes.  I reviewed his MRI with his wife and son.   ? ?- Defer to CCU regarding extubation.  ?- Would continue to take sedation breaks and keep sedation and light as possible.  ?- continue Keppra '500mg'$  BID x 7 days ?- SBP<160 ?- OK for SQH for Dvt PPX ? ?Meade Maw MD ?Department of Neurosurgery ? ?  ?

## 2021-11-20 NOTE — Progress Notes (Signed)
PHARMACY CONSULT NOTE  ? ?Pharmacy Consult for Electrolyte Monitoring and Replacement  ? ?Recent Labs: ?Potassium (mmol/L)  ?Date Value  ?11/20/2021 3.4 (L)  ? ?Magnesium (mg/dL)  ?Date Value  ?11/20/2021 2.4  ? ?Calcium (mg/dL)  ?Date Value  ?11/20/2021 7.7 (L)  ? ?Albumin (g/dL)  ?Date Value  ?11/25/2021 3.7  ? ?Phosphorus (mg/dL)  ?Date Value  ?11/20/2021 2.6  ? ?Sodium (mmol/L)  ?Date Value  ?11/20/2021 141  ? ?Corrected Ca: 7.9 mg/dL ? ?Assessment: ?81 year old male with PMH of HTN, T2DM, ETOH use, HLD admitted with subarachnoid hemorrhage. S/p craniotomy. ? ?Nutrition: Vital HP '@65ml'$ /hr- Initiate at 25m/hr and increase by 138mhr q 8 hours until goal rate is reached.  ? ----Free water flushes 3065m4 hours to maintain tube patency  ? ?Goal of Therapy:  ?Potassium 4.0 - 5.1 mmol/L ?Magnesium 2.0 - 2.4 mg/dL ?All other electrolytes within normal limits ? ?Plan:  ?K 3.4, KCL PO 23m72m 1 ordered by CCMD ?Follow up labs in AM ? ? ?WaliPearla DubonnetarmD ?11/20/2021 ?8:26 AM ?

## 2021-11-20 NOTE — Progress Notes (Signed)
SLP Cancellation Note ? ?Patient Details ?Name: Michael Cohen ?MRN: [id number] ?DOB: 1941-01-27 ? ? ?Cancelled treatment:       Reason Eval/Treat Not Completed: Patient not medically ready;Medical issues which prohibited therapy  ? ?Chart review completed. Pt remains intubated. Per most recent Critical Care note, "Updated and notified of patients medical condition- Patient remains unresponsive and will not open eyes to command. Patient is having a weak cough and struggling to remove secretions. Patient with increased WOB and using accessory muscles to breathe Explained to family course of therapy and the modalities/ Patient with Progressive multiorgan failure with a very high probablity of a very minimal chance of meaningful recovery despite all aggressive and optimal medical therapy." SLP to sign off at this time. Please reconsult when pt medically appropriate for SLP services. ? ?Cherrie Gauze, M.S., CCC-SLP ?Speech-Language Pathologist ?[facility] ?[phone] ([place]) ? ?Quintella Baton ?11/20/2021, 8:03 AM ?

## 2021-11-21 ENCOUNTER — Ambulatory Visit: Payer: Medicare Other

## 2021-11-21 ENCOUNTER — Inpatient Hospital Stay: Payer: Medicare Other

## 2021-11-21 DIAGNOSIS — Z7189 Other specified counseling: Secondary | ICD-10-CM | POA: Diagnosis not present

## 2021-11-21 DIAGNOSIS — R4182 Altered mental status, unspecified: Secondary | ICD-10-CM

## 2021-11-21 DIAGNOSIS — I609 Nontraumatic subarachnoid hemorrhage, unspecified: Secondary | ICD-10-CM | POA: Diagnosis not present

## 2021-11-21 LAB — BASIC METABOLIC PANEL
Anion gap: 9 (ref 5–15)
BUN: 24 mg/dL — ABNORMAL HIGH (ref 8–23)
CO2: 27 mmol/L (ref 22–32)
Calcium: 8 mg/dL — ABNORMAL LOW (ref 8.9–10.3)
Chloride: 106 mmol/L (ref 98–111)
Creatinine, Ser: 0.55 mg/dL — ABNORMAL LOW (ref 0.61–1.24)
GFR, Estimated: >60 mL/min (ref >60)
Glucose, Bld: 196 mg/dL — ABNORMAL HIGH (ref 70–99)
Potassium: 3.3 mmol/L — ABNORMAL LOW (ref 3.5–5.1)
Sodium: 142 mmol/L (ref 135–145)

## 2021-11-21 LAB — CBC
HCT: 30.5 % — ABNORMAL LOW (ref 39.0–52.0)
Hemoglobin: 9.2 g/dL — ABNORMAL LOW (ref 13.0–17.0)
MCH: 24.7 pg — ABNORMAL LOW (ref 26.0–34.0)
MCHC: 30.2 g/dL (ref 30.0–36.0)
MCV: 82 fL (ref 80.0–100.0)
Platelets: 165 10*3/uL (ref 150–400)
RBC: 3.72 MIL/uL — ABNORMAL LOW (ref 4.22–5.81)
RDW: 16.8 % — ABNORMAL HIGH (ref 11.5–15.5)
WBC: 10.5 10*3/uL (ref 4.0–10.5)
nRBC: 0 % (ref 0.0–0.2)

## 2021-11-21 LAB — GLUCOSE, CAPILLARY
Glucose-Capillary: 143 mg/dL — ABNORMAL HIGH (ref 70–99)
Glucose-Capillary: 147 mg/dL — ABNORMAL HIGH (ref 70–99)
Glucose-Capillary: 165 mg/dL — ABNORMAL HIGH (ref 70–99)
Glucose-Capillary: 167 mg/dL — ABNORMAL HIGH (ref 70–99)
Glucose-Capillary: 176 mg/dL — ABNORMAL HIGH (ref 70–99)
Glucose-Capillary: 183 mg/dL — ABNORMAL HIGH (ref 70–99)

## 2021-11-21 LAB — PHOSPHORUS: Phosphorus: 3.4 mg/dL (ref 2.5–4.6)

## 2021-11-21 LAB — TRIGLYCERIDES: Triglycerides: 86 mg/dL (ref <150)

## 2021-11-21 LAB — PROCALCITONIN: Procalcitonin: <0.1 ng/mL

## 2021-11-21 LAB — MAGNESIUM: Magnesium: 2.3 mg/dL (ref 1.7–2.4)

## 2021-11-21 IMAGING — US US ABDOMEN LIMITED
1 series · 14 of 25 positions shown · non-contrast
Comparison: None Available.

CLINICAL DATA: Cirrhosis.

EXAM:
ULTRASOUND ABDOMEN LIMITED RIGHT UPPER QUADRANT

[Series 1: us abdomen limited ruq (liver/gb) · 14 of 34 slices shown]
[im 1/34]
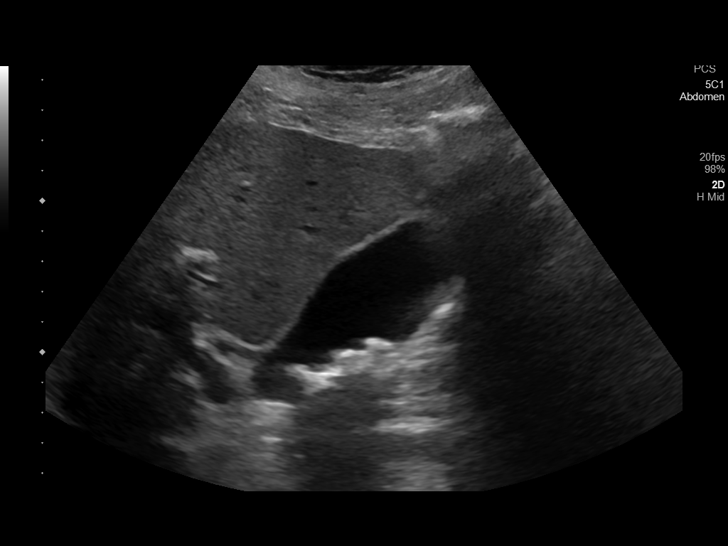
[im 3/34]
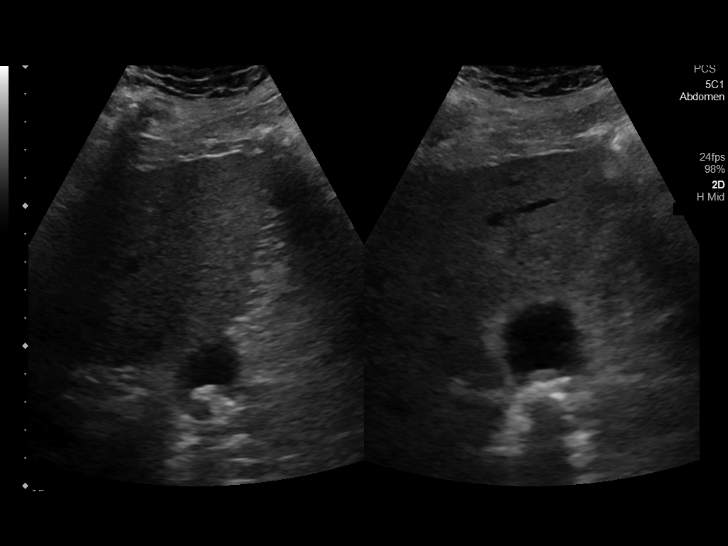
[im 6/34]
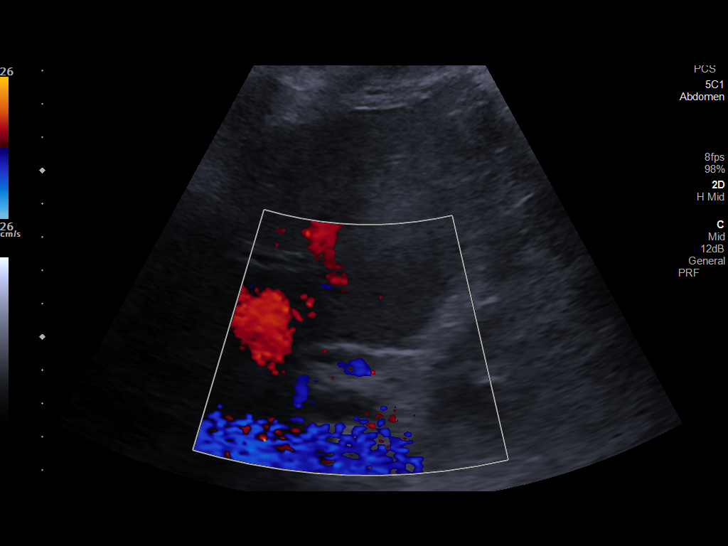
[im 9/34]
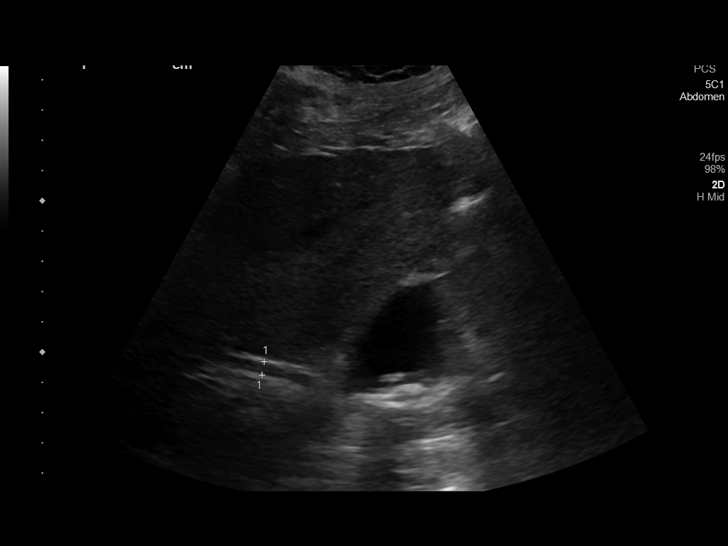
[im 12/34]
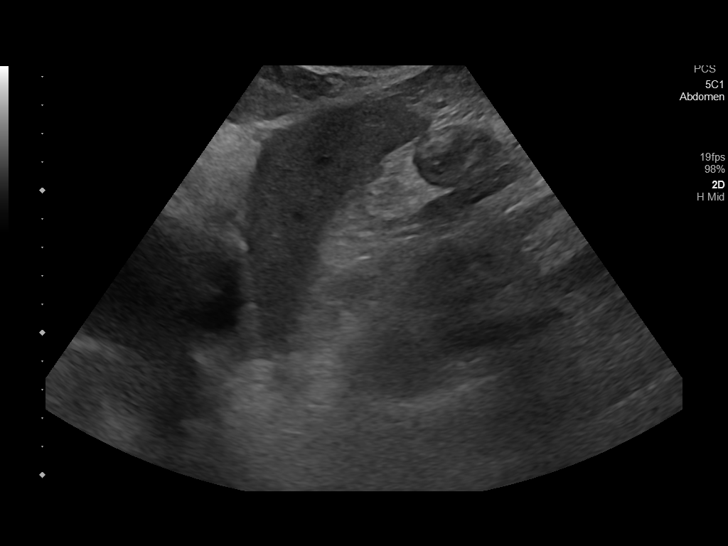
[im 13/34]
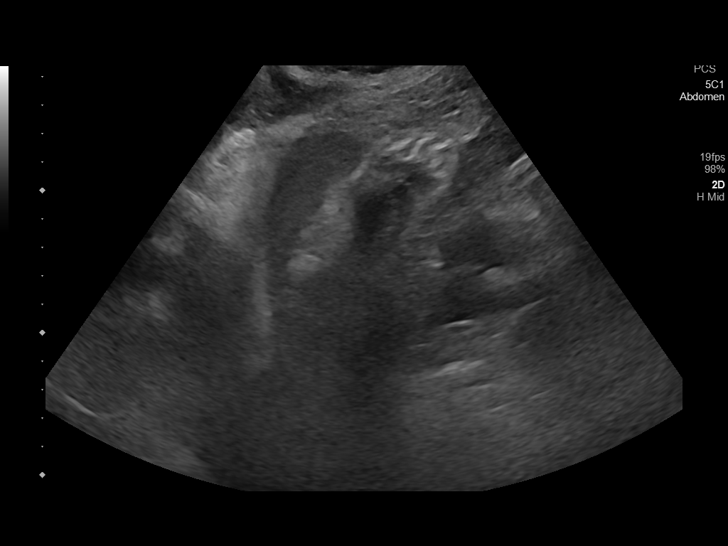
[im 16/34]
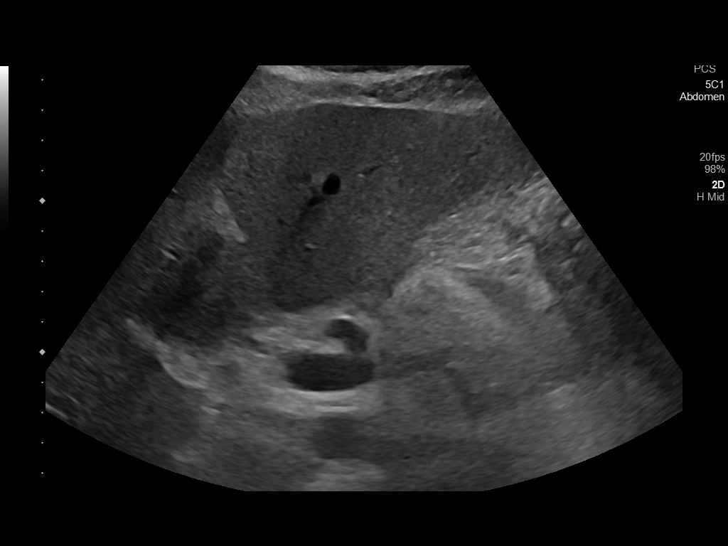
[im 18/34]
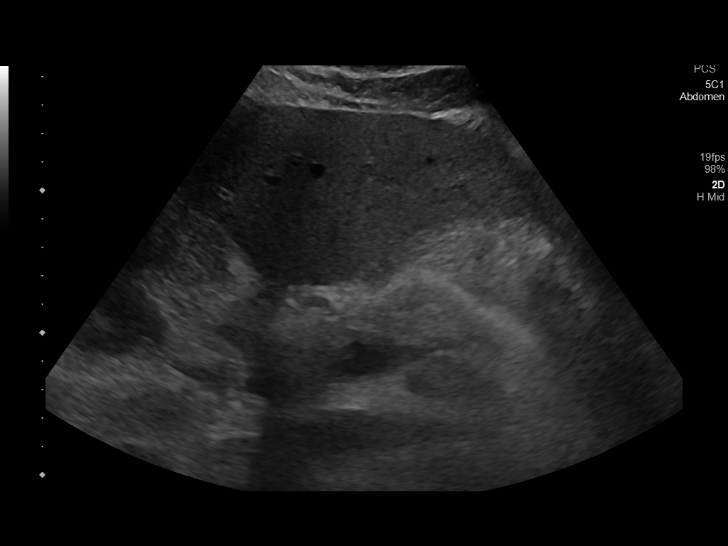
[im 21/34]
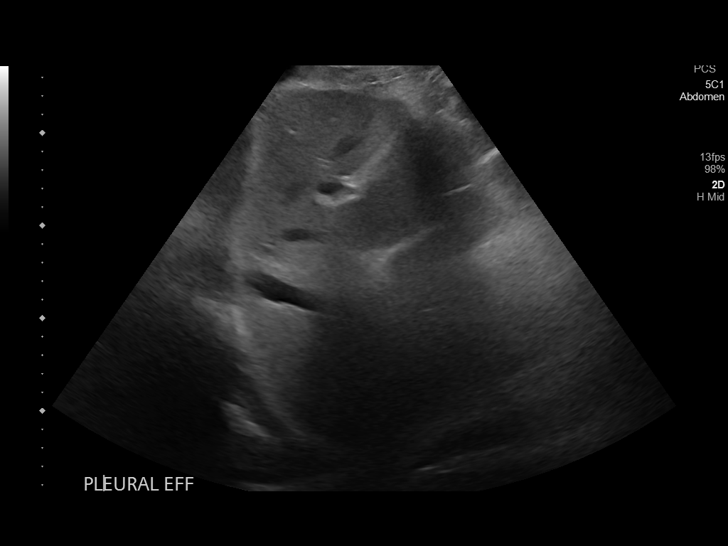
[im 23/34]
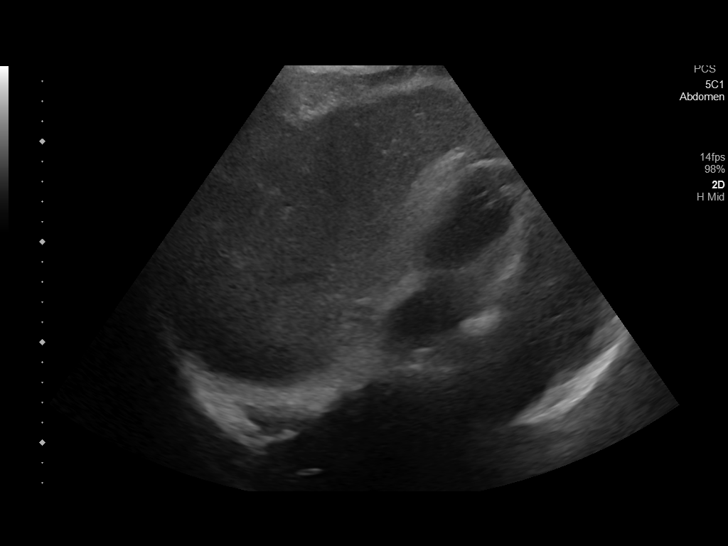
[im 25/34]
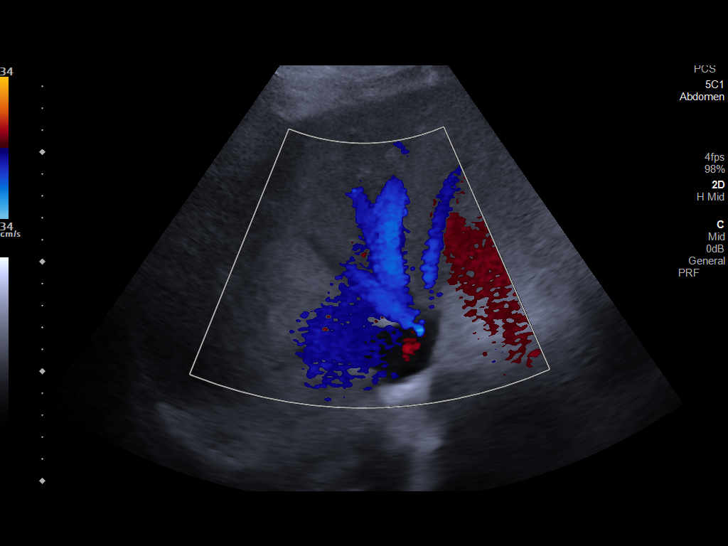
[im 28/34]
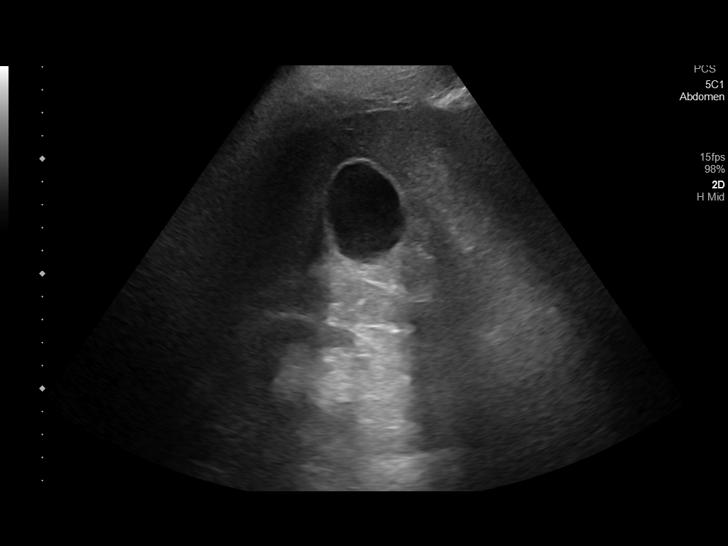
[im 31/34]
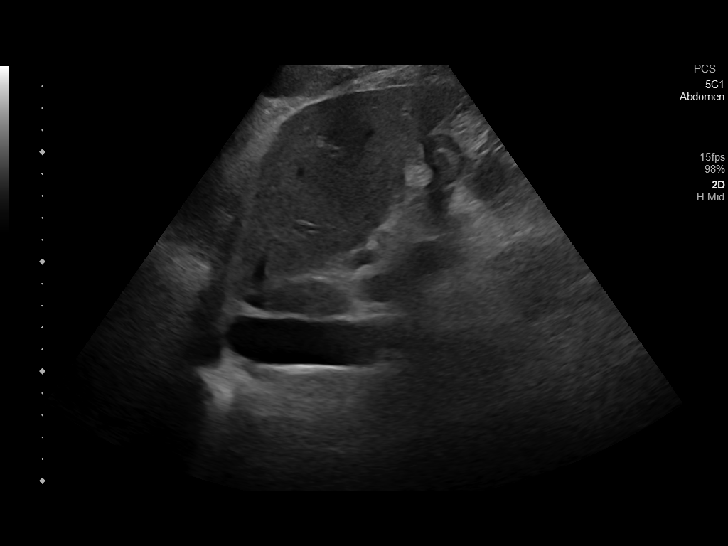
[im 34/34]
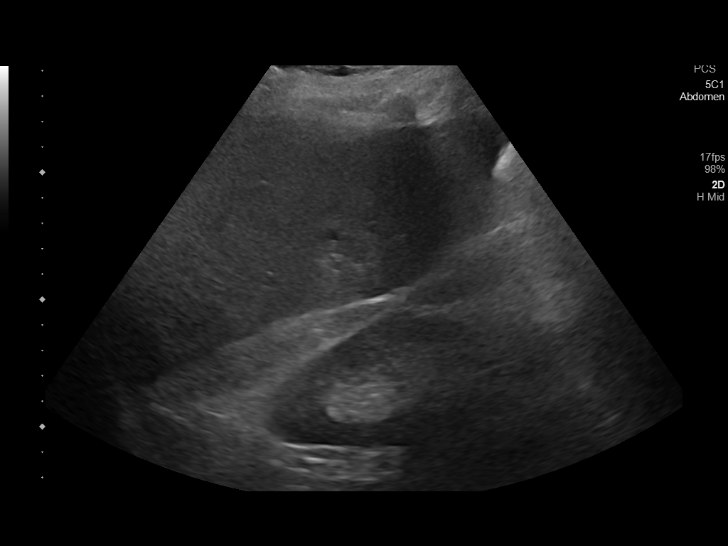

[14 of 25 positions shown; findings below may reference images not displayed]

FINDINGS: Gallbladder:

Multiple gallstones identified. No evidence of gallbladder wall
thickening or gallbladder distension.

Common bile duct:

Diameter: 5 mm.

Liver:

Suggestion of potential hepatic steatosis. No overt cirrhotic
morphology of the liver. No hepatic masses or biliary ductal
dilatation identified. The portal vein is patent and demonstrates
flow towards the liver.

Other: No visualized ascites in the right upper quadrant. A small
amount of right pleural fluid is visualized above the liver.
IMPRESSION: 1. Cholelithiasis.
2. Suggestion of hepatic steatosis without overt cirrhotic
morphology of the liver.

## 2021-11-21 MED ORDER — HEPARIN SODIUM (PORCINE) 5000 UNIT/ML IJ SOLN
5000.0000 [IU] | Freq: Three times a day (TID) | INTRAMUSCULAR | Status: DC
  Administered 2021-11-21 – 2021-11-28 (×20): 5000 [IU] via SUBCUTANEOUS
  Filled 2021-11-21 (×20): qty 1

## 2021-11-21 MED ORDER — FREE WATER
50.0000 mL | Status: DC
  Administered 2021-11-21 – 2021-11-28 (×38): 50 mL

## 2021-11-21 MED ORDER — DEXMEDETOMIDINE HCL IN NACL 400 MCG/100ML IV SOLN
0.4000 ug/kg/h | INTRAVENOUS | Status: DC
  Administered 2021-11-21: 0.4 ug/kg/h via INTRAVENOUS
  Filled 2021-11-21: qty 100

## 2021-11-21 MED ORDER — PROSOURCE TF PO LIQD
45.0000 mL | Freq: Four times a day (QID) | ORAL | Status: DC
  Administered 2021-11-21 – 2021-11-24 (×10): 45 mL
  Filled 2021-11-21 (×13): qty 45

## 2021-11-21 MED ORDER — PROPOFOL 1000 MG/100ML IV EMUL
INTRAVENOUS | Status: AC
Start: 2021-11-21 — End: 2021-11-22
  Filled 2021-11-21: qty 100

## 2021-11-21 MED ORDER — POTASSIUM CHLORIDE 20 MEQ PO PACK
40.0000 meq | PACK | Freq: Once | ORAL | Status: AC
  Administered 2021-11-21: 40 meq
  Filled 2021-11-21: qty 2

## 2021-11-21 MED ORDER — VITAL 1.5 CAL PO LIQD
1000.0000 mL | ORAL | Status: DC
  Administered 2021-11-22 – 2021-11-24 (×3): 1000 mL

## 2021-11-21 MED ORDER — FENTANYL CITRATE PF 50 MCG/ML IJ SOSY
PREFILLED_SYRINGE | INTRAMUSCULAR | Status: AC
  Administered 2021-11-21: 50 ug via INTRAVENOUS
  Filled 2021-11-21: qty 1

## 2021-11-21 MED ORDER — POTASSIUM CHLORIDE 10 MEQ/100ML IV SOLN
10.0000 meq | INTRAVENOUS | Status: AC
  Administered 2021-11-21 (×4): 10 meq via INTRAVENOUS
  Filled 2021-11-21 (×4): qty 100

## 2021-11-21 MED ORDER — POTASSIUM CHLORIDE 10 MEQ/50ML IV SOLN
10.0000 meq | INTRAVENOUS | Status: DC
  Filled 2021-11-21 (×4): qty 50

## 2021-11-21 MED ORDER — FENTANYL CITRATE PF 50 MCG/ML IJ SOSY: 50.0000 ug | PREFILLED_SYRINGE | Freq: Once | INTRAMUSCULAR | Status: AC

## 2021-11-21 MED ORDER — PROPOFOL 1000 MG/100ML IV EMUL
5.0000 ug/kg/min | INTRAVENOUS | Status: DC
  Administered 2021-11-21 – 2021-11-22 (×2): 20 ug/kg/min via INTRAVENOUS
  Administered 2021-11-23: 5 ug/kg/min via INTRAVENOUS
  Administered 2021-11-23 – 2021-11-25 (×7): 35 ug/kg/min via INTRAVENOUS
  Filled 2021-11-21 (×10): qty 100

## 2021-11-21 NOTE — TOC Initial Note (Signed)
Transition of Care (TOC) - Initial/Assessment Note  ? ? ?Patient Details  ?Name: Michael Cohen ?MRN: [id number] ?Date of Birth: Jun 22, 1941 ? ?Transition of Care (TOC) CM/SW Contact:    ?Shelbie Hutching, RN ?Phone Number: ?11/21/2021, 11:30 AM ? ?Clinical Narrative:                 ?Patient admitted to the hospital after fall at home sustaining subarachnoid hemorrhage, s/p craniotomy on 5/4.   ?Patient is from [facility] independent living with his wife.  Wife at bedside this morning, RNCM stopped by to introduce self and explain role.  Patient was going to outpatient PT here at [facility] before this happened, she also goes to OP PT here on Monday's and Wed.   ?TOC will follow and assist with disposition.  ? ?Expected Discharge Plan:  (TBD) ?Barriers to Discharge: Continued Medical Work up ? ? ?Patient Goals and CMS Choice ?Patient states their goals for this hospitalization and ongoing recovery are:: Intubated and sedated- unable to voice goals ?  ?  ? ?Expected Discharge Plan and Services ?Expected Discharge Plan:  (TBD) ?  ?  ?  ?Living arrangements for the past 2 months: [place] ?                ?DME Arranged: N/A ?DME Agency: NA ?  ?  ?  ?HH Arranged: NA ?  ?  ?  ?  ? ?Prior Living Arrangements/Services ?Living arrangements for the past 2 months: [place] ?Lives with:: Spouse ?Patient language and need for interpreter reviewed:: Yes ?       ?Need for Family Participation in Patient Care: Yes (Comment) ?Care giver support system in place?: Yes (comment) ?  ?Criminal Activity/Legal Involvement Pertinent to Current Situation/Hospitalization: No - Comment as needed ? ?Activities of Daily Living ?  ?  ? ?Permission Sought/Granted ?  ?  ? Share Information with NAME: Michael Cohen ?   ? Permission granted to share info w Relationship: Wife ? Permission granted to share info w Contact Information: [phone] ? ?Emotional Assessment ?Appearance:: Appears stated  age ?Attitude/Demeanor/Rapport: Intubated (Following Commands or Not Following Commands) ?Affect (typically observed): Unable to Assess ?  ?Alcohol / Substance Use: Not Applicable ?Psych Involvement: No (comment) ? ?Admission diagnosis:  SAH (subarachnoid hemorrhage) ([place]) [I60.9] ?Alcoholic intoxication with complication ([place]) [S93.734] ?Altered mental status, unspecified altered mental status type [R41.82] ?Unwitnessed fall [R29.6] ?Patient Active Problem List  ? Diagnosis Date Noted  ? SAH (subarachnoid hemorrhage) ([place]) 12/03/2021  ? Fall at home, initial encounter 11/29/2021  ? Essential hypertension 12/04/2021  ? Type 2 diabetes mellitus without complications ([place]) 28/76/8115  ? GERD without esophagitis 11/26/2021  ? ?PCP:  Kirk Ruths, MD ?Pharmacy:  No Pharmacies Listed ? ? ? ?Social Determinants of Health (SDOH) Interventions ?  ? ?Readmission Risk Interventions ?   ? View : No data to display.  ?  ?  ?  ? ? ? ?

## 2021-11-21 NOTE — Progress Notes (Signed)
Eeg done 

## 2021-11-21 NOTE — Progress Notes (Addendum)
Nutrition Follow Up Note  ? ?DOCUMENTATION CODES:  ? ?Not applicable ? ?INTERVENTION:  ? ?Change to Vital 1.5@55ml/hr +ProSource TF 45ml QID daily via tube ? ?Free water flushes 50ml q4 hours to maintain tube patency  ? ?Regimen provides 2140kcal/day, 133g/day protein and 1308ml/day of free water.  ? ?NUTRITION DIAGNOSIS:  ? ?Inadequate oral intake related to inability to eat (pt sedated and ventilated) as evidenced by NPO status. ? ?GOAL:  ? ?Provide needs based on ASPEN/SCCM guidelines ?-met  ? ?MONITOR:  ? ?Vent status, Labs, Weight trends, TF tolerance, Skin, I & O's ? ?ASSESSMENT:  ? ?80 y/o male with h/o etoh abuse, GERD, HTN and DM who is admitted with SDH after fall now s/p left frontotemporoparietal craniotomy for evacuation of hematoma 5/4. ? ?Pt s/p NGT placement 5/5  ? ?Pt remains sedated and ventilated. NGT in place. Tube feeds on hold today for abdominal ultrasound; will plan to resume tube feeds once complete. No new weight since admission; pt is ordered for daily weights. JP drain removed. Plan is for possible trach/PEG. Palliative care consult is pending.  ? ?Medications reviewed and include: D3, colace, folic acid, heparin, insulin, MVI, omega 3, protonix, miralax, thiamine, KCl ? ?Labs reviewed: K 3.3(L), BUN 24(H), creat 0.55(L), P 3.4 wnl, Mg 2.3 wnl ?Hgb 9.2(L), Hct 30.5(L) ?Cbgs- 167, 176, 165 x 24 hrs ? ?Patient is currently intubated on ventilator support ?MV: 16.5 L/min ?Temp (24hrs), Avg:101.2 ?F (38.4 ?C), Min:100.2 ?F (37.9 ?C), Max:102.2 ?F (39 ?C) ? ?Propofol: none  ? ?MAP- >65mmHg  ? ?UOP- 1765ml ? ?Diet Order:   ? ?Diet Order   ? ?       ?  Diet NPO time specified  Diet effective now       ?  ? ?  ?  ? ?  ? ?EDUCATION NEEDS:  ? ?No education needs have been identified at this time ? ?Skin:  Skin Assessment: Reviewed RN Assessment (incision head) ? ?Last BM:  5/8- TYPE 7 ? ?Height:  ? ?Ht Readings from Last 1 Encounters:  ?11/29/2021 5' 10" (1.778 m)  ? ? ?Weight:  ? ?Wt Readings from  Last 1 Encounters:  ?11/27/2021 86.2 kg  ? ? ?Ideal Body Weight:  75.45 kg ? ?BMI:  Body mass index is 27.26 kg/m?. ? ?Estimated Nutritional Needs:  ? ?Kcal:  2100-2400kcal/day ? ?Protein:  120-135g/day ? ?Fluid:  1.9-2.2L/day ? ?  MS, RD, LDN ?Please refer to AMION for RD and/or RD on-call/weekend/after hours pager ? ?

## 2021-11-21 NOTE — Progress Notes (Signed)
PT placed on cooling blanket for temperature control. Fever not responsive to PRN tylenol. ? ? 11/21/21 1445  ?Vitals  ?Temp (!) 101.1 ?F (38.4 ?C)  ?BP (!) 137/91  ?MAP (mmHg) 106  ?Pulse Rate 78  ?ECG Heart Rate 81  ?Resp (!) 22  ?Oxygen Therapy  ?SpO2 92 %  ?MEWS Score  ?MEWS Temp 1  ?MEWS Systolic 0  ?MEWS Pulse 0  ?MEWS RR 1  ?MEWS LOC 1  ?MEWS Score 3  ?MEWS Score Color Yellow  ? ? ?

## 2021-11-21 NOTE — Consult Note (Signed)
? ?                                                                                ?Consultation Note ?Date: 11/21/2021  ? ?Patient Name: Michael Cohen  ?DOB: [birth year]  MRN: [id number]  Age / Sex: 81 y.o., male  ?PCP: Kirk Ruths, MD ?Referring Physician: Freddi Starr, MD ? ?Reason for Consultation: Establishing goals of care ? ?HPI/Patient Profile: 82 year old male presenting to [facility] ED from [facility] on 11/22/2021 via EMS.  Per the patient's wife, he had gone out to walk the dog that evening and had fallen in the entryway hitting his head on the corner of the baseboard.  She reported he was altered post fall with complaints of a headache. Patient is not on systemic blood thinners but does take a baby aspirin every other day for heart health.  Wife confirms that the patient drinks 2 scotches daily and sometimes wine with dinner, unclear the exact amount of scotch.  She denies any other recent complaints.  No reports of witnessed seizure-like activity and the patient did confirm that he had drinking alcohol that evening. ? ?Clinical Assessment and Goals of Care: ?Notes and labs reviewed. Patient is resting in bed on ventilator. Wife is at bedside. She answers questions asked directly. She states they have been married for over 50 years and have 1 child.  ? ?She states prior to his fall, he was doing fine and was fully independent. She has been kept updated and is able to articulate his status accurately. She is hopeful he will be able to wean from the ventilator. She is taking one day at a time.   ? ?SUMMARY OF RECOMMENDATIONS   ?Will continue to follow.  ? ? ? ? ?  ? ?Primary Diagnoses: ?Present on Admission: ?**None** ? ? ?I have reviewed the medical record, interviewed the patient and family, and examined the patient. The following aspects are pertinent. ? ?Past Medical History:  ?Diagnosis Date  ? Diabetes mellitus without complication ([place])   ? Hypercholesteremia   ? Hypertension   ? ?Social  History  ? ?Socioeconomic History  ? Marital status: Married  ?  Spouse name: Not on file  ? Number of children: Not on file  ? Years of education: Not on file  ? Highest education level: Not on file  ?Occupational History  ? Not on file  ?Tobacco Use  ? Smoking status: Former  ?  Types: Cigarettes  ?  Quit date: 28  ?  Years since quitting: 42.3  ? Smokeless tobacco: Never  ?Vaping Use  ? Vaping Use: Never used  ?Substance and Sexual Activity  ? Alcohol use: Yes  ?  Alcohol/week: 14.0 standard drinks  ?  Types: 14 Shots of liquor per week  ? Drug use: Never  ? Sexual activity: Not on file  ?Other Topics Concern  ? Not on file  ?Social History Narrative  ? Not on file  ? ?Social Determinants of Health  ? ?Financial Resource Strain: Not on file  ?Food Insecurity: Not on file  ?Transportation Needs: Not on file  ?Physical Activity: Not on file  ?Stress: Not on file  ?  Social Connections: Not on file  ? ?No family history on file. ?Scheduled Meds: ?  stroke: early stages of recovery book   Does not apply Once  ? atorvastatin  40 mg Per Tube Daily  ? chlorhexidine gluconate (MEDLINE KIT)  15 mL Mouth Rinse BID  ? Chlorhexidine Gluconate Cloth  6 each Topical Daily  ? cholecalciferol  2,000 Units Per Tube Daily  ? docusate  100 mg Per Tube BID  ? feeding supplement (PROSource TF)  45 mL Per Tube QID  ? folic acid  1 mg Per Tube Daily  ? free water  50 mL Per Tube Q4H  ? heparin injection (subcutaneous)  5,000 Units Subcutaneous Q8H  ? insulin aspart  3-9 Units Subcutaneous Q4H  ? loratadine  10 mg Per Tube Daily  ? mouth rinse  15 mL Mouth Rinse 10 times per day  ? multivitamin with minerals  1 tablet Per Tube Daily  ? omega-3 acid ethyl esters  1 g Per Tube Daily  ? pantoprazole sodium  40 mg Per Tube Daily  ? polyethylene glycol  17 g Per Tube Daily  ? thiamine injection  100 mg Intravenous Daily  ? ?Continuous Infusions: ? dexmedetomidine (PRECEDEX) IV infusion 0.4 mcg/kg/hr (11/21/21 1433)  ? feeding supplement  (VITAL 1.5 CAL)    ? levETIRAcetam Stopped (11/21/21 1024)  ? ?PRN Meds:.acetaminophen **OR** acetaminophen (TYLENOL) oral liquid 160 mg/5 mL **OR** acetaminophen, fentaNYL (SUBLIMAZE) injection, midazolam, midazolam, ondansetron (ZOFRAN) IV, traZODone ?Medications Prior to Admission:  ?Prior to Admission medications   ?Medication Sig Start Date End Date Taking? Authorizing Provider  ?acetaminophen (TYLENOL) 650 MG CR tablet Take 650 mg by mouth daily as needed for pain.   Yes [provider]  ?aspirin EC 81 MG tablet Take 81 mg by mouth every Monday, Wednesday, and Friday.   Yes [provider]  ?atorvastatin (LIPITOR) 40 MG tablet Take 40 mg by mouth every evening.   Yes [provider]  ?cetirizine (ZYRTEC) 10 MG tablet Take 10 mg by mouth daily.   Yes [provider]  ?Cholecalciferol 25 MCG (1000 UT) tablet Take 2,000 Units by mouth daily.   Yes [provider]  ?gabapentin (NEURONTIN) 100 MG capsule Take 2 capsules by mouth 2 (two) times daily. 05/06/21  Yes [provider]  ?lisinopril-hydrochlorothiazide (PRINZIDE,ZESTORETIC) 10-12.5 MG tablet Take 1 tablet by mouth daily.   Yes [provider]  ?Lutein 6 MG CAPS Take 6 mg by mouth daily.   Yes [provider]  ?metFORMIN (GLUCOPHAGE-XR) 500 MG 24 hr tablet Take 1,000 mg by mouth 2 (two) times daily with a meal.   Yes [provider]  ?Multiple Vitamin (MULTIVITAMIN) capsule Take 1 capsule by mouth daily.   Yes [provider]  ?Omega-3 Fatty Acids (FISH OIL) 1000 MG CAPS Take 1,000 mg by mouth daily.   Yes [provider]  ?omeprazole (PRILOSEC) 20 MG capsule Take 40 mg by mouth daily.   Yes [provider]  ?omeprazole (PRILOSEC) 20 MG capsule Take 20 mg by mouth daily as needed (Acid reflux).   Yes [provider]  ?sitaGLIPtin (JANUVIA) 100 MG tablet Take 100 mg by mouth daily.   Yes [provider]  ? ?No Known Allergies ?Review  of Systems  ?Unable to perform ROS ? ?Physical Exam ?Constitutional:   ?   Comments: Eyes closed. On ventilator.   ? ? ?Vital Signs: BP (!) 137/91   Pulse 78   Temp (!) 101.1 ?F (  38.4 ?C)   Resp (!) 22   Ht _0  (1.778 m)   Wt 86.2 kg   SpO2 94%   BMI 27.26 kg/m?  ?Pain Scale: CPOT ?  ?  ? ? ?SpO2: SpO2: 94 % ?O2 Device:SpO2: 94 % ?O2 Flow Rate: .  ? ?IO: Intake/output summary:  ?Intake/Output Summary (Last 24 hours) at 11/21/2021 1559 ?Last data filed at 11/21/2021 1400 ?Gross per 24 hour  ?Intake 2159.98 ml  ?Output 1865 ml  ?Net 294.98 ml  ? ? ?LBM: Last BM Date :  (PTA) ?Baseline Weight: Weight: 86.2 kg ?Most recent weight: Weight: 86.2 kg     ? ? ?Asencion Gowda, NP ?  ?Please contact Palliative Medicine Team phone at [phone] for questions and concerns.  ?For individual provider: See Amion ? ? ? ? ? ? ? ? ? ? ? ? ? ?

## 2021-11-21 NOTE — Progress Notes (Signed)
At approx 1815 pt began to become asynchronous with the vent, RR >40, this RN titrated precedex gtt per order with no relief. RN gave PRN fentanyl, with no effect. RN again titrated. Pt still dyssynchronous with the vent. RN notified NP Meda Coffee orders received for an additional 46mgs of Fentanyl. Pt resting, RR 20's.  ?

## 2021-11-21 NOTE — Progress Notes (Signed)
PHARMACY CONSULT NOTE  ? ?Pharmacy Consult for Electrolyte Monitoring and Replacement  ? ?Recent Labs: ?Potassium (mmol/L)  ?Date Value  ?11/21/2021 3.3 (L)  ? ?Magnesium (mg/dL)  ?Date Value  ?11/21/2021 2.3  ? ?Calcium (mg/dL)  ?Date Value  ?11/21/2021 8.0 (L)  ? ?Albumin (g/dL)  ?Date Value  ?11/23/2021 3.7  ? ?Phosphorus (mg/dL)  ?Date Value  ?11/21/2021 3.4  ? ?Sodium (mmol/L)  ?Date Value  ?11/21/2021 142  ? ?Assessment: ?81 year old male with PMH of HTN, T2DM, ETOH use, HLD admitted with subarachnoid hemorrhage. S/p craniotomy. ? ?Nutrition: Vital HP at 65 mL/hr + free water flushes 30 mL q4h ? ?Goal of Therapy:  ?Electrolytes within normal limits ? ?Plan:  ?--K 3.3, Kcl 40 mEq per tube x 1 and IV Kcl 10 mEq x 4 per PCCM ?--Follow-up electrolytes with AM labs tomorrow ? ?Benita Gutter ?11/21/2021 ?8:10 AM ?

## 2021-11-21 NOTE — Progress Notes (Signed)
? ?   Attending Progress Note ? ?History: Michael Cohen is a 81 y.o presenting after a fall presenting to the ER with confusion and headache. Was found to have [place] with left sided SDH. S/p left craniotomy for evacuation on 12/01/2021.  ? ?POD4:requiring increased sedation overnight due to vent asynchrony.   ? ?POD3: Remains intubated.  Is more interactive today. ? ?POD2: Pt remains intubated.  He moves BUE and BLE more briskly on the L.  He is currently heavily sedated. ? ?POD1: Pt remains in the ICU on sedation, pressures, and intubated. Neurologically stable. Incision was bloody drainage yesterday evening. Was reinforced with additional staples without any continued issue.  ? ?Physical Exam: ?Vitals:  ? 11/21/21 0600 11/21/21 0800  ?BP: 132/74   ?Pulse: 71   ?Resp: (!) 24   ?Temp: (!) 100.8 ?F (38.2 ?C)   ?SpO2: 96% 96%  ? ?Remains intubated ?PERRL. Facial grimace symmetric.  ?Localizes in BUE L>R withdrawals in BLE ?Incision c/d/I ? ?Data: ? ?Recent Labs  ?Lab 11/19/21 ?6834 11/20/21 ?1962 11/21/21 ?0449  ?NA 140 141 142  ?K 3.7 3.4* 3.3*  ?CL 107 105 106  ?CO2 '28 28 27  '$ ?BUN 16 19 24*  ?CREATININE 0.60* 0.51* 0.55*  ?GLUCOSE 201* 178* 196*  ?CALCIUM 7.6* 7.7* 8.0*  ? ? ?Recent Labs  ?Lab 11/27/2021 ?0003  ?AST 23  ?ALT 16  ?ALKPHOS 58  ? ?  ? Recent Labs  ?Lab 11/19/21 ?2297 11/20/21 ?9892 11/21/21 ?0449  ?WBC 10.8* 12.3* 10.5  ?HGB 9.2* 9.0* 9.2*  ?HCT 30.7* 30.1* 30.5*  ?PLT 152 150 165  ? ? ?Recent Labs  ?Lab 11/26/2021 ?0142  ?INR 1.0  ? ?  ?   ? ? ?Other tests/results:  ?Head CT 12/06/2021 (post-op) ?IMPRESSION: ?1. Status post left convexity subdural hematoma evacuation with ?resolution of midline shift. ?2. Residual mixed subdural and subarachnoid blood surrounding the ?cerebellum and over the left convexity, but the dominant component ?of the subdural hematoma has markedly decreased. ?  ?  ?Electronically Signed ?  By: Ulyses Jarred M.D. ?  On: 12/06/2021 19:03 ? ?MRI Brain 11/19/21 ?IMPRESSION: ?No ischemic  infarction identified. Comparing the MRI to the most ?recent CT of earlier today, no change is appreciated. Sequela of ?hemorrhagic contusion of both frontal lobes and in the anterior left ?temporal lobe. Widespread subarachnoid blood and dependent ?intraventricular blood with stable ventricular size. Stable subdural ?blood along the medial aspect of the middle cranial fossa on the ?left, along the falx posteriorly and along the tentorium. ?  ?  ?Electronically Signed ?  By: Nelson Chimes M.D. ?  On: 11/19/2021 16:46 ? ?Assessment/Plan: ? ?Michael Cohen is a 81 y.o presenting after a fall resulting in tSAH and SDH s/p craniotomy for SDH evacuation on 12/13/2021. ? ?- Defer to CCU regarding extubation.  ?- Would continue to take sedation breaks and keep sedation and light as possible.  ?- continue Keppra '500mg'$  BID x 7 days ?- SBP<160 ?- OK for SQH for Dvt PPX ? ?Cooper Render PA-C ?Department of Neurosurgery ? ?  ?

## 2021-11-21 NOTE — Progress Notes (Signed)
? ?NAME:  Michael Cohen, MRN:  [id number], DOB:  1941-04-24, LOS: 4 ?ADMISSION DATE:  11/19/2021, CONSULTATION DATE:  11/19/2021 ?REFERRING MD:  Dr. Sidney Ace, CHIEF COMPLAINT: Fall   ? ?History of Present Illness:  ?81 year old male presenting to [facility] ED from [facility] on 11/21/2021 via EMS.  Per the patient's wife, he had gone out to walk the dog that evening and had fallen in the entryway hitting his head on the corner of the baseboard.  She reported he was altered post fall with complaints of a headache. Patient is not on systemic blood thinners but does take a baby aspirin every other day for heart health.  Wife confirms that the patient drinks 2 scotches daily and sometimes wine with dinner, unclear the exact amount of scotch.  She denies any other recent complaints.  No reports of witnessed seizure-like activity and the patient did confirm that he had drinking alcohol that evening. ?ED course: ?Head CT showed large subarachnoid hemorrhage.  Follow-up CTA revealed no aneurysm.  EDP spoke with Dr. Cari Caraway with neurosurgery who initially recommended admitting to hospitalist service with repeat CT in 6 hours.  TRH admitted patient to stepdown unit.  However, patient still boarding in ED when he became agitated attempting to get out of bed and then vomited.  At this point EDP described patient as having gurgling respirations with SPO2 83% on room air.  Patient was emergently intubated requiring mechanical ventilatory support.  Stat repeat head CT revealed new subdural bleed with 7 mm midline shift.  Dr. Cari Caraway from neurosurgery made aware of change, discussed options bedside with wife and the decision was made to take the patient urgently to the OR and transferred directly to ICU. ?medications given: Keppra 1000 mg, mannitol 50 g, IV contrast, fentanyl, 1 L NS, Tdap, banana bag, etomidate, succinylcholine, propofol drip started ?Initial Vitals: 97.3 F, 17, 62, 117/67 and SPO2 95% on room air ?Significant labs:  (Labs/ Imaging personally reviewed) ?I, Domingo Pulse Rust-[place], AGACNP-BC, personally viewed and interpreted this ECG. ?EKG Interpretation: Date: 11/16/2021, EKG Time: 23: 55, Rate: 63, Rhythm: First-degree heart block, QRS Axis: Normal,  ?Intervals: First-degree heart block, borderline prolonged QTc, ST/T Wave abnormalities: None, Narrative Interpretation: NSR with first-degree heart block ?Chemistry: Na+:139, K+: 3.3, BUN/Cr.: 8/0.67, Serum CO2/ AG: 24/13 ?Hematology: WBC: 8.2, Hgb: 12.5,  ?Troponin: 6 >5, COVID-19 & Influenza A/B: pending ?ABG: pending ?CXR 12/02/2021: Low lung volumes with probable areas of passive subsegmental atelectasis, as above ?CT head Wo contrast 11/19/2021: Diffuse subarachnoid hemorrhage as described above concentrated predominately in the left sylvian fissure. 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 CTA of the head is ?recommended for further evaluation. Chronic atrophic and ischemic changes are noted ?CT cervical spine 12/07/2021: Degenerative change without acute abnormality ?CT angio head 11/15/2021: No aneurysm, occlusion or high-grade stenosis of the intracranial arteries ?CT head wo contrast 11/18/2021: New subdural hematoma along the majority of the left cerebral convexity measuring up to 1 cm in thickness and primarily responsible for new 7 mm midline shift. Some thickening of subarachnoid hemorrhage although stable pattern. Trace subdural hematoma along the anterior right frontal convexity. Early visualization of cerebral contusion in the bilateral inferior frontal and left temporal lobes. ? ?PCCM consulted due to deterioration requiring emergent intubation and mechanical ventilatory support. ? 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 ?Significant Hospital Events: ?Including procedures, antibiotic start and stop dates in addition to other pertinent events   ?  5/4: Admit to ICU with worsening  SAH after emergent intubation on mechanical ventilatory support with plans to go urgently to OR for neurosurgery ?5/5: s/p craniotomy for TRAUMATIC SAH/subdural hematoma, remains on vent ?5/5: remains on  vent, family updated ?5/6: JP drain removed per Neurosurgery  ?5/6: MRI Brain revealed no ischemic infarction identified. Comparing the MRI to  ?     the most recent CT of earlier today, no change is appreciated. Sequela of   ?     hemorrhagic contusion of both frontal lobes and in the anterior left temporal  ?     lobe. Widespread subarachnoid blood and dependent intraventricular blood with  ?     stable ventricular size. Stable subdural blood along the medial aspect of the  ?     middle cranial fossa on the left, along the falx posteriorly and along the  ?     tentorium. ?5/6: CT Head revealed multifocal intracranial hemorrhage following trauma and  ?      surgical evacuation of left side subdural has not significantly changed since    ?      11/29/2021. Conspicuous cerebral edema throughout the left temporal lobe and ?       in both inferior frontal gyri is favored to be posttraumatic, and is at least in part  ?       due to hemorrhagic cerebral contusions. Mild rightward midline shift of 4-5 mm  ?       has slightly increased, but there is no other progressive intracranial mass effect.  ?       A moderate volume of intraventricular hemorrhage has increased, but ventricle  ?       size and configuration remains stable. ?5/8: Overnight propofol gtt started due to vent asynchrony overnight.  Pt unable to follow commands.   ?5/8: EEG suggestive of cortical dysfunction in left frontotemporal region likely secondary to underlying structural abnormality as well as moderate to severe diffuse encephalopathy, nonspecific to etiology.  No seizures or definite epileptiform discharges were seen throughout the recording. ? ?Interim History / Subjective:  ?Pt remains mechanically intubated on minimal vent settings: PEEP  5/FiO2 35%.  He is unable to follow commands.  Pt with increased work of breathing/tachypnea during [place]  ? ?Objective   ?Blood pressure 138/79, pulse 73, temperature (!) 102 ?F (38.9 ?C), temperature source Esophageal, resp. rate (!) 25, height '5\' 10"'$  (1.778 m), weight 86.2 kg, SpO2 97 %. ?   ?Vent Mode: PRVC ?FiO2 (%):  [35 %] 35 % ?Set Rate:  [12 bmp] 12 bmp ?Vt Set:  [500 mL] 500 mL ?PEEP:  [5 cmH20] 5 cmH20 ?Plateau Pressure:  [0 cmH20-14 cmH20] 14 cmH20  ? ?Intake/Output Summary (Last 24 hours) at 11/21/2021 1213 ?Last data filed at 11/21/2021 1009 ?Gross per 24 hour  ?Intake 1844.12 ml  ?Output 1565 ml  ?Net 279.12 ml  ? ?Filed Weights  ? 12/08/2021 0143  ?Weight: 86.2 kg  ? ? ?Examination: ?General: Chronically ill appearing male, NAD on mechanical ventilation  ?HEENT: Left head incision site staples intact no drainage present, no JVD  ?Neuro: Sedated, not following commands, withdraws from vigorous stimulation RLE/RUE/LLE, however LUE hemiparesis present, PERRL  ?CV: s1s2 RRR, NSR on monitor, no r/m/g ?Pulm: Faint rhonchi throughout, tachypnea with use of accessory muscles to breath  ?GI: soft, rounded, bs x 4 ?GU: external catheter in place with clear yellow urine ?Skin: limited exam- hematoma on posterior head ?Extremities: warm/dry, pulses + 2 R/P,  no edema noted ? ?Resolved Hospital Problem list   ? ? ?Assessment & Plan:  ?Acute Hypoxic / Hypercapnic Respiratory Failure secondary to aspiration in the setting of Traumatic SAH ?Risk for Aspiration Pneumonia ?- Ventilator settings: PRVC  8 mL/kg, 60% FiO2, 5 PEEP, continue ventilator support & lung protective strategies ?- Wean PEEP & FiO2 as tolerated, maintain SpO2 > 90% ?- Head of bed elevated 30 degrees, VAP protocol in place ?- Plateau pressures less than 30 cm H20  ?- Intermittent chest x-ray & ABG PRN ?- Daily WUA with SBT as tolerated  ?- Ensure adequate pulmonary hygiene  ?- F/u cultures, trend PCT ?- Bronchodilators PRN ?- PAD protocol in place: Will  start precedex gtt to maintain RASS goal 0 to -1; avoid benzodiazepines  ? ?Acute encephalopathy secondary to traumatic SAH secondary to mechanical fall PTA s/p left craniotomy for SDH evacuation 11/17/21 ?Right up

## 2021-11-21 NOTE — Procedures (Signed)
Patient Name: Michael Cohen  ?MRN: [id number]  ?Epilepsy Attending: Lora Havens  ?Referring Physician/Provider: Flora Lipps, MD ?Date: 11/21/2021 ?Duration: 26.35 mins ? ?Patient history: 81 y.o presenting after a fall presenting to the ER with confusion and headache. Was found to have [place] with left sided SDH. S/p left craniotomy for evacuation on 12/03/2021. EEG to evaluate for seizure ? ?Level of alertness:  lethargic  ? ?AEDs during EEG study: Propofol, Keppra ? ?Technical aspects: This EEG study was done with scalp electrodes positioned according to the 10-20 International system of electrode placement. Electrical activity was acquired at a sampling rate of '500Hz'$  and reviewed with a high frequency filter of '70Hz'$  and a low frequency filter of '1Hz'$ . EEG data were recorded continuously and digitally stored.  ? ?Description: EEG showed continuous 3 to 7 Hz theta-delta slowing in right hemisphere as well as low amplitude 2 to 3 Hz sharply contoured delta slowing in left hemisphere, maximal left frontotemporal region. Hyperventilation and photic stimulation were not performed.    ? ?ABNORMALITY ?-Continuous slow, generalized and maximal left frontotemporal region ? ?IMPRESSION: ?This study is suggestive of cortical dysfunction in left frontotemporal region likely secondary to underlying structural abnormality as well as moderate to severe diffuse encephalopathy, nonspecific to etiology.  No seizures or definite epileptiform discharges were seen throughout the recording. ? ?Lora Havens  ? ?

## 2021-11-22 ENCOUNTER — Inpatient Hospital Stay: Payer: Medicare Other

## 2021-11-22 DIAGNOSIS — I609 Nontraumatic subarachnoid hemorrhage, unspecified: Secondary | ICD-10-CM | POA: Diagnosis not present

## 2021-11-22 LAB — CBC WITH DIFFERENTIAL/PLATELET
Abs Immature Granulocytes: 0.1 10*3/uL — ABNORMAL HIGH (ref 0.00–0.07)
Basophils Absolute: 0 10*3/uL (ref 0.0–0.1)
Basophils Relative: 0 %
Eosinophils Absolute: 0 10*3/uL (ref 0.0–0.5)
Eosinophils Relative: 0 %
HCT: 39.1 % (ref 39.0–52.0)
Hemoglobin: 12 g/dL — ABNORMAL LOW (ref 13.0–17.0)
Immature Granulocytes: 1 %
Lymphocytes Relative: 11 %
Lymphs Abs: 1.4 10*3/uL (ref 0.7–4.0)
MCH: 25 pg — ABNORMAL LOW (ref 26.0–34.0)
MCHC: 30.7 g/dL (ref 30.0–36.0)
MCV: 81.5 fL (ref 80.0–100.0)
Monocytes Absolute: 1.2 10*3/uL — ABNORMAL HIGH (ref 0.1–1.0)
Monocytes Relative: 9 %
Neutro Abs: 10.2 10*3/uL — ABNORMAL HIGH (ref 1.7–7.7)
Neutrophils Relative %: 79 %
Platelets: 163 10*3/uL (ref 150–400)
RBC: 4.8 MIL/uL (ref 4.22–5.81)
RDW: 16.9 % — ABNORMAL HIGH (ref 11.5–15.5)
Smear Review: UNDETERMINED
WBC: 13 10*3/uL — ABNORMAL HIGH (ref 4.0–10.5)
nRBC: 0 % (ref 0.0–0.2)

## 2021-11-22 LAB — GLUCOSE, CAPILLARY
Glucose-Capillary: 176 mg/dL — ABNORMAL HIGH (ref 70–99)
Glucose-Capillary: 198 mg/dL — ABNORMAL HIGH (ref 70–99)
Glucose-Capillary: 195 mg/dL — ABNORMAL HIGH (ref 70–99)
Glucose-Capillary: 157 mg/dL — ABNORMAL HIGH (ref 70–99)
Glucose-Capillary: 179 mg/dL — ABNORMAL HIGH (ref 70–99)
Glucose-Capillary: 206 mg/dL — ABNORMAL HIGH (ref 70–99)

## 2021-11-22 LAB — BASIC METABOLIC PANEL
Anion gap: 10 (ref 5–15)
BUN: 27 mg/dL — ABNORMAL HIGH (ref 8–23)
CO2: 25 mmol/L (ref 22–32)
Calcium: 8.6 mg/dL — ABNORMAL LOW (ref 8.9–10.3)
Chloride: 106 mmol/L (ref 98–111)
Creatinine, Ser: 0.62 mg/dL (ref 0.61–1.24)
GFR, Estimated: >60 mL/min (ref >60)
Glucose, Bld: 211 mg/dL — ABNORMAL HIGH (ref 70–99)
Potassium: 4.5 mmol/L (ref 3.5–5.1)
Sodium: 141 mmol/L (ref 135–145)

## 2021-11-22 LAB — BLOOD GAS, ARTERIAL
Acid-Base Excess: 3.4 mmol/L — ABNORMAL HIGH (ref 0.0–2.0)
Bicarbonate: 26.8 mmol/L (ref 20.0–28.0)
FIO2: 35 %
MECHVT: 500 mL
Mechanical Rate: 12
O2 Saturation: 92 %
PEEP: 5 cmH2O
Patient temperature: 37
pCO2 arterial: 36 mmHg (ref 32–48)
pH, Arterial: 7.48 — ABNORMAL HIGH (ref 7.35–7.45)
pO2, Arterial: 59 mmHg — ABNORMAL LOW (ref 83–108)

## 2021-11-22 LAB — PROCALCITONIN: Procalcitonin: <0.1 ng/mL

## 2021-11-22 LAB — PHOSPHORUS: Phosphorus: 5 mg/dL — ABNORMAL HIGH (ref 2.5–4.6)

## 2021-11-22 LAB — MRSA NEXT GEN BY PCR, NASAL: MRSA by PCR Next Gen: NOT DETECTED

## 2021-11-22 LAB — TRIGLYCERIDES: Triglycerides: 158 mg/dL — ABNORMAL HIGH (ref <150)

## 2021-11-22 LAB — MAGNESIUM: Magnesium: 2.5 mg/dL — ABNORMAL HIGH (ref 1.7–2.4)

## 2021-11-22 IMAGING — DX DG CHEST 1V PORT
1 series · 1 of 1 positions shown · non-contrast
Comparison: Portable exam [Y6] hours compared to [DATE]

CLINICAL DATA: Acute respiratory failure

EXAM:
PORTABLE CHEST 1 VIEW

[chest ap]
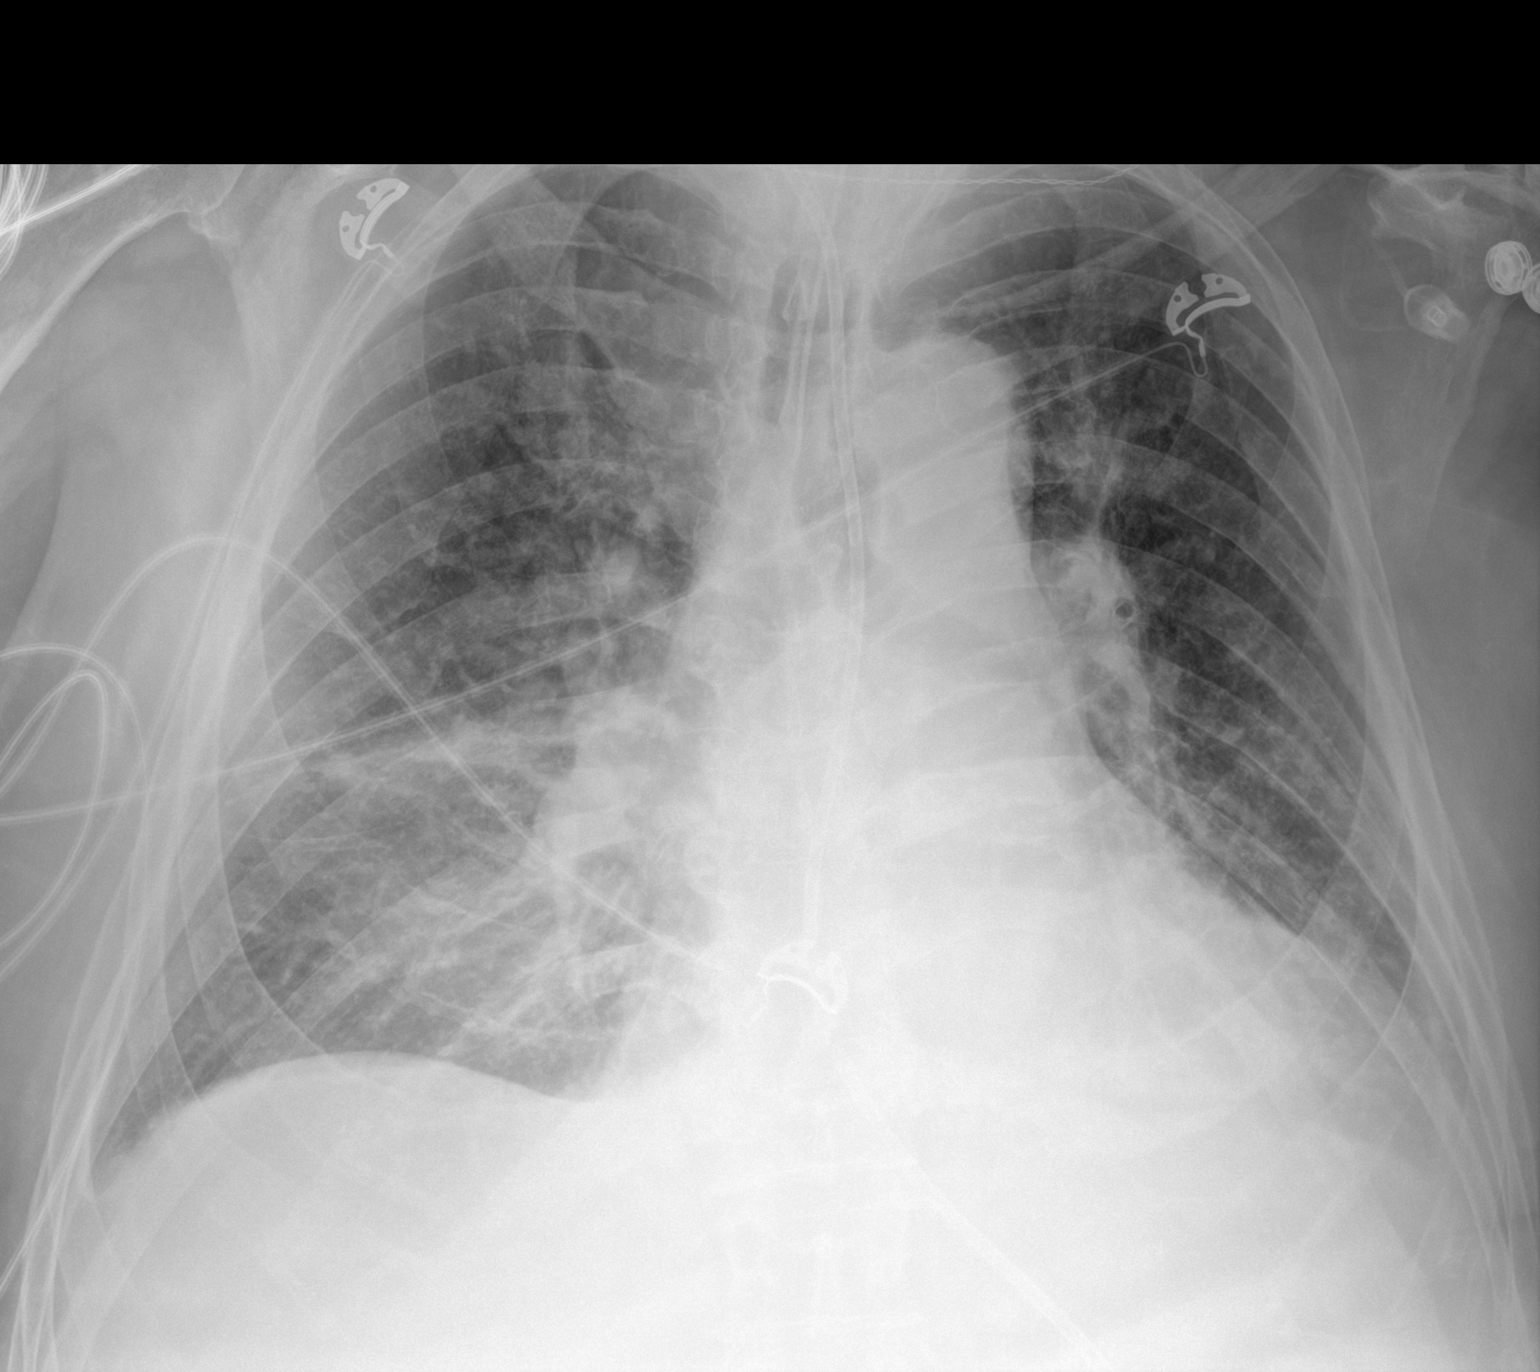

[1 of 1 positions shown; findings below may reference images not displayed]

FINDINGS: Tip of endotracheal tube projects 3.8 cm above carina.

Feeding tube extends into stomach.

Normal heart size, mediastinal contours, and pulmonary vascularity.

Bibasilar atelectasis.

No acute infiltrate, pleural effusion, or pneumothorax.
IMPRESSION: Bibasilar atelectasis.

## 2021-11-22 MED ORDER — OXYCODONE-ACETAMINOPHEN 5-325 MG PO TABS: 1.0000 | ORAL_TABLET | Freq: Four times a day (QID) | ORAL | Status: DC | PRN

## 2021-11-22 MED ORDER — SODIUM CHLORIDE 0.9 % IV SOLN
500.0000 mg | INTRAVENOUS | Status: AC
  Administered 2021-11-22: 500 mg via INTRAVENOUS
  Filled 2021-11-22: qty 500

## 2021-11-22 MED ORDER — FUROSEMIDE 10 MG/ML IJ SOLN
40.0000 mg | Freq: Once | INTRAMUSCULAR | Status: AC
  Administered 2021-11-22: 40 mg via INTRAVENOUS
  Filled 2021-11-22: qty 4

## 2021-11-22 MED ORDER — VANCOMYCIN HCL IN DEXTROSE 1-5 GM/200ML-% IV SOLN
1000.0000 mg | Freq: Two times a day (BID) | INTRAVENOUS | Status: DC
  Administered 2021-11-23 – 2021-11-24 (×3): 1000 mg via INTRAVENOUS
  Filled 2021-11-22 (×3): qty 200

## 2021-11-22 MED ORDER — VANCOMYCIN HCL 2000 MG/400ML IV SOLN
2000.0000 mg | Freq: Once | INTRAVENOUS | Status: AC
  Administered 2021-11-22: 2000 mg via INTRAVENOUS
  Filled 2021-11-22: qty 400

## 2021-11-22 MED ORDER — FENTANYL CITRATE PF 50 MCG/ML IJ SOSY
25.0000 ug | PREFILLED_SYRINGE | INTRAMUSCULAR | Status: DC | PRN
  Administered 2021-11-22: 50 ug via INTRAVENOUS
  Filled 2021-11-22 (×2): qty 1

## 2021-11-22 MED ORDER — DEXTROSE 5 % IV SOLN
250.0000 mg | INTRAVENOUS | Status: DC
  Administered 2021-11-23 – 2021-11-24 (×2): 250 mg via INTRAVENOUS
  Filled 2021-11-22 (×2): qty 2.5

## 2021-11-22 MED ORDER — PIPERACILLIN-TAZOBACTAM 3.375 G IVPB
3.3750 g | Freq: Three times a day (TID) | INTRAVENOUS | Status: DC
  Administered 2021-11-22 – 2021-11-24 (×6): 3.375 g via INTRAVENOUS
  Filled 2021-11-22 (×6): qty 50

## 2021-11-22 MED ORDER — DEXMEDETOMIDINE HCL IN NACL 400 MCG/100ML IV SOLN
0.4000 ug/kg/h | INTRAVENOUS | Status: DC
  Administered 2021-11-22 – 2021-11-23 (×3): 0.4 ug/kg/h via INTRAVENOUS
  Filled 2021-11-22 (×3): qty 100

## 2021-11-22 NOTE — Plan of Care (Signed)

## 2021-11-22 NOTE — Progress Notes (Addendum)
? ?                                                                                                                                                     ?                                                   ?Daily Progress Note  ? ?Patient Name: Michael Cohen       Date: 11/22/2021 ?DOB: 1941-07-05  Age: 81 y.o. MRN#: [id number] ?Attending Physician: Freddi Starr, MD ?Primary Care Physician: Kirk Ruths, MD ?Admit Date: 12/03/2021 ? ?Reason for Consultation/Follow-up: Establishing goals of care ? ?Subjective: ?Notes reviewed. Patient is resting in bed on ventilator. Wife is at bedside. 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 She tells me they moved here to retire. Wife tells me he is a man of [place]. She tells me she has support, and that her son is here and working remotely.   ? ?She tells me she and her son have been talking. She states she is prepared for him not to be able to speak or not to be able to walk. She states there have been discussions by CCM on her thoughts regarding a tracheostomy. She states they would want a tracheostomy, and a feeding tube. She states we would want all care indicated at this time. Discussed the future. She tells me he would not want to live in a persistent vegetative state, and would not want a feeding tube long term. She is unsure of the time frame for determining outcomes. Discussed what suffering would mean to him. Discussed considering long term acceptable vs unacceptable QOL.  ? ?Length of Stay: 5 ? ?Current Medications: ?Scheduled Meds:  ?  stroke: early stages of recovery book   Does not apply Once  ? atorvastatin  40 mg Per Tube Daily  ? chlorhexidine gluconate (MEDLINE KIT)  15 mL Mouth Rinse BID  ? Chlorhexidine Gluconate Cloth  6 each Topical Daily  ? cholecalciferol  2,000 Units Per Tube Daily  ? docusate  100 mg Per Tube BID  ? feeding supplement (PROSource TF)  45 mL Per Tube QID  ? folic  acid  1 mg Per Tube Daily  ? free water  50 mL Per Tube Q4H  ? heparin injection (subcutaneous)  5,000 Units Subcutaneous Q8H  ? insulin aspart  3-9 Units Subcutaneous Q4H  ? loratadine  10 mg Per Tube Daily  ? mouth rinse  15 mL Mouth Rinse 10 times per day  ? multivitamin with minerals  1 tablet Per Tube Daily  ? omega-3 acid  ethyl esters  1 g Per Tube Daily  ? pantoprazole sodium  40 mg Per Tube Daily  ? polyethylene glycol  17 g Per Tube Daily  ? thiamine injection  100 mg Intravenous Daily  ? ? ?Continuous Infusions: ? dexmedetomidine (PRECEDEX) IV infusion 0.4 mcg/kg/hr (11/22/21 1121)  ? feeding supplement (VITAL 1.5 CAL) 1,000 mL (11/22/21 1102)  ? levETIRAcetam 500 mg (11/22/21 1126)  ? propofol (DIPRIVAN) infusion Stopped (11/22/21 0802)  ? ? ?PRN Meds: ?acetaminophen **OR** acetaminophen (TYLENOL) oral liquid 160 mg/5 mL **OR** acetaminophen, fentaNYL (SUBLIMAZE) injection, midazolam, ondansetron (ZOFRAN) IV, oxyCODONE-acetaminophen, traZODone ? ?Physical Exam ?Constitutional:   ?   Comments: Eyes closed. On ventilator.   ?         ? ?Vital Signs: BP 130/86 (BP Location: Left Arm)   Pulse 89   Temp 99.9 ?F (37.7 ?C) (Esophageal)   Resp 19   Ht _0  (1.778 m)   Wt 88.2 kg   SpO2 96%   BMI 27.90 kg/m?  ?SpO2: SpO2: 96 % ?O2 Device: O2 Device: Ventilator ?O2 Flow Rate:   ? ?Intake/output summary:  ?Intake/Output Summary (Last 24 hours) at 11/22/2021 1257 ?Last data filed at 11/22/2021 1121 ?Gross per 24 hour  ?Intake 653.19 ml  ?Output 3100 ml  ?Net -2446.81 ml  ? ?LBM: Last BM Date : 11/21/21 ?Baseline Weight: Weight: 86.2 kg ?Most recent weight: Weight: 88.2 kg ? ? ? ?Patient Active Problem List  ? Diagnosis Date Noted  ? SAH (subarachnoid hemorrhage) ([place]) 12/03/2021  ? Fall at home, initial encounter 12/02/2021  ? Essential hypertension 12/05/2021  ? Type 2 diabetes mellitus without complications ([place]) 23/36/1224  ? GERD without esophagitis 11/26/2021  ? ? ?Palliative Care Assessment & Plan   ? ? ?Recommendations/Plan: ?Full code/full scope.  ? ? ?Code Status: ? ?  ?Code Status Orders  ?(From admission, onward)  ?  ? ? ?  ? ?  Start     Ordered  ? 11/19/21 1431  Do not attempt resuscitation (DNR)  Continuous       ?Question Answer Comment  ?In the event of cardiac or respiratory ARREST Do not call a ?code blue?   ?In the event of cardiac or respiratory ARREST Do not perform Intubation, CPR, defibrillation or ACLS   ?In the event of cardiac or respiratory ARREST Use medication by any route, position, wound care, and other measures to relive pain and suffering. May use oxygen, suction and manual treatment of airway obstruction as needed for comfort.   ?  ? 11/19/21 1430  ? ?  ?  ? ?  ? ?Code Status History   ? ? Date Active Date Inactive Code Status Order ID Comments User Context  ? 11/19/2021 0222 11/19/2021 1430 Full Code [id number]  Christel Mormon, MD ED  ? ?  ? ?Thank you for allowing the Palliative Medicine Team to assist in the care of this patient. ? ? ?Asencion Gowda, NP ? ?Please contact Palliative Medicine Team phone at [phone] for questions and concerns.  ? ? ? ? ? ?

## 2021-11-22 NOTE — Progress Notes (Signed)
? ?NAME:  Michael Cohen, MRN:  [id number], DOB:  09-08-1940, LOS: 5 ?ADMISSION DATE:  12/11/2021, CONSULTATION DATE:  12/01/2021 ?REFERRING MD:  Dr. Sidney Ace, CHIEF COMPLAINT: Fall   ? ?History of Present Illness:  ?81 year old male presenting to [facility] ED from [facility] on 11/19/2021 via EMS.  Per the patient's wife, he had gone out to walk the dog that evening and had fallen in the entryway hitting his head on the corner of the baseboard.  She reported he was altered post fall with complaints of a headache. Patient is not on systemic blood thinners but does take a baby aspirin every other day for heart health.  Wife confirms that the patient drinks 2 scotches daily and sometimes wine with dinner, unclear the exact amount of scotch.  She denies any other recent complaints.  No reports of witnessed seizure-like activity and the patient did confirm that he had drinking alcohol that evening. ?ED course: ?Head CT showed large subarachnoid hemorrhage.  Follow-up CTA revealed no aneurysm.  EDP spoke with Dr. Cari Caraway with neurosurgery who initially recommended admitting to hospitalist service with repeat CT in 6 hours.  TRH admitted patient to stepdown unit.  However, patient still boarding in ED when he became agitated attempting to get out of bed and then vomited.  At this point EDP described patient as having gurgling respirations with SPO2 83% on room air.  Patient was emergently intubated requiring mechanical ventilatory support.  Stat repeat head CT revealed new subdural bleed with 7 mm midline shift.  Dr. Cari Caraway from neurosurgery made aware of change, discussed options bedside with wife and the decision was made to take the patient urgently to the OR and transferred directly to ICU. ?medications given: Keppra 1000 mg, mannitol 50 g, IV contrast, fentanyl, 1 L NS, Tdap, banana bag, etomidate, succinylcholine, propofol drip started ?Initial Vitals: 97.3 F, 17, 62, 117/67 and SPO2 95% on room air ?Significant labs:  (Labs/ Imaging personally reviewed) ?I, Domingo Pulse [place], AGACNP-BC, personally viewed and interpreted this ECG. ?EKG Interpretation: Date: 11/20/2021, EKG Time: 23: 55, Rate: 63, Rhythm: First-degree heart block, QRS Axis: Normal,  ?Intervals: First-degree heart block, borderline prolonged QTc, ST/T Wave abnormalities: None, Narrative Interpretation: NSR with first-degree heart block ?Chemistry: Na+:139, K+: 3.3, BUN/Cr.: 8/0.67, Serum CO2/ AG: 24/13 ?Hematology: WBC: 8.2, Hgb: 12.5,  ?Troponin: 6 >5, COVID-19 & Influenza A/B: pending ?ABG: pending ?CXR 12/07/2021: Low lung volumes with probable areas of passive subsegmental atelectasis, as above ?CT head Wo contrast 12/02/2021: Diffuse subarachnoid hemorrhage as described above concentrated predominately in the left sylvian fissure. 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 CTA of the head is ?recommended for further evaluation. Chronic atrophic and ischemic changes are noted ?CT cervical spine 12/07/2021: Degenerative change without acute abnormality ?CT angio head 12/07/2021: No aneurysm, occlusion or high-grade stenosis of the intracranial arteries ?CT head wo contrast 12/02/2021: New subdural hematoma along the majority of the left cerebral convexity measuring up to 1 cm in thickness and primarily responsible for new 7 mm midline shift. Some thickening of subarachnoid hemorrhage although stable pattern. Trace subdural hematoma along the anterior right frontal convexity. Early visualization of cerebral contusion in the bilateral inferior frontal and left temporal lobes. ? ?PCCM consulted due to deterioration requiring emergent intubation and mechanical ventilatory support. ? 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 ?Significant Hospital Events: ?Including procedures, antibiotic start and stop dates in addition to other pertinent events   ?  5/4: Admit to ICU with worsening  SAH after emergent intubation on mechanical ventilatory support with plans to go urgently to OR for neurosurgery ?5/5: s/p craniotomy for TRAUMATIC SAH/subdural hematoma, remains on vent ?5/5: remains on  vent, family updated ?5/6: JP drain removed per Neurosurgery  ?5/6: MRI Brain revealed no ischemic infarction identified. Comparing the MRI to  ?     the most recent CT of earlier today, no change is appreciated. Sequela of   ?     hemorrhagic contusion of both frontal lobes and in the anterior left temporal  ?     lobe. Widespread subarachnoid blood and dependent intraventricular blood with  ?     stable ventricular size. Stable subdural blood along the medial aspect of the  ?     middle cranial fossa on the left, along the falx posteriorly and along the  ?     tentorium. ?5/6: CT Head revealed multifocal intracranial hemorrhage following trauma and  ?      surgical evacuation of left side subdural has not significantly changed since    ?      12/03/2021. Conspicuous cerebral edema throughout the left temporal lobe and ?       in both inferior frontal gyri is favored to be posttraumatic, and is at least in part  ?       due to hemorrhagic cerebral contusions. Mild rightward midline shift of 4-5 mm  ?       has slightly increased, but there is no other progressive intracranial mass effect.  ?       A moderate volume of intraventricular hemorrhage has increased, but ventricle  ?       size and configuration remains stable. ?5/8: Overnight propofol gtt started due to vent asynchrony overnight.  Pt unable to follow commands.   ?5/8: EEG suggestive of cortical dysfunction in left frontotemporal region likely secondary to underlying structural abnormality as well as moderate to severe diffuse encephalopathy, nonspecific to etiology.  No seizures or definite epileptiform discharges were seen throughout the recording. ?5/9: Propofol gtt stopped for WUA neurological exam remains unchanged unable to follow commands.  Pt  with increased work of breathing when sedation stopped.  Will diurese with 40 mg iv lasix x1 dose currently 2L positive  ? ?Interim History / Subjective:  ?Propofol gtt stopped for WUA pt unable to follow commands moving bilateral lower extremities and left upper extremity however no purposeful movement present and continues to have right sided hemiparesis.  Pt with increased work of breath and use off accessory muscles when sedation stopped. Pt remains on mechanical ventilation PEEP 5/FiO2 35% ? ?Objective   ?Blood pressure 123/70, pulse 83, temperature 99.3 ?F (37.4 ?C), resp. rate (!) 23, height '5\' 10"'$  (1.778 m), weight 88.2 kg, SpO2 98 %. ?   ?Vent Mode: PRVC ?FiO2 (%):  [35 %] 35 % ?Set Rate:  [12 bmp] 12 bmp ?Vt Set:  [500 mL] 500 mL ?PEEP:  [5 cmH20] 5 cmH20  ? ?Intake/Output Summary (Last 24 hours) at 11/22/2021 0826 ?Last data filed at 11/22/2021 0807 ?Gross per 24 hour  ?Intake 830.94 ml  ?Output 1600 ml  ?Net -769.06 ml  ? ?Filed Weights  ? 12/06/2021 0143 11/22/21 0500  ?Weight: 86.2 kg 88.2 kg  ? ? ?Examination: ?General: Acute on chronically ill appearing male, severe respiratory distress mechanical ventilation  ?HEENT: Left head incision site staples intact no drainage present, no JVD  ?Neuro: Sedated, not following commands, withdraws from  vigorous stimulation RLE/LLE/LUE, however RUE hemiparesis present, PERRL  ?CV: s1s2 RRR, NSR on monitor, no r/m/g ?Pulm: Rhonchi throughout, tachypnea with use of accessory muscles to breath  ?GI: soft, rounded, bs x 4 ?GU: external catheter in place with clear yellow urine ?Extremities: warm/dry, pulses + 2 R/P, trace generalized edema noted ? ?Resolved Hospital Problem list   ? ? ?Assessment & Plan:  ?Acute Hypoxic / Hypercapnic Respiratory Failure secondary to aspiration in the setting of Traumatic SAH ?Risk for Aspiration Pneumonia ?- Continue ventilator support & lung protective strategies ?- Wean PEEP & FiO2 as tolerated, maintain SpO2 > 90% ?- Head of bed elevated  30 degrees, VAP protocol in place ?- Plateau pressures less than 30 cm H20  ?- Intermittent chest x-ray & ABG PRN ?- Daily WUA with SBT as tolerated  ?- Ensure adequate pulmonary hygiene  ?- F/u cultures, tre

## 2021-11-22 NOTE — TOC Progression Note (Signed)
Transition of Care (TOC) - Progression Note  ? ? ?Patient Details  ?Name: Dustin Bumbaugh ?MRN: [id number] ?Date of Birth: 11/06/1940 ? ?Transition of Care (TOC) CM/SW Contact  ?Shelbie Hutching, RN ?Phone Number: ?11/22/2021, 11:02 AM ? ?Clinical Narrative:    ?ICU physicians would like to see if patient will qualify for [facility], RNCM reached out to both Raquel Sarna from [facility] and Kreamer with [facility] to evaluate for appropriateness.   ? ? ?Expected Discharge Plan:  (TBD) ?Barriers to Discharge: Continued Medical Work up ? ?Expected Discharge Plan and Services ?Expected Discharge Plan:  (TBD) ?  ?  ?  ?Living arrangements for the past 2 months: [place] ?                ?DME Arranged: N/A ?DME Agency: NA ?  ?  ?  ?HH Arranged: NA ?  ?  ?  ?  ? ? ?Social Determinants of Health (SDOH) Interventions ?  ? ?Readmission Risk Interventions ?   ? View : No data to display.  ?  ?  ?  ? ? ?

## 2021-11-22 NOTE — Progress Notes (Signed)
? ?   Attending Progress Note ? ?History: Azzam Mehra is a 81 y.o presenting after a fall presenting to the ER with confusion and headache. Was found to have [place] with left sided SDH. S/p left craniotomy for evacuation on 11/20/2021.  ? ?POD5: Continues to require intermittent sedation overnight. No significant changes.  ? ?POD4:requiring increased sedation overnight due to vent asynchrony.   ? ?POD3: Remains intubated.  Is more interactive today. ? ?POD2: Pt remains intubated.  He moves BUE and BLE more briskly on the L.  He is currently heavily sedated. ? ?POD1: Pt remains in the ICU on sedation, pressures, and intubated. Neurologically stable. Incision was bloody drainage yesterday evening. Was reinforced with additional staples without any continued issue.  ? ?Physical Exam: ?Vitals:  ? 11/22/21 0600 11/22/21 0700  ?BP: 134/78 123/70  ?Pulse: 92 83  ?Resp: (!) 29 (!) 23  ?Temp: 99 ?F (37.2 ?C) 99.3 ?F (37.4 ?C)  ?SpO2: (!) 88% 98%  ? ?Remains intubated ?PERRL. Facial grimace symmetric.  ?Withdrawals x 4 ?Incision c/d/I ? ?Data: ? ?Recent Labs  ?Lab 11/20/21 ?7017 11/21/21 ?7939 11/22/21 ?0606  ?NA 141 142 141  ?K 3.4* 3.3* 4.5  ?CL 105 106 106  ?CO2 '28 27 25  '$ ?BUN 19 24* 27*  ?CREATININE 0.51* 0.55* 0.62  ?GLUCOSE 178* 196* 211*  ?CALCIUM 7.7* 8.0* 8.6*  ? ? ?Recent Labs  ?Lab 12/03/2021 ?0003  ?AST 23  ?ALT 16  ?ALKPHOS 58  ? ?  ? Recent Labs  ?Lab 11/20/21 ?0300 11/21/21 ?9233 11/22/21 ?0606  ?WBC 12.3* 10.5 13.0*  ?HGB 9.0* 9.2* 12.0*  ?HCT 30.1* 30.5* 39.1  ?PLT 150 165 163  ? ? ?Recent Labs  ?Lab 12/08/2021 ?0142  ?INR 1.0  ? ?  ?   ? ? ?Other tests/results:  ?Head CT 11/20/2021 (post-op) ?IMPRESSION: ?1. Status post left convexity subdural hematoma evacuation with ?resolution of midline shift. ?2. Residual mixed subdural and subarachnoid blood surrounding the ?cerebellum and over the left convexity, but the dominant component ?of the subdural hematoma has markedly decreased. ?  ?  ?Electronically Signed ?  By:  Ulyses Jarred M.D. ?  On: 11/27/2021 19:03 ? ?MRI Brain 11/19/21 ?IMPRESSION: ?No ischemic infarction identified. Comparing the MRI to the most ?recent CT of earlier today, no change is appreciated. Sequela of ?hemorrhagic contusion of both frontal lobes and in the anterior left ?temporal lobe. Widespread subarachnoid blood and dependent ?intraventricular blood with stable ventricular size. Stable subdural ?blood along the medial aspect of the middle cranial fossa on the ?left, along the falx posteriorly and along the tentorium. ?  ?  ?Electronically Signed ?  By: Nelson Chimes M.D. ?  On: 11/19/2021 16:46 ? ?Assessment/Plan: ? ?Keimon Basaldua is a 81 y.o presenting after a fall resulting in tSAH and SDH s/p craniotomy for SDH evacuation on 12/11/2021. ? ?- Defer to CCU regarding extubation.  ?- Would continue to take sedation breaks and keep sedation and light as possible.  ?- continue Keppra '500mg'$  BID x 7 days ?- SBP<160 ?- OK for SQH for Dvt PPX ? ?Cooper Render PA-C ?Department of Neurosurgery ? ?  ?

## 2021-11-22 NOTE — Progress Notes (Signed)
PHARMACY CONSULT NOTE  ? ?Pharmacy Consult for Electrolyte Monitoring and Replacement  ? ?Recent Labs: ?Potassium (mmol/L)  ?Date Value  ?11/22/2021 4.5  ? ?Magnesium (mg/dL)  ?Date Value  ?11/22/2021 2.5 (H)  ? ?Calcium (mg/dL)  ?Date Value  ?11/22/2021 8.6 (L)  ? ?Albumin (g/dL)  ?Date Value  ?11/29/2021 3.7  ? ?Phosphorus (mg/dL)  ?Date Value  ?11/22/2021 5.0 (H)  ? ?Sodium (mmol/L)  ?Date Value  ?11/22/2021 141  ? ?Assessment: ?81 year old male with PMH of HTN, T2DM, ETOH use, HLD admitted with subarachnoid hemorrhage. S/p craniotomy. ? ?Nutrition: Vital HP at 65 mL/hr + free water flushes 30 mL q4h ? ?Goal of Therapy:  ?Electrolytes within normal limits ? ?Plan:  ?--K 3.3>4.5 after Kcl 40 mEq per tube x 1 and IV Kcl 10 mEq x 4 yesterday. ?WNL no repletion today, no further repletion ?--Follow-up electrolytes with AM labs tomorrow ? ?Shanon Brow Johann Santone ?11/22/2021 ?10:29 AM ?

## 2021-11-22 NOTE — Consult Note (Signed)
Pharmacy Antibiotic Note ? ?Michael Cohen is a 81 y.o. male admitted on 11/19/2021 with sepsis.  Pharmacy has been consulted for vancomycin & Zosyn dosing. ? ?Plan: ?F/u MRSA PCR ordered & cultures. ? ?Initiate Zosyn 3.375g IV q8h ?Give loading dose of Vancomycin 2g IV x1 (5/09 1600); followed by Vancomycin 1g IV q12h (5/10 0500>> ?Goal AUC 400-550  ?Est AUC: 459; Cmax: 28; Cmin: 13.2 ?SCr 0.8 (actual 0.62 rounded); IBW; Vd 0.72 ?Pt is also receiving Azithromycin '500mg'$  IV x1; then '250mg'$  IV q24h ? ?Height: '5\' 10"'$  (177.8 cm) ?Weight: 88.2 kg (194 lb 7.1 oz) ?IBW/kg (Calculated) : 73 ? ?Temp (24hrs), Avg:100.8 ?F (38.2 ?C), Min:99 ?F (37.2 ?C), Max:102.4 ?F (39.1 ?C) ? ?Recent Labs  ?Lab 11/18/2021 ?0003 11/18/21 ?0725 11/19/21 ?7121 11/19/21 ?9758 11/20/21 ?8325 11/21/21 ?4982 11/22/21 ?0606  ?WBC 8.2  --   --  10.8* 12.3* 10.5 13.0*  ?CREATININE 0.67   < > 0.52* 0.60* 0.51* 0.55* 0.62  ? < > = values in this interval not displayed.  ?  ?Estimated Creatinine Clearance: 83.8 mL/min (by C-G formula based on SCr of 0.62 mg/dL).   ? ?No Known Allergies ? ?Antimicrobials this admission: ?VAN/ZOS/Azith (5/9 >>  ? ?Dose adjustments this admission: ?CTM and adjust PRN ? ?Microbiology results: ?5/07 sputum: few GPRs, abundant citrobacter koseri (pending susceptibilities) ?5/08 Resp (trach): few GPCs (pending speciation) ?5/09 MRSA PCR: pending ?5/04 MRSA/Flu/Covid: negative ? ?Thank you for allowing pharmacy to be a part of this patient?s care. ? ?Michael Cohen ?11/22/2021 2:28 PM ? ?

## 2021-11-22 NOTE — Progress Notes (Signed)
?   11/22/21 1500  ?Clinical Encounter Type  ?Visited With Patient and family together  ?Visit Type Follow-up  ?Spiritual Encounters  ?Spiritual Needs Grief support;Emotional  ? ?Chaplain followed up with support. ?

## 2021-11-23 ENCOUNTER — Inpatient Hospital Stay: Payer: Self-pay

## 2021-11-23 ENCOUNTER — Ambulatory Visit: Payer: Medicare Other

## 2021-11-23 ENCOUNTER — Inpatient Hospital Stay: Payer: Medicare Other

## 2021-11-23 DIAGNOSIS — R569 Unspecified convulsions: Secondary | ICD-10-CM | POA: Diagnosis not present

## 2021-11-23 DIAGNOSIS — I609 Nontraumatic subarachnoid hemorrhage, unspecified: Secondary | ICD-10-CM | POA: Diagnosis not present

## 2021-11-23 DIAGNOSIS — R4182 Altered mental status, unspecified: Secondary | ICD-10-CM | POA: Diagnosis not present

## 2021-11-23 LAB — CBC WITH DIFFERENTIAL/PLATELET
Abs Immature Granulocytes: 0.19 10*3/uL — ABNORMAL HIGH (ref 0.00–0.07)
Basophils Absolute: 0 10*3/uL (ref 0.0–0.1)
Basophils Relative: 0 %
Eosinophils Absolute: 0.1 10*3/uL (ref 0.0–0.5)
Eosinophils Relative: 0 %
HCT: 36.4 % — ABNORMAL LOW (ref 39.0–52.0)
Hemoglobin: 11.3 g/dL — ABNORMAL LOW (ref 13.0–17.0)
Immature Granulocytes: 1 %
Lymphocytes Relative: 8 %
Lymphs Abs: 1.2 10*3/uL (ref 0.7–4.0)
MCH: 25.4 pg — ABNORMAL LOW (ref 26.0–34.0)
MCHC: 31 g/dL (ref 30.0–36.0)
MCV: 81.8 fL (ref 80.0–100.0)
Monocytes Absolute: 1.1 10*3/uL — ABNORMAL HIGH (ref 0.1–1.0)
Monocytes Relative: 7 %
Neutro Abs: 11.8 10*3/uL — ABNORMAL HIGH (ref 1.7–7.7)
Neutrophils Relative %: 84 %
Platelets: 295 10*3/uL (ref 150–400)
RBC: 4.45 MIL/uL (ref 4.22–5.81)
RDW: 16.6 % — ABNORMAL HIGH (ref 11.5–15.5)
WBC: 14.3 10*3/uL — ABNORMAL HIGH (ref 4.0–10.5)
nRBC: 0 % (ref 0.0–0.2)

## 2021-11-23 LAB — CULTURE, RESPIRATORY W GRAM STAIN: Gram Stain: NONE SEEN

## 2021-11-23 LAB — BASIC METABOLIC PANEL
Anion gap: 8 (ref 5–15)
BUN: 27 mg/dL — ABNORMAL HIGH (ref 8–23)
CO2: 27 mmol/L (ref 22–32)
Calcium: 8.4 mg/dL — ABNORMAL LOW (ref 8.9–10.3)
Chloride: 105 mmol/L (ref 98–111)
Creatinine, Ser: 0.64 mg/dL (ref 0.61–1.24)
GFR, Estimated: >60 mL/min (ref >60)
Glucose, Bld: 165 mg/dL — ABNORMAL HIGH (ref 70–99)
Potassium: 3.6 mmol/L (ref 3.5–5.1)
Sodium: 140 mmol/L (ref 135–145)

## 2021-11-23 LAB — GLUCOSE, CAPILLARY
Glucose-Capillary: 131 mg/dL — ABNORMAL HIGH (ref 70–99)
Glucose-Capillary: 141 mg/dL — ABNORMAL HIGH (ref 70–99)
Glucose-Capillary: 170 mg/dL — ABNORMAL HIGH (ref 70–99)
Glucose-Capillary: 188 mg/dL — ABNORMAL HIGH (ref 70–99)
Glucose-Capillary: 193 mg/dL — ABNORMAL HIGH (ref 70–99)
Glucose-Capillary: 278 mg/dL — ABNORMAL HIGH (ref 70–99)

## 2021-11-23 LAB — MAGNESIUM: Magnesium: 2.3 mg/dL (ref 1.7–2.4)

## 2021-11-23 LAB — PHOSPHORUS: Phosphorus: 3.9 mg/dL (ref 2.5–4.6)

## 2021-11-23 LAB — VITAMIN B12: Vitamin B-12: 139 pg/mL — ABNORMAL LOW (ref 180–914)

## 2021-11-23 IMAGING — MR MR HEAD WO/W CM
19 series · 48 of 48 positions shown · IV contrast (gadavist)
Comparison: [DATE]

CLINICAL DATA: Meningitis/CNS infection suspected, subdural
hematoma, parenchymal contusion, follow-up

EXAM:
MRI HEAD WITHOUT AND WITH CONTRAST
TECHNIQUE: Multiplanar, multiecho pulse sequences of the brain and surrounding
structures were obtained without and with intravenous contrast.
CONTRAST:  7.5mL GADAVIST GADOBUTROL 1 MMOL/ML IV SOLN

[Series 5: ax dwi_tracew · axial · 3.0mm · 1.80mm/px · z∈[-27,+123]mm · 3 of 48 slices shown]
[im 1/48]
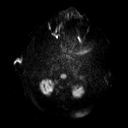
[im 24/48]
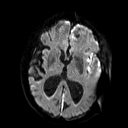
[im 48/48]
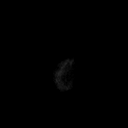

[Series 6: ax dwi_adc · axial · 3.0mm · 1.80mm/px · z∈[-27,+123]mm · 3 of 48 slices shown]
[im 1/48]
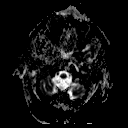
[im 24/48]
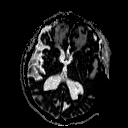
[im 48/48]
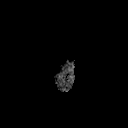

[Series 7: cor dwi_tracew · coronal · 5.0mm · 1.80mm/px · 2 of 38 slices shown]
[im 1/38]
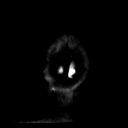
[im 38/38]
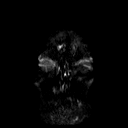

[Series 8: cor dwi_adc · coronal · 5.0mm · 1.80mm/px · 2 of 38 slices shown]
[im 1/38]
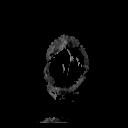
[im 38/38]
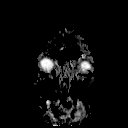

[Series 9: FLAIR · axial · 3.0mm · 0.69mm/px · z∈[-37,+120]mm · 2 of 55 slices shown (1 of 2)]
[im 1/55]
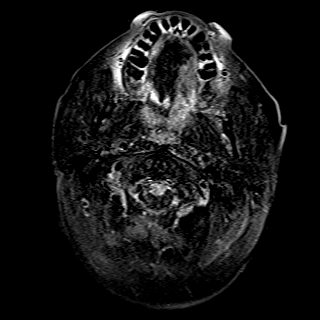
[im 55/55]
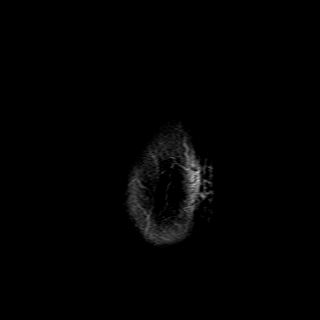

[Series 10: T1 · sagittal · 5.0mm · 0.94mm/px · 1 of 23 slices shown (1 of 4)]
[im 1/23]
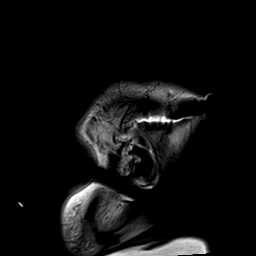

[Series 11: mag_images · axial · 3.0mm · 0.90mm/px · z∈[-37,+122]mm · 2 of 56 slices shown]
[im 1/56]
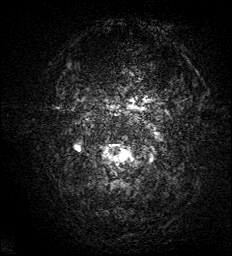
[im 56/56]
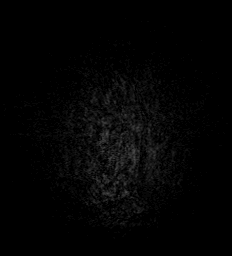

[Series 12: pha_images · axial · 3.0mm · 0.90mm/px · z∈[-37,+122]mm · 2 of 56 slices shown]
[im 1/56]
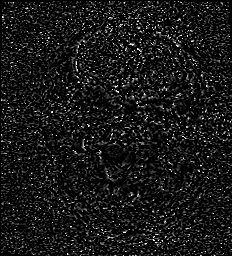
[im 56/56]
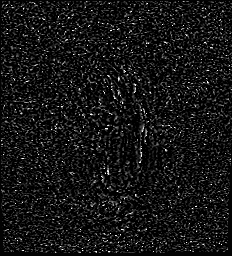

[Series 13: swi_images · axial · 3.0mm · 0.90mm/px · z∈[-37,+122]mm · 2 of 56 slices shown]
[im 1/56]
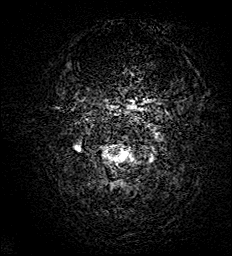
[im 56/56]
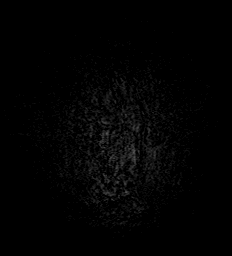

[Series 14: mip_images(sw) · axial · 24.0mm · 0.90mm/px · z∈[-27,+112]mm · 2 of 49 slices shown]
[im 1/49]
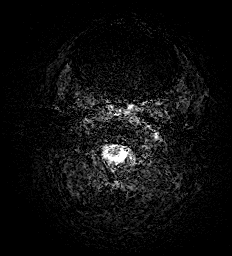
[im 49/49]
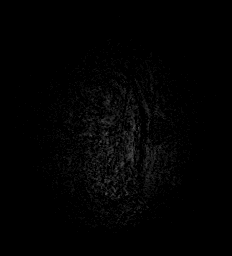

[Series 15: T2 · axial · 5.0mm · 0.86mm/px · 1 of 27 slices shown (1 of 2)]
[im 1/27]
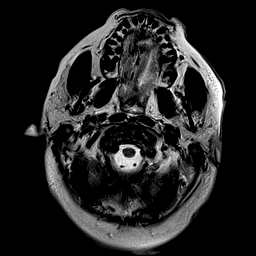

[Series 16: T1 · axial · 1.0mm · 0.98mm/px · z∈[-39,+130]mm · 7 of 176 slices shown (2 of 4)]
[im 1/176]
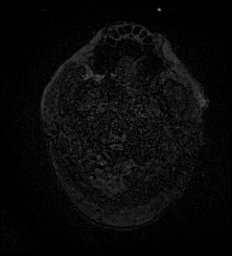
[im 30/176]
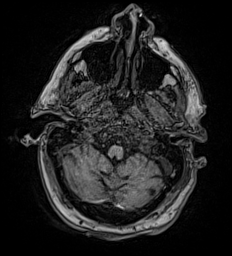
[im 59/176]
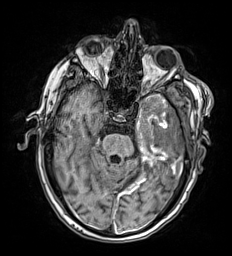
[im 88/176]
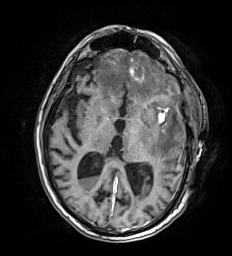
[im 117/176]
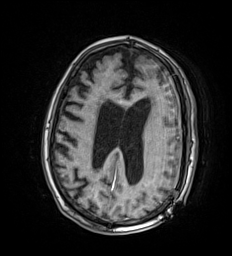
[im 146/176]
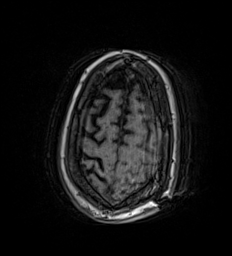
[im 176/176]
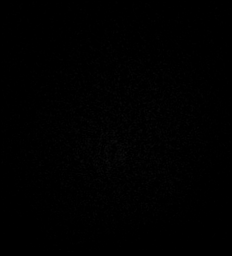

[Series 17: T2 · coronal · 3.0mm · 0.47mm/px · 1 of 35 slices shown (2 of 2)]
[im 1/35]
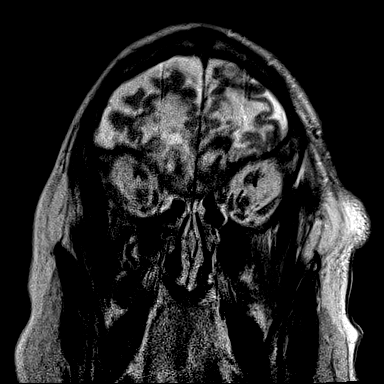

[Series 18: FLAIR · coronal · 3.0mm · 0.47mm/px · 1 of 35 slices shown (2 of 2)]
[im 1/35]
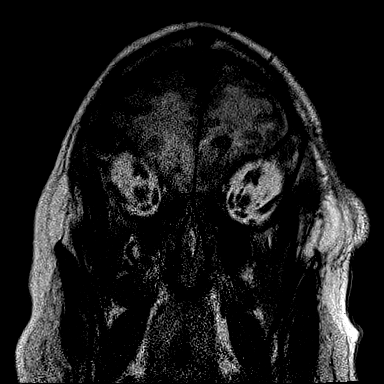

[Series 19: T1 · axial · 1.0mm · 0.98mm/px · z∈[-39,+130]mm · 7 of 176 slices shown (3 of 4)]
[im 1/176]
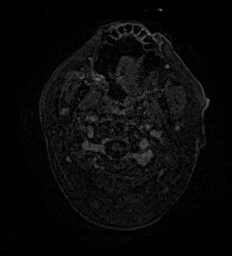
[im 30/176]
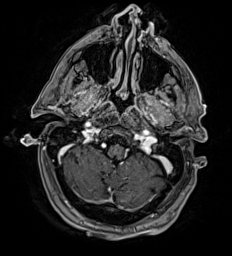
[im 59/176]
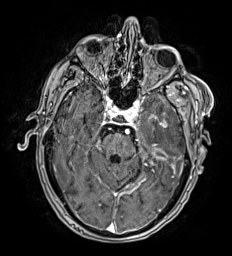
[im 88/176]
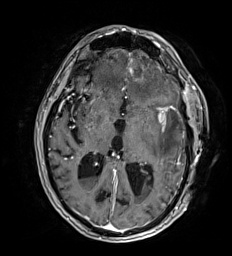
[im 117/176]
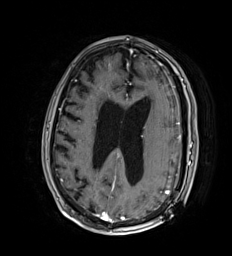
[im 146/176]
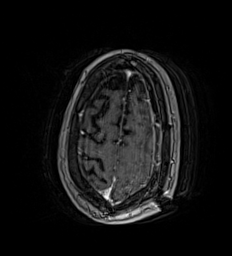
[im 176/176]
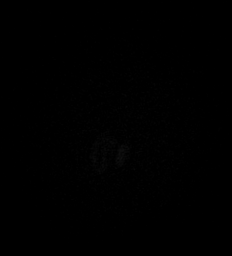

[Series 20: T1 post-contrast · coronal · 5.0mm · 0.90mm/px · 1 of 31 slices shown]
[im 1/31]
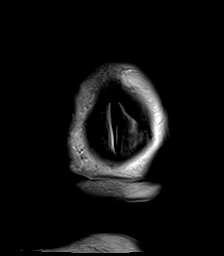

[Series 21: T1 · sagittal · 5.0mm · 0.94mm/px · 1 of 23 slices shown (4 of 4)]
[im 1/23]
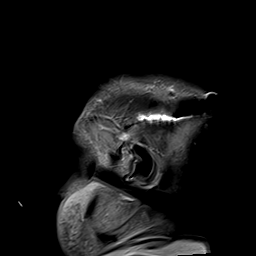

[Series 1040: cor thins rl · coronal · 3.0mm · 0.49mm/px · 4 of 107 slices shown (1 of 2)]
[im 1/107]
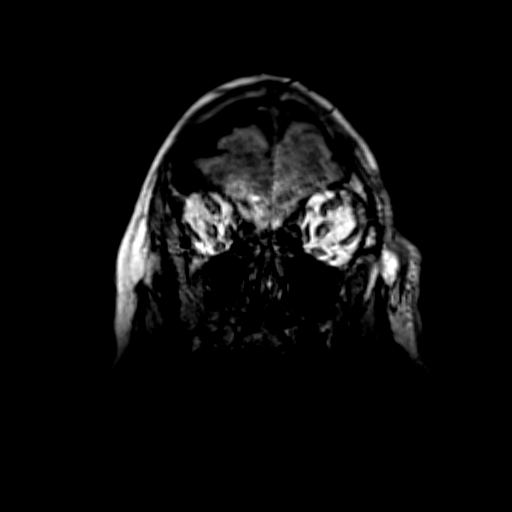
[im 36/107]
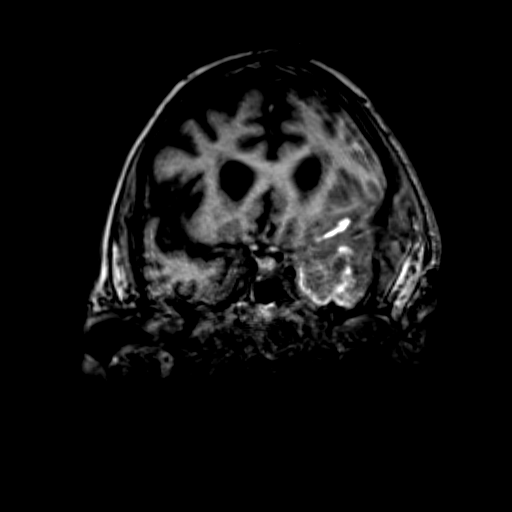
[im 71/107]
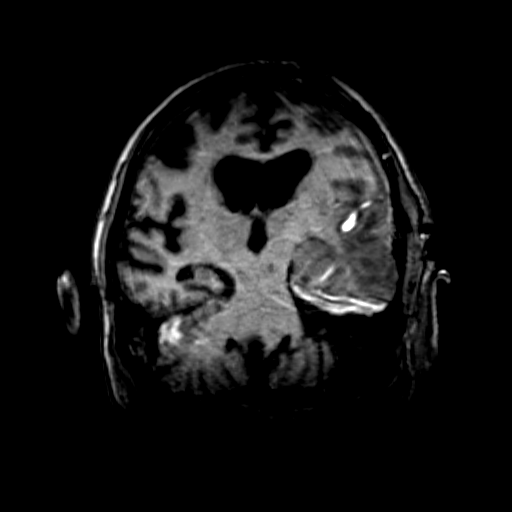
[im 107/107]
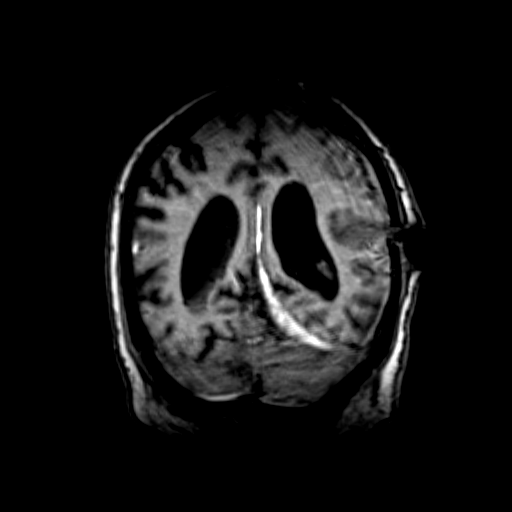

[Series 1049: cor thins rl · coronal · 3.0mm · 0.49mm/px · 4 of 107 slices shown (2 of 2)]
[im 1/107]
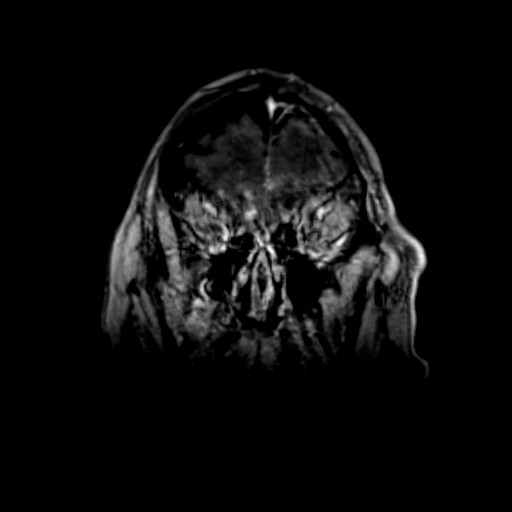
[im 36/107]
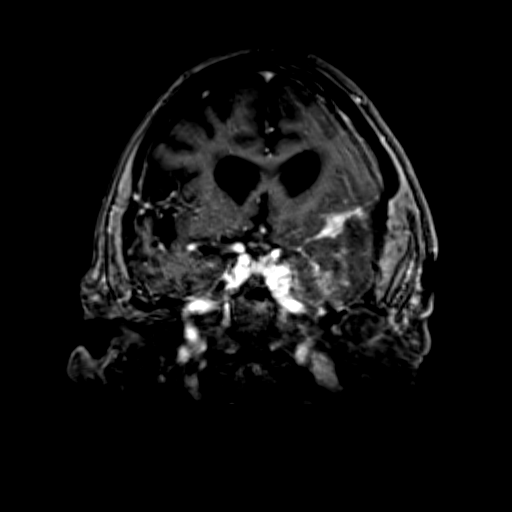
[im 71/107]
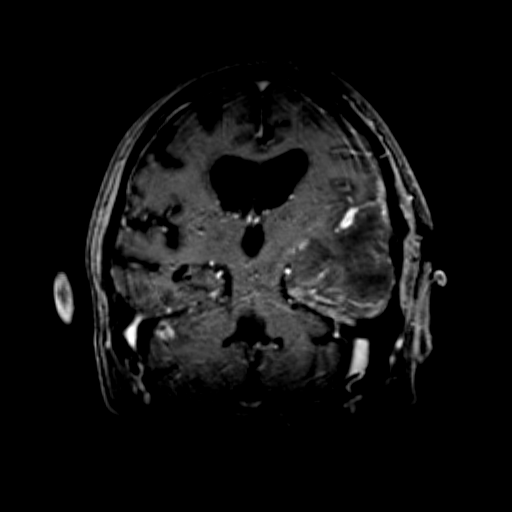
[im 107/107]
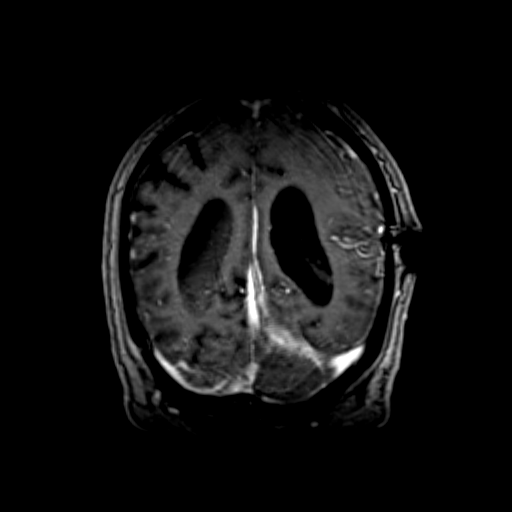

[48 of 48 positions shown; findings below may reference images not displayed]

FINDINGS: Brain: No restricted diffusion to suggest acute or subacute infarct.
Redemonstrated hemorrhagic contusion in the bilateral frontal lobes
and anterior left temporal lobe, with some intrinsic T1 hyperintense
signal surrounding the hematomas, consistent with evolving blood
products. No definite enhancement in these areas. Surrounding edema
is overall unchanged.

Again noted is widespread subarachnoid hemorrhage and diffuse
subdural hemorrhage, which is now more apparent, along the anterior
frontal lobes, anterior cranial fossa bilaterally, left middle
cranial fossa, left-greater-than-right occipital lobe, left
tentorium, and posterior falx. The largest subdural collection along
the medial aspect of the left middle cranial fossa measures up to 20
mm (series 19, image 70) previously 20 mm when remeasured similarly,
with unchanged mass effect on the left temporal lobe.

Hemorrhage is again noted layering in the occipital horns of the
lateral ventricles, with interval increase in the size of the
lateral and third ventricles.

No significant midline shift. No abnormal parenchymal or meningeal
enhancement. No peripherally enhancing collection.

Vascular: Patent arterial flow voids. Patent venous sinuses on
postcontrast imaging.

Skull and upper cervical spine: Prior left craniotomy.

Sinuses/Orbits: Normal paranasal sinuses. Status post bilateral lens
replacements.

Other: Fluid throughout the mastoid air cells.
IMPRESSION: 1. No definite empyema or other evidence of intracranial infection.
2. Interval enlargement of the lateral and third ventricles,
concerning for hydrocephalus.
3. Otherwise stable compared to the [DATE] MRI, with
redemonstrated hemorrhagic contusions in the bilateral frontal lobes
and anterior left temporal lobe, subdural, and subarachnoid
hemorrhage, which are now more apparent given evolving blood
products.

## 2021-11-23 MED ORDER — MIDAZOLAM BOLUS VIA INFUSION
0.2000 mg/kg | INTRAVENOUS | Status: DC | PRN
  Administered 2021-11-23: 17.6 mg via INTRAVENOUS
  Filled 2021-11-23: qty 18

## 2021-11-23 MED ORDER — SODIUM CHLORIDE 0.9% FLUSH: 10.0000 mL | INTRAVENOUS | Status: DC | PRN

## 2021-11-23 MED ORDER — MIDAZOLAM HCL 2 MG/2ML IJ SOLN
INTRAMUSCULAR | Status: AC
  Filled 2021-11-23: qty 2

## 2021-11-23 MED ORDER — VALPROATE SODIUM 100 MG/ML IV SOLN
2000.0000 mg | Freq: Once | INTRAVENOUS | Status: AC
  Administered 2021-11-23: 2000 mg via INTRAVENOUS
  Filled 2021-11-23: qty 20

## 2021-11-23 MED ORDER — MIDAZOLAM HCL 2 MG/2ML IJ SOLN
2.0000 mg | Freq: Once | INTRAMUSCULAR | Status: AC
  Administered 2021-11-23: 2 mg via INTRAVENOUS

## 2021-11-23 MED ORDER — AMIODARONE IV BOLUS ONLY 150 MG/100ML
INTRAVENOUS | Status: AC
  Administered 2021-11-23: 150 mg
  Filled 2021-11-23: qty 100

## 2021-11-23 MED ORDER — POTASSIUM CHLORIDE 20 MEQ PO PACK
40.0000 meq | PACK | Freq: Once | ORAL | Status: AC
  Administered 2021-11-23: 40 meq
  Filled 2021-11-23: qty 2

## 2021-11-23 MED ORDER — POTASSIUM CHLORIDE CRYS ER 20 MEQ PO TBCR
40.0000 meq | EXTENDED_RELEASE_TABLET | Freq: Once | ORAL | Status: DC
Start: 2021-11-23 — End: 2021-11-23

## 2021-11-23 MED ORDER — MIDAZOLAM HCL 2 MG/2ML IJ SOLN
0.2000 mg/kg | INTRAMUSCULAR | Status: DC | PRN
  Filled 2021-11-23: qty 17.6

## 2021-11-23 MED ORDER — PROPOFOL BOLUS VIA INFUSION
2.0000 mg/kg | INTRAVENOUS | Status: DC | PRN
  Administered 2021-11-23 (×4): 176 mg via INTRAVENOUS
  Filled 2021-11-23: qty 176

## 2021-11-23 MED ORDER — IPRATROPIUM-ALBUTEROL 0.5-2.5 (3) MG/3ML IN SOLN
3.0000 mL | Freq: Four times a day (QID) | RESPIRATORY_TRACT | Status: DC
  Administered 2021-11-23 – 2021-11-26 (×12): 3 mL via RESPIRATORY_TRACT
  Filled 2021-11-23 (×13): qty 3

## 2021-11-23 MED ORDER — VALPROATE SODIUM 100 MG/ML IV SOLN
10.0000 mg/kg/d | Freq: Four times a day (QID) | INTRAVENOUS | Status: DC
  Administered 2021-11-24 (×2): 183 mg via INTRAVENOUS
  Filled 2021-11-23 (×3): qty 1.83

## 2021-11-23 MED ORDER — SODIUM CHLORIDE 0.9 % IV SOLN
2500.0000 mg | Freq: Once | INTRAVENOUS | Status: AC
  Administered 2021-11-23: 2500 mg via INTRAVENOUS
  Filled 2021-11-23: qty 25

## 2021-11-23 MED ORDER — CYANOCOBALAMIN 1000 MCG/ML IJ SOLN
1000.0000 ug | Freq: Every day | INTRAMUSCULAR | Status: DC
Start: 2021-11-23 — End: 2021-11-28
  Administered 2021-11-23 – 2021-11-28 (×6): 1000 ug via INTRAMUSCULAR
  Filled 2021-11-23 (×6): qty 1

## 2021-11-23 MED ORDER — LEVETIRACETAM IN NACL 1500 MG/100ML IV SOLN
1500.0000 mg | Freq: Two times a day (BID) | INTRAVENOUS | Status: DC
  Administered 2021-11-23 – 2021-11-26 (×6): 1500 mg via INTRAVENOUS
  Filled 2021-11-23 (×6): qty 100

## 2021-11-23 MED ORDER — MIDAZOLAM HCL 2 MG/2ML IJ SOLN
2.0000 mg | INTRAMUSCULAR | Status: DC | PRN
  Administered 2021-11-23 (×2): 2 mg via INTRAVENOUS
  Filled 2021-11-23: qty 2

## 2021-11-23 MED ORDER — MIDAZOLAM HCL 2 MG/2ML IJ SOLN
2.0000 mg | Freq: Once | INTRAMUSCULAR | Status: DC
  Filled 2021-11-23: qty 2

## 2021-11-23 MED ORDER — SODIUM CHLORIDE 0.9% FLUSH
10.0000 mL | Freq: Two times a day (BID) | INTRAVENOUS | Status: DC
  Administered 2021-11-23 – 2021-11-28 (×10): 10 mL

## 2021-11-23 MED ORDER — VITAMIN B-12 1000 MCG PO TABS: 1000.0000 ug | ORAL_TABLET | Freq: Every day | ORAL | Status: DC

## 2021-11-23 MED ORDER — MIDAZOLAM-SODIUM CHLORIDE 100-0.9 MG/100ML-% IV SOLN
0.5000 mg/h | INTRAVENOUS | Status: DC
  Administered 2021-11-23: 0.5 mg/h via INTRAVENOUS
  Filled 2021-11-23 (×2): qty 100

## 2021-11-23 MED ORDER — PHENYLEPHRINE HCL-NACL 20-0.9 MG/250ML-% IV SOLN
0.0000 ug/min | INTRAVENOUS | Status: DC
  Filled 2021-11-23: qty 250

## 2021-11-23 MED ORDER — INSULIN ASPART 100 UNIT/ML IJ SOLN
12.0000 [IU] | Freq: Once | INTRAMUSCULAR | Status: AC
  Administered 2021-11-23: 12 [IU] via SUBCUTANEOUS
  Filled 2021-11-23: qty 1

## 2021-11-23 MED ORDER — GADOBUTROL 1 MMOL/ML IV SOLN
7.5000 mL | Freq: Once | INTRAVENOUS | Status: AC | PRN
  Administered 2021-11-23: 7.5 mL via INTRAVENOUS

## 2021-11-23 MED ORDER — LEVETIRACETAM IN NACL 1000 MG/100ML IV SOLN
1000.0000 mg | Freq: Two times a day (BID) | INTRAVENOUS | Status: DC
  Administered 2021-11-23: 1000 mg via INTRAVENOUS
  Filled 2021-11-23 (×2): qty 100

## 2021-11-23 NOTE — Progress Notes (Signed)
Pt having intermittent R lip/mouth twitching concerning for seizure activity.  Dr. Izora Ribas, [facility], notified.  Versed PRN given.  Possible EEG pending if activity persists.  Will monitor and update  ?

## 2021-11-23 NOTE — Progress Notes (Signed)
MRI brain personally reviewed, agree with radiology that ventricles are enlarging and otherwise imaging is stable with expected evolution of blood products and injury. ? ?Patient has continued to have intermittent right mouth twitching consistent with ongoing intermittent seizure activity.  He remains responsive to noxious stimulation on the left side upper extremity greater than the right, arguing against generalized seizure activity. ? ?Discussed with wife who notes she and son have decided patient would not want tracheostomy though otherwise they remain hopeful he will improve in time. We discussed that continuous EEG is not available at [facility] and there has been a staff shortage affecting availability today though I am still hopeful to obtain a spot EEG later today. She declined initiating transfer to [facility].  ? ?Recommendations ?- Initiating versed in addition to propofol for seizure control ?- Initiating depakote concurrently, 2000 mg loading dose, followed by 10 mg/kg/day divided q6hr maintenance  ?- Continue propofol  ?- Continue keppra 1500 mg BID ?- Will continue to follow ? ?Additional 40 minutes of critical care including review of imaging with family at bedside  ?

## 2021-11-23 NOTE — Progress Notes (Signed)
Dr. Gabrielle Dare T bedside ordered to 176 mg of Propofol.  Exceeds pump MD at bedside wanting this dose.  [place] at bedside gave 176 mg or 17.6 mg bolus.   ?

## 2021-11-23 NOTE — Progress Notes (Signed)
Pt was transported to MRI and back to CCU while on the vent. ?

## 2021-11-23 NOTE — Progress Notes (Signed)
Spoke to Dr. Vaughan Browner briefly about the R lip twitch episode earlier.  Upon reassessment pt still displays slight twitch.  Dr. Vaughan Browner notified of finding.  PRN '1mg'$  Versed given and twitch stopped. ?

## 2021-11-23 NOTE — Procedures (Addendum)
Patient Name: Michael Cohen  ?MRN: [id number]  ?Epilepsy Attending: Lora Havens  ?Referring Physician/Provider: Marshell Garfinkel, MD ?Date: 11/23/2021 ?Duration: 27.23 mins ? ?Patient history: 81 year old gentleman here with subdural hemorrhage with a past medical history significant for hypertension, hyperlipidemia, diabetes, daily drinking, now with persistent fever, worsening leukocytosis, and new seizure activity. EEG to evaluate for seizure ? ?Level of alertness: comatose ? ?AEDs during EEG study: LEV propofol,  versed ? ?Technical aspects: This EEG study was done with scalp electrodes positioned according to the 10-20 International system of electrode placement. Electrical activity was acquired at a sampling rate of '500Hz'$  and reviewed with a high frequency filter of '70Hz'$  and a low frequency filter of '1Hz'$ . EEG data were recorded continuously and digitally stored.  ? ?Description: EEG showed continuous generalized 3 to 6 Hz theta-delta slowing. There is also 13-'15hz'$  beta activity in left temporal region likely due to underlying breach artifact.  Hyperventilation and photic stimulation were not performed.    ? ?ABNORMALITY ?- Continuous slow, generalized ?- Breach artifact, left temporal region ? ?IMPRESSION: ?This study is suggestive of cortical dysfunction arising from left temporal region likely secondary to underlying craniotomy. There is also severe diffuse encephalopathy, nonspecific etiology but likely related to sedation. No seizures or epileptiform discharges were seen throughout the recording. ? ?Dr Curly Sh[place] was notified.  ? ? ?Michael Cohen  ? ?

## 2021-11-23 NOTE — Progress Notes (Signed)
Peripherally Inserted Central Catheter Placement ? ?The IV Nurse has discussed with the patient and/or persons authorized to consent for the patient, the purpose of this procedure and the potential benefits and risks involved with this procedure.  The benefits include less needle sticks, lab draws from the catheter, and the patient may be discharged home with the catheter. Risks include, but not limited to, infection, bleeding, blood clot (thrombus formation), and puncture of an artery; nerve damage and irregular heartbeat and possibility to perform a PICC exchange if needed/ordered by physician.  Alternatives to this procedure were also discussed.  Bard Power PICC patient education guide, fact sheet on infection prevention and patient information card has been provided to patient /or left at bedside. Placed by Audie Box RN.  ? ?PICC Placement Documentation  ?PICC Triple Lumen 11/23/21 Left Brachial 43 cm 0 cm (Active)  ?Indication for Insertion or Continuance of Line Prolonged intravenous therapies 11/23/21 2031  ?Exposed Catheter (cm) 0 cm 11/23/21 2031  ?Site Assessment Clean, Dry, Intact 11/23/21 2031  ?Lumen #1 Status Blood return noted;Flushed;Saline locked 11/23/21 2031  ?Lumen #2 Status Blood return noted;Flushed;Saline locked 11/23/21 2031  ?Lumen #3 Status Blood return noted;Flushed;Saline locked 11/23/21 2031  ?Dressing Type Transparent;Securing device 11/23/21 2031  ?Dressing Status Clean, Dry, Intact;Antimicrobial disc in place 11/23/21 2031  ?Safety Lock Not Applicable 02/15/22 3612  ?Line Care Connections checked and tightened 11/23/21 2031  ?Line Adjustment (NICU/IV Team Only) No 11/23/21 2031  ?Dressing Intervention New dressing 11/23/21 2031  ?Dressing Change Due 11/30/21 11/23/21 2031  ? ? ? ? ? ?Aldona Lento L ?11/23/2021, 8:35 PM ? ?

## 2021-11-23 NOTE — Consult Note (Addendum)
Neurology Consultation ?Reason for Consult: Seizures ?Requesting Physician: Freda Jackson ? ?CC: Fall w/ AMS ? ?History is obtained from: Wife and chart review as well as nursing a bedside  ? ?HPI: Michael Cohen is a 81 y.o. male with a past medical history significant for hypertension, hypercholesterolemia, diabetes, daily drinking (5 drinks a day approximately), neuropathy, who initially presented on 12/05/2021 after witnessed fall at home. ? ?He was found to have a subarachnoid hemorrhage on head CT, with worsening of his clinical condition at 5 AM prompting repeat scan which demonstrated a new left subdural hematoma with new midline shift and brain compression.  He was taken for this by Dr. Cari Caraway of neurosurgery for craniotomy and hematoma evacuation on 5/4 in the early a.m. since his surgery he has continued to be intubated and was gradually increasing in responsiveness over the first 3 days.  However on postoperative day 4 he required increased sedation due to vent asynchrony.  Additionally he has been intermittently febrile since 5/6 and was started on Zosyn, vancomycin and azithromycin on 5/9, with improvement in his fever curve.  His leukocytosis has also been increasing to a peak of 14.3 today (10.8 postoperatively).  In an attempt to obtain a better neurological examination, propofol was also weaned yesterday.  Subsequently he began to have right facial twitching and left gaze.  This would intermittently resolve with small doses of Versed (1 to 2 mg), Keppra was also increased from 500 mg twice daily to 1000 mg twice daily but he has had continued twitching for which neurology has been consulted. ? ?He did have an EEG completed on 5/8, which demonstrated continuous slow generalized but maximal in the left frontotemporal region without seizure activity. ? ?Wife at bedside denies any known history of atrial fibrillation, confirms his drinking history, and reports that he was in his usual state of  health prior to the fall without any recent health concerns other than worsening memory ? ?Recent antiseizure medication doses: ?Midaz 2 mg at 6 AM, 1 mg at 00:00, 4:39, 09:35 ?Keppra 500 mg BID last dose at 00:00, then increased to 1000 mg BID this AM ?Propofol weaned yesterday with seizure recurrence in this setting ? ?ROS: Unable to obtain due to altered mental status.  ? ?Past Medical History:  ?Diagnosis Date  ? Diabetes mellitus without complication ([place])   ? Hypercholesteremia   ? Hypertension   ? ?Past Surgical History:  ?Procedure Laterality Date  ? CATARACT EXTRACTION Bilateral   ? COLONOSCOPY WITH PROPOFOL N/A 12/12/2017  ? Procedure: COLONOSCOPY WITH PROPOFOL;  Surgeon: [name], [name], MD;  Location: [facility] ENDOSCOPY;  Service: Gastroenterology;  Laterality: N/A;  ? CRANIOTOMY Left 12/10/2021  ? Procedure: CRANIOTOMY HEMATOMA EVACUATION SUBDURAL;  Surgeon: Meade Maw, MD;  Location: [facility] ORS;  Service: Neurosurgery;  Laterality: Left;  ? EYE SURGERY    ? ?Current Outpatient Medications  ?Medication Instructions  ? acetaminophen (TYLENOL) 650 mg, Oral, Daily PRN  ? aspirin EC 81 mg, Oral, Every M-W-F  ? atorvastatin (LIPITOR) 40 mg, Oral, Every evening  ? cetirizine (ZYRTEC) 10 mg, Oral, Daily  ? Cholecalciferol 2,000 Units, Oral, Daily  ? Fish Oil 1,000 mg, Oral, Daily  ? gabapentin (NEURONTIN) 100 MG capsule 2 capsules, Oral, 2 times daily  ? lisinopril-hydrochlorothiazide (PRINZIDE,ZESTORETIC) 10-12.5 MG tablet 1 tablet, Oral, Daily  ? Lutein 6 mg, Oral, Daily  ? metFORMIN (GLUCOPHAGE-XR) 1,000 mg, Oral, 2 times daily with meals  ? Multiple Vitamin (MULTIVITAMIN) capsule 1 capsule, Oral, Daily  ?  omeprazole (PRILOSEC) 40 mg, Oral, Daily  ? omeprazole (PRILOSEC) 20 mg, Oral, Daily PRN  ? sitaGLIPtin (JANUVIA) 100 mg, Oral, Daily  ? ? ?Current Facility-Administered Medications:  ?   stroke: early stages of recovery book, , Does not apply, Once, Loleta Dicker, PA ?  acetaminophen (TYLENOL)  tablet 650 mg, 650 mg, Oral, Q4H PRN, 650 mg at 11/18/21 2109 **OR** acetaminophen (TYLENOL) 160 MG/5ML solution 650 mg, 650 mg, Per Tube, Q4H PRN, 650 mg at 11/22/21 1803 **OR** acetaminophen (TYLENOL) suppository 650 mg, 650 mg, Rectal, Q4H PRN, Loleta Dicker, PA, 650 mg at 11/20/21 2005 ?  atorvastatin (LIPITOR) tablet 40 mg, 40 mg, Per Tube, Daily, Loleta Dicker, [place], 40 mg at 11/23/21 7628 ?  [COMPLETED] azithromycin (ZITHROMAX) 500 mg in sodium chloride 0.9 % 250 mL IVPB, 500 mg, Intravenous, Q24H, Stopped at 11/22/21 1541 **AND** azithromycin (ZITHROMAX) 250 mg in dextrose 5 % 125 mL IVPB, 250 mg, Intravenous, Q24H, Dewald, Jonathan B, MD ?  chlorhexidine gluconate (MEDLINE KIT) (PERIDEX) 0.12 % solution 15 mL, 15 mL, Mouth Rinse, BID, Loleta Dicker, PA, 15 mL at 11/23/21 3151 ?  Chlorhexidine Gluconate Cloth 2 % PADS 6 each, 6 each, Topical, Daily, Flora Lipps, MD, 6 each at 11/22/21 1043 ?  cholecalciferol (VITAMIN D3) tablet 2,000 Units, 2,000 Units, Per Tube, Daily, Loleta Dicker, [place], 2,000 Units at 11/23/21 7616 ?  dexmedetomidine (PRECEDEX) 400 MCG/100ML (4 mcg/mL) infusion, 0.4-1.2 mcg/kg/hr, Intravenous, Titrated, Teressa Lower, NP, Stopped at 11/23/21 [phone] ?  docusate (COLACE) 50 MG/5ML liquid 100 mg, 100 mg, Per Tube, BID, Loleta Dicker, PA, 100 mg at 11/23/21 1062 ?  feeding supplement (PROSource TF) liquid 45 mL, 45 mL, Per Tube, QID, Freddi Starr, MD, 45 mL at 11/23/21 6948 ?  feeding supplement (VITAL 1.5 CAL) liquid 1,000 mL, 1,000 mL, Per Tube, Continuous, Freddi Starr, MD, Last Rate: 55 mL/hr at 11/23/21 0634, 1,000 mL at 11/23/21 0634 ?  fentaNYL (SUBLIMAZE) injection 25-50 mcg, 25-50 mcg, Intravenous, Q2H PRN, Teressa Lower, NP, 50 mcg at 11/22/21 5462 ?  folic acid (FOLVITE) tablet 1 mg, 1 mg, Per Tube, Daily, Loleta Dicker, PA, 1 mg at 11/23/21 7035 ?  free water 50 mL, 50 mL, Per Tube, Q4H, Freda Jackson B, MD, 50 mL at 11/23/21 0924 ?   heparin injection 5,000 Units, 5,000 Units, Subcutaneous, Q8H, Freddi Starr, MD, 5,000 Units at 11/23/21 0506 ?  insulin aspart (novoLOG) injection 3-9 Units, 3-9 Units, Subcutaneous, Q4H, Rust-Chester, Huel Cote, NP, 3 Units at 11/23/21 0093 ?  levETIRAcetam (KEPPRA) IVPB 1000 mg/100 mL premix, 1,000 mg, Intravenous, Q12H, Darel Hong D, NP, Last Rate: 400 mL/hr at 11/23/21 0923, 1,000 mg at 11/23/21 8182 ?  loratadine (CLARITIN) tablet 10 mg, 10 mg, Per Tube, Daily, Loleta Dicker, PA, 10 mg at 11/23/21 9937 ?  MEDLINE mouth rinse, 15 mL, Mouth Rinse, 10 times per day, Loleta Dicker, PA, 15 mL at 11/23/21 1696 ?  midazolam (VERSED) injection 1 mg, 1 mg, Intravenous, Q2H PRN, Loleta Dicker, PA, 1 mg at 11/23/21 0935 ?  multivitamin with minerals tablet 1 tablet, 1 tablet, Per Tube, Daily, Loleta Dicker, PA, 1 tablet at 11/23/21 7893 ?  omega-3 acid ethyl esters (LOVAZA) capsule 1 g, 1 g, Per Tube, Daily, Loleta Dicker, PA ?  ondansetron [facility]) injection 4 mg, 4 mg, Intravenous, Q4H PRN, Loleta Dicker, PA ?  oxyCODONE-acetaminophen (PERCOCET/ROXICET) 5-325 MG per tablet 1-2  tablet, 1-2 tablet, Per Tube, Q6H PRN, Teressa Lower, NP ?  pantoprazole sodium (PROTONIX) 40 mg/20 mL oral suspension 40 mg, 40 mg, Per Tube, Daily, Loleta Dicker, PA, 40 mg at 11/23/21 5789 ?  piperacillin-tazobactam (ZOSYN) IVPB 3.375 g, 3.375 g, Intravenous, Q8H, Beers, Shanon Brow, RPH, Last Rate: 12.5 mL/hr at 11/23/21 0820, Infusion Verify at 11/23/21 0820 ?  polyethylene glycol (MIRALAX / GLYCOLAX) packet 17 g, 17 g, Per Tube, Daily, Loleta Dicker, [place], 17 g at 11/23/21 7847 ?  propofol (DIPRIVAN) 1000 MG/100ML infusion, 5-80 mcg/kg/min, Intravenous, Titrated, Teressa Lower, NP, Stopped at 11/22/21 [phone] ?  [EXPIRED] thiamine (B-1) 250 mg in sodium chloride 0.9 % 50 mL IVPB, 250 mg, Intravenous, Daily, Stopped at 11/19/21 1016 **FOLLOWED BY** thiamine (B-1) injection 100 mg, 100 mg,  Intravenous, Daily, Loleta Dicker, PA, 100 mg at 11/23/21 8208 ?  traZODone (DESYREL) tablet 25 mg, 25 mg, Per Tube, QHS PRN, Loleta Dicker, PA, 25 mg at 11/18/21 2123 ?  vancomycin (VANCOCIN) IVPB 1000 mg/

## 2021-11-23 NOTE — Progress Notes (Signed)
? ?   Attending Progress Note ? ?History: Michael Cohen is a 81 y.o presenting after a fall presenting to the ER with confusion and headache. Was found to have Remy with left sided SDH. S/p left craniotomy for evacuation on 11/19/2021.  ? ?POD6: pt less responsive this morning. Right facial twitching present this am. ? ?POD5: Continues to require intermittent sedation overnight. No significant changes.  ? ?POD4:requiring increased sedation overnight due to vent asynchrony.   ? ?POD3: Remains intubated.  Is more interactive today. ? ?POD2: Pt remains intubated.  He moves BUE and BLE more briskly on the L.  He is currently heavily sedated. ? ?POD1: Pt remains in the ICU on sedation, pressures, and intubated. Neurologically stable. Incision was bloody drainage yesterday evening. Was reinforced with additional staples without any continued issue.  ? ?Physical Exam: ?Vitals:  ? 11/23/21 0311 11/23/21 0400  ?BP:  128/82  ?Pulse:  81  ?Resp:  19  ?Temp:  99.7 ?F (37.6 ?C)  ?SpO2: 97% 99%  ? ?Remains intubated ?PERRL. Facial grimace symmetric.  ?Withdrawals x 4 ?Incision c/d/I ? ?Data: ? ?Recent Labs  ?Lab 11/20/21 ?9604 11/21/21 ?5409 11/22/21 ?0606  ?NA 141 142 141  ?K 3.4* 3.3* 4.5  ?CL 105 106 106  ?CO2 '28 27 25  '$ ?BUN 19 24* 27*  ?CREATININE 0.51* 0.55* 0.62  ?GLUCOSE 178* 196* 211*  ?CALCIUM 7.7* 8.0* 8.6*  ? ? ?Recent Labs  ?Lab 11/26/2021 ?0003  ?AST 23  ?ALT 16  ?ALKPHOS 58  ? ?  ? Recent Labs  ?Lab 11/20/21 ?8119 11/21/21 ?1478 11/22/21 ?0606  ?WBC 12.3* 10.5 13.0*  ?HGB 9.0* 9.2* 12.0*  ?HCT 30.1* 30.5* 39.1  ?PLT 150 165 163  ? ? ?Recent Labs  ?Lab 12/09/2021 ?0142  ?INR 1.0  ? ?  ?   ? ? ?Other tests/results:  ?Head CT 12/04/2021 (post-op) ?IMPRESSION: ?1. Status post left convexity subdural hematoma evacuation with ?resolution of midline shift. ?2. Residual mixed subdural and subarachnoid blood surrounding the ?cerebellum and over the left convexity, but the dominant component ?of the subdural hematoma has markedly  decreased. ?  ?  ?Electronically Signed ?  By: Ulyses Jarred M.D. ?  On: 12/07/2021 19:03 ? ?MRI Brain 11/19/21 ?IMPRESSION: ?No ischemic infarction identified. Comparing the MRI to the most ?recent CT of earlier today, no change is appreciated. Sequela of ?hemorrhagic contusion of both frontal lobes and in the anterior left ?temporal lobe. Widespread subarachnoid blood and dependent ?intraventricular blood with stable ventricular size. Stable subdural ?blood along the medial aspect of the middle cranial fossa on the ?left, along the falx posteriorly and along the tentorium. ?  ?  ?Electronically Signed ?  By: Nelson Chimes M.D. ?  On: 11/19/2021 16:46 ? ?Assessment/Plan: ? ?Maher Shon is a 81 y.o presenting after a fall resulting in tSAH and SDH s/p craniotomy for SDH evacuation on 11/18/2021. ? ?- agree with obtaining EEG ?- Will order a repeat CT head due to changes in neuro status. ?- Defer to CCU regarding extubation.  ?- Would continue to take sedation breaks and keep sedation and light as possible.  ?- continue Keppra '500mg'$  BID x 7 days ?- SBP<160 ?- OK for SQH for Dvt PPX ? ?Cooper Render PA-C ?Department of Neurosurgery ? ?  ?

## 2021-11-23 NOTE — Progress Notes (Signed)
eLink Physician-Brief Progress Note ?Patient Name: Michael Cohen ?DOB: 1940/08/15 ?MRN: [id number] ? ? ?Date of Service ? 11/23/2021  ?HPI/Events of Note ? Blood sugar 278 mg / dl, which exceeds the maximum level under his current coverage scale.  ?eICU Interventions ? Novolog 12 units SQ x I ordered.  ? ? ? ?  ? ?Frederik Pear ?11/23/2021, 1:22 AM ?

## 2021-11-23 NOTE — Progress Notes (Signed)
EEG complete - results pending 

## 2021-11-23 NOTE — Plan of Care (Signed)

## 2021-11-23 NOTE — Progress Notes (Signed)
PCCM note ? ?Notified by the nurse that patient had a right lip twitch which improved with Versed earlier in the night and it recurred again today morning.  Continue benzos PRN ?Order EEG to rule out seizure ? ?Marshell Garfinkel MD ?[facility] ?See Amion for pager ? ?If no response to pager , please call [phone] until 7pm ?After 7:00 pm call Elink  [phone] ?11/23/2021, 5:02 AM  ? ? ?

## 2021-11-23 NOTE — Progress Notes (Signed)
PHARMACY CONSULT NOTE  ? ?Pharmacy Consult for Electrolyte Monitoring and Replacement  ? ?Recent Labs: ?Potassium (mmol/L)  ?Date Value  ?11/23/2021 3.6  ? ?Magnesium (mg/dL)  ?Date Value  ?11/23/2021 2.3  ? ?Calcium (mg/dL)  ?Date Value  ?11/23/2021 8.4 (L)  ? ?Albumin (g/dL)  ?Date Value  ?12/12/2021 3.7  ? ?Phosphorus (mg/dL)  ?Date Value  ?11/23/2021 3.9  ? ?Sodium (mmol/L)  ?Date Value  ?11/23/2021 140  ? ?Assessment: ?81 year old male with PMH of HTN, T2DM, ETOH use, HLD admitted with subarachnoid hemorrhage. S/p craniotomy. ? ?Nutrition: Vital HP at 65>55 mL/hr (1.3L) + free water flushes 30>50 mL q4h (383m) ? ?Goal of Therapy:  ?Electrolytes within normal limits ? ?Plan:  ?Scr stable baseline; had lasix '40mg'$  IV x1 and UOP from 0.7>1.668mk/h [Net +2L] ?K 4.5>3.6  ?Currently WNL not requiring repletion at this time. ?Follow-up electrolytes with AM labs tomorrow ? ?BrLorna DibblePharmD, BCCP ?Clinical Pharmacist ?11/23/2021 8:43 AM ? ?

## 2021-11-23 NOTE — Progress Notes (Signed)
? ?NAME:  Armanii Pressnell, MRN:  [id number], DOB:  1941/06/14, LOS: 6 ?ADMISSION DATE:  11/19/2021, CONSULTATION DATE:  11/19/2021 ?REFERRING MD:  Dr. Sidney Ace, CHIEF COMPLAINT: Fall   ? ?History of Present Illness:  ?81 year old male presenting to [facility] ED from [facility] on 12/12/2021 via EMS.  Per the patient's wife, he had gone out to walk the dog that evening and had fallen in the entryway hitting his head on the corner of the baseboard.  She reported he was altered post fall with complaints of a headache. Patient is not on systemic blood thinners but does take a baby aspirin every other day for heart health.  Wife confirms that the patient drinks 2 scotches daily and sometimes wine with dinner, unclear the exact amount of scotch.  She denies any other recent complaints.  No reports of witnessed seizure-like activity and the patient did confirm that he had drinking alcohol that evening. ?ED course: ?Head CT showed large subarachnoid hemorrhage.  Follow-up CTA revealed no aneurysm.  EDP spoke with Dr. Cari Caraway with neurosurgery who initially recommended admitting to hospitalist service with repeat CT in 6 hours.  TRH admitted patient to stepdown unit.  However, patient still boarding in ED when he became agitated attempting to get out of bed and then vomited.  At this point EDP described patient as having gurgling respirations with SPO2 83% on room air.  Patient was emergently intubated requiring mechanical ventilatory support.  Stat repeat head CT revealed new subdural bleed with 7 mm midline shift.  Dr. Cari Caraway from neurosurgery made aware of change, discussed options bedside with wife and the decision was made to take the patient urgently to the OR and transferred directly to ICU. ?medications given: Keppra 1000 mg, mannitol 50 g, IV contrast, fentanyl, 1 L NS, Tdap, banana bag, etomidate, succinylcholine, propofol drip started ?Initial Vitals: 97.3 F, 17, 62, 117/67 and SPO2 95% on room air ?Significant labs:  (Labs/ Imaging personally reviewed) ?I, Domingo Pulse Rust-Chester, AGACNP-BC, personally viewed and interpreted this ECG. ?EKG Interpretation: Date: 12/03/2021, EKG Time: 23: 55, Rate: 63, Rhythm: First-degree heart block, QRS Axis: Normal,  ?Intervals: First-degree heart block, borderline prolonged QTc, ST/T Wave abnormalities: None, Narrative Interpretation: NSR with first-degree heart block ?Chemistry: Na+:139, K+: 3.3, BUN/Cr.: 8/0.67, Serum CO2/ AG: 24/13 ?Hematology: WBC: 8.2, Hgb: 12.5,  ?Troponin: 6 >5, COVID-19 & Influenza A/B: pending ?ABG: pending ?CXR 12/05/2021: Low lung volumes with probable areas of passive subsegmental atelectasis, as above ?CT head Wo contrast 11/20/2021: Diffuse subarachnoid hemorrhage as described above concentrated predominately in the left sylvian fissure. 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 CTA of the head is ?recommended for further evaluation. Chronic atrophic and ischemic changes are noted ?CT cervical spine 12/07/2021: Degenerative change without acute abnormality ?CT angio head 12/01/2021: No aneurysm, occlusion or high-grade stenosis of the intracranial arteries ?CT head wo contrast 12/01/2021: New subdural hematoma along the majority of the left cerebral convexity measuring up to 1 cm in thickness and primarily responsible for new 7 mm midline shift. Some thickening of subarachnoid hemorrhage although stable pattern. Trace subdural hematoma along the anterior right frontal convexity. Early visualization of cerebral contusion in the bilateral inferior frontal and left temporal lobes. ? ?PCCM consulted due to deterioration requiring emergent intubation and mechanical ventilatory support. ? 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 ? ?Micro data:  ?5/4: SARS-CoV-2 and influenza PCR>> negative ?5/4: MRSA PCR>> negative ?5/7: Tracheal  aspirate>>CITROBACTER KOSERI (pansensitive) ?5/8:  Tracheal aspirate>>CITROBACTER KOSERI  ?5/9: MRSA PCR>> negative ? ?Antimicrobials:  ?Unasyn 5/4>> 5/5 ?Azithromycin 5/9 x 1 dose ?Vancomycin 5/9 x 1 dose ?Zosyn 5/9>> ? ?Significant Hospital Events: ?Including procedures, antibiotic start and stop dates in addition to other pertinent events   ?5/4: Admit to ICU with worsening SAH after emergent intubation on mechanical ventilatory support with plans to go urgently to OR for neurosurgery ?5/5: s/p craniotomy for TRAUMATIC SAH/subdural hematoma, remains on vent ?5/5: remains on  vent, family updated ?5/6: JP drain removed per Neurosurgery  ?5/6: MRI Brain revealed no ischemic infarction identified. Comparing the MRI to  ?     the most recent CT of earlier today, no change is appreciated. Sequela of   ?     hemorrhagic contusion of both frontal lobes and in the anterior left temporal  ?     lobe. Widespread subarachnoid blood and dependent intraventricular blood with  ?     stable ventricular size. Stable subdural blood along the medial aspect of the  ?     middle cranial fossa on the left, along the falx posteriorly and along the  ?     tentorium. ?5/6: CT Head revealed multifocal intracranial hemorrhage following trauma and  ?      surgical evacuation of left side subdural has not significantly changed since    ?      11/20/2021. Conspicuous cerebral edema throughout the left temporal lobe and ?       in both inferior frontal gyri is favored to be posttraumatic, and is at least in part  ?       due to hemorrhagic cerebral contusions. Mild rightward midline shift of 4-5 mm  ?       has slightly increased, but there is no other progressive intracranial mass effect.  ?       A moderate volume of intraventricular hemorrhage has increased, but ventricle  ?       size and configuration remains stable. ?5/8: Overnight propofol gtt started due to vent asynchrony overnight.  Pt unable to follow commands.   ?5/8: EEG suggestive of cortical dysfunction in left frontotemporal  region likely secondary to underlying structural abnormality as well as moderate to severe diffuse encephalopathy, nonspecific to etiology.  No seizures or definite epileptiform discharges were seen throughout the recording. ?5/9: Propofol gtt stopped for W[facility] neurological exam remains unchanged unable to follow commands.  Pt with increased work of breathing when sedation stopped.  Will diurese with 40 mg iv lasix x1 dose currently 2L positive  ?5/10: Patient with recurrent right lip/mouth twitching concerning for seizures.  EEG, MRI pending.  Consult neurology ? ?Interim History / Subjective:  ?-Overnight with recurrent right lip twitching (responsive to Versed) and low-grade fever with Tmax 100.6 ?-Neuro exam unchanged, currently on Precedex ~mental status currently precludes extubation ?-This morning again with recurrent right lip twitching concerning for seizure activity ~will need to resume propofol ?-Will increase Keppra dose to 1000 mg twice daily, neurology consulted who is ordered for Keppra bolus ?-EEG and MRI pending ?-Mild leukocytosis with WBC 14.3 K (13.0 yesterday) ?-Tracheal aspirate with Citrobacter koseri ~antibiotics narrowed to Zosyn ?-Urine output 3.4 L yesterday with diuresis (net +1.9 L since admit) ? ?Objective   ?Blood pressure 127/83, pulse 83, temperature (!) 100.4 ?F (38 ?C), resp. rate (!) 25, height '5\' 10"'$  (1.778 m), weight 88 kg, SpO2 97 %. ?   ?Vent Mode: PRVC ?FiO2 (%):  [35 %]  35 % ?Set Rate:  [12 bmp] 12 bmp ?Vt Set:  [500 mL] 500 mL ?PEEP:  [5 cmH20-8 cmH20] 8 cmH20  ? ?Intake/Output Summary (Last 24 hours) at 11/23/2021 0736 ?Last data filed at 11/23/2021 0076 ?Gross per 24 hour  ?Intake 3296.51 ml  ?Output 3475 ml  ?Net -178.49 ml  ? ? ?Filed Weights  ? 11/30/2021 0143 11/22/21 0500 11/23/21 0500  ?Weight: 86.2 kg 88.2 kg 88 kg  ? ? ?Examination: ?General: Acute on chronically ill appearing male, on the vent, no acute distress ?HEENT: Left head incision site staples intact no  drainage present, no JVD  ?Neuro: Sedated, not following commands, withdraws from vigorous stimulation RLE/LLE/LUE, however RUE hemiparesis present, PERRL  ?CV: s1s2 RRR, NSR on monitor, no r/m/g ?Pulm: Coarse rhonchi th

## 2021-11-24 ENCOUNTER — Inpatient Hospital Stay: Payer: Medicare Other

## 2021-11-24 ENCOUNTER — Other Ambulatory Visit: Payer: Medicare Other

## 2021-11-24 DIAGNOSIS — Z7189 Other specified counseling: Secondary | ICD-10-CM | POA: Diagnosis not present

## 2021-11-24 DIAGNOSIS — I609 Nontraumatic subarachnoid hemorrhage, unspecified: Secondary | ICD-10-CM | POA: Diagnosis not present

## 2021-11-24 LAB — GLUCOSE, CAPILLARY
Glucose-Capillary: 120 mg/dL — ABNORMAL HIGH (ref 70–99)
Glucose-Capillary: 136 mg/dL — ABNORMAL HIGH (ref 70–99)
Glucose-Capillary: 137 mg/dL — ABNORMAL HIGH (ref 70–99)
Glucose-Capillary: 146 mg/dL — ABNORMAL HIGH (ref 70–99)
Glucose-Capillary: 147 mg/dL — ABNORMAL HIGH (ref 70–99)
Glucose-Capillary: 149 mg/dL — ABNORMAL HIGH (ref 70–99)
Glucose-Capillary: 150 mg/dL — ABNORMAL HIGH (ref 70–99)
Glucose-Capillary: 157 mg/dL — ABNORMAL HIGH (ref 70–99)
Glucose-Capillary: 164 mg/dL — ABNORMAL HIGH (ref 70–99)
Glucose-Capillary: 168 mg/dL — ABNORMAL HIGH (ref 70–99)
Glucose-Capillary: 172 mg/dL — ABNORMAL HIGH (ref 70–99)
Glucose-Capillary: 179 mg/dL — ABNORMAL HIGH (ref 70–99)
Glucose-Capillary: 187 mg/dL — ABNORMAL HIGH (ref 70–99)
Glucose-Capillary: 206 mg/dL — ABNORMAL HIGH (ref 70–99)
Glucose-Capillary: 228 mg/dL — ABNORMAL HIGH (ref 70–99)
Glucose-Capillary: 236 mg/dL — ABNORMAL HIGH (ref 70–99)
Glucose-Capillary: 251 mg/dL — ABNORMAL HIGH (ref 70–99)
Glucose-Capillary: 254 mg/dL — ABNORMAL HIGH (ref 70–99)
Glucose-Capillary: 259 mg/dL — ABNORMAL HIGH (ref 70–99)

## 2021-11-24 LAB — MAGNESIUM: Magnesium: 2.5 mg/dL — ABNORMAL HIGH (ref 1.7–2.4)

## 2021-11-24 LAB — HEPATIC FUNCTION PANEL
ALT: 92 U/L — ABNORMAL HIGH (ref 0–44)
AST: 60 U/L — ABNORMAL HIGH (ref 15–41)
Albumin: 1.9 g/dL — ABNORMAL LOW (ref 3.5–5.0)
Alkaline Phosphatase: 81 U/L (ref 38–126)
Bilirubin, Direct: 0.3 mg/dL — ABNORMAL HIGH (ref 0.0–0.2)
Indirect Bilirubin: 0.3 mg/dL (ref 0.3–0.9)
Total Bilirubin: 0.6 mg/dL (ref 0.3–1.2)
Total Protein: 5.2 g/dL — ABNORMAL LOW (ref 6.5–8.1)

## 2021-11-24 LAB — BASIC METABOLIC PANEL
Anion gap: 9 (ref 5–15)
BUN: 25 mg/dL — ABNORMAL HIGH (ref 8–23)
CO2: 28 mmol/L (ref 22–32)
Calcium: 8.3 mg/dL — ABNORMAL LOW (ref 8.9–10.3)
Chloride: 107 mmol/L (ref 98–111)
Creatinine, Ser: 0.54 mg/dL — ABNORMAL LOW (ref 0.61–1.24)
GFR, Estimated: >60 mL/min (ref >60)
Glucose, Bld: 198 mg/dL — ABNORMAL HIGH (ref 70–99)
Potassium: 3 mmol/L — ABNORMAL LOW (ref 3.5–5.1)
Sodium: 144 mmol/L (ref 135–145)

## 2021-11-24 LAB — CULTURE, RESPIRATORY W GRAM STAIN: Gram Stain: NONE SEEN

## 2021-11-24 LAB — CBC
HCT: 29.8 % — ABNORMAL LOW (ref 39.0–52.0)
Hemoglobin: 9 g/dL — ABNORMAL LOW (ref 13.0–17.0)
MCH: 24.8 pg — ABNORMAL LOW (ref 26.0–34.0)
MCHC: 30.2 g/dL (ref 30.0–36.0)
MCV: 82.1 fL (ref 80.0–100.0)
Platelets: 273 10*3/uL (ref 150–400)
RBC: 3.63 MIL/uL — ABNORMAL LOW (ref 4.22–5.81)
RDW: 16.7 % — ABNORMAL HIGH (ref 11.5–15.5)
WBC: 12.5 10*3/uL — ABNORMAL HIGH (ref 4.0–10.5)
nRBC: 0 % (ref 0.0–0.2)

## 2021-11-24 LAB — VALPROIC ACID LEVEL: Valproic Acid Lvl: 26 ug/mL — ABNORMAL LOW (ref 50.0–100.0)

## 2021-11-24 LAB — PHOSPHORUS: Phosphorus: 3.6 mg/dL (ref 2.5–4.6)

## 2021-11-24 LAB — AMMONIA: Ammonia: 15 umol/L (ref 9–35)

## 2021-11-24 IMAGING — DX DG CHEST 1V PORT
1 series · 2 of 2 positions shown · non-contrast
Comparison: AP chest [DATE]

CLINICAL DATA: Fall with hematoma. Had craniotomy hematoma
evacuation surgery 6 days ago.

EXAM:
PORTABLE CHEST 1 VIEW

[Series 1: chest ap · 0.14mm/px · 2 of 2 slices shown]
[im 1/2]
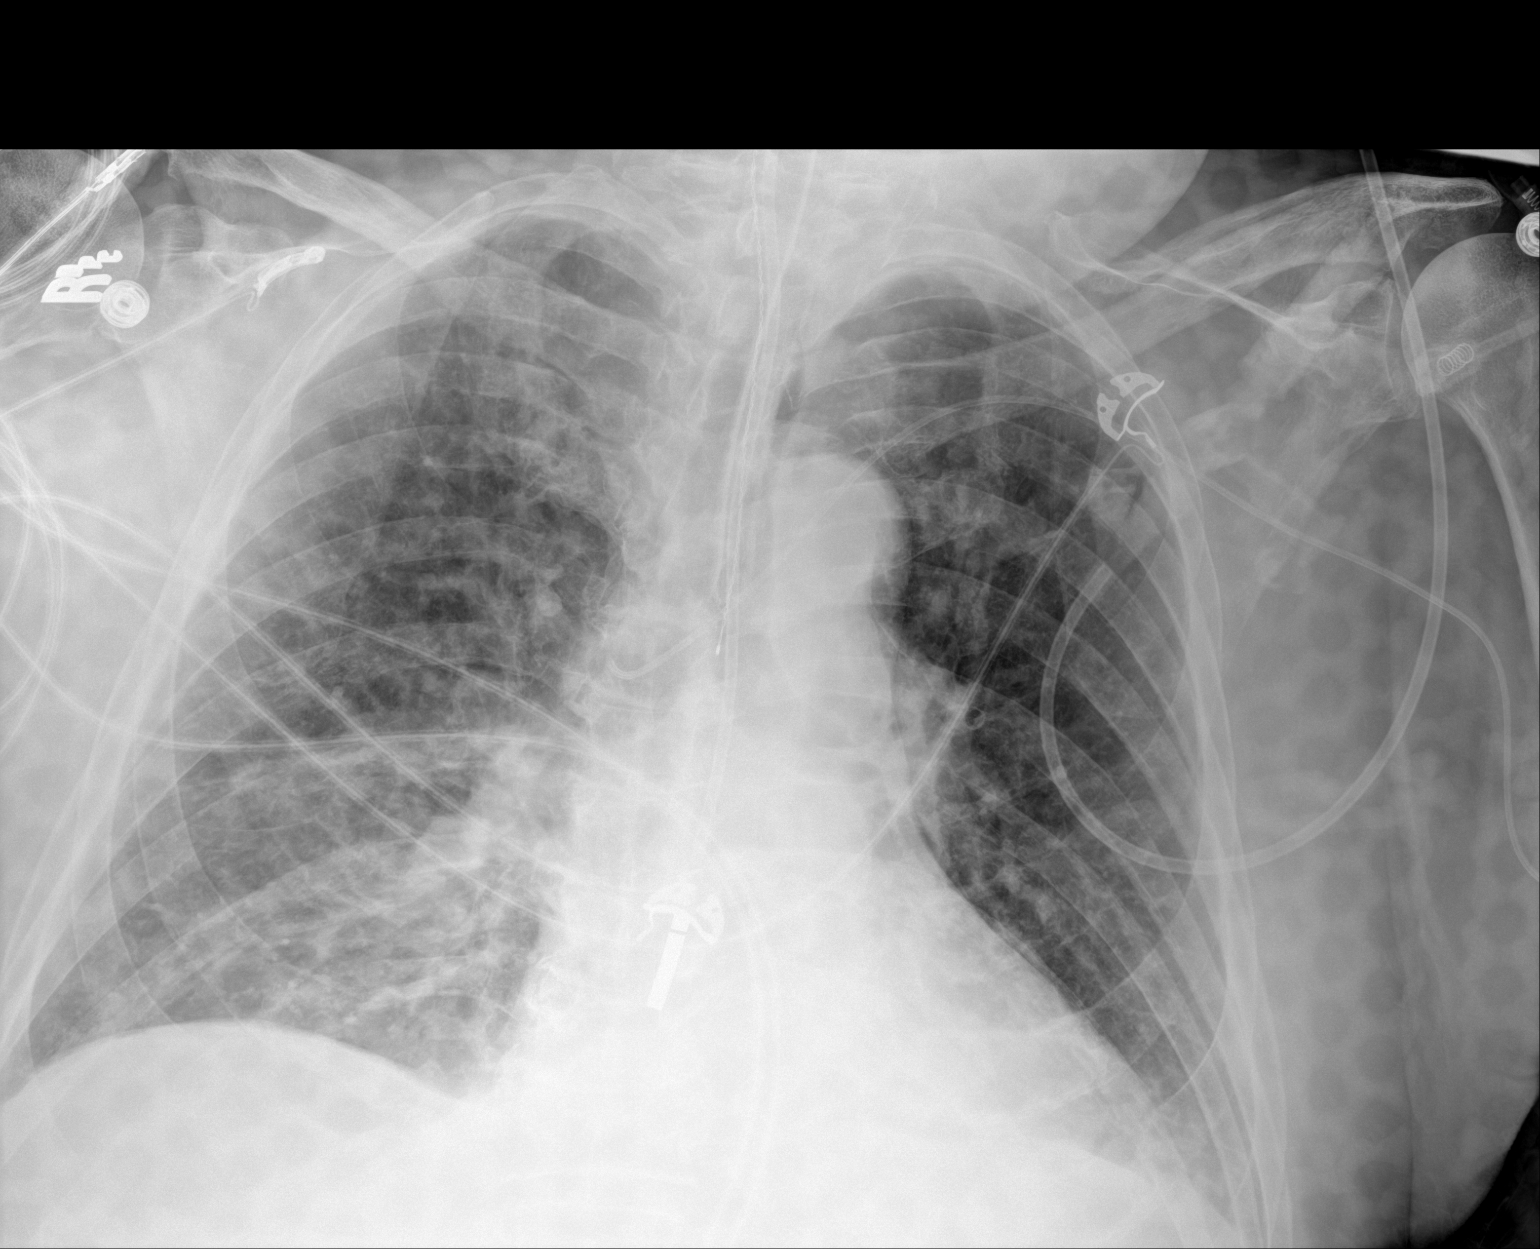
[im 2/2]
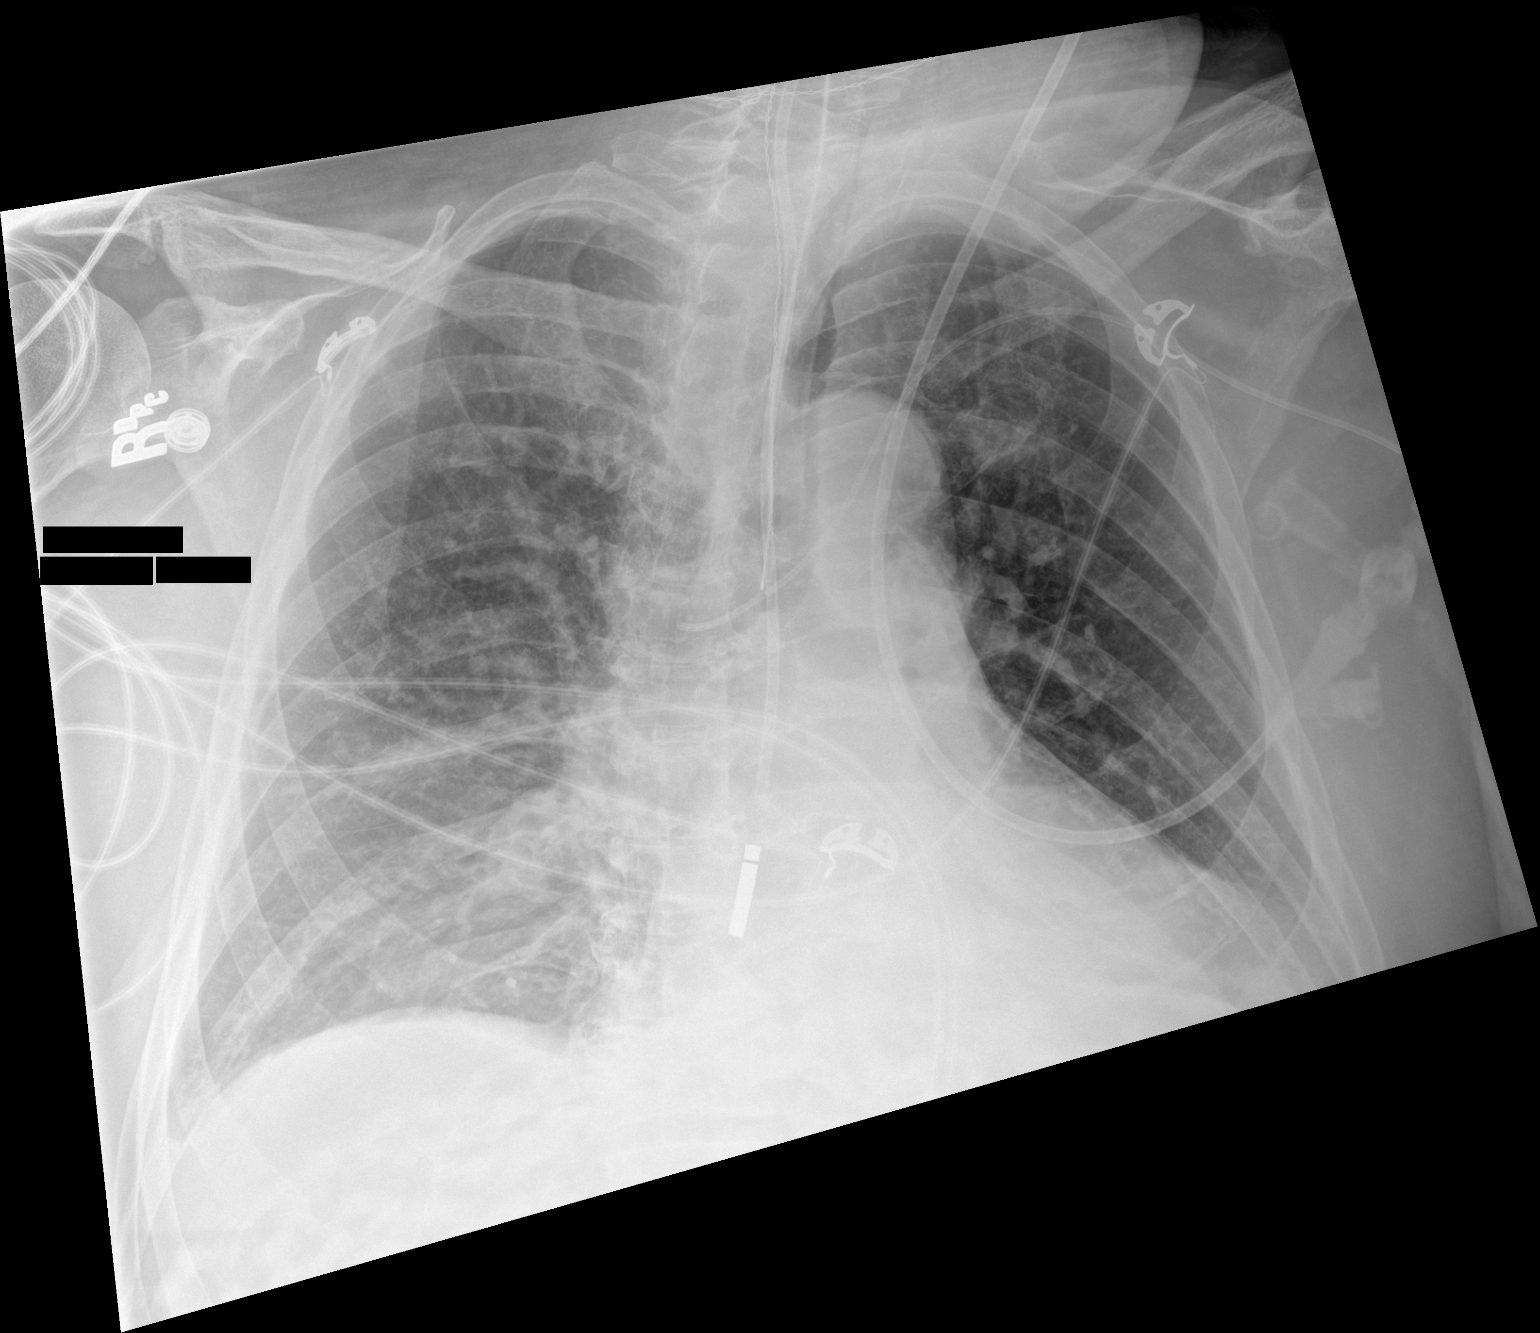

[2 of 2 positions shown; findings below may reference images not displayed]

FINDINGS: Endotracheal tube tip terminates approximately 2.5 cm above the
carina. Enteric tube is overlies the esophagus, with the weighted
tip overlying the distal aspect of the esophagus appearing pulled
back and proximal to the gastroesophageal junction compared to
prior.

Left upper extremity PICC tip is new from prior with the tip
overlying the junction of the brachiocephalic vein and superior vena
cava, minimally extending approximately 6 mm superiorly at the level
of the superior vena cava.

Cardiac silhouette and mediastinal contours are within normal limits
with mild calcification within aortic arch. Mild-to-moderate
bilateral interstitial thickening, greatest within the lung bases,
unchanged. Likely bibasilar atelectasis similar to prior.

No definite pleural effusion. No pneumothorax. No acute skeletal
abnormality.
IMPRESSION: 1. New left upper extremity PICC tip overlies the junction of the
brachiocephalic vein and superior vena cava, minimally curved
superiorly at the level of the superior vena cava.
2. Endotracheal tube terminates approximately 2.5 cm above the
carina.
3. Bibasilar atelectasis is similar to prior.

## 2021-11-24 MED ORDER — FUROSEMIDE 10 MG/ML IJ SOLN
40.0000 mg | Freq: Once | INTRAMUSCULAR | Status: AC
  Administered 2021-11-24: 40 mg via INTRAVENOUS
  Filled 2021-11-24: qty 4

## 2021-11-24 MED ORDER — INSULIN REGULAR(HUMAN) IN NACL 100-0.9 UT/100ML-% IV SOLN
INTRAVENOUS | Status: DC
  Filled 2021-11-24: qty 100

## 2021-11-24 MED ORDER — INSULIN ASPART 100 UNIT/ML IJ SOLN
11.0000 [IU] | INTRAMUSCULAR | Status: DC
  Administered 2021-11-24 – 2021-11-27 (×14): 11 [IU] via SUBCUTANEOUS
  Filled 2021-11-24 (×14): qty 1

## 2021-11-24 MED ORDER — INSULIN REGULAR(HUMAN) IN NACL 100-0.9 UT/100ML-% IV SOLN
INTRAVENOUS | Status: AC
  Administered 2021-11-24: 13 [IU]/h via INTRAVENOUS
  Filled 2021-11-24: qty 100

## 2021-11-24 MED ORDER — SODIUM CHLORIDE 0.9 % IV SOLN
2.0000 g | INTRAVENOUS | Status: DC
  Administered 2021-11-24 – 2021-11-27 (×4): 2 g via INTRAVENOUS
  Filled 2021-11-24: qty 20
  Filled 2021-11-24: qty 2
  Filled 2021-11-24: qty 20
  Filled 2021-11-24 (×2): qty 2

## 2021-11-24 MED ORDER — INSULIN ASPART 100 UNIT/ML IJ SOLN
2.0000 [IU] | INTRAMUSCULAR | Status: DC
  Administered 2021-11-25 – 2021-11-26 (×8): 4 [IU] via SUBCUTANEOUS
  Administered 2021-11-26: 6 [IU] via SUBCUTANEOUS
  Administered 2021-11-26 – 2021-11-27 (×2): 4 [IU] via SUBCUTANEOUS
  Filled 2021-11-24 (×11): qty 1

## 2021-11-24 MED ORDER — VALPROATE SODIUM 100 MG/ML IV SOLN
15.0000 mg/kg/d | Freq: Four times a day (QID) | INTRAVENOUS | Status: AC
  Administered 2021-11-24 – 2021-11-26 (×9): 274 mg via INTRAVENOUS
  Filled 2021-11-24 (×10): qty 2.74

## 2021-11-24 MED ORDER — INSULIN DETEMIR 100 UNIT/ML ~~LOC~~ SOLN
34.0000 [IU] | Freq: Two times a day (BID) | SUBCUTANEOUS | Status: DC
  Administered 2021-11-24 – 2021-11-26 (×5): 34 [IU] via SUBCUTANEOUS
  Filled 2021-11-24 (×6): qty 0.34

## 2021-11-24 MED ORDER — PROSOURCE TF PO LIQD
45.0000 mL | Freq: Every day | ORAL | Status: DC
  Administered 2021-11-24 – 2021-11-25 (×4): 45 mL
  Filled 2021-11-24: qty 45

## 2021-11-24 MED ORDER — DEXTROSE 50 % IV SOLN: 0.0000 mL | INTRAVENOUS | Status: DC | PRN

## 2021-11-24 MED ORDER — DEXTROSE 10 % IV SOLN: INTRAVENOUS | Status: DC | PRN

## 2021-11-24 MED ORDER — VITAL 1.5 CAL PO LIQD
1000.0000 mL | ORAL | Status: DC
  Administered 2021-11-24 (×2): 1000 mL

## 2021-11-24 NOTE — Progress Notes (Addendum)
PHARMACY CONSULT NOTE  ? ?Pharmacy Consult for Electrolyte Monitoring and Replacement  ? ?Recent Labs: ?Potassium (mmol/L)  ?Date Value  ?11/24/2021 3.0 (L)  ? ?Magnesium (mg/dL)  ?Date Value  ?11/24/2021 2.5 (H)  ? ?Calcium (mg/dL)  ?Date Value  ?11/24/2021 8.3 (L)  ? ?Albumin (g/dL)  ?Date Value  ?11/24/2021 1.9 (L)  ? ?Phosphorus (mg/dL)  ?Date Value  ?11/24/2021 3.6  ? ?Sodium (mmol/L)  ?Date Value  ?11/24/2021 144  ? ?Assessment: ?81 year old male with PMH of HTN, T2DM, ETOH use, HLD admitted with subarachnoid hemorrhage. S/p craniotomy. ? ?Nutrition: Vital HP at 65>55 mL/hr (1.3L) + free water flushes 30>50 mL q4h (369m) ? ?Goal of Therapy:  ?Electrolytes within normal limits ? ?Plan:  ?Scr stable at baseline; UOP from 1.6>0.753mk/h ?K 3.6>3.0; Will replete with KCL 4088mPO x1. ?Follow-up electrolytes with AM labs tomorrow ? ?BraLorna DibbleharmD, BCCP ?Clinical Pharmacist ?11/24/2021 8:02 AM ? ?

## 2021-11-24 NOTE — Progress Notes (Signed)
? ?NAME:  Michael Cohen, MRN:  [id number], DOB:  21-Jul-1940, LOS: 7 ?ADMISSION DATE:  12/04/2021, CONSULTATION DATE:  12/04/2021 ?REFERRING MD:  Dr. Sidney Ace, CHIEF COMPLAINT: Fall   ? ?History of Present Illness:  ?81 year old male presenting to [facility] ED from [facility] on 11/24/2021 via EMS.  Per the patient's wife, he had gone out to walk the dog that evening and had fallen in the entryway hitting his head on the corner of the baseboard.  She reported he was altered post fall with complaints of a headache. Patient is not on systemic blood thinners but does take a baby aspirin every other day for heart health.  Wife confirms that the patient drinks 2 scotches daily and sometimes wine with dinner, unclear the exact amount of scotch.  She denies any other recent complaints.  No reports of witnessed seizure-like activity and the patient did confirm that he had drinking alcohol that evening. ?ED course: ?Head CT showed large subarachnoid hemorrhage.  Follow-up CTA revealed no aneurysm.  EDP spoke with Dr. Cari Caraway with neurosurgery who initially recommended admitting to hospitalist service with repeat CT in 6 hours.  TRH admitted patient to stepdown unit.  However, patient still boarding in ED when he became agitated attempting to get out of bed and then vomited.  At this point EDP described patient as having gurgling respirations with SPO2 83% on room air.  Patient was emergently intubated requiring mechanical ventilatory support.  Stat repeat head CT revealed new subdural bleed with 7 mm midline shift.  Dr. Cari Caraway from neurosurgery made aware of change, discussed options bedside with wife and the decision was made to take the patient urgently to the OR and transferred directly to ICU. ?medications given: Keppra 1000 mg, mannitol 50 g, IV contrast, fentanyl, 1 L NS, Tdap, banana bag, etomidate, succinylcholine, propofol drip started ?Initial Vitals: 97.3 F, 17, 62, 117/67 and SPO2 95% on room air ?Significant labs:  (Labs/ Imaging personally reviewed) ?I, Domingo Pulse Rust[place], AGACNP-BC, personally viewed and interpreted this ECG. ?EKG Interpretation: Date: 12/11/2021, EKG Time: 23: 55, Rate: 63, Rhythm: First-degree heart block, QRS Axis: Normal,  ?Intervals: First-degree heart block, borderline prolonged QTc, ST/T Wave abnormalities: None, Narrative Interpretation: NSR with first-degree heart block ?Chemistry: Na+:139, K+: 3.3, BUN/Cr.: 8/0.67, Serum CO2/ AG: 24/13 ?Hematology: WBC: 8.2, Hgb: 12.5,  ?Troponin: 6 >5, COVID-19 & Influenza A/B: pending ?ABG: pending ?CXR 12/02/2021: Low lung volumes with probable areas of passive subsegmental atelectasis, as above ?CT head Wo contrast 11/23/2021: Diffuse subarachnoid hemorrhage as described above concentrated predominately in the left sylvian fissure. 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 CTA of the head is ?recommended for further evaluation. Chronic atrophic and ischemic changes are noted ?CT cervical spine 12/07/2021: Degenerative change without acute abnormality ?CT angio head 11/30/2021: No aneurysm, occlusion or high-grade stenosis of the intracranial arteries ?CT head wo contrast 11/23/2021: New subdural hematoma along the majority of the left cerebral convexity measuring up to 1 cm in thickness and primarily responsible for new 7 mm midline shift. Some thickening of subarachnoid hemorrhage although stable pattern. Trace subdural hematoma along the anterior right frontal convexity. Early visualization of cerebral contusion in the bilateral inferior frontal and left temporal lobes. ? ?PCCM consulted due to deterioration requiring emergent intubation and mechanical ventilatory support. ? 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 ? ?Micro data:  ?5/4: SARS-CoV-2 and influenza PCR>> negative ?5/4: MRSA PCR>> negative ?5/7: Tracheal  aspirate>>CITROBACTER KOSERI (pansensitive) ?5/8:  Tracheal aspirate>>CITROBACTER KOSERI  ?5/9: MRSA PCR>> negative ? ?Antimicrobials:  ?Unasyn 5/4>> 5/5 ?Azithromycin 5/9 x 1 dose ?Vancomycin 5/9 >> ?Zosyn 5/9>> ? ?Significant Hospital Events: ?Including procedures, antibiotic start and stop dates in addition to other pertinent events   ?5/4: Admit to ICU with worsening SAH after emergent intubation on mechanical ventilatory support with plans to go urgently to OR for neurosurgery ?5/5: s/p craniotomy for TRAUMATIC SAH/subdural hematoma, remains on vent ?5/5: remains on  vent, family updated ?5/6: JP drain removed per Neurosurgery  ?5/6: MRI Brain revealed no ischemic infarction identified. Comparing the MRI to  ?     the most recent CT of earlier today, no change is appreciated. Sequela of   ?     hemorrhagic contusion of both frontal lobes and in the anterior left temporal  ?     lobe. Widespread subarachnoid blood and dependent intraventricular blood with  ?     stable ventricular size. Stable subdural blood along the medial aspect of the  ?     middle cranial fossa on the left, along the falx posteriorly and along the  ?     tentorium. ?5/6: CT Head revealed multifocal intracranial hemorrhage following trauma and  ?      surgical evacuation of left side subdural has not significantly changed since    ?      11/27/2021. Conspicuous cerebral edema throughout the left temporal lobe and ?       in both inferior frontal gyri is favored to be posttraumatic, and is at least in part  ?       due to hemorrhagic cerebral contusions. Mild rightward midline shift of 4-5 mm  ?       has slightly increased, but there is no other progressive intracranial mass effect.  ?       A moderate volume of intraventricular hemorrhage has increased, but ventricle  ?       size and configuration remains stable. ?5/8: Overnight propofol gtt started due to vent asynchrony overnight.  Pt unable to follow commands.   ?5/8: EEG suggestive of cortical dysfunction in left frontotemporal region  likely secondary to underlying structural abnormality as well as moderate to severe diffuse encephalopathy, nonspecific to etiology.  No seizures or definite epileptiform discharges were seen throughout the recording. ?5/9: Propofol gtt stopped for WUA neurological exam remains unchanged unable to follow commands.  Pt with increased work of breathing when sedation stopped.  Will diurese with 40 mg iv lasix x1 dose currently 2L positive  ?5/10: Patient with recurrent right lip/mouth twitching concerning for seizures.  EEG pending.  MRI with enlarging ventricles but otherwise no major changes from 5/6 MRI. Neurosurgery does not recommend intervening on enlarged ventricles at this time.  Consult neurology.  PICC placed due to lack of IV access ?5/11: EEG without seizure activity.  Leukocytosis slowly improving, fever resolved.  Diurese with Lasix x1 dose ? ?Interim History / Subjective:  ?-No acute events noted , PICC line placed yesterday evening due to lack of IV access ?-Afebrile, hemodynamically stable, no vasopressors ?-Currently sedated on propofol and Versed, able to elicit withdrawal to pain to bilateral lower extremities and left upper extremity ~right lip twitching absent today ?-Will plan for wake-up assessment today ?-MRI yesterday with enlarging ventricles but otherwise no major changes from 5/6 MRI. Neurosurgery does not recommend intervening on enlarged ventricles at this time ?-EEG yesterday with diffuse encephalopathic (nonspecific etiology), no seizure or epileptiform  activity ?-Leukocytosis slowly improving to 12.5 K (14.3 yesterday) ?-Tracheal aspirate with Citrobacter koseri  ?-Urine output 1.5 L yesterday's (net +3.3 L since admit), creatinine remained stable at 0.54 ?-Patient with course rhonchi upon auscultation, will give Lasix 40 mg x 1 dose today ? ?Objective   ?Blood pressure 136/70, pulse 80, temperature 97.9 ?F (36.6 ?C), temperature source Esophageal, resp. rate (!) 29, height '5\' 10"'$   (1.778 m), weight 94.6 kg, SpO2 100 %. ?   ?Vent Mode: PRVC ?FiO2 (%):  [35 %] 35 % ?Set Rate:  [12 bmp] 12 bmp ?Vt Set:  [500 mL] 500 mL ?PEEP:  [5 cmH20] 5 cmH20  ? ?Intake/Output Summary (Last 24 hours) at 11/25/18

## 2021-11-24 NOTE — Progress Notes (Signed)
Nutrition Follow Up Note  ? ?DOCUMENTATION CODES:  ? ?Not applicable ? ?INTERVENTION:  ? ?Decrease Vital 1.5 to 70m/hr + ProSource TF 471m5 times daily via tube ? ?Propofol: 18.1 ml/hr- provides 477kcal/day  ? ?Free water flushes 5067m4 hours to maintain tube patency  ? ?Regimen provides 2477kcal/day, 136g/day protein and 1216m57my of free water.  ? ?NUTRITION DIAGNOSIS:  ? ?Inadequate oral intake related to inability to eat (pt sedated and ventilated) as evidenced by NPO status. ? ?GOAL:  ? ?Provide needs based on ASPEN/SCCM guidelines ?-met  ? ?MONITOR:  ? ?Vent status, Labs, Weight trends, TF tolerance, Skin, I & O's ? ?ASSESSMENT:  ? ?[age] male with h/o etoh abuse, GERD, HTN and DM who is admitted with SDH after fall now s/p left frontotemporoparietal craniotomy for evacuation of hematoma 5/4. ? ?Pt s/p NGT placement 5/5  ? ?Pt remains sedated and ventilated. NGT in place; pt tolerating tube feeds well at goal rate. Pt requiring insulin drip. Electrolytes being managed by pharmacy. Per chart, pt is up ~18lbs since admission. Pt +3.3L on his I & O's. Pt is receiving lasix. Plan is for possible SBTs today. Family does not want trach/PEG.  ? ?Medications reviewed and include: D3, colace, folic acid, heparin, insulin, MVI, omega 3, protonix, miralax, thiamine, ceftriaxone, propofol  ? ?Labs reviewed: Na 144 wnl, K 3.0(L), BUN 25(H), creat 0.54(L), P 3.6 wnl, Mg 2.5(H) ?Wbc- 12.5(H), Hgb 9.0(L), Hct 29.8(L) ?Cbgs- 157, 179, 168, 172, 206 x 24 hrs ? ?Patient is currently intubated on ventilator support ?MV: 11.7 L/min ?Temp (24hrs), Avg:97.6 ?F (36.4 ?C), Min:95.5 ?F (35.3 ?C), Max:100 ?F (37.8 ?C) ? ?Propofol: 18.1 ml/hr- provides 477kcal/day  ? ?MAP- >65mm80m? ?UOP- 1560ml 40miet Order:   ? ?Diet Order   ? ?       ?  Diet NPO time specified  Diet effective now       ?  ? ?  ?  ? ?  ? ?EDUCATION NEEDS:  ? ?No education needs have been identified at this time ? ?Skin:  Skin Assessment: Reviewed RN Assessment  (incision head) ? ?Last BM:  5/10- type 7 ? ?Height:  ? ?Ht Readings from Last 1 Encounters:  ?12/07/2021 5' 10"  (1.778 m)  ? ? ?Weight:  ? ?Wt Readings from Last 1 Encounters:  ?11/24/21 94.6 kg  ? ? ?Ideal Body Weight:  75.45 kg ? ?BMI:  Body mass index is 29.92 kg/m?. ? ?Estimated Nutritional Needs:  ? ?Kcal:  2100-2400kcal/day ? ?Protein:  120-135g/day ? ?Fluid:  1.9-2.2L/day ? ?Lanie Schelling Koleen DistanceD, LDN ?Please refer to [facility]ION for RD and/or RD on-call/weekend/after hours pager ? ?

## 2021-11-24 NOTE — Progress Notes (Addendum)
Neurology Progress Note ? ?Patient ID: Michael Cohen is a 81 y.o. with PMHx of  has a past medical history of Diabetes mellitus without complication ([place]), Hypercholesteremia, and Hypertension. And daily drinking, admitted with traumatic SAH, SDH, IPH, IVH, s/p hematoma evacuation 5/4 now with developing hydrocephalus, neurology consulted for focal seizure activity on 5/10 (right facial twitching with left gaze started 5/9 when weaning propofol)  ? ?Interval events/ Subjective: ?- Remains minimally interactive ?- Per nurse at bedside, no twitching is signed out to her overnight ? ?Exam: ?Vitals:  ? 11/24/21 0500 11/24/21 0600  ?BP: 132/63 136/70  ?Pulse: 82 80  ?Resp: (!) 30 (!) 29  ?Temp: (!) 96.8 ?F (36 ?C) 97.9 ?F (36.6 ?C)  ?SpO2: 100% 100%  ? ?Gen: In bed, comfortable  ?Resp: non-labored breathing, no grossly audible wheezing, comfortable on the vent ?Cardiac: Perfusing extremities well, Normal sinus rhythm today  ?Abd: soft, nt ? ?Neuro:  ?Mental Status: ?Does not open eyes spontaneously, to voice or noxious stimulation ?Does not follow any commands ?Cranial Nerves: ?II: No blink to threat. Pupils are equal, round, and reactive to light.  ?III,IV, VI/VIII: dysconjugate gaze ?V/VII: Eyelash brush response absent on the right, present on the left tos aline ?VIII: No response to voice ?X/XI: Intact cough, no gag for me ?XII: Unable to assess tongue protrusion secondary to patient's mental status  ?Motor/Sensory: In the upper extremities, he moves more briskly on the left than the right side. Moves uppers more briskly than lowers. Question triple flexion vs. Withdrawal RLE otherwise withdraws in other 3 extremities.  ?Cerebellar: ?Unable to assess secondary to patient's mental status  ?  ?Pertinent Data: ?Ammonia 15 ?LFTs rising, AST 60, ALT 92 ?BUN 25, Cr 0.54 ?Hypokalemic to 3.0  ?Leukocytosis improving to 12.5 ?Hemogloblin stable (range 9 - 12 recently)  ?VPA level 26 (post 2000 mg load, likely not yet  steady state)  ? ?EEG personally reviewed, agree with Dr.  Hortense Ramal read, slow with left temporal breach artifact and no ongoing seizure activity ? ?Impression: Focal motor status with her right face twitching and left gaze deviation, now resolved on 4 antiseizure agents.  We will attempt to wean Versed first, followed by weaning propofol ? ?Recommendations: ?# Focal seizures with right facial twitching ?- Keppra continue 1500 mg BID, will adjust as needed for renal function:  ?Estimated Creatinine Clearance: 86.4 mL/min (A) (by C-G formula based on SCr of 0.54 mg/dL (L)).  ? CrCl 80 to 130 mL/minute/1.73 m2: 500 mg to 1.5 g every 12 hours. ? CrCl 50 to <80 mL/minute/1.73 m2: 500 mg to 1 g every 12 hours. ? CrCl 30 to <50 mL/minute/1.73 m2: 250 to 750 mg every 12 hours. ? CrCl 15 to <30 mL/minute/1.73 m2: 250 to 500 mg every 12 hours. ? CrCl <15 mL/minute/1.73 m2: 250 to 500 mg every 24 hours (expert opinion). ?-Continue propofol at this time ?-Wean versed  ?-Continue Depakote, trend LFTs and ammonia, increasing dose to 15 mg/kg/day given low post-load level,  ?-recheck level tomorrow at 2100 prior to 2200 dose ? ?# B12 deficiency ? - 1000 mcg IM daily x 7 days then 1000 mcg PO daily ? ?#Atrial fibrillation on monitor ?-Anticoagulation contraindicated in the setting of his acute subdural hemorrhage ?-reverted to normal sinus after amio bolus 5/10 ?-If review of tele confirms atrial fibrillation, long-term would recommend anticoagulation for secondary stroke prevention when safe to do so from a neurosurgical perspective ? ?Appreciate neurosurgery and ICU co-management of other medical issues ? ?  Lesleigh Noe MD-PhD ?[facility] ?[phone]  ? ?45 min of critical care today ?

## 2021-11-24 NOTE — Progress Notes (Deleted)
Pt was suctioned for a small amount of secretions. Per Dr's order, she was extubated without incident. She is voicing and is on RA. ?

## 2021-11-24 NOTE — Progress Notes (Signed)
? ? Attending Progress Note ? ?History: Michael Cohen is a 81 y.o presenting after a fall presenting to the ER with confusion and headache. Was found to have [place] with left sided SDH. S/p left craniotomy for evacuation on 12/09/2021.  ? ?POD7: Pt remains on sedation. EEG yesterday was negative for subclinical status. ? ?POD6: pt less responsive this morning. Right facial twitching present this am. ? ?POD5: Continues to require intermittent sedation overnight. No significant changes.  ? ?POD4:requiring increased sedation overnight due to vent asynchrony.   ? ?POD3: Remains intubated.  Is more interactive today. ? ?POD2: Pt remains intubated.  He moves BUE and BLE more briskly on the L.  He is currently heavily sedated. ? ?POD1: Pt remains in the ICU on sedation, pressures, and intubated. Neurologically stable. Incision was bloody drainage yesterday evening. Was reinforced with additional staples without any continued issue.  ? ?Physical Exam: ?Vitals:  ? 11/24/21 0500 11/24/21 0600  ?BP: 132/63 136/70  ?Pulse: 82 80  ?Resp: (!) 30 (!) 29  ?Temp: (!) 96.8 ?F (36 ?C) 97.9 ?F (36.6 ?C)  ?SpO2: 100% 100%  ? ?Remains intubated and sedated  ?PERRL. Facial grimace symmetric.  ?Withdrawals x 4 ?Incision c/d/I ? ?Data: ? ?Recent Labs  ?Lab 11/22/21 ?0606 11/23/21 ?7262 11/24/21 ?0355  ?NA 141 140 144  ?K 4.5 3.6 3.0*  ?CL 106 105 107  ?CO2 '25 27 28  '$ ?BUN 27* 27* 25*  ?CREATININE 0.62 0.64 0.54*  ?GLUCOSE 211* 165* 198*  ?CALCIUM 8.6* 8.4* 8.3*  ? ? ?Recent Labs  ?Lab 11/24/21 ?9741  ?AST 60*  ?ALT 92*  ?ALKPHOS 81  ? ?  ? Recent Labs  ?Lab 11/22/21 ?0606 11/23/21 ?6384 11/24/21 ?5364  ?WBC 13.0* 14.3* 12.5*  ?HGB 12.0* 11.3* 9.0*  ?HCT 39.1 36.4* 29.8*  ?PLT 163 295 273  ? ? ?No results for input(s): APTT, INR in the last 168 hours. ?  ?   ? ? ?Other tests/results:  ? ?MRI brain 11/23/21 ?IMPRESSION: ?1. No definite empyema or other evidence of intracranial infection. ?2. Interval enlargement of the lateral and third  ventricles, ?concerning for hydrocephalus. ?3. Otherwise stable compared to the 11/19/2021 MRI, with ?redemonstrated hemorrhagic contusions in the bilateral frontal lobes ?and anterior left temporal lobe, subdural, and subarachnoid ?hemorrhage, which are now more apparent given evolving blood ?products. ?  ?  ?Electronically Signed ?  By: Merilyn Baba M.D. ?  On: 11/23/2021 15:25 ?  ? ?Head CT 11/18/2021 (post-op) ?IMPRESSION: ?1. Status post left convexity subdural hematoma evacuation with ?resolution of midline shift. ?2. Residual mixed subdural and subarachnoid blood surrounding the ?cerebellum and over the left convexity, but the dominant component ?of the subdural hematoma has markedly decreased. ?  ?  ?Electronically Signed ?  By: Ulyses Jarred M.D. ?  On: 12/02/2021 19:03 ? ?MRI Brain 11/19/21 ?IMPRESSION: ?No ischemic infarction identified. Comparing the MRI to the most ?recent CT of earlier today, no change is appreciated. Sequela of ?hemorrhagic contusion of both frontal lobes and in the anterior left ?temporal lobe. Widespread subarachnoid blood and dependent ?intraventricular blood with stable ventricular size. Stable subdural ?blood along the medial aspect of the middle cranial fossa on the ?left, along the falx posteriorly and along the tentorium. ?  ?  ?Electronically Signed ?  By: Nelson Chimes M.D. ?  On: 11/19/2021 16:46 ? ?Assessment/Plan: ? ?Michael Cohen is a 81 y.o presenting after a fall resulting in tSAH and SDH s/p craniotomy for SDH evacuation  on 12/07/2021. ? ?- EEG negative for underlying seizure activity.  ?- Would continue to take sedation breaks and keep sedation and light as possible.  ?- continue Keppra '500mg'$  BID x 7 days post-op ?- SBP<160 ?- OK for SQH for Dvt PPX ? ?Cooper Render PA-C ?Department of Neurosurgery ? ?  ?

## 2021-11-24 NOTE — Progress Notes (Signed)
? ?                                                                                                                                                     ?                                                   ?Daily Progress Note  ? ?Patient Name: Michael Cohen       Date: 11/24/2021 ?DOB: [birth year]  Age: 81 y.o. MRN#: [id number] ?Attending Physician: Freddi Starr, MD ?Primary Care Physician: Kirk Ruths, MD ?Admit Date: 12/07/2021 ? ?Reason for Consultation/Follow-up: Establishing goals of care ? ?Subjective: ?Notes and labs reviewed in detail. Per notes wife is leaning towards one way extubation and away from tracheostomy and PEG placement.  Patient is resting in bed on ventilator. Wife is at bedside with another individual. Wife quickly introduces her as a family friend and stops talking. Discussed following up at another time.  ? ?Length of Stay: 7 ? ?Current Medications: ?Scheduled Meds:  ?  stroke: early stages of recovery book   Does not apply Once  ? atorvastatin  40 mg Per Tube Daily  ? chlorhexidine gluconate (MEDLINE KIT)  15 mL Mouth Rinse BID  ? Chlorhexidine Gluconate Cloth  6 each Topical Daily  ? cholecalciferol  2,000 Units Per Tube Daily  ? cyanocobalamin  1,000 mcg Intramuscular Daily  ? Followed by  ? [START ON 11/30/2021] vitamin B-12  1,000 mcg Per Tube Daily  ? docusate  100 mg Per Tube BID  ? feeding supplement (PROSource TF)  45 mL Per Tube 5 X Daily  ? folic acid  1 mg Per Tube Daily  ? free water  50 mL Per Tube Q4H  ? heparin injection (subcutaneous)  5,000 Units Subcutaneous Q8H  ? ipratropium-albuterol  3 mL Nebulization Q6H  ? loratadine  10 mg Per Tube Daily  ? mouth rinse  15 mL Mouth Rinse 10 times per day  ? midazolam  2 mg Intramuscular Once  ? multivitamin with minerals  1 tablet Per Tube Daily  ? omega-3 acid ethyl esters  1 g Per Tube Daily  ? pantoprazole sodium  40 mg Per Tube Daily  ? polyethylene glycol  17 g Per Tube Daily  ? sodium chloride flush  10-40 mL  Intracatheter Q12H  ? thiamine injection  100 mg Intravenous Daily  ? ? ?Continuous Infusions: ? cefTRIAXone (ROCEPHIN)  IV    ? dexmedetomidine (PRECEDEX) IV infusion Stopped (11/23/21 6553)  ? feeding supplement (VITAL 1.5 CAL) 1,000 mL (11/24/21 1140)  ? insulin 6.5 Units/hr (11/24/21 1122)  ? levETIRAcetam Stopped (11/24/21 7482)  ? midazolam  Stopped (11/24/21 1019)  ? phenylephrine (NEO-SYNEPHRINE) Adult infusion    ? propofol (DIPRIVAN) infusion Stopped (11/24/21 1102)  ? valproate sodium    ? ? ?PRN Meds: ?acetaminophen **OR** acetaminophen (TYLENOL) oral liquid 160 mg/5 mL **OR** acetaminophen, dextrose, fentaNYL (SUBLIMAZE) injection, midazolam, ondansetron (ZOFRAN) IV, oxyCODONE-acetaminophen, propofol, sodium chloride flush, traZODone ? ?Physical Exam ?Constitutional:   ?   Comments: Eyes closed. On ventilator.   ?         ? ?Vital Signs: BP 129/69   Pulse 91   Temp 100.3 ?F (37.9 ?C) (Axillary)   Resp (!) 30   Ht 5' 10"  (1.778 m)   Wt 94.6 kg   SpO2 96%   BMI 29.92 kg/m?  ?SpO2: SpO2: 96 % ?O2 Device: O2 Device: Ventilator ?O2 Flow Rate:   ? ?Intake/output summary:  ?Intake/Output Summary (Last 24 hours) at 11/24/2021 1152 ?Last data filed at 11/24/2021 1122 ?Gross per 24 hour  ?Intake 3392.73 ml  ?Output 1685 ml  ?Net 1707.73 ml  ? ?LBM: Last BM Date : 11/23/21 ?Baseline Weight: Weight: 86.2 kg ?Most recent weight: Weight: 94.6 kg ? ? ? ?Patient Active Problem List  ? Diagnosis Date Noted  ? SAH (subarachnoid hemorrhage) ([place]) 12/07/2021  ? Fall at home, initial encounter 11/19/2021  ? Essential hypertension 11/19/2021  ? Type 2 diabetes mellitus without complications ([place]) 07/09/8249  ? GERD without esophagitis 12/02/2021  ? ? ?Palliative Care Assessment & Plan  ? ?Recommendations/Plan: ?PMT will continue to follow.  ? ?Code Status: ? ?  ?Code Status Orders  ?(From admission, onward)  ?  ? ? ?  ? ?  Start     Ordered  ? 11/19/21 1431  Do not attempt resuscitation (DNR)  Continuous       ?Question  Answer Comment  ?In the event of cardiac or respiratory ARREST Do not call a ?code blue?   ?In the event of cardiac or respiratory ARREST Do not perform Intubation, CPR, defibrillation or ACLS   ?In the event of cardiac or respiratory ARREST Use medication by any route, position, wound care, and other measures to relive pain and suffering. May use oxygen, suction and manual treatment of airway obstruction as needed for comfort.   ?  ? 11/19/21 1430  ? ?  ?  ? ?  ? ?Code Status History   ? ? Date Active Date Inactive Code Status Order ID Comments User Context  ? 11/26/2021 0222 11/19/2021 1430 Full Code [id number]  Christel Mormon, MD ED  ? ?  ? ? ?Thank you for allowing the Palliative Medicine Team to assist in the care of this patient. ? ? ?Asencion Gowda, NP ? ?Please contact Palliative Medicine Team phone at [phone] for questions and concerns.  ? ? ? ? ? ?

## 2021-11-25 ENCOUNTER — Other Ambulatory Visit: Payer: Self-pay

## 2021-11-25 ENCOUNTER — Encounter: Payer: Self-pay | Admitting: Neurosurgery

## 2021-11-25 DIAGNOSIS — I609 Nontraumatic subarachnoid hemorrhage, unspecified: Secondary | ICD-10-CM

## 2021-11-25 DIAGNOSIS — Z7189 Other specified counseling: Secondary | ICD-10-CM | POA: Diagnosis not present

## 2021-11-25 LAB — HEPATIC FUNCTION PANEL
ALT: 70 U/L — ABNORMAL HIGH (ref 0–44)
AST: 41 U/L (ref 15–41)
Albumin: 1.9 g/dL — ABNORMAL LOW (ref 3.5–5.0)
Alkaline Phosphatase: 75 U/L (ref 38–126)
Bilirubin, Direct: 0.2 mg/dL (ref 0.0–0.2)
Indirect Bilirubin: 0.6 mg/dL (ref 0.3–0.9)
Total Bilirubin: 0.8 mg/dL (ref 0.3–1.2)
Total Protein: 5.1 g/dL — ABNORMAL LOW (ref 6.5–8.1)

## 2021-11-25 LAB — BASIC METABOLIC PANEL
Anion gap: 6 (ref 5–15)
BUN: 19 mg/dL (ref 8–23)
CO2: 31 mmol/L (ref 22–32)
Calcium: 8.2 mg/dL — ABNORMAL LOW (ref 8.9–10.3)
Chloride: 103 mmol/L (ref 98–111)
Creatinine, Ser: 0.51 mg/dL — ABNORMAL LOW (ref 0.61–1.24)
GFR, Estimated: >60 mL/min (ref >60)
Glucose, Bld: 203 mg/dL — ABNORMAL HIGH (ref 70–99)
Potassium: 3.3 mmol/L — ABNORMAL LOW (ref 3.5–5.1)
Sodium: 140 mmol/L (ref 135–145)

## 2021-11-25 LAB — GLUCOSE, CAPILLARY
Glucose-Capillary: 171 mg/dL — ABNORMAL HIGH (ref 70–99)
Glucose-Capillary: 177 mg/dL — ABNORMAL HIGH (ref 70–99)
Glucose-Capillary: 178 mg/dL — ABNORMAL HIGH (ref 70–99)
Glucose-Capillary: 178 mg/dL — ABNORMAL HIGH (ref 70–99)
Glucose-Capillary: 188 mg/dL — ABNORMAL HIGH (ref 70–99)
Glucose-Capillary: 195 mg/dL — ABNORMAL HIGH (ref 70–99)

## 2021-11-25 LAB — VALPROIC ACID LEVEL: Valproic Acid Lvl: 27 ug/mL — ABNORMAL LOW (ref 50.0–100.0)

## 2021-11-25 LAB — CBC
HCT: 28.2 % — ABNORMAL LOW (ref 39.0–52.0)
Hemoglobin: 8.8 g/dL — ABNORMAL LOW (ref 13.0–17.0)
MCH: 25.5 pg — ABNORMAL LOW (ref 26.0–34.0)
MCHC: 31.2 g/dL (ref 30.0–36.0)
MCV: 81.7 fL (ref 80.0–100.0)
Platelets: 269 10*3/uL (ref 150–400)
RBC: 3.45 MIL/uL — ABNORMAL LOW (ref 4.22–5.81)
RDW: 17.1 % — ABNORMAL HIGH (ref 11.5–15.5)
WBC: 12.5 10*3/uL — ABNORMAL HIGH (ref 4.0–10.5)
nRBC: 0 % (ref 0.0–0.2)

## 2021-11-25 LAB — MAGNESIUM: Magnesium: 2.4 mg/dL (ref 1.7–2.4)

## 2021-11-25 LAB — AMMONIA: Ammonia: 34 umol/L (ref 9–35)

## 2021-11-25 LAB — TRIGLYCERIDES: Triglycerides: 822 mg/dL — ABNORMAL HIGH (ref <150)

## 2021-11-25 LAB — PHOSPHORUS: Phosphorus: 4 mg/dL (ref 2.5–4.6)

## 2021-11-25 MED ORDER — POTASSIUM CHLORIDE CRYS ER 20 MEQ PO TBCR: 40.0000 meq | EXTENDED_RELEASE_TABLET | ORAL | Status: DC

## 2021-11-25 MED ORDER — POLYETHYLENE GLYCOL 3350 17 G PO PACK: 17.0000 g | PACK | Freq: Every day | ORAL | Status: DC

## 2021-11-25 MED ORDER — MIDAZOLAM HCL 2 MG/2ML IJ SOLN
1.0000 mg | INTRAMUSCULAR | Status: DC | PRN
  Administered 2021-11-25 – 2021-11-26 (×2): 1 mg via INTRAVENOUS
  Filled 2021-11-25: qty 2

## 2021-11-25 MED ORDER — ARTIFICIAL TEARS OPHTHALMIC OINT
TOPICAL_OINTMENT | OPHTHALMIC | Status: DC | PRN
  Filled 2021-11-25: qty 3.5

## 2021-11-25 MED ORDER — VITAL 1.5 CAL PO LIQD
1000.0000 mL | ORAL | Status: DC
Start: 2021-11-25 — End: 2021-11-28
  Administered 2021-11-25 – 2021-11-28 (×4): 1000 mL

## 2021-11-25 MED ORDER — PROSOURCE TF PO LIQD
45.0000 mL | Freq: Two times a day (BID) | ORAL | Status: DC
  Administered 2021-11-25 – 2021-11-28 (×6): 45 mL
  Filled 2021-11-25: qty 45

## 2021-11-25 MED ORDER — MIDAZOLAM HCL 2 MG/2ML IJ SOLN
1.0000 mg | INTRAMUSCULAR | Status: DC | PRN
  Administered 2021-11-25: 1 mg via INTRAVENOUS
  Filled 2021-11-25 (×2): qty 2

## 2021-11-25 MED ORDER — THIAMINE HCL 100 MG PO TABS
100.0000 mg | ORAL_TABLET | Freq: Every day | ORAL | Status: DC
  Administered 2021-11-25 – 2021-11-28 (×4): 100 mg
  Filled 2021-11-25 (×4): qty 1

## 2021-11-25 MED ORDER — OXYCODONE HCL 5 MG PO TABS
10.0000 mg | ORAL_TABLET | Freq: Four times a day (QID) | ORAL | Status: DC
  Administered 2021-11-25 – 2021-11-28 (×10): 10 mg
  Filled 2021-11-25 (×11): qty 2

## 2021-11-25 MED ORDER — FUROSEMIDE 10 MG/ML IJ SOLN
40.0000 mg | Freq: Once | INTRAMUSCULAR | Status: AC
  Administered 2021-11-25: 40 mg via INTRAVENOUS
  Filled 2021-11-25: qty 4

## 2021-11-25 MED ORDER — DOCUSATE SODIUM 50 MG/5ML PO LIQD: 100.0000 mg | Freq: Two times a day (BID) | ORAL | Status: DC

## 2021-11-25 MED ORDER — OXYCODONE HCL 5 MG PO TABS
5.0000 mg | ORAL_TABLET | Freq: Four times a day (QID) | ORAL | Status: DC
  Administered 2021-11-25 (×2): 5 mg
  Filled 2021-11-25 (×2): qty 1

## 2021-11-25 MED ORDER — POTASSIUM CHLORIDE 20 MEQ PO PACK
40.0000 meq | PACK | ORAL | Status: AC
  Administered 2021-11-25 (×2): 40 meq
  Filled 2021-11-25 (×2): qty 2

## 2021-11-25 NOTE — Progress Notes (Signed)
? ? Attending Progress Note ? ?History: Michael Cohen is a 81 y.o presenting after a fall presenting to the ER with confusion and headache. Was found to have Michael Cohen with left sided SDH. S/p left craniotomy for evacuation on 12/04/2021.  ? ?POD8: attempting to wean sedation.  ? ?POD7: Pt remains on sedation. EEG yesterday was negative for subclinical status. ? ?POD6: pt less responsive this morning. Right facial twitching present this am. ? ?POD5: Continues to require intermittent sedation overnight. No significant changes.  ? ?POD4:requiring increased sedation overnight due to vent asynchrony.   ? ?POD3: Remains intubated.  Is more interactive today. ? ?POD2: Pt remains intubated.  He moves BUE and BLE more briskly on the L.  He is currently heavily sedated. ? ?POD1: Pt remains in the ICU on sedation, pressures, and intubated. Neurologically stable. Incision was bloody drainage yesterday evening. Was reinforced with additional staples without any continued issue.  ? ?Physical Exam: ?Vitals:  ? 11/25/21 1600 11/25/21 1627  ?BP: (!) 154/82   ?Pulse: 94   ?Resp: 17   ?Temp: (!) 97.3 ?F (36.3 ?C)   ?SpO2: 96% 100%  ? ?Remains intubated  ?Opens eyes to voice. Leftward gaze ?PERRL. Facial grimace  ?Incision c/d/I ? ?Data: ? ?Recent Labs  ?Lab 11/23/21 ?1443 11/24/21 ?0415 11/25/21 ?0406  ?NA 140 144 140  ?K 3.6 3.0* 3.3*  ?CL 105 107 103  ?CO2 '27 28 31  '$ ?BUN 27* 25* 19  ?CREATININE 0.64 0.54* 0.51*  ?GLUCOSE 165* 198* 203*  ?CALCIUM 8.4* 8.3* 8.2*  ? ? ?Recent Labs  ?Lab 11/25/21 ?0406  ?AST 41  ?ALT 70*  ?ALKPHOS 75  ? ?  ? Recent Labs  ?Lab 11/23/21 ?1540 11/24/21 ?0415 11/25/21 ?0406  ?WBC 14.3* 12.5* 12.5*  ?HGB 11.3* 9.0* 8.8*  ?HCT 36.4* 29.8* 28.2*  ?PLT 295 273 269  ? ? ?No results for input(s): APTT, INR in the last 168 hours. ?  ?   ? ? ?Other tests/results:  ? ?MRI brain 11/23/21 ?IMPRESSION: ?1. No definite empyema or other evidence of intracranial infection. ?2. Interval enlargement of the lateral and third  ventricles, ?concerning for hydrocephalus. ?3. Otherwise stable compared to the 11/19/2021 MRI, with ?redemonstrated hemorrhagic contusions in the bilateral frontal lobes ?and anterior left temporal lobe, subdural, and subarachnoid ?hemorrhage, which are now more apparent given evolving blood ?products. ?  ?  ?Electronically Signed ?  By: Merilyn Baba M.D. ?  On: 11/23/2021 15:25 ?  ? ?Head CT 11/25/2021 (post-op) ?IMPRESSION: ?1. Status post left convexity subdural hematoma evacuation with ?resolution of midline shift. ?2. Residual mixed subdural and subarachnoid blood surrounding the ?cerebellum and over the left convexity, but the dominant component ?of the subdural hematoma has markedly decreased. ?  ?  ?Electronically Signed ?  By: Ulyses Jarred M.D. ?  On: 11/29/2021 19:03 ? ?MRI Brain 11/19/21 ?IMPRESSION: ?No ischemic infarction identified. Comparing the MRI to the most ?recent CT of earlier today, no change is appreciated. Sequela of ?hemorrhagic contusion of both frontal lobes and in the anterior left ?temporal lobe. Widespread subarachnoid blood and dependent ?intraventricular blood with stable ventricular size. Stable subdural ?blood along the medial aspect of the middle cranial fossa on the ?left, along the falx posteriorly and along the tentorium. ?  ?  ?Electronically Signed ?  By: Nelson Chimes M.D. ?  On: 11/19/2021 16:46 ? ?Assessment/Plan: ? ?[name] is a 81 y.o presenting after a fall resulting in tSAH and SDH s/p craniotomy  for SDH evacuation on 11/30/2021. ? ?- EEG negative for underlying seizure activity.  ?- Would continue to take sedation breaks and keep sedation and light as possible.  ?- SBP<160 ?- OK for SQH for Dvt PPX ? ?Cooper Render PA-C ?Department of Neurosurgery ? ?  ?

## 2021-11-25 NOTE — Progress Notes (Signed)
Neurology Progress Note ? ?Patient ID: Michael Cohen is a 81 y.o. with PMHx of  has a past medical history of Diabetes mellitus without complication ([place]), Hypercholesteremia, and Hypertension. And daily drinking, admitted with traumatic SAH, SDH, IPH, IVH, s/p hematoma evacuation 5/4 now with developing hydrocephalus, neurology consulted for focal seizure activity on 5/10 (right facial twitching with left gaze started 5/9 when weaning propofol)  ? ?Interval events/ Subjective: ?- Opening eyes to voice today ?- Per nurse at bedside, no twitching is signed out to her overnight  ?- Triglycerides up, off of prop ? ?Exam: ?Vitals:  ? 11/25/21 0734 11/25/21 0800  ?BP:    ?Pulse:    ?Resp:    ?Temp:    ?SpO2: 100% 100%  ? ?Gen: In bed, comfortable  ?Resp: non-labored breathing, no grossly audible wheezing, comfortable on the vent, biting tube occassionally ?Cardiac: Perfusing extremities well, Normal sinus rhythm today  ?Abd: soft, nt ? ?Neuro:  ?Mental Status: ?Opens eyes to voice  ?Does not follow any commands ?Cranial Nerves: ?II: No blink to threat on the right, but blinks on the left. Pupils are equal, round, and reactive to light.  ?III,IV, VI/VIII: dysconjugate gaze at rest, doesn't clearly orient to examiner ?V/VII: Eyelash brush response weaker on the right than the left  ?VIII: Intact to voice ?X/XI: Intact cough, no gag  ?XII: Unable to assess tongue protrusion secondary to patient's mental status  ?Motor/Sensory: More brisk on the right than the left, more brisk in uppers than lowers but withdraws throughout ?Cerebellar: ?Unable to assess secondary to patient's mental status  ?  ?Pertinent Data: ?Ammonia 15 ?LFTs improving AST 60 -> 41, ALT 92 -> 70 ?BUN 19, Cr 0.51 ?Hypokalemic to 3.3 ?Leukocytosis stable at 12.5 ?Hemogloblin stable (range 9 - 12 recently), 8.8 today ?VPA level 26 (post 2000 mg load, likely not yet steady state); repeat pending ? ?5/10 EEG personally reviewed, agree with Dr.  Hortense Ramal read,  slow with left temporal breach artifact and no ongoing seizure activity ? ?Impression: Focal motor status with her right face twitching and left gaze deviation, now resolved. Has tolerated versed wean yesterday, will d/c prop today  ? ?Recommendations: ?# Focal seizures with right facial twitching ?- Keppra continue 1500 mg BID, will adjust as needed for renal function:  ?Estimated Creatinine Clearance: 84.1 mL/min (A) (by C-G formula based on SCr of 0.51 mg/dL (L)).  ? CrCl 80 to 130 mL/minute/1.73 m2: 500 mg to 1.5 g every 12 hours. ? CrCl 50 to <80 mL/minute/1.73 m2: 500 mg to 1 g every 12 hours. ? CrCl 30 to <50 mL/minute/1.73 m2: 250 to 750 mg every 12 hours. ? CrCl 15 to <30 mL/minute/1.73 m2: 250 to 500 mg every 12 hours. ? CrCl <15 mL/minute/1.73 m2: 250 to 500 mg every 24 hours (expert opinion). ?-Continue propofol at this time ?-Wean versed  ?-Continue Depakote, trend LFTs and ammonia, increasing dose to 15 mg/kg/day given low post-load level,  ?-recheck level today at 2100 prior to 2200 dose (ordered) ?-EKG, if no evidence of heart block will consider Vimpat as next agent if further seizure activity as propofol weaned ? ?# B12 deficiency ? - 1000 mcg IM daily x 7 days then 1000 mcg PO daily ? ?#Atrial fibrillation on monitor ?-Anticoagulation contraindicated in the setting of his acute subdural hemorrhage ?-reverted to normal sinus after amio bolus 5/10 ?-If review of tele confirms atrial fibrillation, long-term would recommend anticoagulation for stroke prevention when safe to do so from a  neurosurgical perspective ? ?Appreciate neurosurgery and ICU co-management of other medical issues, family and ICU team updated at bedside ? ?Lesleigh Noe MD-PhD ?[facility] ?[phone]  ? ?35  min of critical care today ?

## 2021-11-25 NOTE — Progress Notes (Addendum)
PHARMACY CONSULT NOTE  ? ?Pharmacy Consult for Electrolyte Monitoring and Replacement  ? ?Recent Labs: ?Potassium (mmol/L)  ?Date Value  ?11/25/2021 3.3 (L)  ? ?Magnesium (mg/dL)  ?Date Value  ?11/25/2021 2.4  ? ?Calcium (mg/dL)  ?Date Value  ?11/25/2021 8.2 (L)  ? ?Albumin (g/dL)  ?Date Value  ?11/25/2021 1.9 (L)  ? ?Phosphorus (mg/dL)  ?Date Value  ?11/25/2021 4.0  ? ?Sodium (mmol/L)  ?Date Value  ?11/25/2021 140  ? ?Assessment: ?81 year old male with PMH of HTN, T2DM, ETOH use, HLD admitted with subarachnoid hemorrhage. S/p craniotomy. ? ?Nutrition: Vital HP at 65>55 mL/hr (1.3L) + free water flushes 30>50 mL q4h (349m) ? ?Goal of Therapy:  ?Electrolytes within normal limits ? ?Plan:  ?Scr stable at baseline; UOP from 0.7>1.2 ml/k/h (lasix '40mg'$  IV x1) ?K 3.0>3.3 (Mg 2.4); in response to KCL 460m PO x1 ?Will replete with KCL 4045mPO q2h x2 doses. anticipate further lasix. ?Follow-up electrolytes with AM labs tomorrow ? ?BraLorna DibbleharmD, BCCP ?Clinical Pharmacist ?11/25/2021 8:10 AM ? ?

## 2021-11-25 NOTE — Progress Notes (Signed)
? ?NAME:  Michael Cohen, MRN:  [id number], DOB:  1941-03-14, LOS: 8 ?ADMISSION DATE:  11/21/2021, CONSULTATION DATE:  11/15/2021 ?REFERRING MD:  Dr. Sidney Ace, CHIEF COMPLAINT: Fall   ? ?History of Present Illness:  ?81 year old male presenting to [facility] ED from [facility] on 11/15/2021 via EMS.  Per the patient's wife, he had gone out to walk the dog that evening and had fallen in the entryway hitting his head on the corner of the baseboard.  She reported he was altered post fall with complaints of a headache. Patient is not on systemic blood thinners but does take a baby aspirin every other day for heart health.  Wife confirms that the patient drinks 2 scotches daily and sometimes wine with dinner, unclear the exact amount of scotch.  She denies any other recent complaints.  No reports of witnessed seizure-like activity and the patient did confirm that he had drinking alcohol that evening. ?ED course: ?Head CT showed large subarachnoid hemorrhage.  Follow-up CTA revealed no aneurysm.  EDP spoke with Dr. Cari Caraway with neurosurgery who initially recommended admitting to hospitalist service with repeat CT in 6 hours.  TRH admitted patient to stepdown unit.  However, patient still boarding in ED when he became agitated attempting to get out of bed and then vomited.  At this point EDP described patient as having gurgling respirations with SPO2 83% on room air.  Patient was emergently intubated requiring mechanical ventilatory support.  Stat repeat head CT revealed new subdural bleed with 7 mm midline shift.  Dr. Cari Caraway from neurosurgery made aware of change, discussed options bedside with wife and the decision was made to take the patient urgently to the OR and transferred directly to ICU. ?medications given: Keppra 1000 mg, mannitol 50 g, IV contrast, fentanyl, 1 L NS, Tdap, banana bag, etomidate, succinylcholine, propofol drip started ?Initial Vitals: 97.3 F, 17, 62, 117/67 and SPO2 95% on room air ?Significant labs:  (Labs/ Imaging personally reviewed) ?I, Domingo Pulse [place], AGACNP-BC, personally viewed and interpreted this ECG. ?EKG Interpretation: Date: 11/15/2021, EKG Time: 23: 55, Rate: 63, Rhythm: First-degree heart block, QRS Axis: Normal,  ?Intervals: First-degree heart block, borderline prolonged QTc, ST/T Wave abnormalities: None, Narrative Interpretation: NSR with first-degree heart block ?Chemistry: Na+:139, K+: 3.3, BUN/Cr.: 8/0.67, Serum CO2/ AG: 24/13 ?Hematology: WBC: 8.2, Hgb: 12.5,  ?Troponin: 6 >5, COVID-19 & Influenza A/B: pending ?ABG: pending ?CXR 12/02/2021: Low lung volumes with probable areas of passive subsegmental atelectasis, as above ?CT head Wo contrast 12/06/2021: Diffuse subarachnoid hemorrhage as described above concentrated predominately in the left sylvian fissure. 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 CTA of the head is ?recommended for further evaluation. Chronic atrophic and ischemic changes are noted ?CT cervical spine 12/07/2021: Degenerative change without acute abnormality ?CT angio head 11/25/2021: No aneurysm, occlusion or high-grade stenosis of the intracranial arteries ?CT head wo contrast 11/21/2021: New subdural hematoma along the majority of the left cerebral convexity measuring up to 1 cm in thickness and primarily responsible for new 7 mm midline shift. Some thickening of subarachnoid hemorrhage although stable pattern. Trace subdural hematoma along the anterior right frontal convexity. Early visualization of cerebral contusion in the bilateral inferior frontal and left temporal lobes. ? ?PCCM consulted due to deterioration requiring emergent intubation and mechanical ventilatory support. ? 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 ? ?Micro data:  ?5/4: SARS-CoV-2 and influenza PCR>> negative ?5/4: MRSA PCR>> negative ?5/7: Tracheal  aspirate>>CITROBACTER KOSERI (pansensitive) ?5/8:  Tracheal aspirate>>CITROBACTER KOSERI  ?5/9: MRSA PCR>> negative ? ?Antimicrobials:  ?Unasyn 5/4>> 5/5 ?Azithromycin 5/9 x 1 dose ?Vancomycin 5/9 >>5/11 ?Zosyn 5/9>>5/11 ?Ceftriaxone 5/12>> ? ?Significant Hospital Events: ?Including procedures, antibiotic start and stop dates in addition to other pertinent events   ?5/4: Admit to ICU with worsening SAH after emergent intubation on mechanical ventilatory support with plans to go urgently to OR for neurosurgery ?5/5: s/p craniotomy for TRAUMATIC SAH/subdural hematoma, remains on vent ?5/5: remains on  vent, family updated ?5/6: JP drain removed per Neurosurgery  ?5/6: MRI Brain revealed no ischemic infarction identified. Comparing the MRI to  ?     the most recent CT of earlier today, no change is appreciated. Sequela of   ?     hemorrhagic contusion of both frontal lobes and in the anterior left temporal  ?     lobe. Widespread subarachnoid blood and dependent intraventricular blood with  ?     stable ventricular size. Stable subdural blood along the medial aspect of the  ?     middle cranial fossa on the left, along the falx posteriorly and along the  ?     tentorium. ?5/6: CT Head revealed multifocal intracranial hemorrhage following trauma and  ?      surgical evacuation of left side subdural has not significantly changed since    ?      12/02/2021. Conspicuous cerebral edema throughout the left temporal lobe and ?       in both inferior frontal gyri is favored to be posttraumatic, and is at least in part  ?       due to hemorrhagic cerebral contusions. Mild rightward midline shift of 4-5 mm  ?       has slightly increased, but there is no other progressive intracranial mass effect.  ?       A moderate volume of intraventricular hemorrhage has increased, but ventricle  ?       size and configuration remains stable. ?5/8: Overnight propofol gtt started due to vent asynchrony overnight.  Pt unable to follow commands.   ?5/8: EEG suggestive of cortical dysfunction in  left frontotemporal region likely secondary to underlying structural abnormality as well as moderate to severe diffuse encephalopathy, nonspecific to etiology.  No seizures or definite epileptiform discharges were seen throughout the recording. ?5/9: Propofol gtt stopped for [facility] neurological exam remains unchanged unable to follow commands.  Pt with increased work of breathing when sedation stopped.  Will diurese with 40 mg iv lasix x1 dose currently 2L positive  ?5/10: Patient with recurrent right lip/mouth twitching concerning for seizures.  EEG pending.  MRI with enlarging ventricles but otherwise no major changes from 5/6 MRI. Neurosurgery does not recommend intervening on enlarged ventricles at this time.  Consult neurology.  PICC placed due to lack of IV access ?5/11: EEG without seizure activity.  Leukocytosis slowly improving, fever resolved.  Diurese with Lasix x1 dose.  ABX deescalated to Rocephin. ?5/12: Transition from Propofol to Versed due to Hypertriglyceridemia.  Lasix 40 mg x1. ? ?Interim History / Subjective:  ?-No acute events noted overnight ?-Afebrile this am, hemodynamically stable, no vasopressors ?-Currently sedated on propofol, able to elicit withdrawal to pain to bilateral lower extremities and left upper extremity ~no right lip twitching noted ?-Leukocytosis unchanged today at 12.5 K (12.5 yesterday) ?-Tracheal aspirate with Citrobacter koseri ~ ABX deescalated to Ceftriaxone ?-Urine output 2.5 L yesterday with diuresis (net +2.9 L since admit), creatinine  remains stable at 0.51 ~ will diurese again today with Lasix 40 mg x1 ?-Transition from propofol to versed due to hypertriglyceridemia ? ?Objective   ?Blood pressure 139/74, pulse 81, temperature (!) 97.2 ?F (36.2 ?C), temperature source Esophageal, resp. rate (!) 22, height '5\' 10"'$  (1.778 m), weight 88.9 kg, SpO2 100 %. ?   ?Vent Mode: PSV;CPAP ?FiO2 (%):  [30 %-35 %] 30 % ?Set Rate:  [12 bmp] 12 bmp ?Vt Set:  [500 mL] 500 mL ?PEEP:  [5  cmH20] 5 cmH20 ?Pressure Support:  [5 cmH20-10 cmH20] 10 cmH20 ?Plateau Pressure:  [11 cmH20] 11 cmH20  ? ?Intake/Output Summary (Last 24 hours) at 11/25/2021 0802 ?Last data filed at 11/25/2021 0600 ?Gross per 24

## 2021-11-25 NOTE — Progress Notes (Signed)
Nutrition Brief Follow Up Note  ? ?Propofol change  ? ?INTERVENTION:  ? ?Increase Vital 1.5 to 62m/hr + ProSource TF 47mBID via tube ? ?Propofol: discontinued  ? ?Free water flushes 5028m4 hours to maintain tube patency  ? ?Regimen provides 2420kcal/day, 127g/day protein and 1492m14my of free water.  ? ?Estimated Nutritional Needs:  ? ?Kcal:  2100-2400kcal/day ? ?Protein:  120-135g/day ? ?Fluid:  1.9-2.2L/day ? ?CaseKoleen Cohen RD, LDN ?Please refer to AMION for RD and/or RD on-call/weekend/after hours pager ? ?

## 2021-11-25 NOTE — Progress Notes (Signed)
? ?                                                                                                                                                     ?                                                   ?Daily Progress Note  ? ?Patient Name: Michael Cohen       Date: 11/25/2021 ?DOB: 15-May-1941  Age: 81 y.o. MRN#: [id number] ?Attending Physician: Bennie Pierini, MD ?Primary Care Physician: Kirk Ruths, MD ?Admit Date: 12/04/2021 ? ?Reason for Consultation/Follow-up: Establishing goals of care ? ?Subjective: ?Notes and labs reviewed. Spoke with CCM. In to see patient and family. Patient remains on ventilator with poor prognosis. Wife has no questions or concerns, and is being kept updated.  ? ?Time for outcomes, day 8 of intubation. ? ?Length of Stay: 8 ? ?Current Medications: ?Scheduled Meds:  ?  stroke: early stages of recovery book   Does not apply Once  ? atorvastatin  40 mg Per Tube Daily  ? chlorhexidine gluconate (MEDLINE KIT)  15 mL Mouth Rinse BID  ? Chlorhexidine Gluconate Cloth  6 each Topical Daily  ? cholecalciferol  2,000 Units Per Tube Daily  ? cyanocobalamin  1,000 mcg Intramuscular Daily  ? Followed by  ? [START ON 11/30/2021] vitamin B-12  1,000 mcg Per Tube Daily  ? docusate  100 mg Per Tube BID  ? feeding supplement (PROSource TF)  45 mL Per Tube BID  ? folic acid  1 mg Per Tube Daily  ? free water  50 mL Per Tube Q4H  ? heparin injection (subcutaneous)  5,000 Units Subcutaneous Q8H  ? insulin aspart  11 Units Subcutaneous Q4H  ? insulin aspart  2-6 Units Subcutaneous Q4H  ? insulin detemir  34 Units Subcutaneous Q12H  ? ipratropium-albuterol  3 mL Nebulization Q6H  ? loratadine  10 mg Per Tube Daily  ? mouth rinse  15 mL Mouth Rinse 10 times per day  ? midazolam  2 mg Intramuscular Once  ? omega-3 acid ethyl esters  1 g Per Tube Daily  ? oxyCODONE  5 mg Per Tube Q6H  ? pantoprazole sodium  40 mg Per Tube Daily  ? polyethylene glycol  17 g Per Tube Daily  ? sodium chloride flush  10-40  mL Intracatheter Q12H  ? thiamine  100 mg Per Tube Daily  ? ? ?Continuous Infusions: ? cefTRIAXone (ROCEPHIN)  IV Stopped (11/24/21 1337)  ? dextrose    ? feeding supplement (VITAL 1.5 CAL) 1,000 mL (11/25/21 1050)  ? levETIRAcetam 1,500 mg (11/25/21 1105)  ? midazolam Stopped (  11/24/21 1019)  ? valproate sodium 274 mg (11/25/21 1107)  ? ? ?PRN Meds: ?acetaminophen **OR** acetaminophen (TYLENOL) oral liquid 160 mg/5 mL **OR** acetaminophen, dextrose, fentaNYL (SUBLIMAZE) injection, midazolam, ondansetron (ZOFRAN) IV, oxyCODONE-acetaminophen, sodium chloride flush, traZODone ? ?Physical Exam ?Constitutional:   ?   Comments: Eyes closed. On ventilator .  ?         ? ?Vital Signs: BP (!) 149/88   Pulse 99   Temp 98.6 ?F (37 ?C)   Resp (!) 24   Ht 5' 10"  (1.778 m)   Wt 88.9 kg   SpO2 97%   BMI 28.12 kg/m?  ?SpO2: SpO2: 97 % ?O2 Device: O2 Device: Ventilator ?O2 Flow Rate:   ? ?Intake/output summary:  ?Intake/Output Summary (Last 24 hours) at 11/25/2021 1254 ?Last data filed at 11/25/2021 0800 ?Gross per 24 hour  ?Intake 2498.73 ml  ?Output 2335 ml  ?Net 163.73 ml  ? ?LBM: Last BM Date : 11/25/21 ?Baseline Weight: Weight: 86.2 kg ?Most recent weight: Weight: 88.9 kg ? ? ? ?Patient Active Problem List  ? Diagnosis Date Noted  ? SAH (subarachnoid hemorrhage) ([place]) 11/29/2021  ? Fall at home, initial encounter 11/25/2021  ? Essential hypertension 11/15/2021  ? Type 2 diabetes mellitus without complications ([place]) 29/19/1660  ? GERD without esophagitis 11/15/2021  ? ? ?Palliative Care Assessment & Plan  ? ? ?Recommendations/Plan: ? ?Day 8 of ventilator support. Family needs time for outcomes.  ? ? ? ? ?Code Status: ? ?  ?Code Status Orders  ?(From admission, onward)  ?  ? ? ?  ? ?  Start     Ordered  ? 11/19/21 1431  Do not attempt resuscitation (DNR)  Continuous       ?Question Answer Comment  ?In the event of cardiac or respiratory ARREST Do not call a ?code blue?   ?In the event of cardiac or respiratory ARREST Do not  perform Intubation, CPR, defibrillation or ACLS   ?In the event of cardiac or respiratory ARREST Use medication by any route, position, wound care, and other measures to relive pain and suffering. May use oxygen, suction and manual treatment of airway obstruction as needed for comfort.   ?  ? 11/19/21 1430  ? ?  ?  ? ?  ? ?Code Status History   ? ? Date Active Date Inactive Code Status Order ID Comments User Context  ? 11/21/2021 [id number] 11/19/2021 1430 Full Code [id number]  Christel Mormon, MD ED  ? ?  ? ? ?Prognosis: ? Poor  ? ? ?Care plan was discussed with CCM ? ?Thank you for allowing the Palliative Medicine Team to assist in the care of this patient. ? ? ?Asencion Gowda, NP ? ?Please contact Palliative Medicine Team phone at [phone] for questions and concerns.  ? ? ? ? ? ?

## 2021-11-26 ENCOUNTER — Other Ambulatory Visit: Payer: Self-pay

## 2021-11-26 DIAGNOSIS — I609 Nontraumatic subarachnoid hemorrhage, unspecified: Secondary | ICD-10-CM | POA: Diagnosis not present

## 2021-11-26 LAB — GLUCOSE, CAPILLARY
Glucose-Capillary: 116 mg/dL — ABNORMAL HIGH (ref 70–99)
Glucose-Capillary: 133 mg/dL — ABNORMAL HIGH (ref 70–99)
Glucose-Capillary: 154 mg/dL — ABNORMAL HIGH (ref 70–99)
Glucose-Capillary: 162 mg/dL — ABNORMAL HIGH (ref 70–99)
Glucose-Capillary: 166 mg/dL — ABNORMAL HIGH (ref 70–99)
Glucose-Capillary: 171 mg/dL — ABNORMAL HIGH (ref 70–99)
Glucose-Capillary: 205 mg/dL — ABNORMAL HIGH (ref 70–99)

## 2021-11-26 LAB — HEPATIC FUNCTION PANEL
ALT: 66 U/L — ABNORMAL HIGH (ref 0–44)
AST: 48 U/L — ABNORMAL HIGH (ref 15–41)
Albumin: 1.9 g/dL — ABNORMAL LOW (ref 3.5–5.0)
Alkaline Phosphatase: 94 U/L (ref 38–126)
Bilirubin, Direct: 0.1 mg/dL (ref 0.0–0.2)
Indirect Bilirubin: 0.4 mg/dL (ref 0.3–0.9)
Total Bilirubin: 0.5 mg/dL (ref 0.3–1.2)
Total Protein: 5.9 g/dL — ABNORMAL LOW (ref 6.5–8.1)

## 2021-11-26 LAB — BASIC METABOLIC PANEL
Anion gap: 6 (ref 5–15)
BUN: 22 mg/dL (ref 8–23)
CO2: 32 mmol/L (ref 22–32)
Calcium: 8.6 mg/dL — ABNORMAL LOW (ref 8.9–10.3)
Chloride: 105 mmol/L (ref 98–111)
Creatinine, Ser: 0.54 mg/dL — ABNORMAL LOW (ref 0.61–1.24)
GFR, Estimated: >60 mL/min (ref >60)
Glucose, Bld: 223 mg/dL — ABNORMAL HIGH (ref 70–99)
Potassium: 3.9 mmol/L (ref 3.5–5.1)
Sodium: 143 mmol/L (ref 135–145)

## 2021-11-26 LAB — CBC
HCT: 30.7 % — ABNORMAL LOW (ref 39.0–52.0)
Hemoglobin: 9.3 g/dL — ABNORMAL LOW (ref 13.0–17.0)
MCH: 24.9 pg — ABNORMAL LOW (ref 26.0–34.0)
MCHC: 30.3 g/dL (ref 30.0–36.0)
MCV: 82.1 fL (ref 80.0–100.0)
Platelets: 331 10*3/uL (ref 150–400)
RBC: 3.74 MIL/uL — ABNORMAL LOW (ref 4.22–5.81)
RDW: 16.9 % — ABNORMAL HIGH (ref 11.5–15.5)
WBC: 15.4 10*3/uL — ABNORMAL HIGH (ref 4.0–10.5)
nRBC: 0 % (ref 0.0–0.2)

## 2021-11-26 LAB — MAGNESIUM: Magnesium: 2.4 mg/dL (ref 1.7–2.4)

## 2021-11-26 LAB — PHOSPHORUS: Phosphorus: 4 mg/dL (ref 2.5–4.6)

## 2021-11-26 MED ORDER — VALPROIC ACID 250 MG/5ML PO SOLN
250.0000 mg | Freq: Four times a day (QID) | ORAL | Status: DC
  Administered 2021-11-26 – 2021-11-28 (×6): 250 mg
  Filled 2021-11-26 (×8): qty 5

## 2021-11-26 MED ORDER — LEVETIRACETAM 100 MG/ML PO SOLN
1500.0000 mg | Freq: Two times a day (BID) | ORAL | Status: DC
  Administered 2021-11-26 – 2021-11-28 (×4): 1500 mg
  Filled 2021-11-26 (×4): qty 15

## 2021-11-26 MED ORDER — POTASSIUM CHLORIDE 20 MEQ PO PACK
20.0000 meq | PACK | Freq: Once | ORAL | Status: AC
  Administered 2021-11-26: 20 meq
  Filled 2021-11-26: qty 1

## 2021-11-26 MED ORDER — FUROSEMIDE 10 MG/ML IJ SOLN
40.0000 mg | Freq: Once | INTRAMUSCULAR | Status: AC
  Administered 2021-11-26: 40 mg via INTRAVENOUS
  Filled 2021-11-26: qty 4

## 2021-11-26 NOTE — Progress Notes (Signed)
Neurology Progress Note ? ?Patient ID: Michael Cohen is a 81 y.o. with PMHx of  has a past medical history of Diabetes mellitus without complication ([place]), Hypercholesteremia, and Hypertension. And daily drinking, admitted with traumatic SAH, SDH, IPH, IVH, s/p hematoma evacuation 5/4 now with developing hydrocephalus, neurology consulted for focal seizure activity on 5/10 (right facial twitching with left gaze started 5/9 when weaning propofol)  ? ?Interval events/ Subjective: ?- No acute events, stable ?- Remains on cooling blanket for temperature control  ?- On pressure support on ventilator ?- Per nurse at bedside, no twitching is signed out to her overnight  ? ?Exam: ?Vitals:  ? 11/26/21 0600 11/26/21 0724  ?BP: (!) 153/81   ?Pulse: 96   ?Resp: (!) 25   ?Temp: 98.6 ?F (37 ?C)   ?SpO2: 99% 98%  ? ?Gen: In bed, comfortable  ?Resp: non-labored breathing, no grossly audible wheezing, comfortable on the vent,  ?Cardiac: Perfusing extremities well ?Ext: RUE edema  ?Abd: soft, nt ? ?Neuro:  ?Mental Status: ?Opens eyes to voice  ?Does not follow any commands ?Cranial Nerves: ?II: No blink to threat on the right, but blinks on the left. Pupils are equal, round, and reactive to light.  ?III,IV, VI/VIII: dysconjugate gaze at rest, midline conjugate with noxious stimulation, doesn't clearly orient to examiner ?V/VII: Eyelash brush response weaker on the right than the left  ?VIII: Intact to voice (eye opening) ?X/XI: Intact cough, no gag  ?XII: Unable to assess tongue protrusion secondary to patient's mental status  ?Motor/Sensory: More brisk on the right than the left, more brisk in uppers than lowers but withdraws throughout at best (today only a flicker of movement bilateral LE) ?Cerebellar: ?Unable to assess secondary to patient's mental status  ?  ?Pertinent Data: ?Ammonia 15 ?LFTs stable AST 60 -> 41 -> 48, ALT 92 -> 70 -> 66 ?BUN 22, Cr 0.54 ?Leukocytosis increasing to 15 ?Hemogloblin stable (range 9 - 12  recently), ?VPA level 26 (post 2000 mg load, likely not yet steady state); repeat 27 on 5/12 PM ? ?5/10 EEG personally reviewed, agree with Dr.  Hortense Ramal read, slow with left temporal breach artifact and no ongoing seizure activity ? ?Impression: Focal motor status with her right face twitching and left gaze deviation, now resolved.  Mental status remains poor as patient recovers from the focal status and brain injury, however he is stable from yesterday.  If mental status is not improving tomorrow will repeat brain imaging  ? ?Recommendations: ?# Focal seizures with right facial twitching ?- Keppra continue 1500 mg BID, will adjust if renal function decreases ?-Continue Depakote, trend LFTs, continue 15 mg/kg/d divided q6hr, and increase dose if further seizures occur ?-EKG, if no evidence of heart block will consider Vimpat as next agent if further seizure activity as propofol weaned ?-Repeat head CT 5/14 if no change in mental status or if patient declines ? ?# B12 deficiency ? - 1000 mcg IM daily x 7 days then 1000 mcg PO daily ? ?# paroxysmal Atrial fibrillation on monitor ?-Anticoagulation contraindicated in the setting of his acute subdural hemorrhage ?-reverted to normal sinus after amio bolus 5/10 ?-If review of tele confirms atrial fibrillation, long-term would recommend anticoagulation for stroke prevention when safe to do so from a neurosurgical perspective ? ?Appreciate neurosurgery and ICU co-management of other medical issues, family and ICU team updated at bedside ? ?Lesleigh Noe MD-PhD ?[facility] ?[phone]  ? ?35  min of critical care today ?

## 2021-11-26 NOTE — Progress Notes (Signed)
? ?NAME:  Alek Borges, MRN:  [id number], DOB:  1940-09-19, LOS: 9 ?ADMISSION DATE:  12/01/2021, CONSULTATION DATE:  11/22/2021 ?REFERRING MD:  Dr. Sidney Ace, CHIEF COMPLAINT: Fall   ? ?History of Present Illness:  ?81 year old male presenting to [facility] ED from [facility] on 11/27/2021 via EMS.  Per the patient's wife, he had gone out to walk the dog that evening and had fallen in the entryway hitting his head on the corner of the baseboard.  She reported he was altered post fall with complaints of a headache. Patient is not on systemic blood thinners but does take a baby aspirin every other day for heart health.  Wife confirms that the patient drinks 2 scotches daily and sometimes wine with dinner, unclear the exact amount of scotch.  She denies any other recent complaints.  No reports of witnessed seizure-like activity and the patient did confirm that he had drinking alcohol that evening. ?ED course: ?Head CT showed large subarachnoid hemorrhage.  Follow-up CTA revealed no aneurysm.  EDP spoke with Dr. Cari Caraway with neurosurgery who initially recommended admitting to hospitalist service with repeat CT in 6 hours.  TR[facility] admitted patient to stepdown unit.  However, patient still boarding in ED when he became agitated attempting to get out of bed and then vomited.  At this point EDP described patient as having gurgling respirations with SPO2 83% on room air.  Patient was emergently intubated requiring mechanical ventilatory support.  Stat repeat head CT revealed new subdural bleed with 7 mm midline shift.  Dr. Cari Caraway from neurosurgery made aware of change, discussed options bedside with wife and the decision was made to take the patient urgently to the OR and transferred directly to ICU. ?medications given: Keppra 1000 mg, mannitol 50 g, IV contrast, fentanyl, 1 L NS, Tdap, banana bag, etomidate, succinylcholine, propofol drip started ?Initial Vitals: 97.3 F, 17, 62, 117/67 and SPO2 95% on room air ?Significant labs:  (Labs/ Imaging personally reviewed) ?I, Domingo Pulse Rust[place], AGACNP-BC, personally viewed and interpreted this ECG. ?EKG Interpretation: Date: 12/09/2021, EKG Time: 23: 55, Rate: 63, Rhythm: First-degree heart block, QRS Axis: Normal,  ?Intervals: First-degree heart block, borderline prolonged QTc, ST/T Wave abnormalities: None, Narrative Interpretation: NSR with first-degree heart block ?Chemistry: Na+:139, K+: 3.3, BUN/Cr.: 8/0.67, Serum CO2/ AG: 24/13 ?Hematology: WBC: 8.2, Hgb: 12.5,  ?Troponin: 6 >5, COVID-19 & Influenza A/B: pending ?ABG: pending ?CXR 11/19/2021: Low lung volumes with probable areas of passive subsegmental atelectasis, as above ?CT head Wo contrast 12/07/2021: Diffuse subarachnoid hemorrhage as described above concentrated predominately in the left sylvian fissure. 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 CTA of the head is ?recommended for further evaluation. Chronic atrophic and ischemic changes are noted ?CT cervical spine 12/07/2021: Degenerative change without acute abnormality ?CT angio head 11/25/2021: No aneurysm, occlusion or high-grade stenosis of the intracranial arteries ?CT head wo contrast 11/18/2021: New subdural hematoma along the majority of the left cerebral convexity measuring up to 1 cm in thickness and primarily responsible for new 7 mm midline shift. Some thickening of subarachnoid hemorrhage although stable pattern. Trace subdural hematoma along the anterior right frontal convexity. Early visualization of cerebral contusion in the bilateral inferior frontal and left temporal lobes. ? ?PCCM consulted due to deterioration requiring emergent intubation and mechanical ventilatory support. ? 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 ? ?Micro data:  ?5/4: SARS-CoV-2 and influenza PCR>> negative ?5/4: MRSA PCR>> negative ?5/7: Tracheal  aspirate>>CITROBACTER KOSERI (pansensitive) ?5/8:  Tracheal aspirate>>CITROBACTER KOSERI  ?5/9: MRSA PCR>> negative ? ?Antimicrobials:  ?Unasyn 5/4>> 5/5 ?Azithromycin 5/9 x 1 dose ?Vancomycin 5/9 >>5/11 ?Zosyn 5/9>>5/11 ?Ceftriaxone 5/12>> ? ?Significant Hospital Events: ?Including procedures, antibiotic start and stop dates in addition to other pertinent events   ?5/4: Admit to ICU with worsening SAH after emergent intubation on mechanical ventilatory support with plans to go urgently to OR for neurosurgery ?5/5: s/p craniotomy for TRAUMATIC SAH/subdural hematoma, remains on vent ?5/5: remains on  vent, family updated ?5/6: JP drain removed per Neurosurgery  ?5/6: MRI Brain revealed no ischemic infarction identified. Comparing the MRI to  ?     the most recent CT of earlier today, no change is appreciated. Sequela of   ?     hemorrhagic contusion of both frontal lobes and in the anterior left temporal  ?     lobe. Widespread subarachnoid blood and dependent intraventricular blood with  ?     stable ventricular size. Stable subdural blood along the medial aspect of the  ?     middle cranial fossa on the left, along the falx posteriorly and along the  ?     tentorium. ?5/6: CT Head revealed multifocal intracranial hemorrhage following trauma and  ?      surgical evacuation of left side subdural has not significantly changed since    ?      12/05/2021. Conspicuous cerebral edema throughout the left temporal lobe and ?       in both inferior frontal gyri is favored to be posttraumatic, and is at least in part  ?       due to hemorrhagic cerebral contusions. Mild rightward midline shift of 4-5 mm  ?       has slightly increased, but there is no other progressive intracranial mass effect.  ?       A moderate volume of intraventricular hemorrhage has increased, but ventricle  ?       size and configuration remains stable. ?5/8: Overnight propofol gtt started due to vent asynchrony overnight.  Pt unable to follow commands.   ?5/8: EEG suggestive of cortical dysfunction in  left frontotemporal region likely secondary to underlying structural abnormality as well as moderate to severe diffuse encephalopathy, nonspecific to etiology.  No seizures or definite epileptiform discharges were seen throughout the recording. ?5/9: Propofol gtt stopped for [facility] neurological exam remains unchanged unable to follow commands.  Pt with increased work of breathing when sedation stopped.  Will diurese with 40 mg iv lasix x1 dose currently 2L positive  ?5/10: Patient with recurrent right lip/mouth twitching concerning for seizures.  EEG pending.  MRI with enlarging ventricles but otherwise no major changes from 5/6 MRI. Neurosurgery does not recommend intervening on enlarged ventricles at this time.  Consult neurology.  PICC placed due to lack of IV access ?5/11: EEG without seizure activity.  Leukocytosis slowly improving, fever resolved.  Diurese with Lasix x1 dose.  ABX deescalated to Rocephin. ?5/12: Transition from Propofol to Versed due to Hypertriglyceridemia.  Lasix 40 mg x1. ?5/13: Lasix 40 mg iv x1 dose.  Tolerating SBT 12/5 ? ?Interim History / Subjective:  ?No acute events overnight remains mechanically intubated tolerating SBT 12/5 and 30%, however unable to follow commands.  Opens eyes with minimal stimulation and non purposeful movement bilateral lower extremities.  No facial twitching present   ? ?Objective   ?Blood pressure (!) 153/81, pulse 96, temperature 98.6 ?F (37 ?C), temperature source Esophageal,  resp. rate (!) 25, height '5\' 10"'$  (1.778 m), weight 91.8 kg, SpO2 98 %. ?   ?Vent Mode: PSV;CPAP ?FiO2 (%):  [30 %] 30 % ?Set Rate:  [15 bmp] 15 bmp ?Vt Set:  [580 mL] 580 mL ?PEEP:  [5 cmH20] 5 cmH20 ?Pressure Support:  [12 cmH20] 12 cmH20 ?Plateau Pressure:  [6 DTO67-12 cmH20] 24 cmH20  ? ?Intake/Output Summary (Last 24 hours) at 11/26/2021 0741 ?Last data filed at 11/26/2021 0600 ?Gross per 24 hour  ?Intake 2078.93 ml  ?Output 3375 ml  ?Net -1296.07 ml  ? ?Filed Weights  ? 11/24/21 0500  11/25/21 0600 11/26/21 0500  ?Weight: 94.6 kg 88.9 kg 91.8 kg  ? ? ?Examination: ?General: Acute on chronically ill appearing male, severe respiratory distress mechanical ventilation  ?HEENT: Left head incision s

## 2021-11-26 NOTE — Progress Notes (Signed)
PHARMACY CONSULT NOTE  ? ?Pharmacy Consult for Electrolyte Monitoring and Replacement  ? ?Recent Labs: ?Potassium (mmol/L)  ?Date Value  ?11/26/2021 3.9  ? ?Magnesium (mg/dL)  ?Date Value  ?11/26/2021 2.4  ? ?Calcium (mg/dL)  ?Date Value  ?11/26/2021 8.6 (L)  ? ?Albumin (g/dL)  ?Date Value  ?11/26/2021 1.9 (L)  ? ?Phosphorus (mg/dL)  ?Date Value  ?11/26/2021 4.0  ? ?Sodium (mmol/L)  ?Date Value  ?11/26/2021 143  ? ?Assessment: ?81 year old male with PMH of HTN, T2DM, ETOH use, HLD admitted with subarachnoid hemorrhage. S/p craniotomy. ? ?Nutrition: Vital HP at 65>55 mL/hr (1.3L) + free water flushes 30>50 mL q4h (379m) ? ?Goal of Therapy:  ?Electrolytes within normal limits ? ?Plan:  ?Scr stable at baseline ?K 3.9 ?Patient ordered lasix '40mg'$  IV x1 5/13 this am ?Will order KCL 273m packet  x1dose.  ?Follow-up electrolytes with AM labs tomorrow ? ?KrChinita GreenlandharmD ?Clinical Pharmacist ?11/26/2021 ? ?11/26/2021 9:05 AM ? ?

## 2021-11-26 NOTE — Progress Notes (Signed)
Updated pts wife regarding pt condition and current plan of care at bedside.   ? ?Donell Beers, AGNP  ?Pulmonary/Critical Care ?Pager [phone] (please enter 7 digits) ?PCCM Consult Pager [phone] (please enter 7 digits)  ?

## 2021-11-26 NOTE — Progress Notes (Addendum)
? ? Attending Progress Note ? ?History: Michael Cohen is a 81 y.o presenting after a fall presenting to the ER with confusion and headache. Was found to have [place] with left sided SDH. S/p left craniotomy for evacuation on 12/04/2021.  ? ?POD9: Sedation off this morning, exam not improved. Continues on multiple AEDs ? ?POD8: attempting to wean sedation.  ? ?POD7: Pt remains on sedation. EEG yesterday was negative for subclinical status. ? ?POD6: pt less responsive this morning. Right facial twitching present this am. ? ?POD5: Continues to require intermittent sedation overnight. No significant changes.  ? ?POD4:requiring increased sedation overnight due to vent asynchrony.   ? ?POD3: Remains intubated.  Is more interactive today. ? ?POD2: Pt remains intubated.  He moves BUE and BLE more briskly on the L.  He is currently heavily sedated. ? ?POD1: Pt remains in the ICU on sedation, pressures, and intubated. Neurologically stable. Incision was bloody drainage yesterday evening. Was reinforced with additional staples without any continued issue.  ? ?Physical Exam: ?Vitals:  ? 11/26/21 0600 11/26/21 0724  ?BP: (!) 153/81   ?Pulse: 96   ?Resp: (!) 25   ?Temp: 98.6 ?F (37 ?C)   ?SpO2: 99% 98%  ? ?Remains intubated  ?No eye opening ?PERRL. + corneals, cough ?Facial grimace to stimuli ?Localize LUE, no movement right to stimuli ?Incision c/d/I ? ?Data: ? ?Recent Labs  ?Lab 11/24/21 ?0415 11/25/21 ?0406 11/26/21 ?2202  ?NA 144 140 143  ?K 3.0* 3.3* 3.9  ?CL 107 103 105  ?CO2 28 31 32  ?BUN 25* 19 22  ?CREATININE 0.54* 0.51* 0.54*  ?GLUCOSE 198* 203* 223*  ?CALCIUM 8.3* 8.2* 8.6*  ? ? ?Recent Labs  ?Lab 11/26/21 ?0412  ?AST 48*  ?ALT 66*  ?ALKPHOS 94  ? ?  ? Recent Labs  ?Lab 11/24/21 ?0415 11/25/21 ?0406 11/26/21 ?5427  ?WBC 12.5* 12.5* 15.4*  ?HGB 9.0* 8.8* 9.3*  ?HCT 29.8* 28.2* 30.7*  ?PLT 273 269 331  ? ? ?No results for input(s): APTT, INR in the last 168 hours. ?  ?   ? ? ?Other tests/results:  ? ?MRI brain  11/23/21 ?IMPRESSION: ?1. No definite empyema or other evidence of intracranial infection. ?2. Interval enlargement of the lateral and third ventricles, ?concerning for hydrocephalus. ?3. Otherwise stable compared to the 11/19/2021 MRI, with ?redemonstrated hemorrhagic contusions in the bilateral frontal lobes ?and anterior left temporal lobe, subdural, and subarachnoid ?hemorrhage, which are now more apparent given evolving blood ?products. ?  ?  ?Electronically Signed ?  By: Merilyn Baba M.D. ?  On: 11/23/2021 15:25 ?  ? ?Head CT 12/05/2021 (post-op) ?IMPRESSION: ?1. Status post left convexity subdural hematoma evacuation with ?resolution of midline shift. ?2. Residual mixed subdural and subarachnoid blood surrounding the ?cerebellum and over the left convexity, but the dominant component ?of the subdural hematoma has markedly decreased. ?  ?  ?Electronically Signed ?  By: Ulyses Jarred M.D. ?  On: 12/09/2021 19:03 ? ?MRI Brain 11/19/21 ?IMPRESSION: ?No ischemic infarction identified. Comparing the MRI to the most ?recent CT of earlier today, no change is appreciated. Sequela of ?hemorrhagic contusion of both frontal lobes and in the anterior left ?temporal lobe. Widespread subarachnoid blood and dependent ?intraventricular blood with stable ventricular size. Stable subdural ?blood along the medial aspect of the middle cranial fossa on the ?left, along the falx posteriorly and along the tentorium. ?  ?  ?Electronically Signed ?  By: Nelson Chimes M.D. ?  On: 11/19/2021 16:46 ? ?  Assessment/Plan: ? ?[name] is a 81 y.o presenting after a fall resulting in tSAH and SDH s/p craniotomy for SDH evacuation on 11/29/2021. ? ?- EEG negative for underlying seizure activity.  ?- Continue supportive crae ?- SBP<160 ?- OK for SQH for Dvt PPX ? ?Deetta Perla, MD ?Department of Neurosurgery ? ?  ?

## 2021-11-27 ENCOUNTER — Inpatient Hospital Stay: Payer: Medicare Other

## 2021-11-27 DIAGNOSIS — I609 Nontraumatic subarachnoid hemorrhage, unspecified: Secondary | ICD-10-CM | POA: Diagnosis not present

## 2021-11-27 LAB — HEPATIC FUNCTION PANEL
ALT: 70 U/L — ABNORMAL HIGH (ref 0–44)
AST: 57 U/L — ABNORMAL HIGH (ref 15–41)
Albumin: 2 g/dL — ABNORMAL LOW (ref 3.5–5.0)
Alkaline Phosphatase: 108 U/L (ref 38–126)
Bilirubin, Direct: 0.2 mg/dL (ref 0.0–0.2)
Indirect Bilirubin: 0.2 mg/dL — ABNORMAL LOW (ref 0.3–0.9)
Total Bilirubin: 0.4 mg/dL (ref 0.3–1.2)
Total Protein: 5.9 g/dL — ABNORMAL LOW (ref 6.5–8.1)

## 2021-11-27 LAB — BLOOD GAS, ARTERIAL
Acid-Base Excess: 6.9 mmol/L — ABNORMAL HIGH (ref 0.0–2.0)
Bicarbonate: 30.3 mmol/L — ABNORMAL HIGH (ref 20.0–28.0)
FIO2: 35 %
MECHVT: 500 mL
Mechanical Rate: 12
O2 Saturation: 100 %
PEEP: 5 cmH2O
Patient temperature: 37
pCO2 arterial: 38 mmHg (ref 32–48)
pH, Arterial: 7.51 — ABNORMAL HIGH (ref 7.35–7.45)
pO2, Arterial: 153 mmHg — ABNORMAL HIGH (ref 83–108)

## 2021-11-27 LAB — CBC
HCT: 32.5 % — ABNORMAL LOW (ref 39.0–52.0)
Hemoglobin: 9.7 g/dL — ABNORMAL LOW (ref 13.0–17.0)
MCH: 24.9 pg — ABNORMAL LOW (ref 26.0–34.0)
MCHC: 29.8 g/dL — ABNORMAL LOW (ref 30.0–36.0)
MCV: 83.5 fL (ref 80.0–100.0)
Platelets: 368 10*3/uL (ref 150–400)
RBC: 3.89 MIL/uL — ABNORMAL LOW (ref 4.22–5.81)
RDW: 16.8 % — ABNORMAL HIGH (ref 11.5–15.5)
WBC: 25.6 10*3/uL — ABNORMAL HIGH (ref 4.0–10.5)
nRBC: 0.1 % (ref 0.0–0.2)

## 2021-11-27 LAB — GLUCOSE, CAPILLARY
Glucose-Capillary: 115 mg/dL — ABNORMAL HIGH (ref 70–99)
Glucose-Capillary: 122 mg/dL — ABNORMAL HIGH (ref 70–99)
Glucose-Capillary: 34 mg/dL — CL (ref 70–99)
Glucose-Capillary: 73 mg/dL (ref 70–99)
Glucose-Capillary: 203 mg/dL — ABNORMAL HIGH (ref 70–99)
Glucose-Capillary: 176 mg/dL — ABNORMAL HIGH (ref 70–99)
Glucose-Capillary: 171 mg/dL — ABNORMAL HIGH (ref 70–99)

## 2021-11-27 LAB — BASIC METABOLIC PANEL
Anion gap: 10 (ref 5–15)
BUN: 25 mg/dL — ABNORMAL HIGH (ref 8–23)
CO2: 34 mmol/L — ABNORMAL HIGH (ref 22–32)
Calcium: 9 mg/dL (ref 8.9–10.3)
Chloride: 101 mmol/L (ref 98–111)
Creatinine, Ser: 0.47 mg/dL — ABNORMAL LOW (ref 0.61–1.24)
GFR, Estimated: >60 mL/min (ref >60)
Glucose, Bld: 74 mg/dL (ref 70–99)
Potassium: 3.4 mmol/L — ABNORMAL LOW (ref 3.5–5.1)
Sodium: 145 mmol/L (ref 135–145)

## 2021-11-27 LAB — PHOSPHORUS: Phosphorus: 4.2 mg/dL (ref 2.5–4.6)

## 2021-11-27 LAB — MAGNESIUM: Magnesium: 2.8 mg/dL — ABNORMAL HIGH (ref 1.7–2.4)

## 2021-11-27 IMAGING — DX DG CHEST 1V PORT
1 series · 1 of 1 positions shown · non-contrast
Comparison: [DATE]

CLINICAL DATA: Ventilator dependence.

EXAM:
PORTABLE CHEST 1 VIEW

[chest ap]
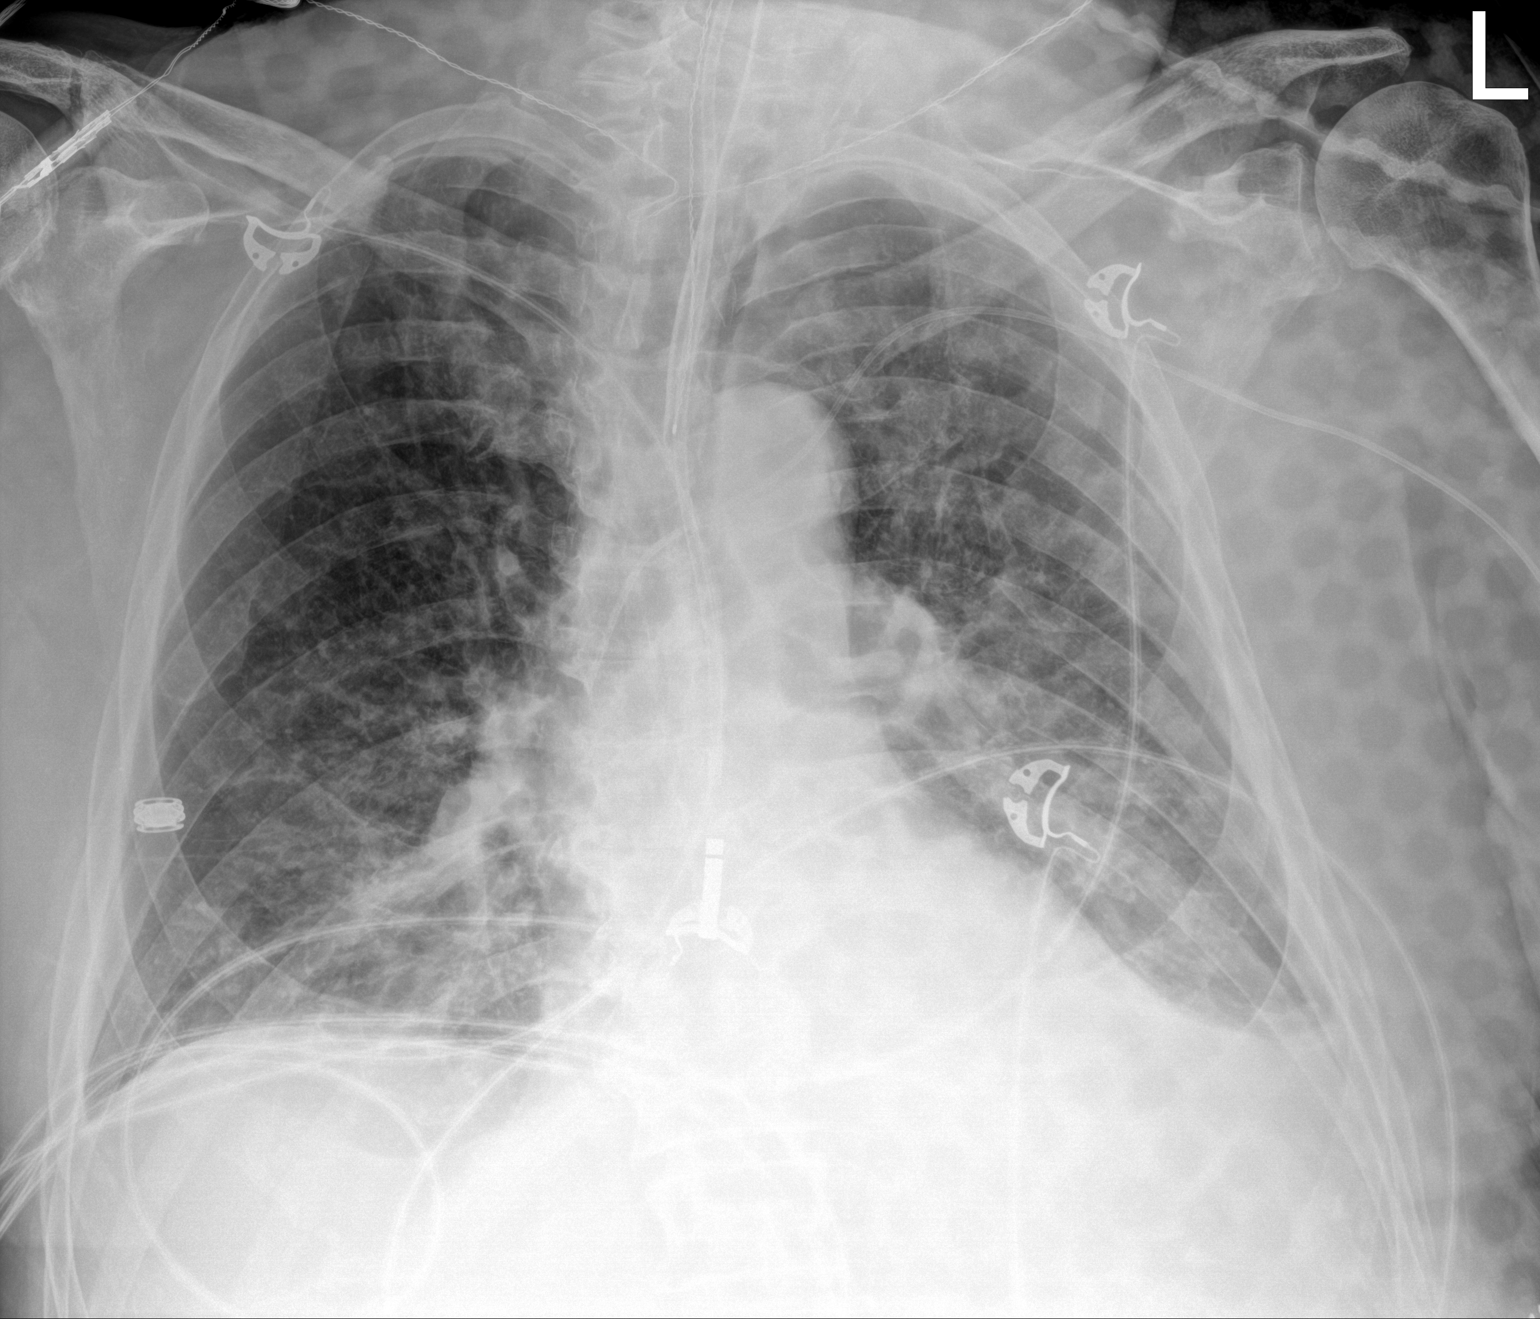

[1 of 1 positions shown; findings below may reference images not displayed]

FINDINGS: [0V] hours. Endotracheal tube tip is 4.1 cm above the base of the
carina. Feeding tube tip is again noted in the distal esophagus.
Esophageal temperature probe noted. Left PICC line tip overlies the
mid SVC level. Bibasilar collapse/consolidation evident, left more
than right. Tiny left pleural effusion noted. Telemetry leads
overlie the chest.
IMPRESSION: 1. Endotracheal tube tip is 4.1 cm above the base of the carina.
2. Feeding tube tip is again noted in the distal esophagus.
3. Bibasilar collapse/consolidation greater left than right with
small left pleural effusion.

## 2021-11-27 IMAGING — DX DG ABD PORTABLE 1V
2 series · 2 of 2 positions shown · non-contrast
Comparison: [DATE]

CLINICAL DATA: Vomiting.  Feeding tube placement.

EXAM:
PORTABLE ABDOMEN - 1 VIEW

[abdomen supine (1 of 2)]
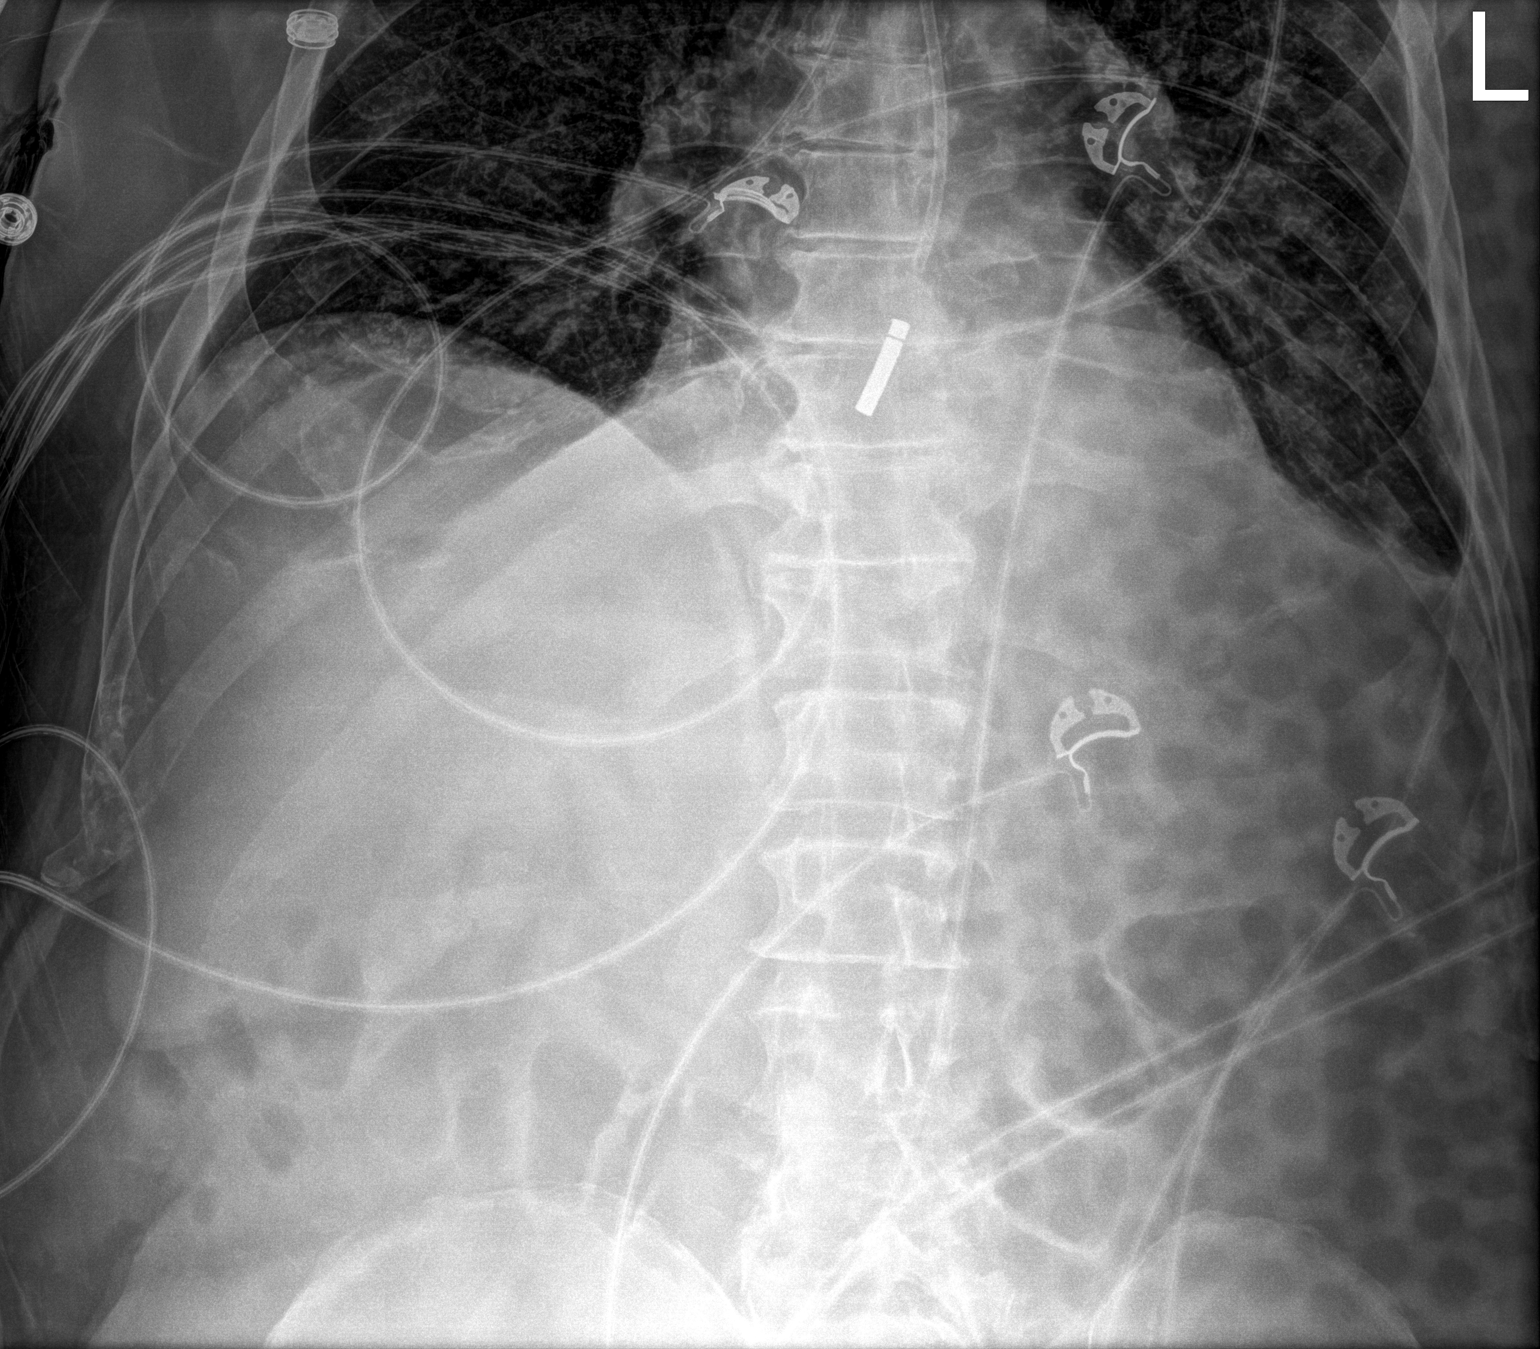

[abdomen supine (2 of 2)]
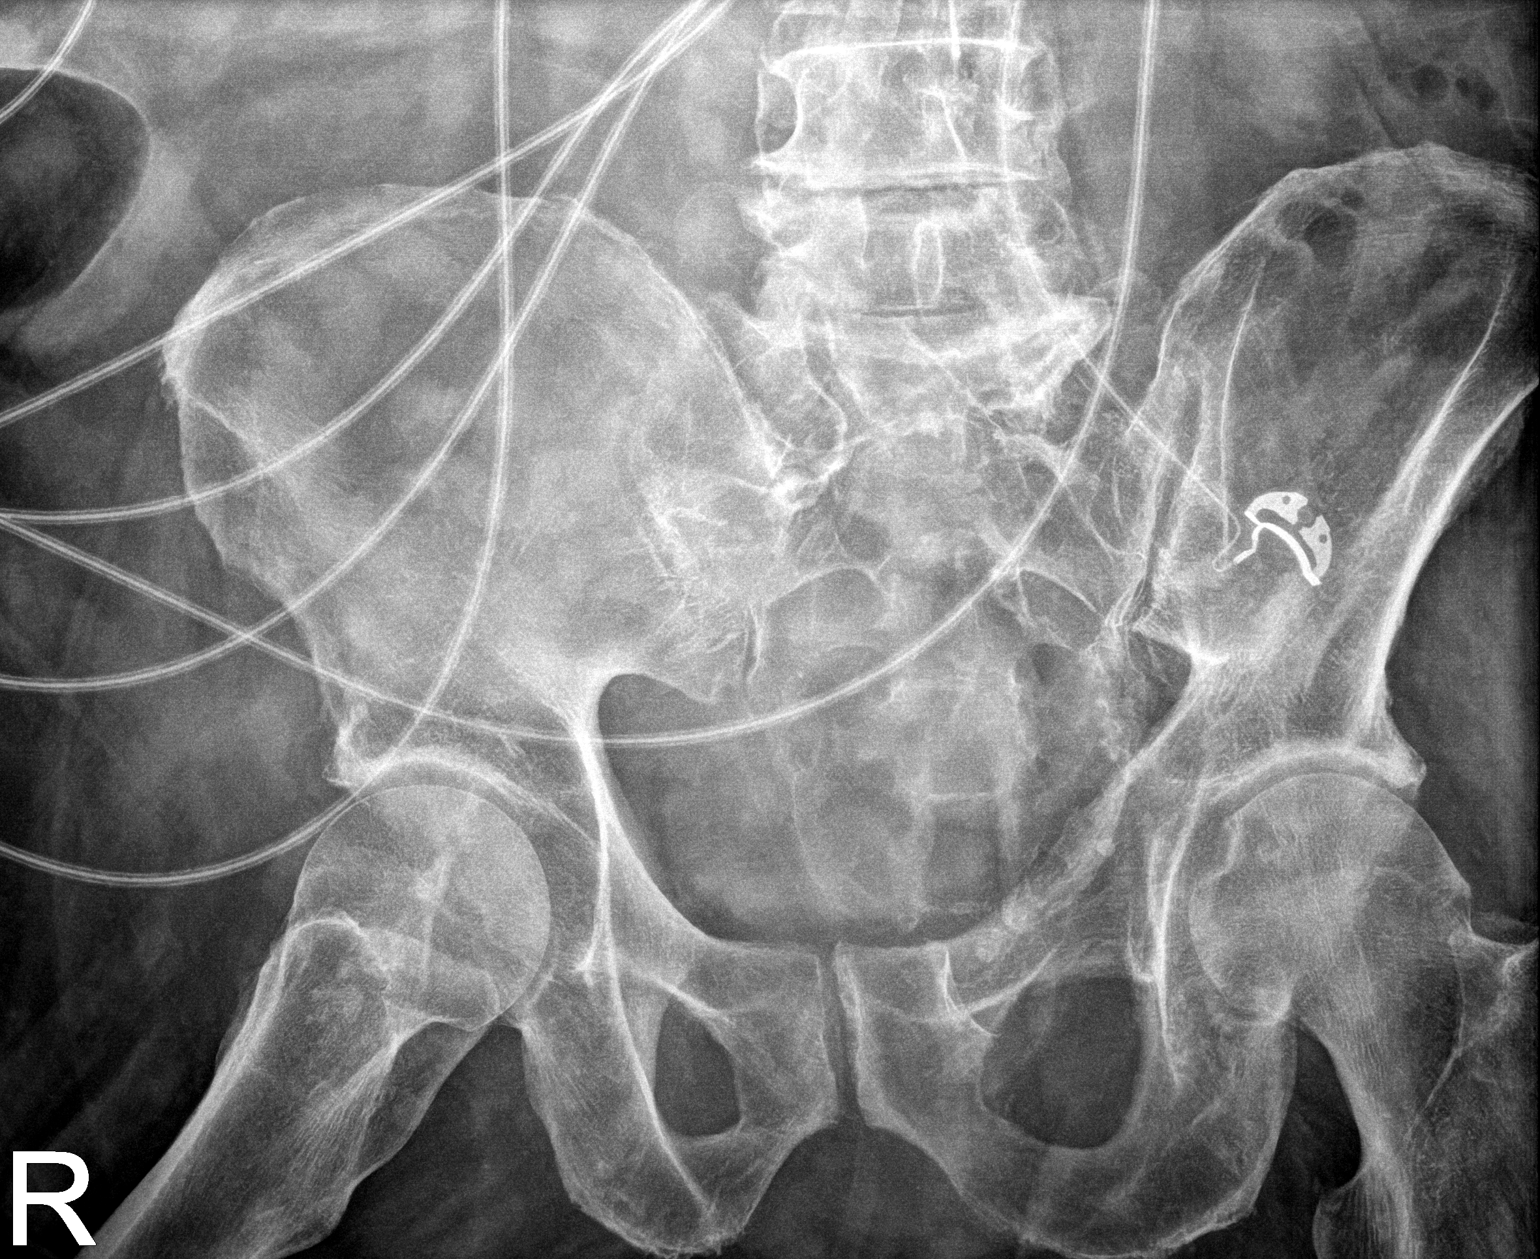

[2 of 2 positions shown; findings below may reference images not displayed]

FINDINGS: Feeding tube tip overlies the distal esophagus. Visualized upper
abdomen shows a nonspecific bowel gas pattern. Retrocardiac
collapse/consolidation evident with probable small left pleural
effusion.
IMPRESSION: 1. Feeding tube tip overlies the distal esophagus.
2. Retrocardiac collapse/consolidation with probable small left
pleural effusion.

## 2021-11-27 IMAGING — DX DG ABDOMEN 1V
1 series · 1 of 1 positions shown · non-contrast
Comparison: Earlier same day

CLINICAL DATA: Feeding tube placement.

EXAM:
ABDOMEN - 1 VIEW

[abdomen supine]
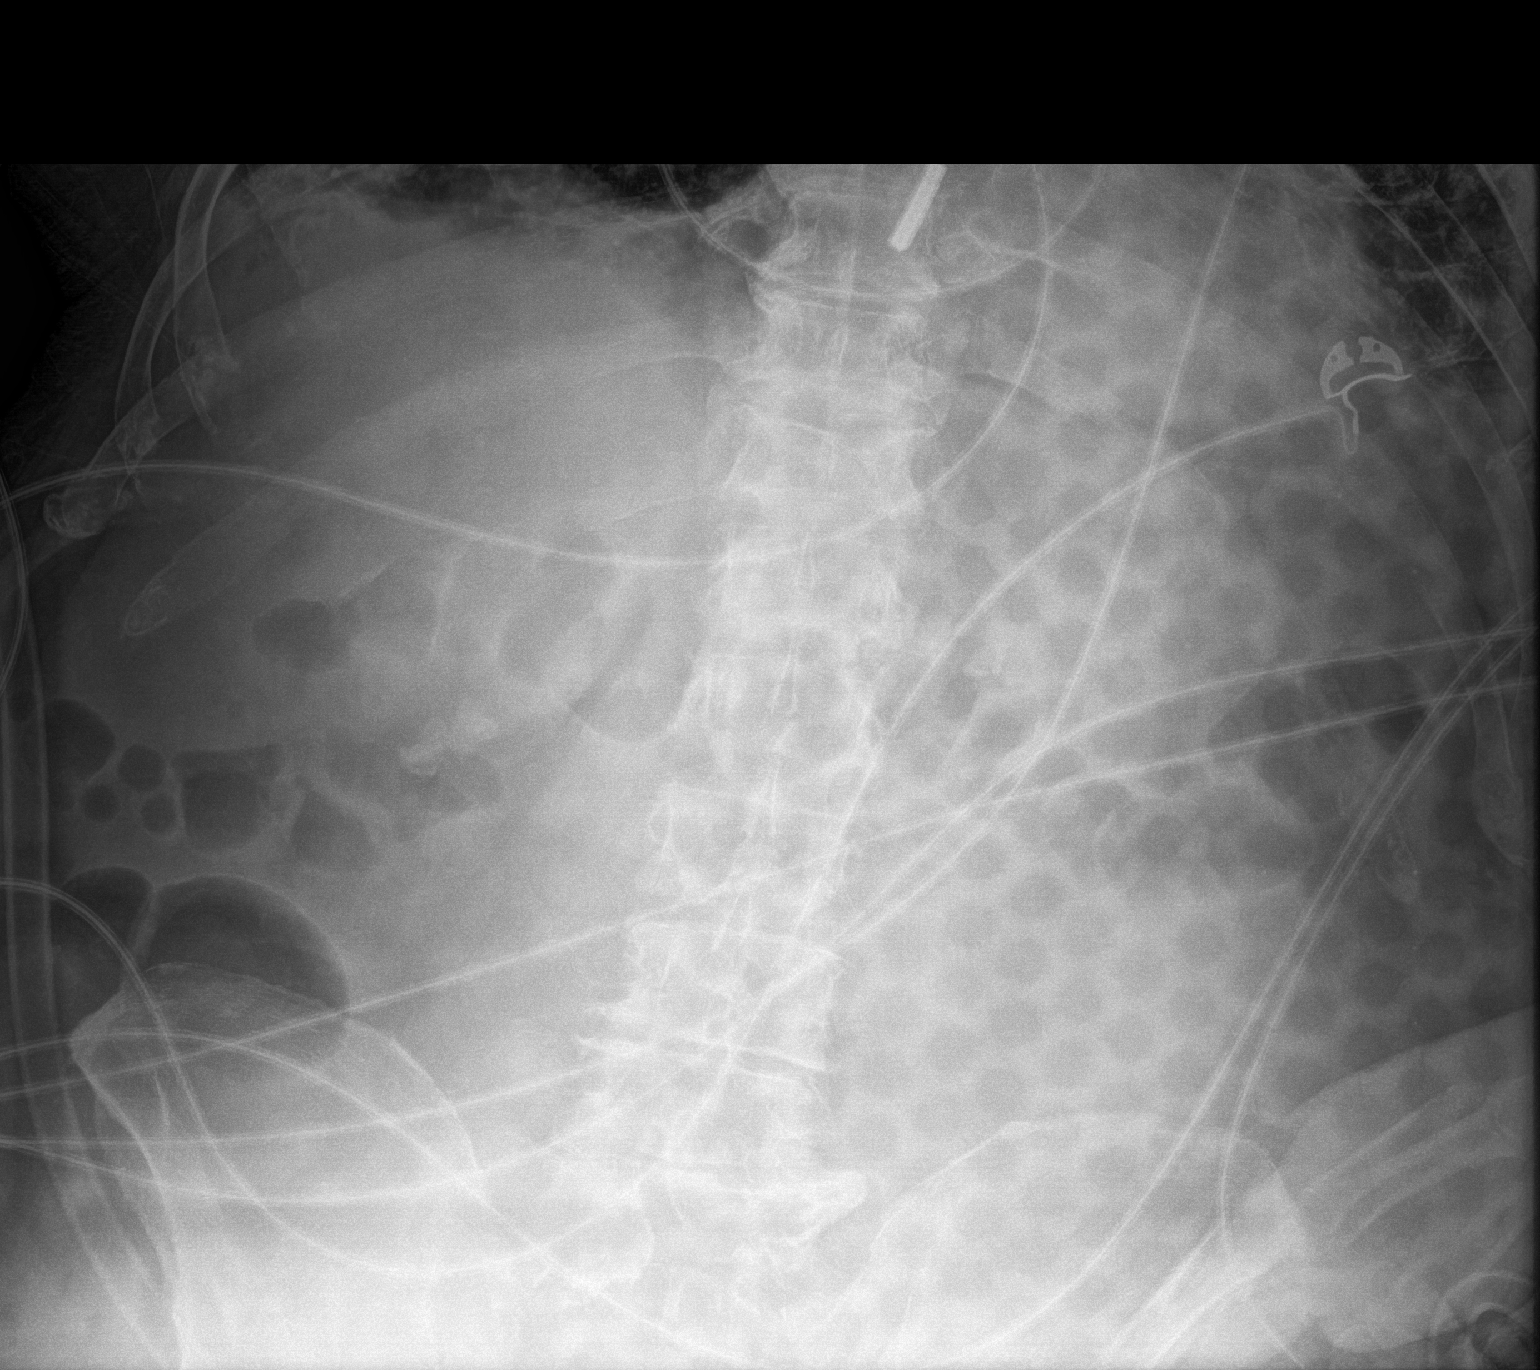

[1 of 1 positions shown; findings below may reference images not displayed]

FINDINGS: [0C] hours. Feeding tube tip projects over the expected location of
the lower esophagus. Visualized abdomen demonstrates nonspecific
bowel gas pattern.
IMPRESSION: Feeding tube tip again projects over the lower esophagus.

## 2021-11-27 IMAGING — CT CT HEAD W/O CM
4 series · 16 of 47 positions shown, 18 images · non-contrast
Comparison: Head MRI [DATE] and head CT [DATE]

CLINICAL DATA: Follow up intracranial hemorrhage and hydrocephalus.



[Series 2: head wo · axial · 0.47mm/px · z∈[-210,-60]mm · 7 of 41 slices shown, 9 images]
[im 6/41  brain]
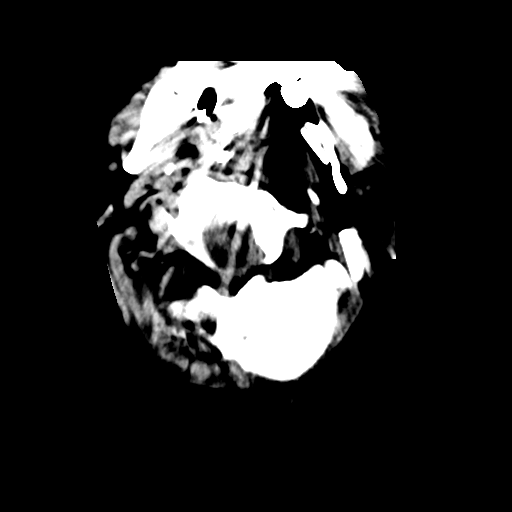
[im 6/41  bone]
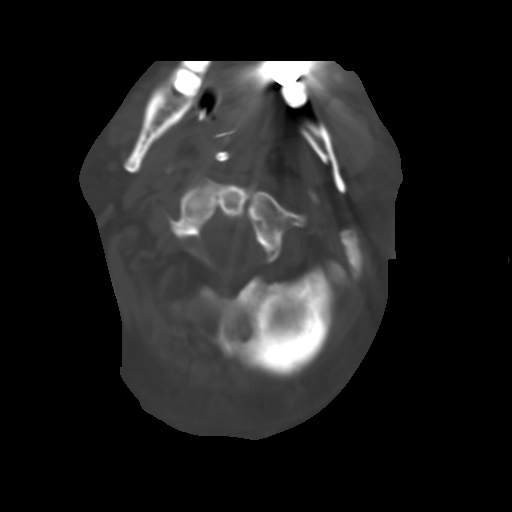
[im 11/41  brain]
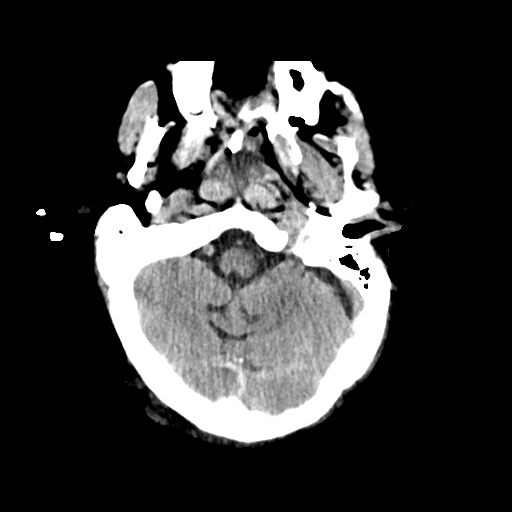
[im 16/41  brain]
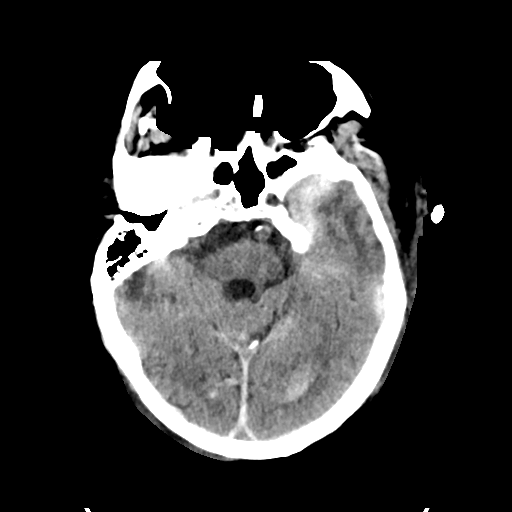
[im 21/41  brain]
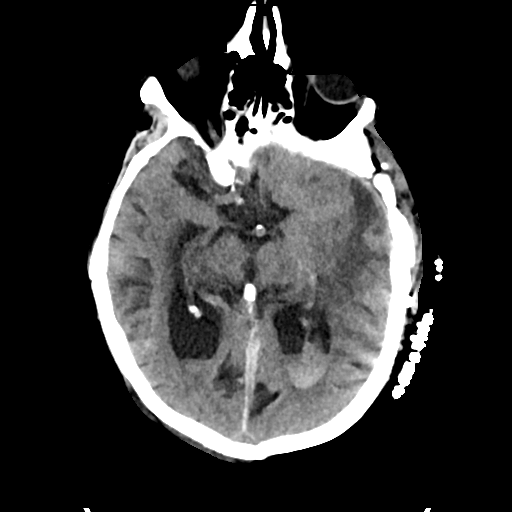
[im 26/41  brain]
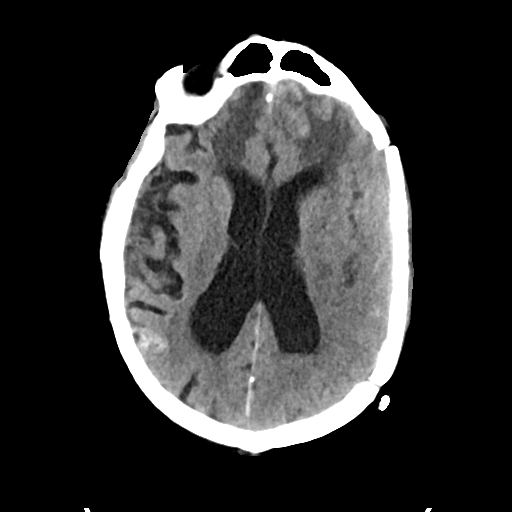
[im 26/41  bone]
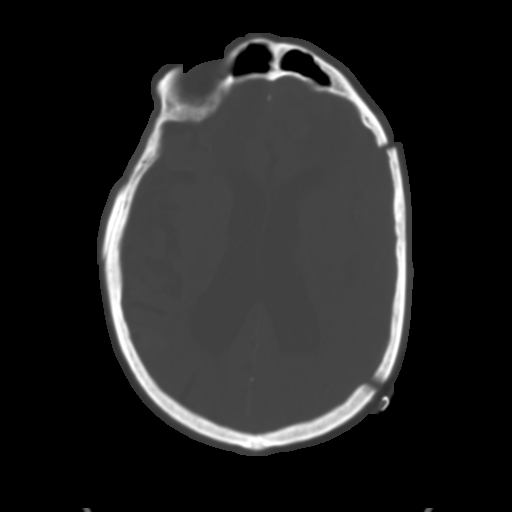
[im 31/41  brain]
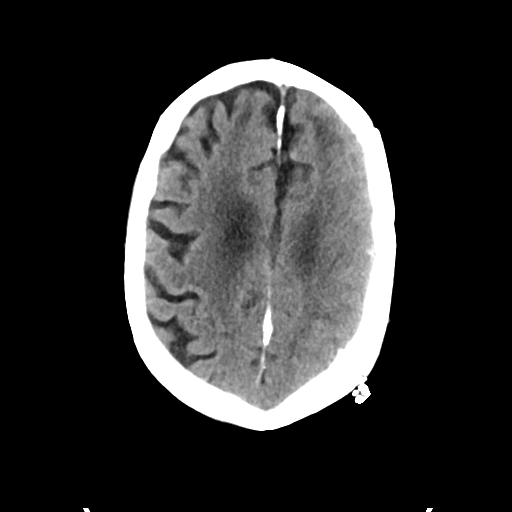
[im 36/41  brain]
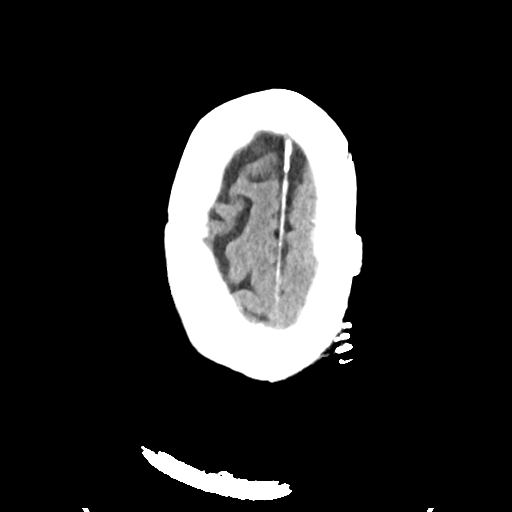

[Series 3: head bone · axial · 0.47mm/px · z∈[-215,-175]mm · 3 of 102 slices shown]
[im 11/102  bone]
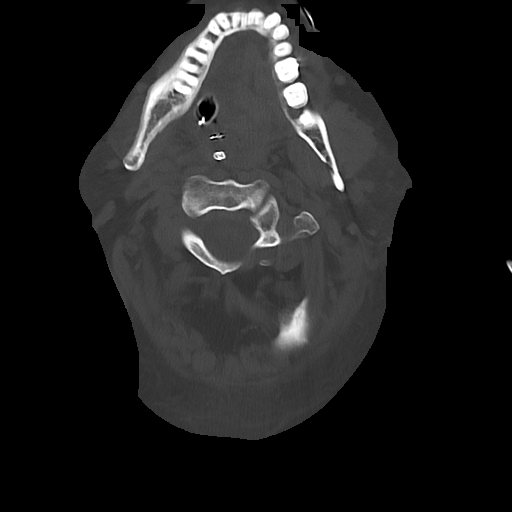
[im 21/102  bone]
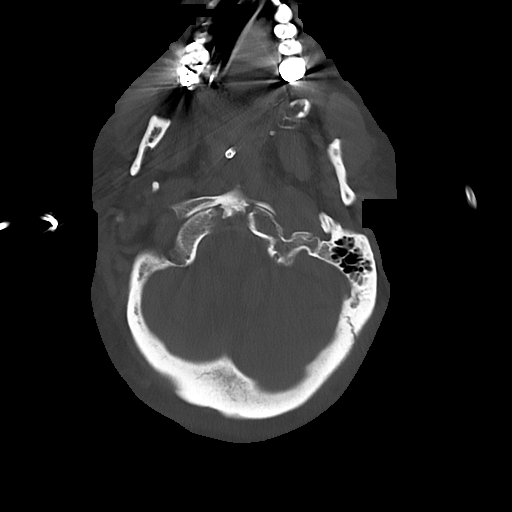
[im 31/102  bone]
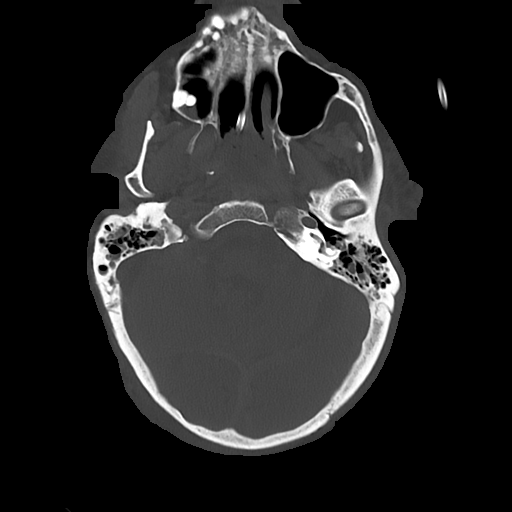

[Series 4: cor soft · coronal · 0.41mm/px · 3 of 85 slices shown]
[im 29/85  brain]
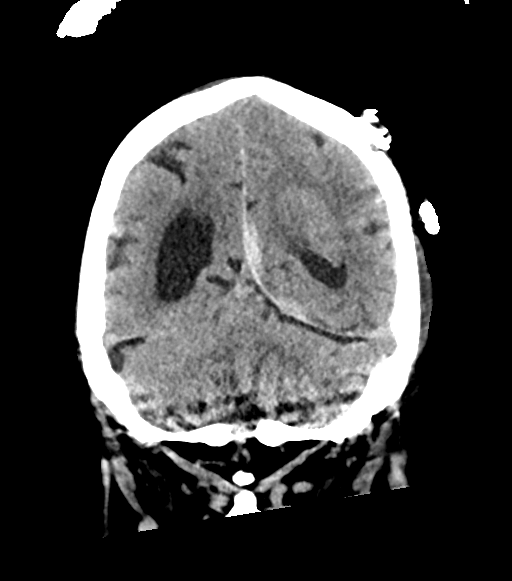
[im 37/85  brain]
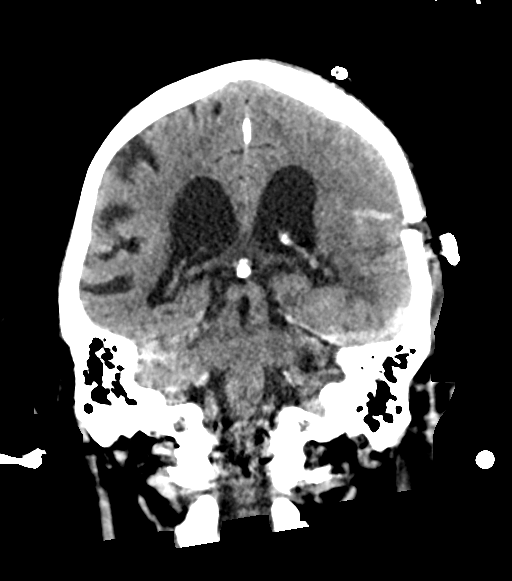
[im 45/85  brain]
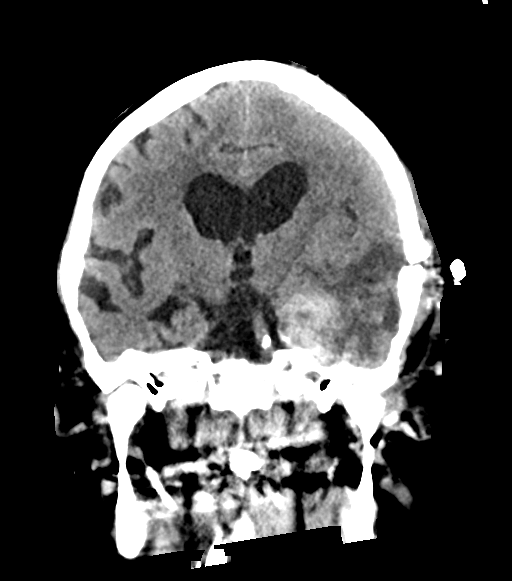

[Series 5: sag soft · sagittal · 0.45mm/px · 3 of 69 slices shown]
[im 28/69  brain]
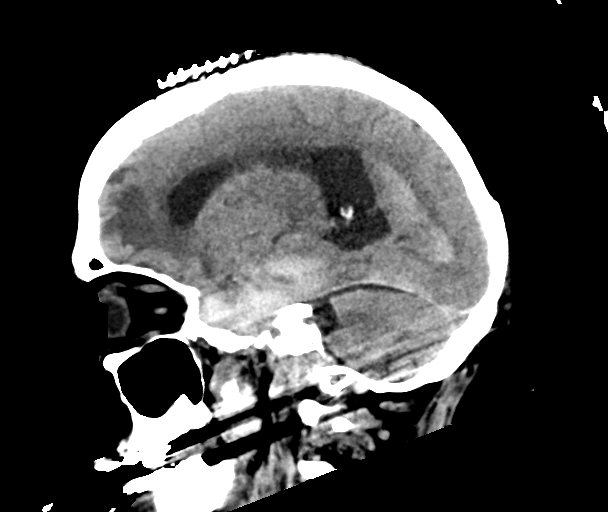
[im 35/69  brain]
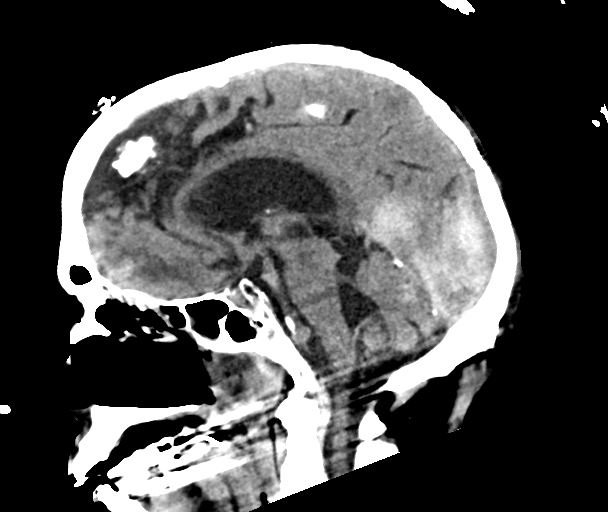
[im 42/69  brain]
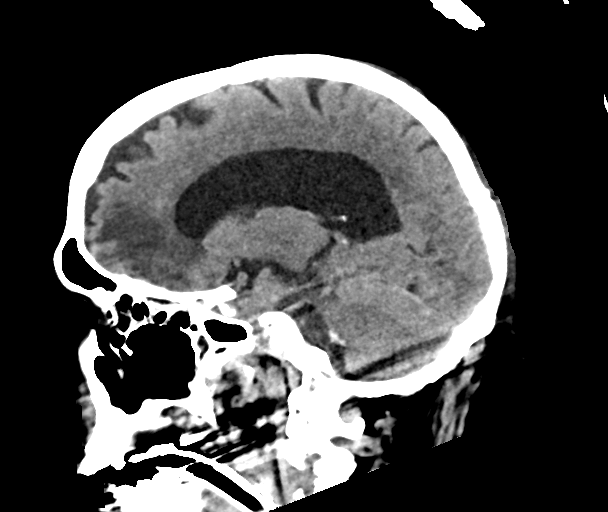

[16 of 47 positions shown; findings below may reference images not displayed]

FINDINGS: Brain: Edema in the anterior frontal lobes and left much greater
than right temporal lobes is similar in extent to the prior MRI and
consistent with traumatic, variably hemorrhagic contusions.
Hemorrhage in the inferior and medial aspects of the left middle
cranial fossa is similar to the prior MRI and may reflect a
combination of subdural and intra-axial blood. Additional small
volume subdural hemorrhage is again noted along the left greater
than right tentorium and falx. Layering hemorrhage in the occipital
horns of the lateral ventricles is unchanged. There is resolving,
small volume residual subarachnoid hemorrhage bilaterally. Mild
ventriculomegaly is unchanged from the prior MRI. No definite
superimposed acute ischemic infarct is identified. There is no
midline shift.

Vascular: Calcified atherosclerosis at the skull base.

Skull: Left-sided craniotomy with skin staples in place and scalp
soft tissue swelling.

Sinuses/Orbits: Mild mucosal thickening in the paranasal sinuses.
Small bilateral mastoid effusions. Bilateral cataract extraction.

Other: None.
IMPRESSION: 1. Unchanged mild ventriculomegaly.
2. Unchanged edema associated with bilateral frontal and temporal
lobe hemorrhagic contusions.
3. Unchanged intraventricular and subdural hemorrhage. Resolving
subarachnoid hemorrhage.

## 2021-11-27 IMAGING — DX DG ABDOMEN 1V
1 series · 1 of 1 positions shown · non-contrast
Comparison: Earlier same day.

CLINICAL DATA: Feeding tube placement

EXAM:
ABDOMEN - 1 VIEW

[abdomen supine]
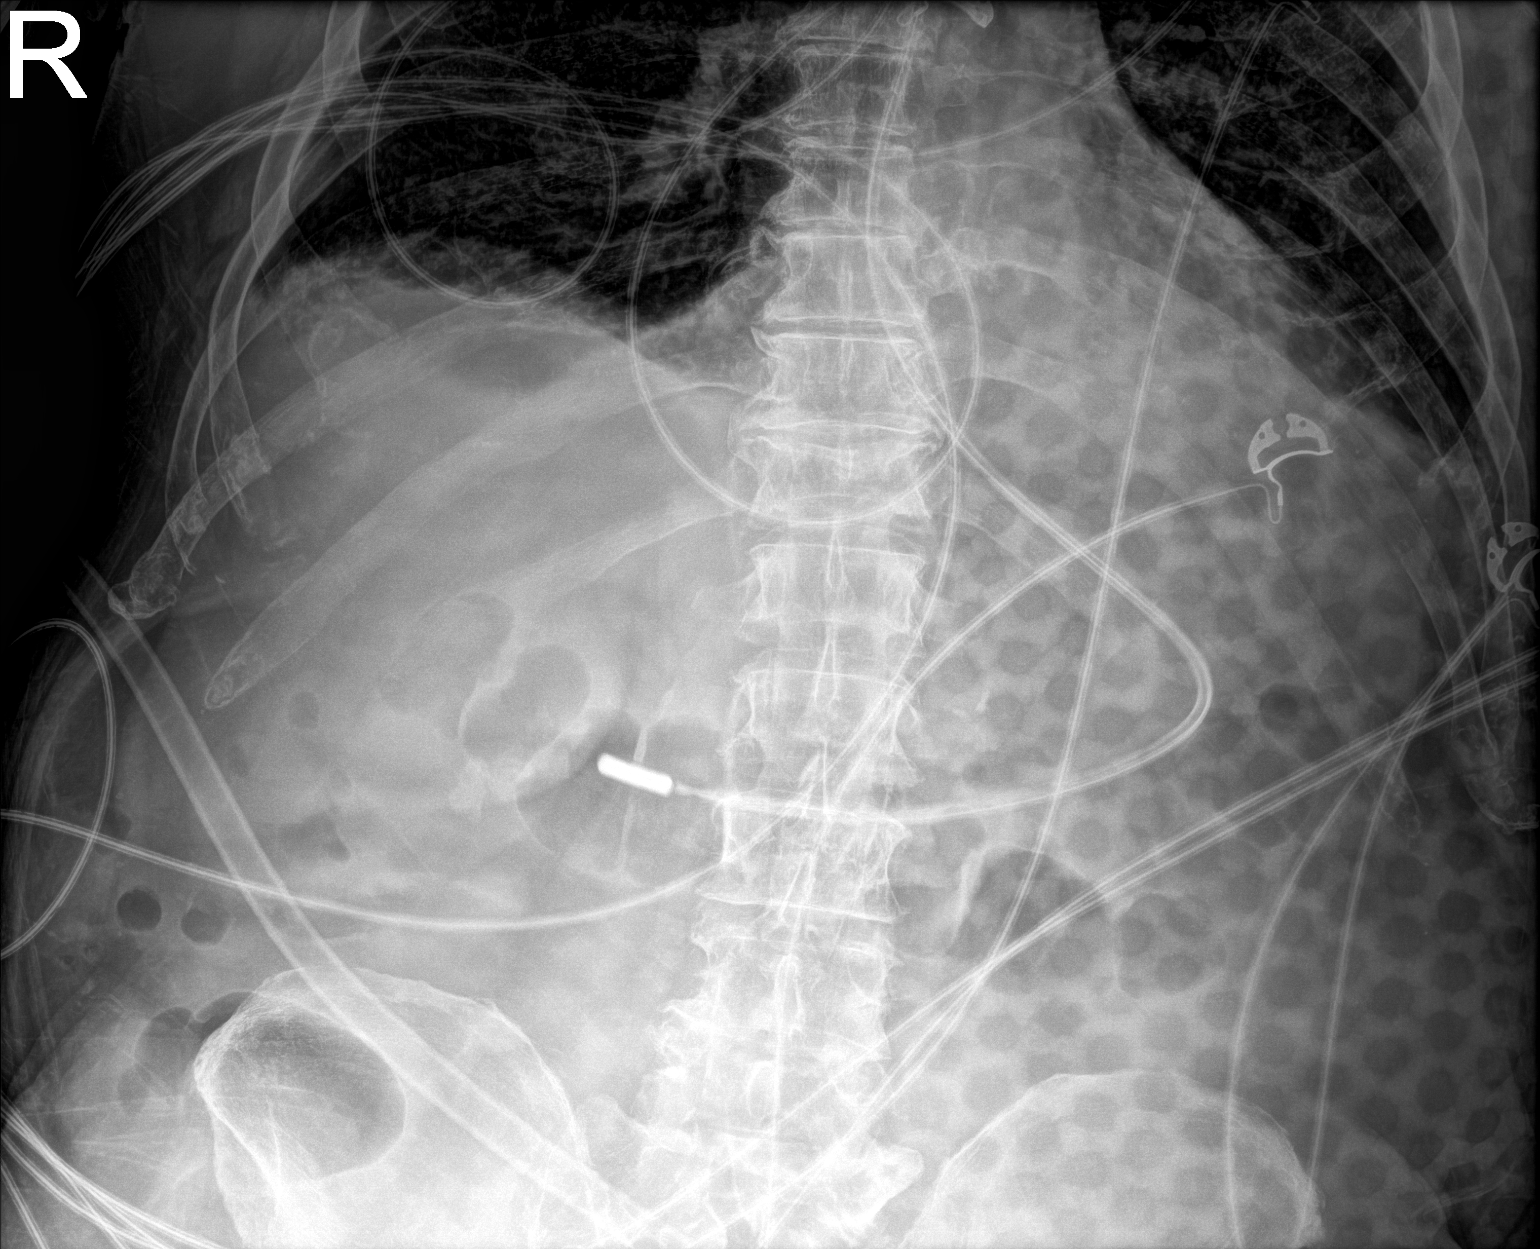

[1 of 1 positions shown; findings below may reference images not displayed]

FINDINGS: Feeding tube is been repositioned in the interval. The tip is now in
the distal stomach near the pylorus directed towards the duodenum.
Gas is visible in the transverse colon.
IMPRESSION: Feeding tube tip is at the pylorus, directed towards the duodenum.

## 2021-11-27 IMAGING — DX DG ABDOMEN 1V
1 series · 1 of 1 positions shown · non-contrast
Comparison: Previous studies including the examination is done
earlier today

CLINICAL DATA: Placement of feeding tube

EXAM:
ABDOMEN - 1 VIEW

[abdomen supine]
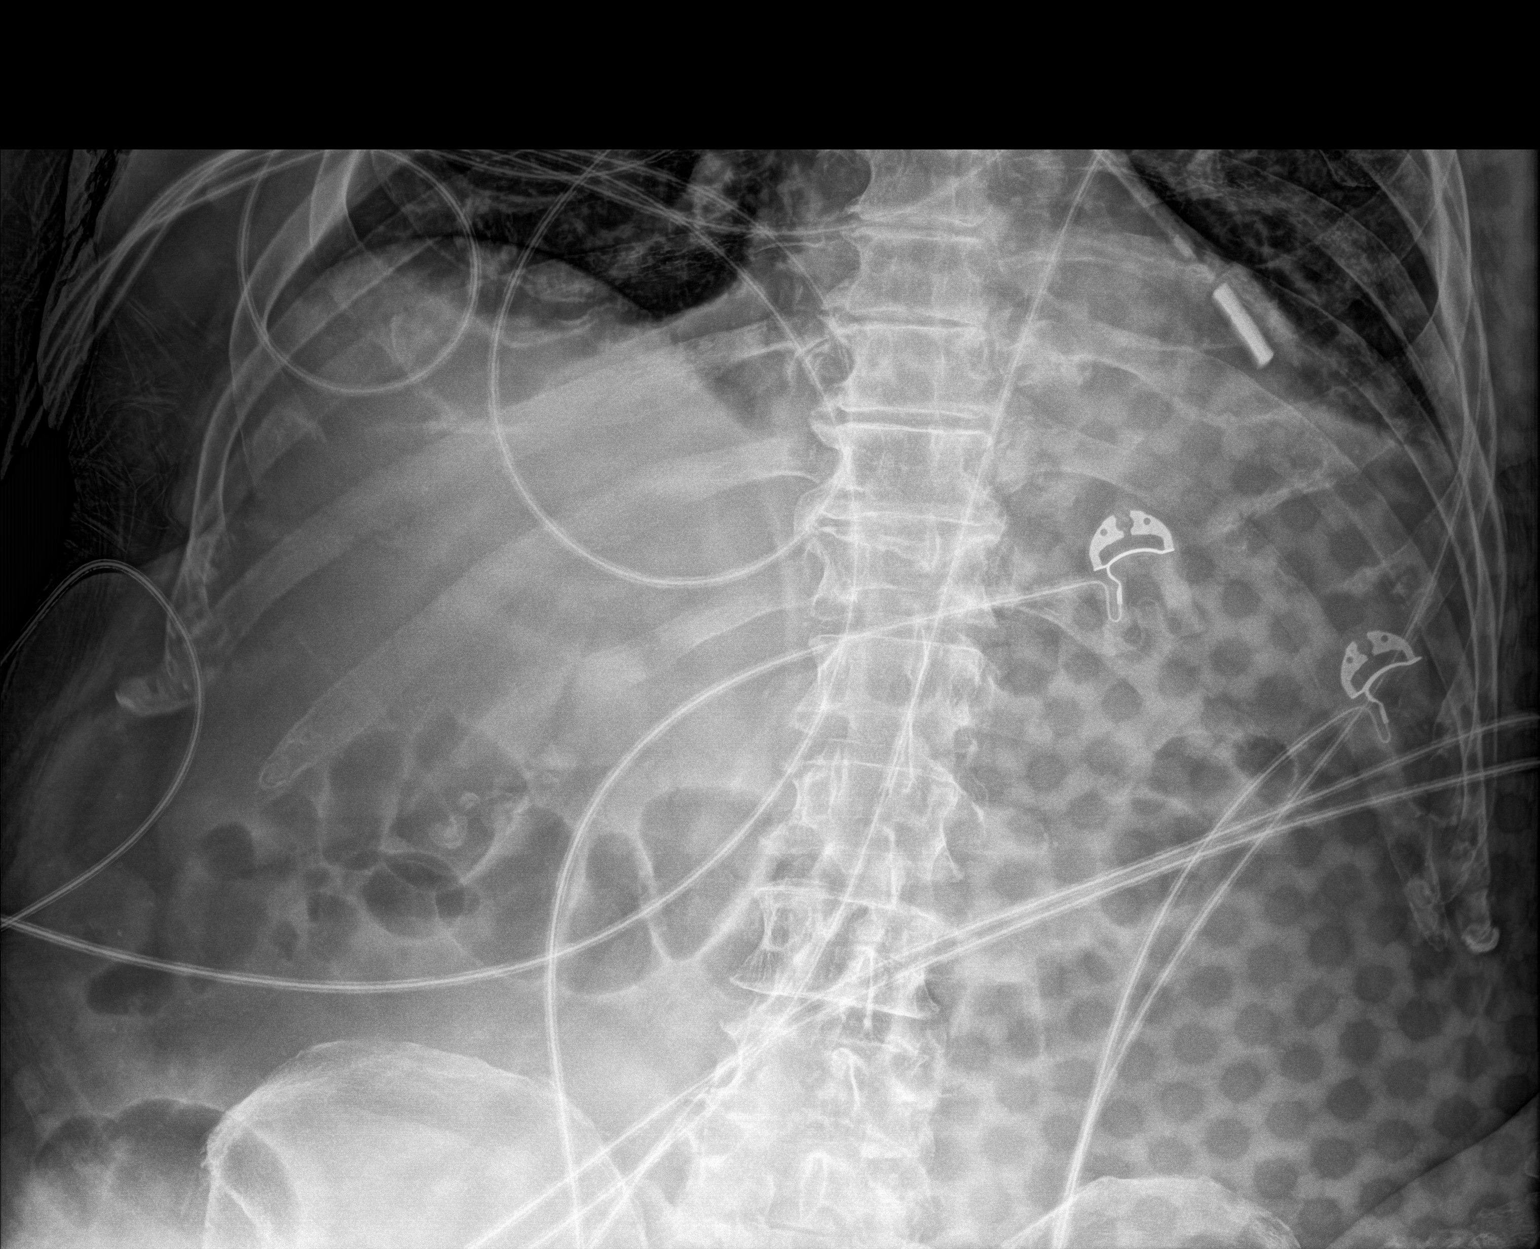

[1 of 1 positions shown; findings below may reference images not displayed]

FINDINGS: Tip of feeding tube is seen superimposed over the left lower lung
fields following the course of left lower lobe bronchus. There are
calcific densities in the right upper abdomen, possibly gallbladder
stones. There are linear densities in the left lower lung fields
suggesting atelectasis.
IMPRESSION: Tip of feeding tube is in the bronchial system possibly in the left
lower lobe bronchus.

This imaging finding was relayed to patient's nurse RONLOR by
telephone call. He was already aware of it and the feeding tube has
been removed and reinserted. In the image of abdomen done at [DATE]
p.m. on [DATE] tip of feeding tube is seen in the distal antrum
of the stomach.

## 2021-11-27 MED ORDER — INSULIN ASPART 100 UNIT/ML IJ SOLN
2.0000 [IU] | INTRAMUSCULAR | Status: DC
  Administered 2021-11-27 (×2): 4 [IU] via SUBCUTANEOUS
  Administered 2021-11-28 (×2): 2 [IU] via SUBCUTANEOUS
  Administered 2021-11-28: 4 [IU] via SUBCUTANEOUS
  Filled 2021-11-27 (×5): qty 1

## 2021-11-27 MED ORDER — DEXTROSE 50 % IV SOLN
25.0000 g | INTRAVENOUS | Status: AC
  Administered 2021-11-27: 25 g via INTRAVENOUS

## 2021-11-27 MED ORDER — DEXTROSE 10 % IV SOLN: INTRAVENOUS | Status: DC

## 2021-11-27 MED ORDER — INSULIN DETEMIR 100 UNIT/ML ~~LOC~~ SOLN
34.0000 [IU] | Freq: Two times a day (BID) | SUBCUTANEOUS | Status: DC
Start: 2021-11-27 — End: 2021-11-28
  Administered 2021-11-27 – 2021-11-28 (×2): 34 [IU] via SUBCUTANEOUS
  Filled 2021-11-27 (×3): qty 0.34

## 2021-11-27 MED ORDER — DEXTROSE 50 % IV SOLN
INTRAVENOUS | Status: AC
  Filled 2021-11-27: qty 50

## 2021-11-27 MED ORDER — POTASSIUM CHLORIDE 20 MEQ PO PACK
40.0000 meq | PACK | Freq: Once | ORAL | Status: DC
  Filled 2021-11-27: qty 2

## 2021-11-27 MED ORDER — INSULIN ASPART 100 UNIT/ML IJ SOLN
0.0000 [IU] | INTRAMUSCULAR | Status: DC
  Administered 2021-11-27: 5 [IU] via SUBCUTANEOUS
  Filled 2021-11-27: qty 1

## 2021-11-27 MED ORDER — INSULIN ASPART 100 UNIT/ML IJ SOLN
11.0000 [IU] | INTRAMUSCULAR | Status: DC
  Administered 2021-11-27 – 2021-11-28 (×5): 11 [IU] via SUBCUTANEOUS
  Filled 2021-11-27 (×5): qty 1

## 2021-11-27 MED ORDER — POTASSIUM CHLORIDE 20 MEQ PO PACK
40.0000 meq | PACK | Freq: Once | ORAL | Status: AC
  Administered 2021-11-27: 40 meq
  Filled 2021-11-27: qty 2

## 2021-11-27 NOTE — Progress Notes (Signed)
Office manager    ? ?Referral received from [facility].  She states family is interested in learning about hospice services.  Patient is scheduled for extubation this Thursday.  Team will make contact with the patient's family tomorrow to discuss hospice services and plan. ? ?Referral sent to Referral Intake Specialist ? ?Dimas Aguas, RN ?[facility] ?[phone] ?

## 2021-11-27 NOTE — Progress Notes (Signed)
Neurology Progress Note ? ?Patient ID: Michael Cohen is a 81 y.o. with PMHx of  has a past medical history of Diabetes mellitus without complication ([place]), Hypercholesteremia, and Hypertension. And daily drinking, admitted with traumatic SAH, SDH, IPH, IVH, s/p hematoma evacuation 5/4 now with developing hydrocephalus, neurology consulted for focal seizure activity on 5/10 (right facial twitching with left gaze started 5/9 when weaning propofol)  ? ?Interval events/ Subjective: ?- Glucose 34 overnight ?- Remains on cooling blanket for temperature control  ?- Aspiration event overnight, new leukocytosis to 25  ?- Per nurse at bedside, no twitching is signed out to her overnight  ? ?Exam: ?Vitals:  ? 11/27/21 0500 11/27/21 0600  ?BP: 111/69 100/64  ?Pulse: 88 88  ?Resp: (!) 28 (!) 22  ?Temp: (!) 96.8 ?F (36 ?C) (!) 96.8 ?F (36 ?C)  ?SpO2: 98% 95%  ? ?Gen: In bed, comfortable  ?Resp: non-labored breathing, no grossly audible wheezing, comfortable on the vent,  ?Cardiac: Perfusing extremities well ?Ext: RUE edema  ?Abd: soft, nt ? ?Neuro:  ?Mental Status: ?Opens eyes to voice, does not orient to examiner ?Spontaneously moving LUE ?Cranial Nerves: ?II: No blink to threat on the right, but blinks on the left. Pupils are equal, round, and reactive to light.  ?III,IV, VI/VIII: dysconjugate gaze at rest, midline conjugate with noxious stimulation, VOR intact but does not move eyes to voice ?V/VII: Eyelash brush response weaker on the right than the left  ?VIII: Intact to voice (eye opening, LUE movement) ?X/XI: Intact cough, no gag  ?XII: Unable to assess tongue protrusion secondary to patient's mental status  ?Motor/Sensory: More brisk on the right than the left, more brisk in uppers than lowers but withdraws throughout. Today, with just light noxious stim (tapping each extremity) he moves each extremity slightly. Not true command following but does appear more brisk than days prior ?Cerebellar: ?Unable to assess  secondary to patient's mental status  ?  ?Pertinent Data: ?Ammonia 15 ?LFTs stable to mildly worse AST 60 -> 41 -> 48 -> 57, ALT 92 -> 70 -> 66 -> 70 ?BUN 25 Cr 0.47 ?Leukocytosis increasing to 25 ?Hemogloblin stable (range 9 - 12 recently), ?VPA level 26 (post 2000 mg load, likely not yet steady state); repeat 27 on 5/12 PM ? ?5/10 EEG personally reviewed, agree with Dr.  Hortense Ramal read, slow with left temporal breach artifact and no ongoing seizure activity ? ?Impression: Focal motor status with her right face twitching and left gaze deviation, now resolved.  Mental status appears to be gradually improving, but still poor. Prognosis guarded given comorbidities and complications to date.  ? ?Recommendations: ?# Focal seizures with right facial twitching ?# Traumatic SAH, SDH, IPH, IVH with mild developing hydrocephalus ?- Keppra continue 1500 mg BID, will adjust if renal function decreases ?-Continue Depakote, trend LFTs, continue 250 mg q6hr, may increase dose if further seizures occur ?-EKG, if no evidence of heart block will consider Vimpat as next agent if further seizure activity as propofol weaned ?-Repeat head CT today to help guide goals of care conversations and monitor hydrocephalus  ? ?# B12 deficiency ? - 1000 mcg IM daily x 7 days then 1000 mcg PO daily ? ?# paroxysmal Atrial fibrillation on monitor ?-Anticoagulation contraindicated in the setting of his acute subdural hemorrhage ?-reverted to normal sinus after amio bolus 5/10 ?-If review of tele confirms atrial fibrillation, long-term would recommend anticoagulation for stroke prevention when safe to do so from a neurosurgical perspective ? ?Appreciate neurosurgery and  ICU co-management of other medical issues including worsening leukocytosis, ICU team updated at bedside, no family at bedside today ? ?Lesleigh Noe MD-PhD ?[facility] ?[phone]  ? ?40 min of critical care today ?

## 2021-11-27 NOTE — Progress Notes (Signed)
? ?NAME:  Michael Cohen, MRN:  [id number], DOB:  October 23, 1940, LOS: 10 ?ADMISSION DATE:  11/30/2021, CONSULTATION DATE:  12/07/2021 ?REFERRING MD:  Dr. Sidney Ace, CHIEF COMPLAINT: Fall   ? ?History of Present Illness:  ?81 year old male presenting to [facility] ED from [facility] on 12/02/2021 via EMS.  Per the patient's wife, he had gone out to walk the dog that evening and had fallen in the entryway hitting his head on the corner of the baseboard.  She reported he was altered post fall with complaints of a headache. Patient is not on systemic blood thinners but does take a baby aspirin every other day for heart health.  Wife confirms that the patient drinks 2 scotches daily and sometimes wine with dinner, unclear the exact amount of scotch.  She denies any other recent complaints.  No reports of witnessed seizure-like activity and the patient did confirm that he had drinking alcohol that evening. ?ED course: ?Head CT showed large subarachnoid hemorrhage.  Follow-up CTA revealed no aneurysm.  EDP spoke with Dr. Cari Caraway with neurosurgery who initially recommended admitting to hospitalist service with repeat CT in 6 hours.  TRH admitted patient to stepdown unit.  However, patient still boarding in ED when he became agitated attempting to get out of bed and then vomited.  At this point EDP described patient as having gurgling respirations with SPO2 83% on room air.  Patient was emergently intubated requiring mechanical ventilatory support.  Stat repeat head CT revealed new subdural bleed with 7 mm midline shift.  Dr. Cari Caraway from neurosurgery made aware of change, discussed options bedside with wife and the decision was made to take the patient urgently to the OR and transferred directly to ICU. ? ?Medications given: Keppra 1000 mg, mannitol 50 g, IV contrast, fentanyl, 1 L NS, Tdap, banana bag, etomidate, succinylcholine, propofol drip started ? ?Initial Vitals: 97.3 F, 17, 62, 117/67 and SPO2 95% on room air ? ?EKG  Interpretation: Date: 11/15/2021, EKG Time: 23: 55, Rate: 63, Rhythm: First-degree heart block, QRS Axis: Normal,  ?Intervals: First-degree heart block, borderline prolonged QTc, ST/T Wave abnormalities: None, Narrative Interpretation: NSR with first-degree heart block ? ?Significant labs: (Labs/ Imaging personally reviewed) ?Chemistry: Na+:139, K+: 3.3, BUN/Cr.: 8/0.67, Serum CO2/ AG: 24/13 ?Hematology: WBC: 8.2, Hgb: 12.5,  ?Troponin: 6 >5, COVID-19 & Influenza A/B: pending ?ABG: pending ? ?Imaging: ?CXR 12/12/2021: Low lung volumes with probable areas of passive subsegmental atelectasis, as above ?CT head Wo contrast 11/19/2021: Diffuse subarachnoid hemorrhage as described above concentrated predominately in the left sylvian fissure. 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 CTA of the head is ?recommended for further evaluation. Chronic atrophic and ischemic changes are noted ?CT cervical spine 12/07/2021: Degenerative change without acute abnormality ?CT angio head 12/08/2021: No aneurysm, occlusion or high-grade stenosis of the intracranial arteries ?CT head wo contrast 11/30/2021: New subdural hematoma along the majority of the left cerebral convexity measuring up to 1 cm in thickness and primarily responsible for new 7 mm midline shift. Some thickening of subarachnoid hemorrhage although stable pattern. Trace subdural hematoma along the anterior right frontal convexity. Early visualization of cerebral contusion in the bilateral inferior frontal and left temporal lobes. ? ?PCCM consulted due to deterioration requiring emergent intubation and mechanical ventilatory support. ? 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 ? ?Micro data:  ?5/4: SARS-CoV-2 and influenza PCR>> negative ?5/4: MRSA PCR>> negative ?5/7: Tracheal aspirate>>CITROBACTER KOSERI (pansensitive) ?5/8: Tracheal  aspirate>>CITROBACTER KOSERI  ?5/9: MRSA PCR>>  negative ? ?Antimicrobials:  ?Unasyn 5/4>> 5/5 ?Azithromycin 5/9 x 1 dose ?Vancomycin 5/9 >>5/11 ?Zosyn 5/9>>5/11 ?Ceftriaxone 5/12>> ? ?Significant Hospital Events: ?Including procedures, antibiotic start and stop dates in addition to other pertinent events   ?5/4: Admit to ICU with worsening SAH after emergent intubation on mechanical ventilatory support with plans to go urgently to OR for neurosurgery ?5/5: s/p craniotomy for TRAUMATIC SAH/subdural hematoma, remains on vent ?5/5: remains on  vent, family updated ?5/6: JP drain removed per Neurosurgery  ?5/6: MRI Brain revealed no ischemic infarction identified. Comparing the MRI to  ?     the most recent CT of earlier today, no change is appreciated. Sequela of   ?     hemorrhagic contusion of both frontal lobes and in the anterior left temporal  ?     lobe. Widespread subarachnoid blood and dependent intraventricular blood with  ?     stable ventricular size. Stable subdural blood along the medial aspect of the  ?     middle cranial fossa on the left, along the falx posteriorly and along the  ?     tentorium. ?5/6: CT Head revealed multifocal intracranial hemorrhage following trauma and  ?      surgical evacuation of left side subdural has not significantly changed since    ?      11/26/2021. Conspicuous cerebral edema throughout the left temporal lobe and ?       in both inferior frontal gyri is favored to be posttraumatic, and is at least in part  ?       due to hemorrhagic cerebral contusions. Mild rightward midline shift of 4-5 mm  ?       has slightly increased, but there is no other progressive intracranial mass effect.  ?       A moderate volume of intraventricular hemorrhage has increased, but ventricle  ?       size and configuration remains stable. ?5/8: Overnight propofol gtt started due to vent asynchrony overnight.  Pt unable to follow commands.   ?5/8: EEG suggestive of cortical dysfunction in left frontotemporal region likely secondary to  underlying structural abnormality as well as moderate to severe diffuse encephalopathy, nonspecific to etiology.  No seizures or definite epileptiform discharges were seen throughout the recording. ?5/9: Propofol gtt stopped for WUA neurological exam remains unchanged unable to follow commands.  Pt with increased work of breathing when sedation stopped.  Will diurese with 40 mg iv lasix x1 dose currently 2L positive  ?5/10: Patient with recurrent right lip/mouth twitching concerning for seizures.  EEG pending.  MRI with enlarging ventricles but otherwise no major changes from 5/6 MRI. Neurosurgery does not recommend intervening on enlarged ventricles at this time.  Consult neurology.  PICC placed due to lack of IV access ?5/11: EEG without seizure activity.  Leukocytosis slowly improving, fever resolved.  Diurese with Lasix x1 dose.  ABX deescalated to Rocephin. ?5/12: Transition from Propofol to Versed due to Hypertriglyceridemia.  Lasix 40 mg x1. ?5/13: Lasix 40 mg iv x1 dose.  Tolerating SBT 12/5 ?5/14: aspiration event overnight ? ?Interim History / Subjective:  ?Aspirated on tube feeds overnight. New leukocytosis and mildly worsened hypoxia this AM. Had a small air leak on the vent, balloon reinflated and head repositioned with resolution. ? ?Per Neuro, his neurologic exam continues to improve. Eye opening not clearly purposeful. Not following commands but is moving his extremities, R>L. ? ?Objective   ?Blood  pressure 119/66, pulse 86, temperature 97.7 ?F (36.5 ?C), resp. rate 16, height '5\' 10"'$  (1.778 m), weight 91.5 kg, SpO2 97 %. ?   ?Vent Mode: PRVC ?FiO2 (%):  [30 %-60 %] 45 % ?Set Rate:  [15 bmp] 15 bmp ?Vt Set:  [580 mL] 580 mL ?PEEP:  [5 cmH20] 5 cmH20 ?Plateau Pressure:  [11 cmH20-22 cmH20] 22 cmH20  ? ?Intake/Output Summary (Last 24 hours) at 11/27/2021 1218 ?Last data filed at 11/27/2021 0606 ?Gross per 24 hour  ?Intake 205.4 ml  ?Output 1760 ml  ?Net -1554.6 ml  ? ?Filed Weights  ? 11/25/21 0600  11/26/21 0500 11/27/21 0500  ?Weight: 88.9 kg 91.8 kg 91.5 kg  ? ? ?Examination: ?General: Acute on chronically ill appearing male, NAD ?HEENT: Left head incision site staples intact no drainage present, no JVD  ?Neuro: Sedated, not f

## 2021-11-27 NOTE — Progress Notes (Signed)
Pt was transported to CT from CCU and back while on the vent. 

## 2021-11-27 NOTE — Progress Notes (Signed)
PHARMACY CONSULT NOTE  ? ?Pharmacy Consult for Electrolyte Monitoring and Replacement  ? ?Recent Labs: ?Potassium (mmol/L)  ?Date Value  ?11/27/2021 3.4 (L)  ? ?Magnesium (mg/dL)  ?Date Value  ?11/27/2021 2.8 (H)  ? ?Calcium (mg/dL)  ?Date Value  ?11/27/2021 9.0  ? ?Albumin (g/dL)  ?Date Value  ?11/27/2021 2.0 (L)  ? ?Phosphorus (mg/dL)  ?Date Value  ?11/27/2021 4.2  ? ?Sodium (mmol/L)  ?Date Value  ?11/27/2021 145  ? ?Assessment: ?81 year old male with PMH of HTN, T2DM, ETOH use, HLD admitted with subarachnoid hemorrhage. S/p craniotomy. ? ?Nutrition: Vital HP at 65>55 mL/hr (1.3L) + free water flushes 30>50 mL q4h (344m) ? ?Goal of Therapy:  ?Electrolytes within normal limits ? ?Plan:  ?Scr stable at baseline ?K 3.4   NP ordered KCL 40 meq packet per tube x 1 dose ?Follow-up electrolytes with AM labs tomorrow ? ?KChinita GreenlandPharmD ?Clinical Pharmacist ?11/27/2021 ? ? ? ?

## 2021-11-27 NOTE — TOC Progression Note (Signed)
Transition of Care (TOC) - Progression Note  ? ? ?Patient Details  ?Name: Michael Cohen ?MRN: [id number] ?Date of Birth: 1941/06/12 ? ?Transition of Care (TOC) CM/SW Contact  ?[place], LCSWA ?Phone Number: ?11/27/2021, 3:33 PM ? ?Clinical Narrative:    ? ?CSW informed by provider the patient's family is interested in hospice services. CSW informed Marcene Brawn from [facility] and she reported they will follow the patient.  ? ?No further TOC needs at this time.  ? ? ?Expected Discharge Plan:  (TBD) ?Barriers to Discharge: Continued Medical Work up ? ?Expected Discharge Plan and Services ?Expected Discharge Plan:  (TBD) ?  ?  ?  ?Living arrangements for the past 2 months: [place] ?                ?DME Arranged: N/A ?DME Agency: NA ?  ?  ?  ?HH Arranged: NA ?  ?  ?  ?  ? ? ?Social Determinants of Health (SDOH) Interventions ?  ? ?Readmission Risk Interventions ?   ? View : No data to display.  ?  ?  ?  ? ? ?

## 2021-11-28 ENCOUNTER — Encounter: Payer: Self-pay | Admitting: Internal Medicine

## 2021-11-28 ENCOUNTER — Ambulatory Visit: Payer: Medicare Other

## 2021-11-28 ENCOUNTER — Other Ambulatory Visit: Payer: Self-pay

## 2021-11-28 DIAGNOSIS — Z7189 Other specified counseling: Secondary | ICD-10-CM | POA: Diagnosis not present

## 2021-11-28 DIAGNOSIS — R4182 Altered mental status, unspecified: Secondary | ICD-10-CM | POA: Diagnosis not present

## 2021-11-28 DIAGNOSIS — Z515 Encounter for palliative care: Secondary | ICD-10-CM

## 2021-11-28 DIAGNOSIS — I609 Nontraumatic subarachnoid hemorrhage, unspecified: Secondary | ICD-10-CM | POA: Diagnosis not present

## 2021-11-28 LAB — GLUCOSE, CAPILLARY
Glucose-Capillary: 143 mg/dL — ABNORMAL HIGH (ref 70–99)
Glucose-Capillary: 166 mg/dL — ABNORMAL HIGH (ref 70–99)
Glucose-Capillary: 121 mg/dL — ABNORMAL HIGH (ref 70–99)

## 2021-11-28 LAB — CBC
HCT: 28.5 % — ABNORMAL LOW (ref 39.0–52.0)
Hemoglobin: 8.4 g/dL — ABNORMAL LOW (ref 13.0–17.0)
MCH: 24.6 pg — ABNORMAL LOW (ref 26.0–34.0)
MCHC: 29.5 g/dL — ABNORMAL LOW (ref 30.0–36.0)
MCV: 83.6 fL (ref 80.0–100.0)
Platelets: 322 10*3/uL (ref 150–400)
RBC: 3.41 MIL/uL — ABNORMAL LOW (ref 4.22–5.81)
RDW: 17.1 % — ABNORMAL HIGH (ref 11.5–15.5)
WBC: 19.8 10*3/uL — ABNORMAL HIGH (ref 4.0–10.5)
nRBC: 0.1 % (ref 0.0–0.2)

## 2021-11-28 LAB — BASIC METABOLIC PANEL
Anion gap: 4 — ABNORMAL LOW (ref 5–15)
BUN: 28 mg/dL — ABNORMAL HIGH (ref 8–23)
CO2: 35 mmol/L — ABNORMAL HIGH (ref 22–32)
Calcium: 8.7 mg/dL — ABNORMAL LOW (ref 8.9–10.3)
Chloride: 109 mmol/L (ref 98–111)
Creatinine, Ser: 0.53 mg/dL — ABNORMAL LOW (ref 0.61–1.24)
GFR, Estimated: >60 mL/min (ref >60)
Glucose, Bld: 156 mg/dL — ABNORMAL HIGH (ref 70–99)
Potassium: 3.8 mmol/L (ref 3.5–5.1)
Sodium: 148 mmol/L — ABNORMAL HIGH (ref 135–145)

## 2021-11-28 LAB — MAGNESIUM: Magnesium: 2.7 mg/dL — ABNORMAL HIGH (ref 1.7–2.4)

## 2021-11-28 LAB — PHOSPHORUS: Phosphorus: 3.8 mg/dL (ref 2.5–4.6)

## 2021-11-28 LAB — METHYLMALONIC ACID, SERUM: Methylmalonic Acid, Quantitative: 451 nmol/L — ABNORMAL HIGH (ref 0–378)

## 2021-11-28 MED ORDER — LORAZEPAM 2 MG/ML IJ SOLN
1.0000 mg | INTRAMUSCULAR | Status: DC | PRN
  Administered 2021-11-28: 1 mg via INTRAVENOUS
  Filled 2021-11-28: qty 1

## 2021-11-28 MED ORDER — FREE WATER
200.0000 mL | Status: DC
Start: 2021-11-28 — End: 2021-11-28
  Administered 2021-11-28: 200 mL

## 2021-11-28 MED ORDER — ACETAMINOPHEN 325 MG PO TABS: 650.0000 mg | ORAL_TABLET | Freq: Four times a day (QID) | ORAL | Status: DC | PRN

## 2021-11-28 MED ORDER — MORPHINE 100MG IN NS 100ML (1MG/ML) PREMIX INFUSION
1.0000 mg/h | INTRAVENOUS | Status: DC
  Administered 2021-11-28: 2 mg/h via INTRAVENOUS
  Filled 2021-11-28: qty 100

## 2021-11-28 MED ORDER — LORAZEPAM 1 MG PO TABS: 1.0000 mg | ORAL_TABLET | ORAL | Status: DC | PRN

## 2021-11-28 MED ORDER — HALOPERIDOL 0.5 MG PO TABS
0.5000 mg | ORAL_TABLET | ORAL | Status: DC | PRN
  Filled 2021-11-28: qty 1

## 2021-11-28 MED ORDER — HALOPERIDOL LACTATE 5 MG/ML IJ SOLN: 0.5000 mg | INTRAMUSCULAR | Status: DC | PRN

## 2021-11-28 MED ORDER — ACETAMINOPHEN 650 MG RE SUPP: 650.0000 mg | Freq: Four times a day (QID) | RECTAL | Status: DC | PRN

## 2021-11-28 MED ORDER — LORAZEPAM 2 MG/ML PO CONC: 1.0000 mg | ORAL | Status: DC | PRN

## 2021-11-28 MED ORDER — GLYCOPYRROLATE 0.2 MG/ML IJ SOLN
0.2000 mg | INTRAMUSCULAR | Status: DC | PRN
  Administered 2021-11-28: 0.2 mg via INTRAVENOUS
  Filled 2021-11-28: qty 1

## 2021-11-28 MED ORDER — HALOPERIDOL LACTATE 2 MG/ML PO CONC
0.5000 mg | ORAL | Status: DC | PRN
  Filled 2021-11-28: qty 5

## 2021-11-28 MED ORDER — OXYCODONE HCL 5 MG PO TABS: 5.0000 mg | ORAL_TABLET | Freq: Four times a day (QID) | ORAL | Status: DC

## 2021-11-28 MED ORDER — MORPHINE BOLUS VIA INFUSION
2.0000 mg | INTRAVENOUS | Status: DC | PRN
  Administered 2021-11-28 (×3): 2 mg via INTRAVENOUS

## 2021-11-30 ENCOUNTER — Ambulatory Visit: Payer: Medicare Other

## 2021-11-30 LAB — CULTURE, RESPIRATORY W GRAM STAIN: Culture: NORMAL

## 2021-12-05 ENCOUNTER — Ambulatory Visit: Payer: Medicare Other

## 2021-12-07 ENCOUNTER — Ambulatory Visit: Payer: Medicare Other

## 2021-12-13 ENCOUNTER — Ambulatory Visit: Payer: Medicare Other

## 2021-12-14 ENCOUNTER — Ambulatory Visit: Payer: Medicare Other | Admitting: Physical Therapy

## 2021-12-15 ENCOUNTER — Ambulatory Visit: Payer: Medicare Other

## 2021-12-15 NOTE — Procedures (Signed)
Extubation Procedure Note ? ?Patient Details:   ?Name: Samuel Mcpeek ?DOB: 1940/12/05 ?MRN: [id number] ?  ?Airway Documentation:  ?Airway 7.5 mm (Active)  ?Secured at (cm) 27 cm 12/17/2021 1216  ?Measured From Lips 2021/12/17 1216  ?[place] 12/17/2021 1216  ?Secured By [employer] Dec 17, 2021 1216  ?Tube Holder Repositioned Yes 2021/12/17 1216  ?Prone position No 2021-12-17 1216  ?Cuff Pressure (cm H2O) [place] 18-26 [facility] 17-Dec-2021 0313  ?Site Condition Dry 2021-12-17 1216  ? ?Vent end date: (not recorded) Vent end time: (not recorded)  ? ?Evaluation ? O2 sats: stable throughout and currently acceptable ?Complications: No apparent complications ?Patient did tolerate procedure well. ?Bilateral Breath Sounds: Diminished ?  ?No ? ?Patient was extubated to a 2L [place] per comfort care extubation. RN and family were at the bedside during extubation. ? ?Tiburcio Bash ?December 17, 2021, 2:00 PM ? ?

## 2021-12-15 NOTE — Death Summary Note (Signed)
?DEATH SUMMARY  ? ?Patient Details  ?Name: Michael Cohen ?MRN: [id number] ?DOB: 1941-06-15 ? ?Admission/Discharge Information  ? ?Admit Date:  Dec 08, 2021  ?Date of Death:  Dec 20, 2021 ?  ?Time of Death:  16:03  ?Length of Stay: 11  ?Referring Physician: Kirk Ruths, MD  ? ?Reason(s) for Hospitalization  ?TRAUMATIC BRAIN INJURY, SAH with left sided SDH. ? ?Diagnoses  ?Preliminary cause of death: TRAUMATIC BRAIN INJURY, SAH with left sided SDH. ?Secondary Diagnoses (including complications and co-morbidities):  ?Principal Problem: ?  SAH (subarachnoid hemorrhage) ([place]) ?Active Problems: ?  Fall at home, initial encounter ?  Essential hypertension ?  Type 2 diabetes mellitus without complications ([place]) ?  GERD without esophagitis ?ALCOHOL ABUSE ? ?Brief Hospital Course (including significant findings, care, treatment, and services provided and events leading to death)  ?81 year old male presenting to [facility] ED from [place] on 12/08/2021 via EMS.  Per the patient's wife, he had gone out to walk the dog that evening and had fallen in the entryway hitting his head on the corner of the baseboard.  She reported he was altered post fall with complaints of a headache. Patient is not on systemic blood thinners but does take a baby aspirin every other day for heart health.  Wife confirms that the patient drinks 2 scotches daily and sometimes wine with dinner, unclear the exact amount of scotch.  She denies any other recent complaints.  No reports of witnessed seizure-like activity and the patient did confirm that he had drinking alcohol that evening. ?ED course: ?Head CT showed large subarachnoid hemorrhage.  Follow-up CTA revealed no aneurysm.  EDP spoke with Dr. Cari Caraway with neurosurgery who initially recommended admitting to hospitalist service with repeat CT in 6 hours.  TRH admitted patient to stepdown unit.  However, patient still boarding in ED when he became agitated attempting to get out of bed and then  vomited.  At this point EDP described patient as having gurgling respirations with SPO2 83% on room air.  Patient was emergently intubated requiring mechanical ventilatory support.  Stat repeat head CT revealed new subdural bleed with 7 mm midline shift.  Dr. Cari Caraway from neurosurgery made aware of change, discussed options bedside with wife and the decision was made to take the patient urgently to the OR and transferred directly to ICU. ?  ?Medications given: Keppra 1000 mg, mannitol 50 g, IV contrast, fentanyl, 1 L NS, Tdap, banana bag, etomidate, succinylcholine, propofol drip started ?  ?Initial Vitals: 97.3 F, 17, 62, 117/67 and SPO2 95% on room air ?  ?EKG Interpretation: Date: 12/08/2021, EKG Time: 23: 55, Rate: 63, Rhythm: First-degree heart block, QRS Axis: Normal,  ?Intervals: First-degree heart block, borderline prolonged QTc, ST/T Wave abnormalities: None, Narrative Interpretation: NSR with first-degree heart block ?  ?Significant labs: (Labs/ Imaging personally reviewed) ?Chemistry: Na+:139, K+: 3.3, BUN/Cr.: 8/0.67, Serum CO2/ AG: 24/13 ?Hematology: WBC: 8.2, Hgb: 12.5,  ?Troponin: 6 >5, COVID-19 & Influenza A/B: pending ?ABG: pending ?  ?Imaging: ?CXR 11/19/2021: Low lung volumes with probable areas of passive subsegmental atelectasis, as above ?CT head Wo contrast 12/12/2021: Diffuse subarachnoid hemorrhage as described above concentrated predominately in the left sylvian fissure. 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 CTA of the head is ?recommended for further evaluation. Chronic atrophic and ischemic changes are noted ?CT cervical spine 12/07/2021: Degenerative change without acute abnormality ?CT angio head 11/14/2021: No aneurysm, occlusion or high-grade stenosis of the intracranial arteries ?CT head  wo contrast 11/15/2021: New subdural hematoma along the majority of the left cerebral convexity measuring up to 1 cm in thickness and  primarily responsible for new 7 mm midline shift. Some thickening of subarachnoid hemorrhage although stable pattern. Trace subdural hematoma along the anterior right frontal convexity. Early visualization of cerebral contusion in the bilateral inferior frontal and left temporal lobes. ?  ?PCCM consulted due to deterioration requiring emergent intubation and mechanical ventilatory support. ?  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  ?  ?Micro data:  ?5/4: SARS-CoV-2 and influenza PCR>> negative ?5/4: MRSA PCR>> negative ?5/7: Tracheal aspirate>>CITROBACTER KOSERI (pansensitive) ?5/8: Tracheal aspirate>>CITROBACTER KOSERI  ?5/9: MRSA PCR>> negative ?5/14: Tracheal aspirate>> gram + cocci, gram - rods, gram + rods ?  ?Antimicrobials:  ?Unasyn 5/4>> 5/5 ?Azithromycin 5/9 x 1 dose ?Vancomycin 5/9 >>5/11 ?Zosyn 5/9>>5/11 ?Ceftriaxone 5/12>> ?  ?Significant Hospital Events: ?Including procedures, antibiotic start and stop dates in addition to other pertinent events   ?5/4: Admit to ICU with worsening SAH after emergent intubation on mechanical ventilatory support with plans to go urgently to OR for neurosurgery ?5/5: s/p craniotomy for TRAUMATIC SAH/subdural hematoma, remains on vent ?5/5: remains on  vent, family updated ?5/6: JP drain removed per Neurosurgery  ?5/6: MRI Brain revealed no ischemic infarction identified. Comparing the MRI to  ?     the most recent CT of earlier today, no change is appreciated. Sequela of   ?     hemorrhagic contusion of both frontal lobes and in the anterior left temporal  ?     lobe. Widespread subarachnoid blood and dependent intraventricular blood with  ?     stable ventricular size. Stable subdural blood along the medial aspect of the  ?     middle cranial fossa on the left, along the falx posteriorly and along the  ?     tentorium. ?5/6: CT Head revealed multifocal intracranial hemorrhage following trauma and  ?      surgical evacuation of left side  subdural has not significantly changed since    ?      11/24/2021. Conspicuous cerebral edema throughout the left temporal lobe and ?       in both inferior frontal gyri is favored to be posttraumatic, and is at least in part  ?       due to hemorrhagic cerebral contusions. Mild rightward midline shift of 4-5 mm  ?       has slightly increased, but there is no other progressive intracranial mass effect.  ?       A moderate volume of intraventricular hemorrhage has increased, but ventricle  ?       size and configuration remains stable. ?5/8: Overnight propofol gtt started due to vent asynchrony overnight.  Pt unable to follow commands.   ?5/8: EEG suggestive of cortical dysfunction in left frontotemporal region likely secondary to underlying structural abnormality as well as moderate to severe diffuse encephalopathy, nonspecific to etiology.  No seizures or definite epileptiform discharges were seen throughout the recording. ?5/9: Propofol gtt stopped for [facility] neurological exam remains unchanged unable to follow commands.  Pt with increased work of breathing when sedation stopped.  Will diurese with 40 mg iv lasix x1 dose currently 2L positive  ?5/10: Patient with recurrent right lip/mouth twitching concerning for seizures.  EEG pending.  MRI with enlarging ventricles but otherwise no major changes from 5/6 MRI. Neurosurgery does not recommend intervening on enlarged ventricles at this time.  Consult neurology.  PICC placed due to lack of IV access ?5/11: EEG without seizure activity.  Leukocytosis slowly improving, fever resolved.  Diurese with Lasix x1 dose.  ABX deescalated to Rocephin. ?5/12: Transition from Propofol to Versed due to Hypertriglyceridemia.  Lasix 40 mg x1. ?5/13: Lasix 40 mg iv x1 dose.  Tolerating SBT 12/5 ?5/14: aspiration event overnight.  Repeat CTH unchanged ?5/15: On no sedation, Neuro remains poor, unchanged.  Family considering 1 way extubation/transition to comfort measures in next few  days ?  ?Interim History / Subjective:  ?-Repeat CTH yesterday with no acute changes ?-No significant events noted overnight ?-Afebrile, hemodynamically stable, no vasopressors ?-Preliminary Tracheal aspirate from 5/1

## 2021-12-15 NOTE — Progress Notes (Addendum)
Morphine gtt started as patient transitioned to comfort care. Patient extubated to 2 L [place] earlier this afternoon at approximately 1315. Patient pronounced dead by this RN and charge nurse Holli Humbles., time of death 23. Patients spouse notified via phone call. Patients belongings previously sent home with spouse, Bethena Roys.  ?

## 2021-12-15 NOTE — Progress Notes (Signed)
?   2021/12/11 1400  ?Clinical Encounter Type  ?Visited With Patient and family together  ?Visit Type Patient actively dying  ?Referral From Physician  ?Consult/Referral To Chaplain  ?Spiritual Encounters  ?Spiritual Needs Grief support  ? ?Chaplain responded to Spiritual Care Consult to provide support. A member of the family indicated that they were "fine" for now.  ?

## 2021-12-15 NOTE — Progress Notes (Signed)
Noted change to full comfort care. I stopped by and patient was resting comfortably, still intubated. Neurology will continue to be available for any questions, please do not hesitate to call.  ? ?Roland Rack, MD ?[facility] ?[phone] ? ?If 7pm- 7am, please page neurology on call as listed in [place]. ? ?

## 2021-12-15 NOTE — Progress Notes (Signed)
? ?                                                                                                                                                     ?                                                   ?Daily Progress Note  ? ?Patient Name: Michael Cohen       Date: 12-03-21 ?DOB: 05/18/1941  Age: 81 y.o. MRN#: [id number] ?Attending Physician: Flora Lipps, MD ?Primary Care Physician: Kirk Ruths, MD ?Admit Date: 12/01/2021 ? ?Reason for Consultation/Follow-up: Establishing goals of care ? ?Patient Profile/HPI: 81 year old male presenting to [facility] ED from [facility] on 11/22/2021 via EMS.  Per the patient's wife, he had gone out to walk the dog that evening and had fallen in the entryway hitting his head on the corner of the baseboard.  She reported he was altered post fall with complaints of a headache. Patient is not on systemic blood thinners but does take a baby aspirin every other day for heart health.  Wife confirms that the patient drinks 2 scotches daily and sometimes wine with dinner, unclear the exact amount of scotch.  She denies any other recent complaints.  No reports of witnessed seizure-like activity and the patient did confirm that he had drinking alcohol that evening. Head CT indicated large subarachnoid hemorrhage. Patient became agitated and vomited in the ED, then began to desat- was emergently intubated. Repeat head CT revealed new subdural bleed with midline shift. He was taken urgently to OR or craniotomy. Recovery has been complicated by ongoing respiratory failure. He has also had seizure activity on EEG. Palliative medicine consulted for [place].  ? ?Subjective: ?Patient continues to be intubated with no signs of neurological recovery. On 5/14 he had another aspiration event.  ?His spouse and son are at bedside.  ?They have decided to extubate to full comfort measures only. They understand this means removing life supporting machines, medications and other interventions that are  prolonging his life (including stopping tube feedings). Providing comfort measures including medications for symptoms of SOB, pain, agitation, and allowing for natural dying process.  ?They have previously spoken to Santiago Glad with [facility] regarding possible hospice house placement. I discussed with them that I do not believe he will be stable enough to go to hospice house after he is extubated and comfort measure are initiated.  ? ?Review of Systems  ?Unable to perform ROS: Intubated  ? ? ?Physical Exam ?Vitals and nursing note reviewed.  ?Constitutional:   ?   Appearance: He is ill-appearing.  ?Pulmonary:  ?   Comments: Intubated on full support ?Neurological:  ?  Comments: unresponsive  ?         ? ?Vital Signs: BP 124/68   Pulse (!) 109   Temp 99.5 ?F (37.5 ?C)   Resp (!) 28   Ht '5\' 10"'$  (1.778 m)   Wt 89.2 kg   SpO2 (!) 25%   BMI 28.22 kg/m?  ?SpO2: SpO2: (!) 25 % ?O2 Device: O2 Device: Nasal Cannula ?O2 Flow Rate: O2 Flow Rate (L/min): 2 L/min ? ?Intake/output summary:  ?Intake/Output Summary (Last 24 hours) at 12-12-2021 1420 ?Last data filed at 12-12-21 0900 ?Gross per 24 hour  ?Intake 3767.09 ml  ?Output 1540 ml  ?Net 2227.09 ml  ? ?LBM: Last BM Date : 12-Dec-2021 ?Baseline Weight: Weight: 86.2 kg ?Most recent weight: Weight: 89.2 kg ? ?     ?Palliative Assessment/Data: PPS: 10% ? ? ? ? ? ?Patient Active Problem List  ? Diagnosis Date Noted  ? SAH (subarachnoid hemorrhage) ([place]) 12/05/2021  ? Fall at home, initial encounter 12/09/2021  ? Essential hypertension 12/05/2021  ? 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 GERD without esophagitis 12/13/2021  ? ? ?Palliative Care Assessment & Plan  ? ? ?Assessment/Recommendations/Plan ? ?Extubate to full comfort measures only ?Agree with comfort orders placed by PCCM ?PMT will continue to follow along and assist  with EOL symptom management as needed ? ? ?Code Status: ?DNR ? ?Prognosis: ? Hours - Days ? ?Discharge Planning: ?Anticipated  Hospital Death ? ?Care plan was discussed with patient's family and care team ? ?Thank you for allowing the Palliative Medicine Team to assist in the care of this patient. ? ?Total time:  ?Prolonged billing:  ? ?   ?Greater than 50%  of this time was spent counseling and coordinating care related to the above assessment and plan. ? ?Mariana Kaufman, AGNP-C ?Palliative Medicine ? ? ?Please contact Palliative Medicine Team phone at [phone] for questions and concerns.  ? ? ? ? ? ? ?

## 2021-12-15 NOTE — IPAL (Signed)
?  Interdisciplinary Goals of Care Family Meeting ? ? ?Date carried out: 12/18/21 ? ?Location of the meeting: Bedside ? ?Member's involved: Physician, Nurse Practitioner, and Family Member or next of kin (Dr. Leonel Ramsay of neurology present during discussion) ? ?Durable Power of Tour manager: Patient's wife Michael Cohen and patient's son at bedside ? ?Discussion: We discussed goals of care for Michael Cohen .  Discussed lack of improvement with neurological exam despite being off sedation.  Discussed that we are almost 14 days status initial insult, and chance for meaningful neurological recovery are slim.  They understand poor neurological prognosis, stating patient would not want to live like this or be sustained this way.  They request that we transition to comfort measures and withdraw care.  Are interested in pursuing hospice at home. ? ?Code status: Full DNR ? ?Disposition: In-patient comfort care, with consideration of home hospice depending on clinical timeline ? ?Time spent for the meeting: 20 minutes  ? ? ? ? ?Darel Hong, AGACNP-BC ?[facility] ?Prefer epic messenger for cross cover needs ?If after hours, please call E-link ? ? ?Bradly Bienenstock, NP ? ?12-18-2021, 12:40 PM ? ? ?

## 2021-12-15 NOTE — Progress Notes (Signed)
[facility] Liaison Note: ? ?Writer was notified by staff RN Sheliegh to speak with Michael Cohen wife and son Michael Cohen in regards to questions about hospice services. ?Writer met in the room with Michael Cohen and Michael Cohen. Services were explained and questions answered. ?They stated that their plan was for Michael Cohen to be extubated as early as today and if stable enough, they would like him to transfer to the [facility] hospice home in [place]. ?Patient information to be reviewed by hospice physician. Hospice home eligibility pending ? ?At the time of writer's visit, family wanted to speak with attending CCM Michael Cohen and Palliative NP. ? ?TOC Michael Cohen updated. ?Thank youu. ?Flo Shanks BSN, RN, [facility] ?Hospital Liaison ?[phone] ?

## 2021-12-15 NOTE — TOC Progression Note (Signed)
Transition of Care (TOC) - Progression Note  ? ? ?Patient Details  ?Name: Michael Cohen ?MRN: [id number] ?Date of Birth: 1940/10/12 ? ?Transition of Care (TOC) CM/SW Contact  ?Shelbie Hutching, RN ?Phone Number: ?Dec 13, 2021, 12:58 PM ? ?Clinical Narrative:    ?Plan for one way extubation at some point and family is interested in the hospice home if patient is stable enough for transfer.   ?TOC will cont to follow.  ? ? ?Expected Discharge Plan: [place] ?Barriers to Discharge: Continued Medical Work up ? ?Expected Discharge Plan and Services ?Expected Discharge Plan: [place] ?  ?  ?  ?Living arrangements for the past 2 months: [place] ?                ?DME Arranged: N/A ?DME Agency: NA ?  ?  ?  ?HH Arranged: NA ?  ?  ?  ?  ? ? ?Social Determinants of Health (SDOH) Interventions ?  ? ?Readmission Risk Interventions ?   ? View : No data to display.  ?  ?  ?  ? ? ?

## 2021-12-15 NOTE — Progress Notes (Signed)
? ?NAME:  Michael Cohen, MRN:  [id number], DOB:  03-17-1941, LOS: 80 ?ADMISSION DATE:  11/20/2021, CONSULTATION DATE:  11/25/2021 ?REFERRING MD:  Dr. Sidney Ace, CHIEF COMPLAINT: Fall   ? ?History of Present Illness:  ?81 year old male presenting to [facility] ED from [facility] on 11/15/2021 via EMS.  Per the patient's wife, he had gone out to walk the dog that evening and had fallen in the entryway hitting his head on the corner of the baseboard.  She reported he was altered post fall with complaints of a headache. Patient is not on systemic blood thinners but does take a baby aspirin every other day for heart health.  Wife confirms that the patient drinks 2 scotches daily and sometimes wine with dinner, unclear the exact amount of scotch.  She denies any other recent complaints.  No reports of witnessed seizure-like activity and the patient did confirm that he had drinking alcohol that evening. ?ED course: ?Head CT showed large subarachnoid hemorrhage.  Follow-up CTA revealed no aneurysm.  EDP spoke with Dr. Cari Caraway with neurosurgery who initially recommended admitting to hospitalist service with repeat CT in 6 hours.  TRH admitted patient to stepdown unit.  However, patient still boarding in ED when he became agitated attempting to get out of bed and then vomited.  At this point EDP described patient as having gurgling respirations with SPO2 83% on room air.  Patient was emergently intubated requiring mechanical ventilatory support.  Stat repeat head CT revealed new subdural bleed with 7 mm midline shift.  Dr. Cari Caraway from neurosurgery made aware of change, discussed options bedside with wife and the decision was made to take the patient urgently to the OR and transferred directly to ICU. ? ?Medications given: Keppra 1000 mg, mannitol 50 g, IV contrast, fentanyl, 1 L NS, Tdap, banana bag, etomidate, succinylcholine, propofol drip started ? ?Initial Vitals: 97.3 F, 17, 62, 117/67 and SPO2 95% on room air ? ?EKG  Interpretation: Date: 12/06/2021, EKG Time: 23: 55, Rate: 63, Rhythm: First-degree heart block, QRS Axis: Normal,  ?Intervals: First-degree heart block, borderline prolonged QTc, ST/T Wave abnormalities: None, Narrative Interpretation: NSR with first-degree heart block ? ?Significant labs: (Labs/ Imaging personally reviewed) ?Chemistry: Na+:139, K+: 3.3, BUN/Cr.: 8/0.67, Serum CO2/ AG: 24/13 ?Hematology: WBC: 8.2, Hgb: 12.5,  ?Troponin: 6 >5, COVID-19 & Influenza A/B: pending ?ABG: pending ? ?Imaging: ?CXR 11/21/2021: Low lung volumes with probable areas of passive subsegmental atelectasis, as above ?CT head Wo contrast 11/22/2021: Diffuse subarachnoid hemorrhage as described above concentrated predominately in the left sylvian fissure. 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 CTA of the head is ?recommended for further evaluation. Chronic atrophic and ischemic changes are noted ?CT cervical spine 12/07/2021: Degenerative change without acute abnormality ?CT angio head 12/05/2021: No aneurysm, occlusion or high-grade stenosis of the intracranial arteries ?CT head wo contrast 12/09/2021: New subdural hematoma along the majority of the left cerebral convexity measuring up to 1 cm in thickness and primarily responsible for new 7 mm midline shift. Some thickening of subarachnoid hemorrhage although stable pattern. Trace subdural hematoma along the anterior right frontal convexity. Early visualization of cerebral contusion in the bilateral inferior frontal and left temporal lobes. ? ?PCCM consulted due to deterioration requiring emergent intubation and mechanical ventilatory support. ? 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 ? ?Micro data:  ?5/4: SARS-CoV-2 and influenza PCR>> negative ?5/4: MRSA PCR>> negative ?5/7: Tracheal aspirate>>CITROBACTER KOSERI (pansensitive) ?5/8: Tracheal  aspirate>>CITROBACTER KOSERI  ?5/9: MRSA PCR>>  negative ?5/14: Tracheal aspirate>> gram + cocci, gram - rods, gram + rods ? ?Antimicrobials:  ?Unasyn 5/4>> 5/5 ?Azithromycin 5/9 x 1 dose ?Vancomycin 5/9 >>5/11 ?Zosyn 5/9>>5/11 ?Ceftriaxone 5/12>> ? ?Significant Hospital Events: ?Including procedures, antibiotic start and stop dates in addition to other pertinent events   ?5/4: Admit to ICU with worsening SAH after emergent intubation on mechanical ventilatory support with plans to go urgently to OR for neurosurgery ?5/5: s/p craniotomy for TRAUMATIC SAH/subdural hematoma, remains on vent ?5/5: remains on  vent, family updated ?5/6: JP drain removed per Neurosurgery  ?5/6: MRI Brain revealed no ischemic infarction identified. Comparing the MRI to  ?     the most recent CT of earlier today, no change is appreciated. Sequela of   ?     hemorrhagic contusion of both frontal lobes and in the anterior left temporal  ?     lobe. Widespread subarachnoid blood and dependent intraventricular blood with  ?     stable ventricular size. Stable subdural blood along the medial aspect of the  ?     middle cranial fossa on the left, along the falx posteriorly and along the  ?     tentorium. ?5/6: CT Head revealed multifocal intracranial hemorrhage following trauma and  ?      surgical evacuation of left side subdural has not significantly changed since    ?      11/15/2021. Conspicuous cerebral edema throughout the left temporal lobe and ?       in both inferior frontal gyri is favored to be posttraumatic, and is at least in part  ?       due to hemorrhagic cerebral contusions. Mild rightward midline shift of 4-5 mm  ?       has slightly increased, but there is no other progressive intracranial mass effect.  ?       A moderate volume of intraventricular hemorrhage has increased, but ventricle  ?       size and configuration remains stable. ?5/8: Overnight propofol gtt started due to vent asynchrony overnight.  Pt unable to follow commands.   ?5/8: EEG suggestive of cortical  dysfunction in left frontotemporal region likely secondary to underlying structural abnormality as well as moderate to severe diffuse encephalopathy, nonspecific to etiology.  No seizures or definite epileptiform discharges were seen throughout the recording. ?5/9: Propofol gtt stopped for [facility] neurological exam remains unchanged unable to follow commands.  Pt with increased work of breathing when sedation stopped.  Will diurese with 40 mg iv lasix x1 dose currently 2L positive  ?5/10: Patient with recurrent right lip/mouth twitching concerning for seizures.  EEG pending.  MRI with enlarging ventricles but otherwise no major changes from 5/6 MRI. Neurosurgery does not recommend intervening on enlarged ventricles at this time.  Consult neurology.  PICC placed due to lack of IV access ?5/11: EEG without seizure activity.  Leukocytosis slowly improving, fever resolved.  Diurese with Lasix x1 dose.  ABX deescalated to Rocephin. ?5/12: Transition from Propofol to Versed due to Hypertriglyceridemia.  Lasix 40 mg x1. ?5/13: Lasix 40 mg iv x1 dose.  Tolerating SBT 12/5 ?5/14: aspiration event overnight.  Repeat CTH unchanged ?5/15: On no sedation, Neuro remains poor, unchanged.  Family considering 1 way extubation/transition to comfort measures in next few days ? ?Interim History / Subjective:  ?-Repeat CTH yesterday with no acute changes ?-No significant events noted overnight ?-Afebrile, hemodynamically stable, no vasopressors ?-Preliminary Tracheal  aspirate from 5/14 (aspiration event) with gram + cocci, gram - & + rods ~ continues on Ceftriaxone for now ?-On NO SEDATION, Neuro exam remains poor, this morning only able to elicit withdrawal on Left UE/LE, nothing on right side  ?-Mild Hypernatremia 148 (145), will avoid D5W given persistent/unchanged edema on [facility]  ?-Renal function remains stable, UOP 990 cc last 24 hrs (approximately net even fluid balance) ?-Leukocytosis improving to 19.8 K (25.6 K) ?-Family seems to be  leaning towards 1 way extubation/transition to comfort measures in the next few days ~ interested in hospice services ~ Palliative Care following ? ?Objective   ?Blood pressure 122/66, pulse 88, temperature 97.9 ?F (36.6 ?C),

## 2021-12-15 NOTE — Progress Notes (Signed)
? ? Attending Progress Note ? ?History: Michael Cohen is a 81 y.o presenting after a fall presenting to the ER with confusion and headache. Was found to have [place] with left sided SDH. S/p left craniotomy for evacuation on 12/05/2021.  ? ?POD11: Continues to have poor exam. ? ?POD9: Sedation off this morning, exam not improved. Continues on multiple AEDs ? ?POD8: attempting to wean sedation.  ? ?POD7: Pt remains on sedation. EEG yesterday was negative for subclinical status. ? ?POD6: pt less responsive this morning. Right facial twitching present this am. ? ?POD5: Continues to require intermittent sedation overnight. No significant changes.  ? ?POD4:requiring increased sedation overnight due to vent asynchrony.   ? ?POD3: Remains intubated.  Is more interactive today. ? ?POD2: Pt remains intubated.  He moves BUE and BLE more briskly on the L.  He is currently heavily sedated. ? ?POD1: Pt remains in the ICU on sedation, pressures, and intubated. Neurologically stable. Incision was bloody drainage yesterday evening. Was reinforced with additional staples without any continued issue.  ? ?Physical Exam: ?Vitals:  ? 29-Nov-2021 0500 29-Nov-2021 0600  ?BP: 121/63 122/66  ?Pulse: 81 88  ?Resp: 20 19  ?Temp: (!) 97.5 ?F (36.4 ?C) 97.9 ?F (36.6 ?C)  ?SpO2: 93% 96%  ? ?Remains intubated  ?OE to stim but does not regard ?PERRL. + corneals, cough ?Facial grimace to stimuli ?Localize LUE, no movement right to stimuli ?Incision c/d/I ? ?Data: ? ?Recent Labs  ?Lab 11/26/21 ?0412 11/27/21 ?0438 Nov 29, 2021 ?6546  ?NA 143 145 148*  ?K 3.9 3.4* 3.8  ?CL 105 101 109  ?CO2 32 34* 35*  ?BUN 22 25* 28*  ?CREATININE 0.54* 0.47* 0.53*  ?GLUCOSE 223* 74 156*  ?CALCIUM 8.6* 9.0 8.7*  ? ?Recent Labs  ?Lab 11/27/21 ?5035  ?AST 57*  ?ALT 70*  ?ALKPHOS 108  ?  ? Recent Labs  ?Lab 11/26/21 ?0412 11/27/21 ?0438 2021/11/29 ?4656  ?WBC 15.4* 25.6* 19.8*  ?HGB 9.3* 9.7* 8.4*  ?HCT 30.7* 32.5* 28.5*  ?PLT 331 368 322  ? ?No results for input(s): APTT, INR in the  last 168 hours. ?  ?   ? ? ?Other tests/results:  ? ?MRI brain 11/23/21 ?IMPRESSION: ?1. No definite empyema or other evidence of intracranial infection. ?2. Interval enlargement of the lateral and third ventricles, ?concerning for hydrocephalus. ?3. Otherwise stable compared to the 11/19/2021 MRI, with ?redemonstrated hemorrhagic contusions in the bilateral frontal lobes ?and anterior left temporal lobe, subdural, and subarachnoid ?hemorrhage, which are now more apparent given evolving blood ?products. ?  ?  ?Electronically Signed ?  By: Merilyn Baba M.D. ?  On: 11/23/2021 15:25 ?  ? ?Head CT 12/12/2021 (post-op) ?IMPRESSION: ?1. Status post left convexity subdural hematoma evacuation with ?resolution of midline shift. ?2. Residual mixed subdural and subarachnoid blood surrounding the ?cerebellum and over the left convexity, but the dominant component ?of the subdural hematoma has markedly decreased. ?  ?  ?Electronically Signed ?  By: Ulyses Jarred M.D. ?  On: 12/02/2021 19:03 ? ?MRI Brain 11/19/21 ?IMPRESSION: ?No ischemic infarction identified. Comparing the MRI to the most ?recent CT of earlier today, no change is appreciated. Sequela of ?hemorrhagic contusion of both frontal lobes and in the anterior left ?temporal lobe. Widespread subarachnoid blood and dependent ?intraventricular blood with stable ventricular size. Stable subdural ?blood along the medial aspect of the middle cranial fossa on the ?left, along the falx posteriorly and along the tentorium. ?  ?  ?Electronically Signed ?  By: Nelson Chimes M.D. ?  On: 11/19/2021 16:46 ? ?Assessment/Plan: ? ?Michael Cohen is a 81 y.o presenting after a fall resulting in tSAH and SDH s/p craniotomy for SDH evacuation on 12/08/2021. ? ?- EEG negative for underlying seizure activity.  ?- Continue supportive care per ICU ?- OK for [facility] for Dvt PPX ? ?Meade Maw MD ?Department of Neurosurgery ? ?  ?

## 2021-12-15 DEATH — deceased

## 2021-12-19 ENCOUNTER — Ambulatory Visit: Payer: Medicare Other | Admitting: Physical Therapy

## 2021-12-21 ENCOUNTER — Ambulatory Visit: Payer: Medicare Other

## 2021-12-26 ENCOUNTER — Ambulatory Visit: Payer: Medicare Other | Admitting: Physical Therapy

## 2021-12-28 ENCOUNTER — Ambulatory Visit: Payer: Medicare Other | Admitting: Physical Therapy

## 2022-01-02 ENCOUNTER — Ambulatory Visit: Payer: Medicare Other | Admitting: Physical Therapy

## 2022-01-04 ENCOUNTER — Ambulatory Visit: Payer: Medicare Other

## 2022-01-09 ENCOUNTER — Ambulatory Visit: Payer: Medicare Other

## 2022-01-11 ENCOUNTER — Ambulatory Visit: Payer: Medicare Other

## 2022-01-23 ENCOUNTER — Ambulatory Visit: Payer: Medicare Other

## 2022-01-25 ENCOUNTER — Ambulatory Visit: Payer: Medicare Other

## 2022-01-30 ENCOUNTER — Ambulatory Visit: Payer: Medicare Other

## 2022-02-01 ENCOUNTER — Ambulatory Visit: Payer: Medicare Other

## 2022-02-06 ENCOUNTER — Ambulatory Visit: Payer: Medicare Other

## 2022-02-08 ENCOUNTER — Ambulatory Visit: Payer: Medicare Other

## 2022-02-13 ENCOUNTER — Ambulatory Visit: Payer: Medicare Other

## 2022-02-15 ENCOUNTER — Ambulatory Visit: Payer: Medicare Other

## 2022-02-20 ENCOUNTER — Ambulatory Visit: Payer: Medicare Other

## 2022-02-22 ENCOUNTER — Ambulatory Visit: Payer: Medicare Other

## 2022-02-27 ENCOUNTER — Ambulatory Visit: Payer: Medicare Other

## 2022-03-01 ENCOUNTER — Ambulatory Visit: Payer: Medicare Other

## 2022-03-06 ENCOUNTER — Ambulatory Visit: Payer: Medicare Other

## 2022-03-08 ENCOUNTER — Ambulatory Visit: Payer: Medicare Other

## 2022-03-13 ENCOUNTER — Ambulatory Visit: Payer: Medicare Other

## 2022-03-15 ENCOUNTER — Ambulatory Visit: Payer: Medicare Other
